# Patient Record
Sex: Female | Born: 1950 | Race: White | Hispanic: No | Smoking: Former smoker
Health system: Southern US, Community
[De-identification: ages and names within clinical notes are randomized; demographics above are authoritative.]

## PROBLEM LIST (undated history)

## (undated) DIAGNOSIS — T4145XA Adverse effect of unspecified anesthetic, initial encounter: Secondary | ICD-10-CM

## (undated) DIAGNOSIS — C50912 Malignant neoplasm of unspecified site of left female breast: Secondary | ICD-10-CM

## (undated) DIAGNOSIS — I10 Essential (primary) hypertension: Secondary | ICD-10-CM

## (undated) DIAGNOSIS — E785 Hyperlipidemia, unspecified: Secondary | ICD-10-CM

## (undated) DIAGNOSIS — C801 Malignant (primary) neoplasm, unspecified: Secondary | ICD-10-CM

## (undated) DIAGNOSIS — F329 Major depressive disorder, single episode, unspecified: Secondary | ICD-10-CM

## (undated) DIAGNOSIS — Z9221 Personal history of antineoplastic chemotherapy: Secondary | ICD-10-CM

## (undated) DIAGNOSIS — J449 Chronic obstructive pulmonary disease, unspecified: Secondary | ICD-10-CM

## (undated) DIAGNOSIS — C50919 Malignant neoplasm of unspecified site of unspecified female breast: Secondary | ICD-10-CM

## (undated) DIAGNOSIS — K219 Gastro-esophageal reflux disease without esophagitis: Secondary | ICD-10-CM

## (undated) DIAGNOSIS — Z923 Personal history of irradiation: Secondary | ICD-10-CM

## (undated) DIAGNOSIS — R110 Nausea: Secondary | ICD-10-CM

## (undated) DIAGNOSIS — N189 Chronic kidney disease, unspecified: Secondary | ICD-10-CM

## (undated) HISTORY — DX: Malignant neoplasm of unspecified site of left female breast: C50.912

## (undated) HISTORY — PX: BACK SURGERY: SHX140

## (undated) HISTORY — PX: OOPHORECTOMY: SHX86

## (undated) HISTORY — DX: Gastro-esophageal reflux disease without esophagitis: K21.9

## (undated) HISTORY — PX: BREAST BIOPSY: SHX20

## (undated) HISTORY — DX: Hyperlipidemia, unspecified: E78.5

## (undated) HISTORY — PX: CHOLECYSTECTOMY: SHX55

## (undated) HISTORY — DX: Essential (primary) hypertension: I10

## (undated) HISTORY — PX: TONSILLECTOMY: SUR1361

## (undated) HISTORY — PX: ABDOMINAL HYSTERECTOMY: SHX81

## (undated) HISTORY — DX: Major depressive disorder, single episode, unspecified: F32.9

## (undated) HISTORY — DX: Malignant (primary) neoplasm, unspecified: C80.1

---

## 1993-03-10 DIAGNOSIS — F32A Depression, unspecified: Secondary | ICD-10-CM

## 1993-03-10 HISTORY — DX: Depression, unspecified: F32.A

## 2001-06-09 ENCOUNTER — Other Ambulatory Visit: Admission: RE | Admit: 2001-06-09 | Discharge: 2001-06-09 | Payer: Self-pay | Admitting: Obstetrics & Gynecology

## 2001-11-19 ENCOUNTER — Other Ambulatory Visit: Admission: RE | Admit: 2001-11-19 | Discharge: 2001-11-19 | Payer: Self-pay | Admitting: Obstetrics & Gynecology

## 2001-12-21 ENCOUNTER — Ambulatory Visit (HOSPITAL_COMMUNITY): Admission: RE | Admit: 2001-12-21 | Discharge: 2001-12-21 | Payer: Self-pay | Admitting: Obstetrics & Gynecology

## 2001-12-27 ENCOUNTER — Encounter: Payer: Self-pay | Admitting: Obstetrics & Gynecology

## 2001-12-27 ENCOUNTER — Ambulatory Visit (HOSPITAL_COMMUNITY): Admission: RE | Admit: 2001-12-27 | Discharge: 2001-12-27 | Payer: Self-pay | Admitting: Obstetrics & Gynecology

## 2002-03-10 DIAGNOSIS — T8859XA Other complications of anesthesia, initial encounter: Secondary | ICD-10-CM

## 2002-03-10 HISTORY — DX: Other complications of anesthesia, initial encounter: T88.59XA

## 2003-08-02 ENCOUNTER — Other Ambulatory Visit: Admission: RE | Admit: 2003-08-02 | Discharge: 2003-08-02 | Payer: Self-pay | Admitting: Obstetrics and Gynecology

## 2004-04-23 ENCOUNTER — Ambulatory Visit (HOSPITAL_COMMUNITY): Admission: RE | Admit: 2004-04-23 | Discharge: 2004-04-23 | Payer: Self-pay | Admitting: Obstetrics & Gynecology

## 2004-12-05 ENCOUNTER — Other Ambulatory Visit: Admission: RE | Admit: 2004-12-05 | Discharge: 2004-12-05 | Payer: Self-pay | Admitting: Unknown Physician Specialty

## 2005-02-01 ENCOUNTER — Emergency Department (HOSPITAL_COMMUNITY): Admission: EM | Admit: 2005-02-01 | Discharge: 2005-02-01 | Payer: Self-pay | Admitting: Emergency Medicine

## 2008-05-17 ENCOUNTER — Ambulatory Visit: Payer: Self-pay

## 2009-05-22 ENCOUNTER — Ambulatory Visit: Payer: Self-pay | Admitting: Internal Medicine

## 2009-05-24 ENCOUNTER — Ambulatory Visit: Payer: Self-pay | Admitting: Family Medicine

## 2009-09-14 ENCOUNTER — Ambulatory Visit: Payer: Self-pay | Admitting: Unknown Physician Specialty

## 2009-09-17 LAB — PATHOLOGY REPORT

## 2009-12-11 ENCOUNTER — Ambulatory Visit: Payer: Self-pay | Admitting: Surgery

## 2010-03-30 ENCOUNTER — Encounter: Payer: Self-pay | Admitting: Obstetrics & Gynecology

## 2010-04-03 ENCOUNTER — Ambulatory Visit: Payer: Self-pay | Admitting: Family Medicine

## 2010-04-14 ENCOUNTER — Ambulatory Visit: Payer: Self-pay | Admitting: Family Medicine

## 2010-06-04 ENCOUNTER — Ambulatory Visit: Payer: Self-pay | Admitting: Surgery

## 2011-03-11 HISTORY — PX: COLONOSCOPY: SHX174

## 2011-06-19 ENCOUNTER — Ambulatory Visit: Payer: Self-pay | Admitting: Family Medicine

## 2012-06-23 ENCOUNTER — Ambulatory Visit: Payer: Self-pay | Admitting: Family Medicine

## 2013-03-10 HISTORY — PX: OTHER SURGICAL HISTORY: SHX169

## 2014-03-10 DIAGNOSIS — Z923 Personal history of irradiation: Secondary | ICD-10-CM

## 2014-03-10 DIAGNOSIS — C50919 Malignant neoplasm of unspecified site of unspecified female breast: Secondary | ICD-10-CM

## 2014-03-10 HISTORY — PX: REDUCTION MAMMAPLASTY: SUR839

## 2014-03-10 HISTORY — DX: Malignant neoplasm of unspecified site of unspecified female breast: C50.919

## 2014-03-10 HISTORY — PX: BREAST LUMPECTOMY: SHX2

## 2014-03-10 HISTORY — DX: Personal history of irradiation: Z92.3

## 2014-06-27 ENCOUNTER — Ambulatory Visit: Admit: 2014-06-27 | Disposition: A | Discharge status: Home / Self Care | Payer: Self-pay | Admitting: Family Medicine

## 2014-06-27 ENCOUNTER — Encounter: Payer: Self-pay | Admitting: Family Medicine

## 2014-07-04 ENCOUNTER — Ambulatory Visit
Admit: 2014-07-04 | Disposition: A | Discharge status: Home / Self Care | Payer: Self-pay | Attending: Family Medicine | Admitting: Family Medicine

## 2014-07-05 ENCOUNTER — Ambulatory Visit
Admit: 2014-07-05 | Disposition: A | Discharge status: Home / Self Care | Payer: Self-pay | Attending: Family Medicine | Admitting: Family Medicine

## 2014-07-05 ENCOUNTER — Other Ambulatory Visit: Payer: Self-pay

## 2014-07-06 LAB — SURGICAL PATHOLOGY

## 2014-07-11 ENCOUNTER — Encounter: Payer: Self-pay | Admitting: General Surgery

## 2014-07-11 ENCOUNTER — Other Ambulatory Visit: Payer: BLUE CROSS/BLUE SHIELD

## 2014-07-11 ENCOUNTER — Ambulatory Visit (INDEPENDENT_AMBULATORY_CARE_PROVIDER_SITE_OTHER): Payer: BLUE CROSS/BLUE SHIELD | Admitting: General Surgery

## 2014-07-11 VITALS — BP 124/80 | HR 78 | Resp 12 | Ht 65.0 in | Wt 218.0 lb

## 2014-07-11 DIAGNOSIS — N6320 Unspecified lump in the left breast, unspecified quadrant: Secondary | ICD-10-CM

## 2014-07-11 DIAGNOSIS — N63 Unspecified lump in breast: Secondary | ICD-10-CM | POA: Diagnosis not present

## 2014-07-11 NOTE — Progress Notes (Signed)
Patient ID: Madison Christensen, female   DOB: February 22, 1951, 64 y.o.   MRN: [id number]  Chief Complaint  Patient presents with  . Other    Breast cancer    HPI Madison Christensen is a 64 y.o. female who presents for a breast evaluation. The most recent mammogram was done on 06/27/14 added views on 07/04/14  and left breast core biopsy done on 07/05/14. Patient does perform regular self breast checks and gets regular mammograms done.  She did not have mammograms in 2013/05/30 as her husband died in April 29, 2022 of that year after a 4/2 year back to was metastatic lung cancer.  The patient is accompanied today by her sister, Madison Christensen and her son, Madison Christensen.    HPI  Past Medical History  Diagnosis Date  . Hypertension   . Hyperlipidemia   . Depression   . GERD (gastroesophageal reflux disease)   . Cancer     skin     Past Surgical History  Procedure Laterality Date  . Back surgery    . Tonsillectomy    . Skin cancer  removal  30-May-2013    nose   . Colonoscopy  05-31-2011    Dr. Vira Agar    Family History  Problem Relation Age of Onset  . Breast cancer Mother 36       . Breast cancer Cousin 68         Social History History  Substance Use Topics  . Smoking status: Former Smoker -- 1.00 packs/day for 2 years    Types: Cigarettes  . Smokeless tobacco: Not on file  . Alcohol Use: No    Allergies  Allergen Reactions  . Sulfa Antibiotics Hives    Current Outpatient Prescriptions  Medication Sig Dispense Refill  . cetirizine (ZYRTEC) 10 MG tablet Take 10 mg by mouth at bedtime.  3  . KLOR-CON M20 20 MEQ tablet Take 20 mEq by mouth daily.  4  . omeprazole (PRILOSEC) 40 MG capsule Take 40 mg by mouth daily.  4  . pravastatin (PRAVACHOL) 40 MG tablet Take 40 mg by mouth at bedtime.  3  . sertraline (ZOLOFT) 100 MG tablet Take 150 mg by mouth daily.  4  . triamterene-hydrochlorothiazide (MAXZIDE) 75-50 MG per tablet Take 1 tablet by mouth daily.  4   No current facility-administered  medications for this visit.    Review of Systems Review of Systems  Constitutional: Negative.   Respiratory: Negative.   Cardiovascular: Negative.     Blood pressure 124/80, pulse 78, resp. rate 12, height 5' 5"  (1.651 m), weight 98.884 kg (218 lb).  Physical Exam Physical Exam  Constitutional: She is oriented to person, place, and time. She appears well-developed and well-nourished.  Eyes: Conjunctivae are normal. No scleral icterus.  Neck: Neck supple.  Cardiovascular: Normal rate, regular rhythm and normal heart sounds.   Pulmonary/Chest: Effort normal and breath sounds normal. Right breast exhibits no inverted nipple, no mass, no nipple discharge, no skin change and no tenderness. Left breast exhibits no inverted nipple, no nipple discharge, no skin change and no tenderness.    Thickening at 11 o'clock left breast   Lymphadenopathy:    She has no cervical adenopathy.    She has no axillary adenopathy.  Neurological: She is alert and oriented to person, place, and time.  Skin: Skin is warm and dry.    Data Reviewed Ultrasound examination of the left breast was done to assess for the ability to locate the biopsy  site for wide excision. At the 11:00 position, 10 cm from the nipple a hypoechoic mass measuring 0.84 x 1.05 x 1.12 with evidence of a marking clip  is identified corresponding to the biopsy site.  Biopsy results showed invasive ductal carcinoma, no special type, grade 3. Lymphovascular involvement was noted. ER negative, PR negative, HER-2/neu overexpressing. ( 3+).  Screening mammograms dated 06/27/2014 showed a new density in the upper inner aspect of the left breast.  Focal spot compression views and ultrasound dated 07/04/2014 showed a persistent density and ultrasound showed a 8 x 7 x 10 mm abnormality for which biopsy was recommended. BI-RADS-5.  Biopsy was completed 07/05/2014.  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     Plan    The patient has tripled D breasts, and started out the conversation with the idea of bilateral mastectomy. It was explained that contralateral mastectomy adds no survival advantage. She appreciated that were surgery to be done on one side some sort of balancing procedure would be needed on the contralateral side.  Breast conservation and mastectomy were presented as equivalent procedures. The role for plastic surgery involvement was discussed. Considering her small tumor size and likelihood that she will benefit from adjuvant chemotherapy and targeted HER-2/neu therapy, I would advise against a combined procedure on both breast, rather focusing on the left breast, likely considering wide excision with immediate breast reduction by the plastic surgery service to minimize delay in initiating adjuvant therapy.  The patient is amenable to meet with plastic surgery service and medical oncology assessment further determination of role of adjuvant therapy based on the ER/PR negative tumor and evidence of lymphovascular invasion.  The patient was offered the opportunity for second surgical opinion locally or at the [place] level. (Her husband was cared [facility] at [facility] with his metastatic lung cancer) .  She'll be contacted regarding evaluation times with the above-mentioned subspecialists services.     PCP:  Cranford Mon, Richard  Ref. [address]  Hervey Ard W 07/12/2014, 6:43 AM

## 2014-07-12 ENCOUNTER — Telehealth: Payer: Self-pay | Admitting: *Deleted

## 2014-07-12 ENCOUNTER — Encounter: Payer: Self-pay | Admitting: General Surgery

## 2014-07-12 DIAGNOSIS — N6320 Unspecified lump in the left breast, unspecified quadrant: Secondary | ICD-10-CM | POA: Insufficient documentation

## 2014-07-12 DIAGNOSIS — C50912 Malignant neoplasm of unspecified site of left female breast: Secondary | ICD-10-CM

## 2014-07-12 NOTE — Telephone Encounter (Signed)
-----   Message from Robert Bellow, MD sent at 07/11/2014  9:32 PM EDT ----- Please arrange evaluation w/ Dr. Tula Nakayama re: breast reduction during wide excision, SLN biopsy. Please arrange for evaluation w medical oncology re: post surgery treatment for her 2 neu positive tumor (stage 1) Thanks.

## 2014-07-12 NOTE — Telephone Encounter (Signed)
Patient called the office back regarding her appointments. She is aware that [place] office will be in touch with her. Patient also requested to see Dr.Choksi, so I gave her the number to call and get that changed.

## 2014-07-12 NOTE — Telephone Encounter (Signed)
Message left on home, work, and cell numbers for patient to call the office.  Records forwarded to Dr. Edwin Dada office and received per Roselyn Reef. Their office will be in contact with patient regarding appointment.   Patient has been scheduled for an appointment with Dr. Ma Hillock at the [facility] for 07-19-14 at 11 am. Their office will be mailing out new patient paperwork for her to complete and bring to appointment.

## 2014-07-19 ENCOUNTER — Encounter (INDEPENDENT_AMBULATORY_CARE_PROVIDER_SITE_OTHER): Payer: Self-pay

## 2014-07-19 ENCOUNTER — Ambulatory Visit: Payer: BLUE CROSS/BLUE SHIELD | Admitting: Internal Medicine

## 2014-07-19 ENCOUNTER — Inpatient Hospital Stay: Payer: BLUE CROSS/BLUE SHIELD | Attending: Oncology | Admitting: Oncology

## 2014-07-19 VITALS — BP 122/80 | HR 73 | Temp 96.4°F | Ht 65.0 in | Wt 220.0 lb

## 2014-07-19 DIAGNOSIS — Z171 Estrogen receptor negative status [ER-]: Secondary | ICD-10-CM | POA: Diagnosis not present

## 2014-07-19 DIAGNOSIS — F419 Anxiety disorder, unspecified: Secondary | ICD-10-CM | POA: Insufficient documentation

## 2014-07-19 DIAGNOSIS — C50912 Malignant neoplasm of unspecified site of left female breast: Secondary | ICD-10-CM | POA: Diagnosis present

## 2014-07-19 DIAGNOSIS — F17210 Nicotine dependence, cigarettes, uncomplicated: Secondary | ICD-10-CM

## 2014-07-19 DIAGNOSIS — K219 Gastro-esophageal reflux disease without esophagitis: Secondary | ICD-10-CM | POA: Insufficient documentation

## 2014-07-19 DIAGNOSIS — E785 Hyperlipidemia, unspecified: Secondary | ICD-10-CM

## 2014-07-19 DIAGNOSIS — I10 Essential (primary) hypertension: Secondary | ICD-10-CM | POA: Insufficient documentation

## 2014-07-19 DIAGNOSIS — Z85828 Personal history of other malignant neoplasm of skin: Secondary | ICD-10-CM | POA: Diagnosis not present

## 2014-07-19 DIAGNOSIS — Z803 Family history of malignant neoplasm of breast: Secondary | ICD-10-CM

## 2014-07-22 DIAGNOSIS — E876 Hypokalemia: Secondary | ICD-10-CM | POA: Insufficient documentation

## 2014-07-22 DIAGNOSIS — F32A Depression, unspecified: Secondary | ICD-10-CM | POA: Insufficient documentation

## 2014-07-22 DIAGNOSIS — F329 Major depressive disorder, single episode, unspecified: Secondary | ICD-10-CM | POA: Insufficient documentation

## 2014-07-27 ENCOUNTER — Telehealth: Payer: Self-pay | Admitting: *Deleted

## 2014-07-27 DIAGNOSIS — C50912 Malignant neoplasm of unspecified site of left female breast: Secondary | ICD-10-CM

## 2014-07-27 NOTE — Telephone Encounter (Signed)
Patient was contacted today. She confirms that she wants Dr. Bary Castilla to complete her breast surgery. Also, confirms that she wants a left breast lumpectomy with SLN biopsy.   This patient's surgery has been scheduled for 08-03-14 at [facility].

## 2014-07-29 ENCOUNTER — Other Ambulatory Visit: Payer: Self-pay | Admitting: General Surgery

## 2014-07-29 DIAGNOSIS — C50912 Malignant neoplasm of unspecified site of left female breast: Secondary | ICD-10-CM

## 2014-07-29 NOTE — H&P (Signed)
Patient ID: Madison Christensen, female DOB: 1950-06-15, 64 y.o. MRN: [id number]  Chief Complaint  Patient presents with  . Other    Breast cancer    HPI ASIANNA Christensen is a 64 y.o. female who presents for a breast evaluation. The most recent mammogram was done on 06/27/14 added views on 07/04/14 and left breast core biopsy done on 07/05/14. Patient does perform regular self breast checks and gets regular mammograms done. She did not have mammograms in 2013/05/06 as her husband died in Apr 05, 2022 of that year after a 4/2 year back to was metastatic lung cancer.  The patient is accompanied today by her sister, Madison Christensen and her son, Madison Christensen.   HPI  Past Medical History  Diagnosis Date  . Hypertension   . Hyperlipidemia   . Depression   . GERD (gastroesophageal reflux disease)   . Cancer     skin     Past Surgical History  Procedure Laterality Date  . Back surgery    . Tonsillectomy    . Skin cancer removal  05/06/13    nose   . Colonoscopy  May 07, 2011    Dr. Vira Agar    Family History  Problem Relation Age of Onset  . Breast cancer Mother 73      . Breast cancer Cousin 47        Social History History  Substance Use Topics  . Smoking status: Former Smoker -- 1.00 packs/day for 2 years    Types: Cigarettes  . Smokeless tobacco: Not on file  . Alcohol Use: No    Allergies  Allergen Reactions  . Sulfa Antibiotics Hives    Current Outpatient Prescriptions  Medication Sig Dispense Refill  . cetirizine (ZYRTEC) 10 MG tablet Take 10 mg by mouth at bedtime.  3  . KLOR-CON M20 20 MEQ tablet Take 20 mEq by mouth daily.  4  . omeprazole (PRILOSEC) 40 MG capsule Take 40 mg by mouth daily.  4  . pravastatin (PRAVACHOL) 40 MG tablet Take 40 mg by mouth at bedtime.  3  . sertraline (ZOLOFT) 100 MG tablet Take 150 mg by mouth daily.  4    . triamterene-hydrochlorothiazide (MAXZIDE) 75-50 MG per tablet Take 1 tablet by mouth daily.  4   No current facility-administered medications for this visit.    Review of Systems Review of Systems  Constitutional: Negative.  Respiratory: Negative.  Cardiovascular: Negative.    Blood pressure 124/80, pulse 78, resp. rate 12, height 5' 5"  (1.651 m), weight 98.884 kg (218 lb).  Physical Exam Physical Exam  Constitutional: She is oriented to person, place, and time. She appears well-developed and well-nourished.  Eyes: Conjunctivae are normal. No scleral icterus.  Neck: Neck supple.  Cardiovascular: Normal rate, regular rhythm and normal heart sounds.  Pulmonary/Chest: Effort normal and breath sounds normal. Right breast exhibits no inverted nipple, no mass, no nipple discharge, no skin change and no tenderness. Left breast exhibits no inverted nipple, no nipple discharge, no skin change and no tenderness.    Thickening at 11 o'clock left breast  Lymphadenopathy:   She has no cervical adenopathy.   She has no axillary adenopathy.  Neurological: She is alert and oriented to person, place, and time.  Skin: Skin is warm and dry.    Data Reviewed Ultrasound examination of the left breast was done to assess for the ability to locate the biopsy site for wide excision. At the 11:00 position, 10 cm from the nipple a hypoechoic mass  measuring 0.84 x 1.05 x 1.12 with evidence of a marking clip is identified corresponding to the biopsy site.  Biopsy results showed invasive ductal carcinoma, no special type, grade 3. Lymphovascular involvement was noted. ER negative, PR negative, HER-2/neu overexpressing. ( 3+).  Screening mammograms dated 06/27/2014 showed a new density in the upper inner aspect of the left breast.  Focal spot compression views and ultrasound dated 07/04/2014 showed a persistent density and ultrasound showed a 8 x 7 x 10 mm abnormality for which biopsy was  recommended. BI-RADS-5.  Biopsy was completed 07/05/2014.  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    Plan    The patient has tripled D breasts, and started out the conversation with the idea of bilateral mastectomy. It was explained that contralateral mastectomy adds no survival advantage. She appreciated that were surgery to be done on one side some sort of balancing procedure would be needed on the contralateral side.  Breast conservation and mastectomy were presented as equivalent procedures. The role for plastic surgery involvement was discussed. Considering her small tumor size and likelihood that she will benefit from adjuvant chemotherapy and targeted HER-2/neu therapy, I would advise against a combined procedure on both breast, rather focusing on the left breast, likely considering wide excision with immediate breast reduction by the plastic surgery service to minimize delay in initiating adjuvant therapy.  The patient is amenable to meet with plastic surgery service and medical oncology assessment further determination of role of adjuvant therapy based on the ER/PR negative tumor and evidence of lymphovascular invasion.  The patient was offered the opportunity for second surgical opinion locally or at the [place] level. (Her husband was cared for at [facility] with his metastatic lung cancer) .  She'll be contacted regarding evaluation times with the above-mentioned subspecialists services.        The patient met with the plastic surgery service on 07/27/2014. Plans for wide excision, sentinel node biopsy for confirmation of negative margins followed by reduction mammoplasty if chemotherapy is not indicated a sparse planned at present. She is a ER/PR negative tumor, HER-2/neu over expressed. In discussion with Blain Pais, M.D. from medical oncology she would be a candidate for adjuvant chemotherapy if the tumor is greater than 1 cm in diameter  based on the HER-2/neu status.

## 2014-07-30 ENCOUNTER — Encounter: Payer: Self-pay | Admitting: Oncology

## 2014-07-30 DIAGNOSIS — C50912 Malignant neoplasm of unspecified site of left female breast: Secondary | ICD-10-CM

## 2014-07-30 HISTORY — DX: Malignant neoplasm of unspecified site of left female breast: C50.912

## 2014-07-30 NOTE — Progress Notes (Signed)
[place] @ [facility] [phone]  Fax:([phone]) [place] OB: [birth year]  MR#: [id number]  ZHG#:[id number]  Patient Care Team: Janine Ores Cranford Mon., MD as PCP - General (Family Medicine) Robert Bellow, MD as Consulting Physician (General Surgery) Nicholaus Bloom, MD as Consulting Physician (Plastic Surgery) Leonie Green, MD as Consulting Physician (Surgery)  CHIEF COMPLAINT:  Chief Complaint  Patient presents with  . New Evaluation    VISIT DIAGNOSIS:     ICD-9-CM ICD-10-CM   1. Carcinoma of left breast 174.9 C50.912      Oncology History   1.  Abnormal mammogram in April of 2016 followed by ultrasound and based on clinical examination 1 cm tumor.  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  (July 05, 2014)     Carcinoma of left breast   07/05/2014 Initial Diagnosis Carcinoma of left breast    No flowsheet data found.  INTERVAL HISTORY:    Madison Christensen is a 64 y.o. female who presents for a breast evaluation. The most recent mammogram was done on 06/27/14 added views on 07/04/14 and left breast core biopsy done on 07/05/14. Patient does perform regular self breast checks and gets regular mammograms done. She did not have mammograms in May 23, 2013 as her husband died in Apr 22, 2022 of that year after a 4/2 year back to was metastatic lung cancer. Patient underwent ultrasound followed by core biopsy and was positive for invasive mammary carcinoma which was estrogen progesterone receptor negative.  And HER-2/neu positive by IHC.  Patient has been evaluated by Dr. Rochel Brome and by Dr. Bary Castilla.  An appointment with Dr. Tula Nakayama  was pending. Patient desires bilateral mastectomy right away with reconstructive surgery.  And extremely anxious. Here for further evaluation. REVIEW OF SYSTEM: Gen. status: Patient is in good performance status.  But  extremely apprehensive and agitated. HEENT: No headache no dizziness.  No difficulty swallowing. Lungs: No cough or shortness of breath Cardiac: No chest pain.  No paroxysmal nocturnal dyspnea GI:  no nausea no vomiting no diarrhea . Musculoskeletal system: No bony pain skin: No rash Neurological system: No headache no dizziness.  No other focal symptoms GU: No dysuria or hematuria All other systems have been reviewed As per HPI. Otherwise, a complete review of systems is negatve.  PAST MEDICAL HISTORY: Past Medical History  Diagnosis Date  . Hypertension   . Hyperlipidemia   . Depression   . GERD (gastroesophageal reflux disease)   . Cancer     skin   . Carcinoma of left breast 07/30/2014    PAST SURGICAL HISTORY: Past Surgical History  Procedure Laterality Date  . Back surgery    . Tonsillectomy    . Skin cancer  removal  05-23-2013    nose   . Colonoscopy  May 24, 2011    Dr. Vira Agar    FAMILY HISTORY Family History  Problem Relation Age of Onset  . Breast cancer Mother 50       . Breast cancer Cousin 62             ADVANCED DIRECTIVES: Patient does not have advance care directive   HEALTH MAINTENANCE: History  Substance Use Topics  . Smoking status: Former Smoker -- 1.00 packs/day for 2 years    Types: Cigarettes  . Smokeless tobacco: Not on file  . Alcohol Use: No    :  Allergies  Allergen Reactions  .  Sulfa Antibiotics Hives and Rash    Current Outpatient Prescriptions  Medication Sig Dispense Refill  . KLOR-CON M20 20 MEQ tablet Take 20 mEq by mouth daily.  4  . omeprazole (PRILOSEC) 40 MG capsule Take 40 mg by mouth daily.  4  . pravastatin (PRAVACHOL) 40 MG tablet Take 40 mg by mouth at bedtime.  3  . sertraline (ZOLOFT) 100 MG tablet Take 150 mg by mouth daily.  4  . triamterene-hydrochlorothiazide (MAXZIDE) 75-50 MG per tablet Take 1 tablet by mouth daily.  4  . cetirizine (ZYRTEC) 10 MG tablet Take 10 mg by mouth at bedtime.  3   No current  facility-administered medications for this visit.    OBJECTIVE: PHYSICAL EXAM: GENERAL:  Well developed, well nourished, sitting comfortably in the exam room in no acute distress. MENTAL STATUS:  Alert and oriented to person, place and time. HEAD:  .  Normocephalic, atraumatic, face symmetric, no Cushingoid features. EYES: .  Pupils equal round and reactive to light and accomodation.  No conjunctivitis or scleral icterus. ENT:  Oropharynx clear without lesion.  Tongue normal. Mucous membranes moist.  RESPIRATORY:  Clear to auscultation without rales, wheezes or rhonchi. CARDIOVASCULAR:  Regular rate and rhythm without murmur, rub or gallop. BREAST:  Right breast without masses, skin changes or nipple discharge.  Left breast without masses,recent  biopsy and wound is healing well. skin changes or nipple discharge. ABDOMEN:  Soft, non-tender, with active bowel sounds, and no hepatosplenomegaly.  No masses. BACK:  No CVA tenderness.  No tenderness on percussion of the back or rib cage. SKIN:  No rashes, ulcers or lesions. EXTREMITIES: No edema, no skin discoloration or tenderness.  No palpable cords. LYMPH NODES: No palpable cervical, supraclavicular, axillary or inguinal adenopathy  NEUROLOGICAL: Unremarkable. PSYCH:  Appropriate.  Filed Vitals:   07/19/14 1522  BP: 122/80  Pulse: 73  Temp: 96.4 F (35.8 C)     Body mass index is 36.61 kg/(m^2).    ECOG FS:0 - Asymptomatic  LAB RESULTS:     STUDIES: [place] Core Biopsy R/s  07/18/2014   ADDENDUM REPORT: 07/10/2014 17:11  ADDENDUM: Final pathology demonstrates INVASIVE DUCTAL CARCINOMA WITH LYMPHOVASCULAR INVASION.  Histology correlates with imaging findings.  These results were relayed to the patient by Sallyanne Havers.  Recommend surgery/oncology consultation which will be arranged by the patient's primary provider.   Electronically Signed   By: Margarette Canada M.D.   On: 07/10/2014 17:11   07/10/2014   CLINICAL DATA:  64 year old female for  biopsy of an indeterminate left breast mass  EXAM: ULTRASOUND GUIDED LEFT BREAST CORE NEEDLE BIOPSY  COMPARISON:  Previous exam(s).  PROCEDURE: I met with the patient and we discussed the procedure of ultrasound-guided biopsy, including benefits and alternatives. We discussed the high likelihood of a successful procedure. We discussed the risks of the procedure including infection, bleeding, tissue injury, clip migration, and inadequate sampling. Informed written consent was given. The usual time-out protocol was performed immediately prior to the procedure.  Using sterile technique and 2% Lidocaine as local anesthetic, under direct ultrasound visualization, a 12 gauge vacuum-assisted device was used to perform biopsy of an indeterminate left breast mass at 11 o'clock, 7 cm from the nippleusing a lateral to medial approach. At the conclusion of the procedure, a coil shaped tissue marker clip was deployed into the biopsy cavity. Follow-up 2-view mammogram was performed and dictated separately.  IMPRESSION: Ultrasound-guided biopsy of an indeterminate left breast mass at 11 o'clock. No apparent complications.  Electronically Signed: By: Pamelia Hoit M.D. On: 07/05/2014 08:56   US Breast Complete Uni Left Inc Axilla  07/12/2014   Ultrasound examination of the left breast was done to assess for the  ability to locate the biopsy site for wide excision. At the 11:00  position, 10 cm from the nipple a hypoechoic mass measuring 0.84 x 1.05 x  1.12 with evidence of a marking clip  is identified corresponding to the  biopsy site.  US Breast Ltd Uni Left Inc Axilla  07/04/2014   CLINICAL DATA:  Screening callback for questioned left breast mass  EXAM: DIGITAL DIAGNOSTIC left MAMMOGRAM WITH 3D TOMOSYNTHESIS WITH CAD  ULTRASOUND left BREAST  COMPARISON:  [facility], most recent previous screening mammogram prior to 06/26/2004 is dated 06/23/2012  ACR Breast Density Category b: There are scattered areas of fibroglandular density.   FINDINGS: Additional views today confirm the presence of a spiculated mass left breast 11 o'clock location corresponding to the screening mammographic finding.  Mammographic images were processed with CAD.  Targeted ultrasound is performed, showing a hypoechoic irregular spiculated mass left breast 1100 location 7 cm from the nipple measuring 10 x 8 x 7 mm. This corresponds to the mammographic finding.  No left axillary lymphadenopathy.  IMPRESSION: Suspicious left breast mass 11 o'clock location. Ultrasound-guided core biopsy will be scheduled at the patient's convenience and the referring physician's office notified.  RECOMMENDATION: Left breast ultrasound-guided core biopsy  I have discussed the findings and recommendations with the patient. Results were also provided in writing at the conclusion of the visit. If applicable, a reminder letter will be sent to the patient regarding the next appointment.  BI-RADS CATEGORY  5: Highly suggestive of malignancy.   Electronically Signed   By: Conchita Paris M.D.   On: 07/04/2014 11:35   Mm Post Image Left ([facility] Hx)  07/05/2014   CLINICAL DATA:  64 year old female status post ultrasound-guided left breast biopsy  EXAM: DIAGNOSTIC LEFT MAMMOGRAM POST ULTRASOUND BIOPSY  COMPARISON:  Previous exam(s).  FINDINGS: Mammographic images were obtained following ultrasound guided biopsy of an indeterminate left breast mass at 11 o'clock. Post biopsy mammogram demonstrates the coil shaped biopsy marker to be in the expected position within the left breast mass at 11 o'clock.  IMPRESSION: Satisfactory marker placement post ultrasound-guided left breast biopsy.  Final Assessment: Post Procedure Mammograms for Marker Placement   Electronically Signed   By: Pamelia Hoit M.D.   On: 07/05/2014 08:56   Mm Dig Added Views W/tomo Left ([facility] Hx)  07/04/2014   CLINICAL DATA:  Screening callback for questioned left breast mass  EXAM: DIGITAL DIAGNOSTIC left MAMMOGRAM WITH 3D TOMOSYNTHESIS  WITH CAD  ULTRASOUND left BREAST  COMPARISON:  [facility], most recent previous screening mammogram prior to 06/26/2004 is dated 06/23/2012  ACR Breast Density Category b: There are scattered areas of fibroglandular density.  FINDINGS: Additional views today confirm the presence of a spiculated mass left breast 11 o'clock location corresponding to the screening mammographic finding.  Mammographic images were processed with CAD.  Targeted ultrasound is performed, showing a hypoechoic irregular spiculated mass left breast 1100 location 7 cm from the nipple measuring 10 x 8 x 7 mm. This corresponds to the mammographic finding.  No left axillary lymphadenopathy.  IMPRESSION: Suspicious left breast mass 11 o'clock location. Ultrasound-guided core biopsy will be scheduled at the patient's convenience and the referring physician's office notified.  RECOMMENDATION: Left breast ultrasound-guided core biopsy  I have discussed the findings and recommendations with the  patient. Results were also provided in writing at the conclusion of the visit. If applicable, a reminder letter will be sent to the patient regarding the next appointment.  BI-RADS CATEGORY  5: Highly suggestive of malignancy.   Electronically Signed   By: Conchita Paris M.D.   On: 07/04/2014 11:35    ASSESSMENT AND PLAN:  1.CARCINOMA OF LEFT BREAST STAGE 1B DISEASE WITH 1 CM OF TUMOR ON  ULTRASOUND AND MAMMOGRAM. CORE BIOPSY ON 07/05/2014 SHOWS INVASIVE MAMMARY CARCINOMA ESTROGEN RECEPTOR NEGATIVE.  pROGESTERONE RECEPTOR NEGATIVE.  Her-2/NEU POSITIVE BY  [facility]   1/I had prolonged discussion with patient and her friend.  Patient has a strong desire a bilateral mastectomy and reconstructive surgery    I explained in detail that patient may need systemic therapy including chemotherapy because patient has number of poor prognostic factor including negative estrogen and progesterone receptor and HER-2/neu positive tumor.  There is lymphovascular invasion. If  bilateral mastectomy and reconstructive surgery is undertaken initially than chemotherapy if needed will be very delayed and patient may lose opportunity for systemic control of the disease.. I advised patient that best approach would be initial lumpectomy and sentinel lymph node evaluation   Or   Mastectomy and sentinel lymph node evaluation   then evaluation for the patient to see if see needs chemotherapy . If She needs chemotherapy that should be done followed by what ever surgery that is needed and desired by the patient and agreed upon by Dr. Nicholaus Bloom and surgeon. Patient to make that decision after appointment with plastic surgeon. Oletta Cohn our nurse navigator would be in constant touch with the patient and keep me informed about patient's progress. After initial surgery patient will be reevaluated to decide on need for chemotherapy. Patient agreed with this approach All lab data, pathology data, and available mammogram and ultrasound have been reviewed  Patient expressed understanding and was in agreement with this plan. She also understands that She can call clinic at any time with any questions, concerns, or complaints.    No matching staging information was found for the patient.  Forest Gleason, MD   07/30/2014 11:58 AM

## 2014-07-31 ENCOUNTER — Inpatient Hospital Stay
Admission: RE | Admit: 2014-07-31 | Discharge: 2014-07-31 | Disposition: A | Discharge status: Home / Self Care | Payer: BLUE CROSS/BLUE SHIELD | Source: Ambulatory Visit

## 2014-07-31 HISTORY — DX: Adverse effect of unspecified anesthetic, initial encounter: T41.45XA

## 2014-07-31 NOTE — Patient Instructions (Signed)
  Your procedure is scheduled on: Aug 03, 2014 Report to the Radiology Desk in the [facility] at 9:30 am   Remember: Instructions that are not followed completely may result in serious medical risk, up to and including death, or upon the discretion of your surgeon and anesthesiologist your surgery may need to be rescheduled.    _x___ 1. Do not eat food or drink liquids after midnight. No gum chewing or hard candies.     _x___ 2. No Alcohol for 24 hours before or after surgery.   ____ 3. Bring all medications with you on the day of surgery if instructed.    __x_ 4. Notify your doctor if there is any change in your medical condition     (cold, fever, infections).     Do not wear jewelry, make-up, hairpins, clips or nail polish.  Do not wear lotions, powders, or perfumes. You may wear deodorant.  Do not shave 48 hours prior to surgery. Men may shave face and neck.  Do not bring valuables to the hospital.    [facility] is not responsible for any belongings or valuables.               Contacts, dentures or bridgework may not be worn into surgery.  Leave your suitcase in the car. After surgery it may be brought to your room.  For patients admitted to the hospital, discharge time is determined by your                treatment team.   Patients discharged the day of surgery will not be allowed to drive home.   Please read over the following fact sheets that you were given:   [place] preparing for surgery   __x__ Take these medicines the morning of surgery with A SIP OF WATER:    1. omeprazole (PRILOSEC)  2. pravastatin (PRAVACHOL)   3. sertraline (ZOLOFT)   ____ Fleet Enema (as directed)   ____ Use CHG Soap as directed  ____ Use inhalers on the day of surgery  ____ Stop metformin 2 days prior to surgery    ____ Take 1/2 of usual insulin dose the night before surgery and none on the morning of surgery.   ____ Stop Coumadin/Plavix/aspirin on Does not apply __x__ Stop  Anti-inflammatories on today, OK to take Tylenol for pain.  ____ Stop supplements until after surgery.    ____ Bring C-Pap to the hospital.

## 2014-07-31 NOTE — Patient Instructions (Addendum)
  Your procedure is scheduled on: Aug 03, 2014 Report to Radiology desk in [place] at 9:30 am on Aug 03, 2014.   Remember: Instructions that are not followed completely may result in serious medical risk, up to and including death, or upon the discretion of your surgeon and anesthesiologist your surgery may need to be rescheduled.    __x__ 1. Do not eat food or drink liquids after midnight. No gum chewing or hard candies.     __x__ 2. No Alcohol for 24 hours before or after surgery.   ____ 3. Bring all medications with you on the day of surgery if instructed.    __x_ 4. Notify your doctor if there is any change in your medical condition     (cold, fever, infections).     Do not wear jewelry, make-up, hairpins, clips or nail polish.  Do not wear lotions, powders, or perfumes. You may wear deodorant.  Do not shave 48 hours prior to surgery. Men may shave face and neck.  Do not bring valuables to the hospital.    [facility] is not responsible for any belongings or valuables.               Contacts, dentures or bridgework may not be worn into surgery.  Leave your suitcase in the car. After surgery it may be brought to your room.  For patients admitted to the hospital, discharge time is determined by your  treatment team.   Patients discharged the day of surgery will not be allowed to drive home.   Please read over the following fact sheets that you were given:      ____ Take these medicines the morning of surgery with A SIP OF WATER:    1. omeprazole (PRILOSEC)  2. sertraline (Zoloft)  3. Pravastatin   ____ Fleet Enema (as directed)   ____ Use CHG Soap as directed  ____ Use inhalers on the day of surgery  ____ Stop metformin 2 days prior to surgery    ____ Take 1/2 of usual insulin dose the night before surgery and none on the morning of surgery.   ____ Stop Coumadin/Plavix/aspirin on Does not apply.  ____ Stop Anti-inflammatories on today. May take Tylenol for  pain.   ____ Stop supplements until after surgery.    ____ Bring C-Pap to the hospital.

## 2014-08-01 ENCOUNTER — Encounter
Admission: RE | Admit: 2014-08-01 | Discharge: 2014-08-01 | Disposition: A | Discharge status: Home / Self Care | Payer: BLUE CROSS/BLUE SHIELD | Source: Ambulatory Visit | Attending: Anesthesiology | Admitting: Anesthesiology

## 2014-08-01 ENCOUNTER — Encounter: Payer: Self-pay | Admitting: General Surgery

## 2014-08-01 DIAGNOSIS — Z87891 Personal history of nicotine dependence: Secondary | ICD-10-CM | POA: Diagnosis not present

## 2014-08-01 DIAGNOSIS — Z171 Estrogen receptor negative status [ER-]: Secondary | ICD-10-CM | POA: Diagnosis not present

## 2014-08-01 DIAGNOSIS — C50212 Malignant neoplasm of upper-inner quadrant of left female breast: Secondary | ICD-10-CM | POA: Diagnosis not present

## 2014-08-01 DIAGNOSIS — F329 Major depressive disorder, single episode, unspecified: Secondary | ICD-10-CM | POA: Diagnosis not present

## 2014-08-01 DIAGNOSIS — I10 Essential (primary) hypertension: Secondary | ICD-10-CM | POA: Diagnosis not present

## 2014-08-01 DIAGNOSIS — C50912 Malignant neoplasm of unspecified site of left female breast: Secondary | ICD-10-CM | POA: Diagnosis present

## 2014-08-01 DIAGNOSIS — K219 Gastro-esophageal reflux disease without esophagitis: Secondary | ICD-10-CM | POA: Diagnosis not present

## 2014-08-01 LAB — SURGICAL PATHOLOGY

## 2014-08-01 LAB — POTASSIUM: Potassium: 3.6 mmol/L (ref 3.5–5.1)

## 2014-08-01 NOTE — OR Nursing (Signed)
Pt came into PAT for EKG and lab draw for potassium.  Pt given surgical scrub and preop instruction and reviewed.

## 2014-08-03 ENCOUNTER — Ambulatory Visit
Admission: RE | Admit: 2014-08-03 | Discharge: 2014-08-03 | Disposition: A | Discharge status: Home / Self Care | Payer: BLUE CROSS/BLUE SHIELD | Source: Ambulatory Visit | Attending: General Surgery | Admitting: General Surgery

## 2014-08-03 ENCOUNTER — Encounter
Admission: RE | Disposition: A | Discharge status: Home / Self Care | Payer: Self-pay | Source: Ambulatory Visit | Attending: General Surgery

## 2014-08-03 ENCOUNTER — Ambulatory Visit: Payer: BLUE CROSS/BLUE SHIELD | Admitting: Anesthesiology

## 2014-08-03 ENCOUNTER — Ambulatory Visit: Payer: BLUE CROSS/BLUE SHIELD

## 2014-08-03 DIAGNOSIS — C50912 Malignant neoplasm of unspecified site of left female breast: Secondary | ICD-10-CM

## 2014-08-03 DIAGNOSIS — R928 Other abnormal and inconclusive findings on diagnostic imaging of breast: Secondary | ICD-10-CM

## 2014-08-03 DIAGNOSIS — I10 Essential (primary) hypertension: Secondary | ICD-10-CM | POA: Insufficient documentation

## 2014-08-03 DIAGNOSIS — Z87891 Personal history of nicotine dependence: Secondary | ICD-10-CM | POA: Insufficient documentation

## 2014-08-03 DIAGNOSIS — C50212 Malignant neoplasm of upper-inner quadrant of left female breast: Secondary | ICD-10-CM | POA: Insufficient documentation

## 2014-08-03 DIAGNOSIS — K219 Gastro-esophageal reflux disease without esophagitis: Secondary | ICD-10-CM | POA: Insufficient documentation

## 2014-08-03 DIAGNOSIS — F329 Major depressive disorder, single episode, unspecified: Secondary | ICD-10-CM | POA: Insufficient documentation

## 2014-08-03 DIAGNOSIS — Z171 Estrogen receptor negative status [ER-]: Secondary | ICD-10-CM | POA: Insufficient documentation

## 2014-08-03 HISTORY — PX: BREAST LUMPECTOMY WITH AXILLARY LYMPH NODE DISSECTION: SHX5756

## 2014-08-03 HISTORY — PX: AXILLARY LYMPH NODE BIOPSY: SHX5737

## 2014-08-03 HISTORY — PX: SENTINEL NODE BIOPSY: SHX6608

## 2014-08-03 HISTORY — PX: BREAST SURGERY: SHX581

## 2014-08-03 SURGERY — BREAST LUMPECTOMY WITH AXILLARY LYMPH NODE DISSECTION
Anesthesia: General | Laterality: Left

## 2014-08-03 MED ORDER — BUPIVACAINE-EPINEPHRINE (PF) 0.5% -1:200000 IJ SOLN
INTRAMUSCULAR | Status: DC | PRN
  Administered 2014-08-03: 30 mL

## 2014-08-03 MED ORDER — FENTANYL CITRATE (PF) 100 MCG/2ML IJ SOLN
INTRAMUSCULAR | Status: AC
  Filled 2014-08-03: qty 2

## 2014-08-03 MED ORDER — ACETAMINOPHEN 10 MG/ML IV SOLN
INTRAVENOUS | Status: AC
  Filled 2014-08-03: qty 100

## 2014-08-03 MED ORDER — CHLORHEXIDINE GLUCONATE 4 % EX LIQD: 1.0000 "application " | Freq: Once | CUTANEOUS | Status: DC

## 2014-08-03 MED ORDER — MIDAZOLAM HCL 2 MG/2ML IJ SOLN
INTRAMUSCULAR | Status: DC | PRN
  Administered 2014-08-03: 2 mg via INTRAVENOUS

## 2014-08-03 MED ORDER — ACETAMINOPHEN 10 MG/ML IV SOLN
INTRAVENOUS | Status: DC | PRN
  Administered 2014-08-03: 1000 mg via INTRAVENOUS

## 2014-08-03 MED ORDER — EPHEDRINE SULFATE 50 MG/ML IJ SOLN
INTRAMUSCULAR | Status: DC | PRN
  Administered 2014-08-03 (×2): 10 mg via INTRAVENOUS

## 2014-08-03 MED ORDER — SODIUM CHLORIDE 0.9 % IJ SOLN
INTRAMUSCULAR | Status: DC | PRN
  Administered 2014-08-03: 12:00:00 via INTRAMUSCULAR

## 2014-08-03 MED ORDER — FENTANYL CITRATE (PF) 100 MCG/2ML IJ SOLN
INTRAMUSCULAR | Status: DC | PRN
  Administered 2014-08-03: 25 ug via INTRAVENOUS
  Administered 2014-08-03 (×2): 50 ug via INTRAVENOUS
  Administered 2014-08-03: 25 ug via INTRAVENOUS

## 2014-08-03 MED ORDER — ONDANSETRON HCL 4 MG/2ML IJ SOLN: 4.0000 mg | Freq: Once | INTRAMUSCULAR | Status: DC | PRN

## 2014-08-03 MED ORDER — KETOROLAC TROMETHAMINE 30 MG/ML IJ SOLN
INTRAMUSCULAR | Status: DC | PRN
  Administered 2014-08-03: 30 mg via INTRAVENOUS

## 2014-08-03 MED ORDER — PROPOFOL 10 MG/ML IV BOLUS
INTRAVENOUS | Status: DC | PRN
  Administered 2014-08-03: 150 mg via INTRAVENOUS

## 2014-08-03 MED ORDER — HYDROCODONE-ACETAMINOPHEN 5-325 MG PO TABS
1.0000 | ORAL_TABLET | ORAL | Status: DC | PRN
  Administered 2014-08-03: 2 via ORAL

## 2014-08-03 MED ORDER — PHENYLEPHRINE HCL 10 MG/ML IJ SOLN
INTRAMUSCULAR | Status: DC | PRN
  Administered 2014-08-03 (×2): 100 ug via INTRAVENOUS

## 2014-08-03 MED ORDER — SODIUM CHLORIDE 0.9 % IJ SOLN
INTRAMUSCULAR | Status: AC
  Filled 2014-08-03: qty 10

## 2014-08-03 MED ORDER — BUPIVACAINE-EPINEPHRINE (PF) 0.5% -1:200000 IJ SOLN
INTRAMUSCULAR | Status: AC
  Filled 2014-08-03: qty 30

## 2014-08-03 MED ORDER — METHYLENE BLUE 1 % INJ SOLN
INTRAMUSCULAR | Status: AC
  Filled 2014-08-03: qty 10

## 2014-08-03 MED ORDER — LIDOCAINE HCL (CARDIAC) 20 MG/ML IV SOLN
INTRAVENOUS | Status: DC | PRN
  Administered 2014-08-03: 30 mg via INTRAVENOUS

## 2014-08-03 MED ORDER — FENTANYL CITRATE (PF) 100 MCG/2ML IJ SOLN
25.0000 ug | INTRAMUSCULAR | Status: AC | PRN
  Administered 2014-08-03 (×6): 25 ug via INTRAVENOUS

## 2014-08-03 MED ORDER — HYDROCODONE-ACETAMINOPHEN 5-325 MG PO TABS
ORAL_TABLET | ORAL | Status: AC
  Filled 2014-08-03: qty 2

## 2014-08-03 MED ORDER — TECHNETIUM TC 99M SULFUR COLLOID
1.0450 | Freq: Once | INTRAVENOUS | Status: AC | PRN
  Administered 2014-08-03: 1.045 via INTRAVENOUS

## 2014-08-03 MED ORDER — HYDROCODONE-ACETAMINOPHEN 5-325 MG PO TABS: 1.0000 | ORAL_TABLET | ORAL | Status: DC | PRN

## 2014-08-03 MED ORDER — LACTATED RINGERS IV SOLN
INTRAVENOUS | Status: DC
  Administered 2014-08-03 (×3): via INTRAVENOUS

## 2014-08-03 SURGICAL SUPPLY — 55 items
APPLIER CLIP 11 MED OPEN (CLIP)
BANDAGE ELASTIC 6 CLIP NS LF (GAUZE/BANDAGES/DRESSINGS) IMPLANT
BLADE SURG 15 STRL SS SAFETY (BLADE) ×2 IMPLANT
BNDG GAUZE 4.5X4.1 6PLY STRL (MISCELLANEOUS) IMPLANT
BULB RESERV EVAC DRAIN JP 100C (MISCELLANEOUS) IMPLANT
CANISTER SUCT 1200ML W/VALVE (MISCELLANEOUS) ×2 IMPLANT
CHLORAPREP W/TINT 26ML (MISCELLANEOUS) ×2 IMPLANT
CLIP APPLIE 11 MED OPEN (CLIP) IMPLANT
CNTNR SPEC 2.5X3XGRAD LEK (MISCELLANEOUS) ×2
CONT SPEC 4OZ STER OR WHT (MISCELLANEOUS) ×2
CONTAINER SPEC 2.5X3XGRAD LEK (MISCELLANEOUS) ×2 IMPLANT
COVER PROBE FLX POLY STRL (MISCELLANEOUS) ×2 IMPLANT
DEVICE DUBIN SPECIMEN MAMMOGRA (MISCELLANEOUS) ×2 IMPLANT
DRAIN CHANNEL JP 15F RND 16 (MISCELLANEOUS) IMPLANT
DRAPE LAPAROTOMY TRNSV 106X77 (MISCELLANEOUS) ×2 IMPLANT
DRESSING TELFA 4X3 1S ST N-ADH (GAUZE/BANDAGES/DRESSINGS) ×2 IMPLANT
DRSG TEGADERM 4X4.75 (GAUZE/BANDAGES/DRESSINGS) ×2 IMPLANT
DRSG TELFA 3X8 NADH (GAUZE/BANDAGES/DRESSINGS) ×2 IMPLANT
ELECT CAUTERY BLADE TIP 2.5 (TIP) ×2
ELECTRODE CAUTERY BLDE TIP 2.5 (TIP) ×1 IMPLANT
GAUZE FLUFF 18X24 1PLY STRL (GAUZE/BANDAGES/DRESSINGS) ×2 IMPLANT
GLOVE BIO SURGEON STRL SZ7.5 (GLOVE) ×2 IMPLANT
GLOVE INDICATOR 8.0 STRL GRN (GLOVE) ×2 IMPLANT
GOWN STRL REUS W/ TWL LRG LVL3 (GOWN DISPOSABLE) ×2 IMPLANT
GOWN STRL REUS W/TWL LRG LVL3 (GOWN DISPOSABLE) ×2
HARMONIC SCALPEL FOCUS (MISCELLANEOUS) IMPLANT
KIT RM TURNOVER STRD PROC AR (KITS) ×2 IMPLANT
LABEL OR SOLS (LABEL) ×2 IMPLANT
MARGIN MAP 10MM (MISCELLANEOUS) ×2 IMPLANT
NDL SAFETY 18GX1.5 (NEEDLE) ×2 IMPLANT
NDL SAFETY 22GX1.5 (NEEDLE) ×2 IMPLANT
NDL SAFETY 25GX1.5 (NEEDLE) ×2 IMPLANT
NS IRRIG 500ML POUR BTL (IV SOLUTION) ×2 IMPLANT
PACK BASIN MINOR ARMC (MISCELLANEOUS) ×2 IMPLANT
PAD GROUND ADULT SPLIT (MISCELLANEOUS) ×2 IMPLANT
PIN SAFETY STRL (MISCELLANEOUS) IMPLANT
SHEARS FOC LG CVD HARMONIC 17C (MISCELLANEOUS) IMPLANT
SLEVE PROBE SENORX GAMMA FIND (MISCELLANEOUS) ×2 IMPLANT
STRIP CLOSURE SKIN 1/2X4 (GAUZE/BANDAGES/DRESSINGS) ×2 IMPLANT
SUT SILK 2 0 (SUTURE) ×1
SUT SILK 2-0 18XBRD TIE 12 (SUTURE) ×1 IMPLANT
SUT SILK 3 0 (SUTURE) ×1
SUT SILK 3-0 18XBRD TIE 12 (SUTURE) ×1 IMPLANT
SUT VIC AB 2-0 CT1 (SUTURE) ×4 IMPLANT
SUT VIC AB 2-0 CT1 27 (SUTURE)
SUT VIC AB 2-0 CT1 TAPERPNT 27 (SUTURE) IMPLANT
SUT VIC AB 3-0 54X BRD REEL (SUTURE) ×1 IMPLANT
SUT VIC AB 3-0 BRD 54 (SUTURE) ×1
SUT VIC AB 4-0 FS2 27 (SUTURE) IMPLANT
SUT VIC AB 4-0 PS2 18 (SUTURE) ×2 IMPLANT
SWABSTK COMLB BENZOIN TINCTURE (MISCELLANEOUS) ×2 IMPLANT
SYR CONTROL 10ML (SYRINGE) ×2 IMPLANT
SYRINGE 10CC LL (SYRINGE) ×2 IMPLANT
TAPE TRANSPORE STRL 2 31045 (GAUZE/BANDAGES/DRESSINGS) ×2 IMPLANT
WATER STERILE IRR 1000ML POUR (IV SOLUTION) ×2 IMPLANT

## 2014-08-03 NOTE — H&P (Signed)
No change in cardiopulmonary status. Plans for wide excision, SLN biopsy reviewed. Patient marked as per Dr. Edwin Dada request to maintain incision below 23 cm mark (from the sternal notch).

## 2014-08-03 NOTE — Progress Notes (Signed)
I was called to pr-op (Same Day Surgery) to discuss Advance Directive with patient. Patient was unavailable when I arrived. Son stated that he along with uncle go over material with mother prior to surgery for completion of AD. Upon returning to complete AD son stated that there was no time in the Dr's schedule to complete AD and it will have to be done at a later date. Patient remained quiet during introduction and vebal discourse. Percival Spanish, Chaplain [phone]

## 2014-08-03 NOTE — Anesthesia Preprocedure Evaluation (Addendum)
Anesthesia Evaluation  Patient identified by MRN, date of birth, ID band Patient awake    Reviewed: Allergy & Precautions, NPO status , Patient's Chart, lab work & pertinent test results  History of Anesthesia Complications (+) history of anesthetic complications  Airway Mallampati: III  TM Distance: >3 FB     Dental  (+) Chipped   Pulmonary former smoker,          Cardiovascular hypertension,     Neuro/Psych Depression    GI/Hepatic   Endo/Other    Renal/GU      Musculoskeletal   Abdominal   Peds  Hematology   Anesthesia Other Findings K is 3.6.obese.overbite.  Reproductive/Obstetrics                            Anesthesia Physical Anesthesia Plan  ASA: III  Anesthesia Plan: General   Post-op Pain Management:    Induction: Intravenous  Airway Management Planned: LMA  Additional Equipment:   Intra-op Plan:   Post-operative Plan:   Informed Consent: I have reviewed the patients History and Physical, chart, labs and discussed the procedure including the risks, benefits and alternatives for the proposed anesthesia with the patient or authorized representative who has indicated his/her understanding and acceptance.     Plan Discussed with: CRNA  Anesthesia Plan Comments:         Anesthesia Quick Evaluation

## 2014-08-03 NOTE — Discharge Instructions (Signed)
Wear bra day and night for the first three days. May remove at that time to shower.  Continue to wear bra day and night after bra removed on Sunday. OK to shower starting on Sunday. Tylenol/ Advil/ Aleve: If needed for soreness. Norco (hydrocodone): If needed for pain. This medication may constipate. Laxative of choice if needed.

## 2014-08-03 NOTE — Anesthesia Procedure Notes (Signed)
Procedure Name: LMA Insertion Performed by: Kaymon Denomme Pre-anesthesia Checklist: Patient identified, Emergency Drugs available, Suction available, Patient being monitored and Timeout performed Patient Re-evaluated:Patient Re-evaluated prior to inductionOxygen Delivery Method: Circle system utilized Preoxygenation: Pre-oxygenation with 100% oxygen Intubation Type: IV induction Ventilation: Mask ventilation without difficulty LMA: LMA inserted LMA Size: 4.0 Grade View: Grade II Number of attempts: 1 Placement Confirmation: ETT inserted through vocal cords under direct vision,  positive ETCO2 and breath sounds checked- equal and bilateral Tube secured with: Tape Dental Injury: Teeth and Oropharynx as per pre-operative assessment

## 2014-08-03 NOTE — Transfer of Care (Signed)
Immediate Anesthesia Transfer of Care Note  Patient: Madison Christensen  Procedure(s) Performed: Procedure(s): BREAST LUMPECTOMY WITH AXILLARY LYMPH NODE DISSECTION (Left) SENTINEL NODE BIOPSY (Left) AXILLARY LYMPH NODE BIOPSY (Left)  Patient Location: PACU  Anesthesia Type:General  Level of Consciousness: awake, alert  and confused  Airway & Oxygen Therapy: Patient Spontanous Breathing and Patient connected to face mask oxygen  Post-op Assessment: Report given to RN and Post -op Vital signs reviewed and stable  Post vital signs: Reviewed and stable  Last Vitals:  Filed Vitals:   08/03/14 1015  BP: 124/61  Pulse: 64  Temp: 36.6 C  Resp: 16    Complications: No apparent anesthesia complications

## 2014-08-03 NOTE — Anesthesia Postprocedure Evaluation (Signed)
  Anesthesia Post-op Note  Patient: Madison Christensen  Procedure(s) Performed: Procedure(s): BREAST LUMPECTOMY WITH AXILLARY LYMPH NODE DISSECTION (Left) SENTINEL NODE BIOPSY (Left) AXILLARY LYMPH NODE BIOPSY (Left)  Anesthesia type:General  Patient location: PACU  Post pain: Pain level controlled  Post assessment: Post-op Vital signs reviewed, Patient's Cardiovascular Status Stable, Respiratory Function Stable, Patent Airway and No signs of Nausea or vomiting  Post vital signs: Reviewed and stable  Last Vitals:  Filed Vitals:   08/03/14 1320  BP: 171/64  Pulse: 123  Temp: 37 C  Resp: 17    Level of consciousness: awake, alert  and patient cooperative  Complications: No apparent anesthesia complications

## 2014-08-03 NOTE — Op Note (Signed)
Preoperative diagnosis: Left breast cancer.  Postoperative diagnosis: Same.  Operative procedure: Wide excision with ultrasound guidance, sentinel node biopsy.  Operating surgeon: Hervey Ard, M.D.  Anesthesia: Gen. by LMA.  Estimated blood loss less than 30 mL.  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 She desired breast conservation. She has been seen preoperatively by the plastic surgery service. Plans are for mastopexy and breast reduction in 2 weeks after negative margins and negative nodal status is confirmed.  Operative note: The patient underwent general anesthesia without difficulty. The breast was prepped with chlor prep and draped. The patient had been injected with technetium sulfur colloid prior to the procedure and high counts were noted in the axilla. 4 mL of normal saline mixed 21 with methylene blue was injected in the subareolar plexus post induction of anesthesia to assist with sentinel node identification.  An axillary incision was made after instillation of 10 mL of 0.5% Marcaine with 1-200,000 epinephrine. The skin was incised sharply and the remaining dissection completed with electrocautery. 3 hot, non-blue nodes were identified and sent for frozen section analysis. These were reported as negative on touch prep by Delorse Lek, M.D. The axillary wound was subsequently closed with layered 20 Vicryls figure-of-eight sutures and a running 40 Vicryls suture for the skin.  Ultrasound was used to identify the previous biopsy site in the 11:00 position approximately 9 cm from the nipple. Prior to coming to the operating room the planned location of the nipple after mastopexy and reduction had been outlined and a curvilinear incision parallel to the areola 6 cm in length was made in the 11:00 position of the breast. The skin was incised sharply  and the remaining dissection completed with electrocautery. A thin mastectomy flap was raised superiorly to encompass the previously marked tumor site. This was approximately 5 mm in thickness. The dissection continued approximately 7 cm to the incision to provide a wide margin. A block of tissue 6 x 6 x 4 cm in diameter was orientated and specimen radiograph showed the previously placed biopsy clip within the center of the specimen. Margin analysis by Dr. Luana Shu showed the superficial margin closest at 5 mm. Good hemostasis was noted. Prior to incision Marcaine had been infiltrated for postoperative analgesia and hemostasis.  The deep adipose layer was approximated with interrupted 2-0 Vicryls figure-of-eight sutures. The skin was closed with a running 40 Vicryls septic suture. Benzoin Steri-Strips were applied to both wounds. Tegaderm dressing applied to the axillary wound. Telfa pad, fluff gauze applied to the wide excision site. The patient's bra was placed on her to provide compression. She was taken recovery room in stable condition.

## 2014-08-04 ENCOUNTER — Encounter: Payer: Self-pay | Admitting: General Surgery

## 2014-08-04 LAB — SURGICAL PATHOLOGY

## 2014-08-04 NOTE — Progress Notes (Signed)
Oncology Nurse Navigator Documentation  Oncology Nurse Navigator Flowsheets 08/04/2014  Navigator Encounter Type Telephone  Patient Visit Type Surgery  Treatment Phase Other  Barriers/Navigation Needs No barriers at this time  Time Spent with Patient 30   Patient reports she is doing well post-op.  Controlling discomfort with Norco, and short sessions of walking inside.  Good family/friend  Support. States she has follow-up with Dr. Bary Castilla Tuesday 08/08/14. Mentions her return to work may not be as soon as she thought, but will discuss with Dr. Bary Castilla on Tuesday.

## 2014-08-08 ENCOUNTER — Encounter: Payer: Self-pay | Admitting: General Surgery

## 2014-08-08 ENCOUNTER — Ambulatory Visit (INDEPENDENT_AMBULATORY_CARE_PROVIDER_SITE_OTHER): Payer: BLUE CROSS/BLUE SHIELD | Admitting: General Surgery

## 2014-08-08 ENCOUNTER — Other Ambulatory Visit: Payer: BLUE CROSS/BLUE SHIELD

## 2014-08-08 VITALS — BP 178/80 | HR 76 | Resp 14 | Ht 65.0 in | Wt 220.0 lb

## 2014-08-08 DIAGNOSIS — C50912 Malignant neoplasm of unspecified site of left female breast: Secondary | ICD-10-CM

## 2014-08-08 NOTE — Progress Notes (Signed)
Patient ID: Madison Christensen, female   DOB: 27-Dec-1950, 64 y.o.   MRN: [id number]  Chief Complaint  Patient presents with  . Routine Post Op    left lumpectomy with SN biopsy    HPI Madison Christensen is a 64 y.o. female.  Here today for postoperative visit, left lumpectomy with SN biopsy on 08-03-14. Patient states she is doing well. She is anxious to return to work in the [employer] at [employer].   HPI  Past Medical History  Diagnosis Date  . Hypertension   . Hyperlipidemia   . GERD (gastroesophageal reflux disease)   . Depression 1995  . Complication of anesthesia 2004    oversensitive to noise, lights  . Cancer     skin   . Carcinoma of left breast 07/30/2014    1.4 cm, T1c, N0, ER negative, PR negative, HER-2 positive    Past Surgical History  Procedure Laterality Date  . Back surgery    . Tonsillectomy    . Skin cancer  removal  2015    nose   . Colonoscopy  2013    Dr. Vira Agar  . Breast lumpectomy with axillary lymph node dissection Left 08/03/2014    Procedure: BREAST LUMPECTOMY WITH AXILLARY LYMPH NODE DISSECTION;  Surgeon: Robert Bellow, MD;  Location: [facility] ORS;  Service: General;  Laterality: Left;  . Sentinel node biopsy Left 08/03/2014    Procedure: SENTINEL NODE BIOPSY;  Surgeon: Robert Bellow, MD;  Location: [facility];  Service: General;  Laterality: Left;  . Axillary lymph node biopsy Left 08/03/2014    Procedure: AXILLARY LYMPH NODE BIOPSY;  Surgeon: Robert Bellow, MD;  Location: [facility];  Service: General;  Laterality: Left;  . Breast surgery Left Aug 03, 2014    Wide excision, sentinel node biopsy.    Family History  Problem Relation Age of Onset  . Breast cancer Mother 27       . Breast cancer Cousin 59         Social History History  Substance Use Topics  . Smoking status: Former Smoker -- 1.00 packs/day for 2 years    Types: Cigarettes    Quit date: 07/30/1976  . Smokeless tobacco: Not on file  . Alcohol Use: 0.0 - 1.2  oz/week    0 Standard drinks or equivalent, 0-1 Glasses of wine, 0-1 Shots of liquor per week    Allergies  Allergen Reactions  . Sulfa Antibiotics Hives and Rash    Current Outpatient Prescriptions  Medication Sig Dispense Refill  . cetirizine (ZYRTEC) 10 MG tablet Take 10 mg by mouth every morning. As needed for seasonal allergies.  3  . HYDROcodone-acetaminophen (NORCO) 5-325 MG per tablet Take 1-2 tablets by mouth every 4 (four) hours as needed for moderate pain or severe pain. 30 tablet 0  . KLOR-CON M20 20 MEQ tablet Take 20 mEq by mouth every morning.   4  . omeprazole (PRILOSEC) 40 MG capsule Take 40 mg by mouth every morning.   4  . pravastatin (PRAVACHOL) 40 MG tablet Take 40 mg by mouth every morning.   3  . sertraline (ZOLOFT) 100 MG tablet Take 100 mg by mouth every morning.   4  . triamterene-hydrochlorothiazide (MAXZIDE) 75-50 MG per tablet Take 1 tablet by mouth every morning.   4   No current facility-administered medications for this visit.    Review of Systems Review of Systems  Constitutional: Negative.   Respiratory: Negative.   Cardiovascular:  Negative.     Blood pressure 178/80, pulse 76, resp. rate 14, height _0  (1.651 m), weight 220 lb (99.791 kg).  Physical Exam Physical Exam  Constitutional: She is oriented to person, place, and time. She appears well-developed and well-nourished.  Pulmonary/Chest:    Neurological: She is alert and oriented to person, place, and time.  Skin: Skin is warm and dry.    Data Reviewed Pathology was reviewed. T1c, N0 ER negative, PR negative HER-2 positive tumor.  Phone conversation with Madison Christensen, M.D. He is recommending chemotherapy prior to breast reconstruction.  Assessment    Doing well status post wide excision.    Plan    Dr. Metro Kung recommendations were forwarded to the patient. She is scheduled to meet with him on June 6 2 discussed this issue and person. And email has been sent to Madison Bloom,  MD and plastic surgery so he can determine if he will postpone reconstruction until after adjuvant chemotherapy is administered.  The patient will tentatively return to work on 08/15/2014.     PCP:  Madison Christensen 08/09/2014, 1:37 PM

## 2014-08-08 NOTE — Patient Instructions (Addendum)
The patient is aware to call back for any questions or concerns. Patient may return to work in three weeks.

## 2014-08-09 ENCOUNTER — Encounter: Payer: Self-pay | Admitting: General Surgery

## 2014-08-09 DIAGNOSIS — C50912 Malignant neoplasm of unspecified site of left female breast: Secondary | ICD-10-CM | POA: Insufficient documentation

## 2014-08-11 ENCOUNTER — Other Ambulatory Visit: Payer: Self-pay | Admitting: *Deleted

## 2014-08-11 DIAGNOSIS — C50912 Malignant neoplasm of unspecified site of left female breast: Secondary | ICD-10-CM

## 2014-08-14 ENCOUNTER — Inpatient Hospital Stay: Payer: BLUE CROSS/BLUE SHIELD

## 2014-08-14 ENCOUNTER — Inpatient Hospital Stay: Payer: BLUE CROSS/BLUE SHIELD | Attending: Oncology | Admitting: Oncology

## 2014-08-14 ENCOUNTER — Encounter: Payer: Self-pay | Admitting: Oncology

## 2014-08-14 VITALS — BP 141/77 | HR 66 | Temp 95.6°F | Wt 221.6 lb

## 2014-08-14 DIAGNOSIS — I10 Essential (primary) hypertension: Secondary | ICD-10-CM | POA: Diagnosis not present

## 2014-08-14 DIAGNOSIS — Z79899 Other long term (current) drug therapy: Secondary | ICD-10-CM | POA: Diagnosis not present

## 2014-08-14 DIAGNOSIS — F329 Major depressive disorder, single episode, unspecified: Secondary | ICD-10-CM | POA: Diagnosis not present

## 2014-08-14 DIAGNOSIS — Z171 Estrogen receptor negative status [ER-]: Secondary | ICD-10-CM | POA: Diagnosis not present

## 2014-08-14 DIAGNOSIS — C50912 Malignant neoplasm of unspecified site of left female breast: Secondary | ICD-10-CM

## 2014-08-14 DIAGNOSIS — Z87891 Personal history of nicotine dependence: Secondary | ICD-10-CM

## 2014-08-14 DIAGNOSIS — Z5111 Encounter for antineoplastic chemotherapy: Secondary | ICD-10-CM | POA: Diagnosis not present

## 2014-08-14 DIAGNOSIS — E785 Hyperlipidemia, unspecified: Secondary | ICD-10-CM | POA: Diagnosis not present

## 2014-08-14 DIAGNOSIS — K219 Gastro-esophageal reflux disease without esophagitis: Secondary | ICD-10-CM

## 2014-08-14 DIAGNOSIS — Z803 Family history of malignant neoplasm of breast: Secondary | ICD-10-CM | POA: Diagnosis not present

## 2014-08-14 DIAGNOSIS — Z85828 Personal history of other malignant neoplasm of skin: Secondary | ICD-10-CM | POA: Diagnosis not present

## 2014-08-14 LAB — CBC WITH DIFFERENTIAL/PLATELET
Basophils Absolute: 0.1 10*3/uL (ref 0–0.1)
Basophils Relative: 1 %
Eosinophils Absolute: 0.1 10*3/uL (ref 0–0.7)
Eosinophils Relative: 1 %
HCT: 44.1 % (ref 35.0–47.0)
Hemoglobin: 14.6 g/dL (ref 12.0–16.0)
Lymphocytes Relative: 34 %
Lymphs Abs: 2.9 10*3/uL (ref 1.0–3.6)
MCH: 28.1 pg (ref 26.0–34.0)
MCHC: 33.1 g/dL (ref 32.0–36.0)
MCV: 84.8 fL (ref 80.0–100.0)
Monocytes Absolute: 0.7 10*3/uL (ref 0.2–0.9)
Monocytes Relative: 9 %
Neutro Abs: 4.6 10*3/uL (ref 1.4–6.5)
Neutrophils Relative %: 55 %
PLATELETS: 205 10*3/uL (ref 150–440)
RBC: 5.2 MIL/uL (ref 3.80–5.20)
RDW: 13.7 % (ref 11.5–14.5)
WBC: 8.5 10*3/uL (ref 3.6–11.0)

## 2014-08-14 LAB — COMPREHENSIVE METABOLIC PANEL
ALT: 19 U/L (ref 14–54)
ANION GAP: 4 — AB (ref 5–15)
AST: 20 U/L (ref 15–41)
Albumin: 4.1 g/dL (ref 3.5–5.0)
Alkaline Phosphatase: 48 U/L (ref 38–126)
BILIRUBIN TOTAL: 1 mg/dL (ref 0.3–1.2)
BUN: 23 mg/dL — ABNORMAL HIGH (ref 6–20)
CHLORIDE: 100 mmol/L — AB (ref 101–111)
CO2: 29 mmol/L (ref 22–32)
Calcium: 9.1 mg/dL (ref 8.9–10.3)
Creatinine, Ser: 1.07 mg/dL — ABNORMAL HIGH (ref 0.44–1.00)
GFR calc Af Amer: >60 mL/min (ref >60)
GFR calc non Af Amer: 54 mL/min — ABNORMAL LOW (ref >60)
Glucose, Bld: 98 mg/dL (ref 65–99)
Potassium: 3.6 mmol/L (ref 3.5–5.1)
SODIUM: 133 mmol/L — AB (ref 135–145)
Total Protein: 7.6 g/dL (ref 6.5–8.1)

## 2014-08-14 NOTE — Progress Notes (Signed)
[place] @ [facility] [phone]  Fax:([phone]) [place] OB: January 12, 1951  MR#: [id number]  XIH#:[id number]  Patient Care Team: Jerrol Banana., MD as PCP - General (Family Medicine) Robert Bellow, MD as Consulting Physician (General Surgery) Nicholaus Bloom, MD as Consulting Physician (Plastic Surgery) Leonie Green, MD as Consulting Physician (Surgery) Tammy Rondell Reams, [place] (Family Medicine)  CHIEF COMPLAINT:  Chief Complaint  Patient presents with  . Follow-up    Oncology History   1.  Abnormal mammogram in April of 2016 followed by ultrasound and based on clinical examination 1 cm tumor.  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  (July 05, 2014) 2.  Status post lumpectomy and sentinel lymph node evaluation tumor size is 1.4 cm sentinel lymph nodes negative.  Stage IC T1 cN0 M0 tumor 3.  Patient is going for breast reduction surgery in June of 2016     Carcinoma of left breast   07/05/2014 Initial Diagnosis Carcinoma of left breast    No flowsheet data found.  INTERVAL HISTORY: 64 year old lady underwent lumpectomy and sentinel lymph node biopsy.  Patient has stage IC disease.  Patient has been on to have reduction breast reduction surgery tomorrow. Patient and family here to discuss the results and the further planning of treatment REVIEW OF SYSTEMS:   GENERAL:  Feels good.  Active.  No fevers, sweats or weight loss. PERFORMANCE STATUS (ECOG):  0 HEENT:  No visual changes, runny nose, sore throat, mouth sores or tenderness. Lungs: No shortness of breath or cough.  No hemoptysis. Cardiac:  No chest pain, palpitations, orthopnea, or PND. GI:  No nausea, vomiting, diarrhea, constipation, melena or hematochezia. GU:  No urgency, frequency, dysuria, or hematuria. Musculoskeletal:  No back pain.  No joint pain.  No muscle  tenderness. Extremities:  No pain or swelling. Skin:  No rashes or skin changes. Neuro:  No headache, numbness or weakness, balance or coordination issues. Endocrine:  No diabetes, thyroid issues, hot flashes or night sweats. Psych:  No mood changes, depression or anxiety. Pain:  No focal pain. Review of systems:  All other systems reviewed and found to be negative. As per HPI. Otherwise, a complete review of systems is negatve.  PAST MEDICAL HISTORY: Past Medical History  Diagnosis Date  . Hypertension   . Hyperlipidemia   . GERD (gastroesophageal reflux disease)   . Depression 1995  . Complication of anesthesia 2004    oversensitive to noise, lights  . Cancer     skin   . Carcinoma of left breast 07/30/2014    64 cm, T1c, N0, ER negative, PR negative, HER-2 positive    PAST SURGICAL HISTORY: Past Surgical History  Procedure Laterality Date  . Back surgery    . Tonsillectomy    . Skin cancer  removal  2015    nose   . Colonoscopy  2013    Dr. Vira Agar  . Breast lumpectomy with axillary lymph node dissection Left 08/03/2014    Procedure: BREAST LUMPECTOMY WITH AXILLARY LYMPH NODE DISSECTION;  Surgeon: Robert Bellow, MD;  Location: [facility] OR[facility];  Service: General;  Laterality: Left;  . Sentinel node biopsy Left 08/03/2014    Procedure: SENTINEL NODE BIOPSY;  Surgeon: Robert Bellow, MD;  Location: [facility];  Service: General;  Laterality: Left;  . Axillary lymph node biopsy Left 08/03/2014    Procedure: AXILLARY LYMPH NODE BIOPSY;  Surgeon: Robert Bellow, MD;  Location: [facility];  Service: General;  Laterality: Left;  . Breast surgery Left Aug 03, 2014    Wide excision, sentinel node biopsy.    FAMILY HISTORY Family History  Problem Relation Age of Onset  . Breast cancer Mother 30       . Breast cancer Cousin 62         ADVANCED DIRECTIVES:  Patient does have advanced health care directive  HEALTH MAINTENANCE: History  Substance Use Topics  . Smoking  status: Former Smoker -- 1.00 packs/day for 2 years    Types: Cigarettes    Quit date: 07/30/1976  . Smokeless tobacco: Not on file  . Alcohol Use: 0.0 - 1.2 oz/week    0 Standard drinks or equivalent, 0-1 Glasses of wine, 0-1 Shots of liquor per week      Allergies  Allergen Reactions  . Sulfa Antibiotics Hives and Rash    Current Outpatient Prescriptions  Medication Sig Dispense Refill  . HYDROcodone-acetaminophen (NORCO) 5-325 MG per tablet Take 1-2 tablets by mouth every 4 (four) hours as needed for moderate pain or severe pain. 30 tablet 0  . KLOR-CON M20 20 MEQ tablet Take 20 mEq by mouth every morning.   4  . omeprazole (PRILOSEC) 40 MG capsule Take 40 mg by mouth every morning.   4  . pravastatin (PRAVACHOL) 40 MG tablet Take 40 mg by mouth every morning.   3  . sertraline (ZOLOFT) 100 MG tablet Take 100 mg by mouth every morning.   4  . triamterene-hydrochlorothiazide (MAXZIDE) 75-50 MG per tablet Take 1 tablet by mouth every morning.   4  . cetirizine (ZYRTEC) 10 MG tablet Take 10 mg by mouth every morning. As needed for seasonal allergies.  3   No current facility-administered medications for this visit.    OBJECTIVE:  Filed Vitals:   08/14/14 1035  BP: 141/77  Pulse: 66  Temp: 95.6 F (35.3 C)     Body mass index is 36.87 kg/(m^2).    ECOG FS:0 - Asymptomatic  PHYSICAL EXAM: General  status: Performance status is good.  Patient has not lost significant weight HEENT: No evidence of stomatitis. Sclera and conjunctivae :: No jaundice.   pale looking. Lungs: Air  entry equal on both sides.  No rhonchi.  No rales.  Cardiac: Heart sounds are normal.  No pericardial rub.  No murmur. Lymphatic system: Cervical, axillary, inguinal, lymph nodes not palpable GI: Abdomen is soft.  No ascites.  Liver spleen not palpable.  No tenderness.  Bowel sounds are within normal limit Lower extremity: No edema Neurological system: Higher functions, cranial nerves intact no evidence of  peripheral neuropathy. Skin: No rash.  No ecchymosis.. Breast was not examined    LAB RESULTS:  Appointment on 08/14/2014  Component Date Value Ref Range Status  . WBC 08/14/2014 8.5  3.6 - 11.0 K/uL Final  . RBC 08/14/2014 5.20  3.80 - 5.20 MIL/uL Final  . Hemoglobin 08/14/2014 14.6  12.0 - 16.0 g/dL Final  . HCT 08/14/2014 44.1  35.0 - 47.0 % Final  . MCV 08/14/2014 84.8  80.0 - 100.0 fL Final  . MCH 08/14/2014 28.1  26.0 - 34.0 pg Final  . MCHC 08/14/2014 33.1  32.0 - 36.0 g/dL Final  . RDW 08/14/2014 13.7  11.5 - 14.5 % Final  . Platelets 08/14/2014 205  150 - 440 K/uL Final  . Neutrophils Relative % 08/14/2014 55   Final  . Neutro  Abs 08/14/2014 4.6  1.4 - 6.5 K/uL Final  . Lymphocytes Relative 08/14/2014 34   Final  . Lymphs Abs 08/14/2014 2.9  1.0 - 3.6 K/uL Final  . Monocytes Relative 08/14/2014 9   Final  . Monocytes Absolute 08/14/2014 0.7  0.2 - 0.9 K/uL Final  . Eosinophils Relative 08/14/2014 1   Final  . Eosinophils Absolute 08/14/2014 0.1  0 - 0.7 K/uL Final  . Basophils Relative 08/14/2014 1   Final  . Basophils Absolute 08/14/2014 0.1  0 - 0.1 K/uL Final  . Sodium 08/14/2014 133* 135 - 145 mmol/L Final  . Potassium 08/14/2014 3.6  3.5 - 5.1 mmol/L Final  . Chloride 08/14/2014 100* 101 - 111 mmol/L Final  . CO2 08/14/2014 29  22 - 32 mmol/L Final  . Glucose, Bld 08/14/2014 98  65 - 99 mg/dL Final  . BUN 08/14/2014 23* 6 - 20 mg/dL Final  . Creatinine, Ser 08/14/2014 1.07* 0.44 - 1.00 mg/dL Final  . Calcium 08/14/2014 9.1  8.9 - 10.3 mg/dL Final  . Total Protein 08/14/2014 7.6  6.5 - 8.1 g/dL Final  . Albumin 08/14/2014 4.1  3.5 - 5.0 g/dL Final  . AST 08/14/2014 20  15 - 41 U/L Final  . ALT 08/14/2014 19  14 - 54 U/L Final  . Alkaline Phosphatase 08/14/2014 48  38 - 126 U/L Final  . Total Bilirubin 08/14/2014 1.0  0.3 - 1.2 mg/dL Final  . GFR calc non Af Amer 08/14/2014 54* >60 mL/min Final  . GFR calc Af Amer 08/14/2014 >60  >60 mL/min Final   Comment:  (NOTE) The eGFR has been calculated using the CKD EPI equation. This calculation has not been validated in all clinical situations. eGFR's persistently <60 mL/min signify possible Chronic Kidney Disease.   . Anion gap 08/14/2014 4* 5 - 15 Final      STUDIES: Nm Sentinel Node Inj-no Rpt (breast)  08/03/2014   CLINICAL DATA: left breast cancer   Sulfur colloid was injected intradermally by the nuclear medicine  technologist for breast cancer sentinel node localization.     ASSESSMENT: Carcinoma of breast status post lumpectomy and sentinel lymph node (left breast) stage IC  MEDICAL DECISION MAKING:  I detailed discussed with patient and family.  Patient is agreeable to initiate adjuvant chemotherapy however were she is also interested immediately in breast reduction surgery.. I had detailed discussion with the breast surgeon and plan is to proceed with breast reduction surgery tomorrow. According to Dr. Tula Nakayama patient will be ready for chemotherapy in coming to one half weeks We will proceed with port placement with Dr. Bary Castilla in 2 weeks And Herceptin every 3 weeks for 1 year and Taxol weekly for 12 times as been planned   Patient expressed understanding and was in agreement with this plan. She also understands that She can call clinic at any time with any questions, concerns, or complaints.    Carcinoma of left breast   Staging form: Breast, AJCC 7th Edition     Clinical: Stage IA (T1c, N0, M0) - Marni Griffon, MD   08/14/2014 9:33 PM

## 2014-08-14 NOTE — Progress Notes (Signed)
Pt former smoker.  Does not have living will. 

## 2014-08-28 ENCOUNTER — Ambulatory Visit (INDEPENDENT_AMBULATORY_CARE_PROVIDER_SITE_OTHER): Payer: BLUE CROSS/BLUE SHIELD | Admitting: General Surgery

## 2014-08-28 ENCOUNTER — Encounter: Payer: Self-pay | Admitting: General Surgery

## 2014-08-28 ENCOUNTER — Other Ambulatory Visit: Payer: BLUE CROSS/BLUE SHIELD

## 2014-08-28 VITALS — BP 120/70 | HR 74 | Resp 12 | Ht 65.0 in | Wt 220.0 lb

## 2014-08-28 DIAGNOSIS — C50912 Malignant neoplasm of unspecified site of left female breast: Secondary | ICD-10-CM

## 2014-08-28 NOTE — Progress Notes (Signed)
Patient ID: Madison Christensen, female   DOB: August 21, 1950, 64 y.o.   MRN: [id number]  Chief Complaint  Patient presents with  . Routine Post Op    Left mastectomy    HPI MARDELLA Christensen is a 64 y.o. female Here today for postoperative visit, left lumpectomy with SN biopsy on 08-03-14. Patient states she is doing well. She underwent reduction mammoplasty and mastopexy on both breasts on June 7 and has done well.  Plans are to initiate chemotherapy the week of June 27. Central venous access has been requested by her medical oncologist. HPI  Past Medical History  Diagnosis Date  . Hypertension   . Hyperlipidemia   . GERD (gastroesophageal reflux disease)   . Depression 1995  . Complication of anesthesia 2004    oversensitive to noise, lights  . Cancer     skin   . Carcinoma of left breast 07/30/2014    1.4 cm, T1c, N0, ER negative, PR negative, HER-2 positive    Past Surgical History  Procedure Laterality Date  . Back surgery    . Tonsillectomy    . Skin cancer  removal  2015    nose   . Colonoscopy  2013    Dr. Vira Agar  . Breast lumpectomy with axillary lymph node dissection Left 08/03/2014    Procedure: BREAST LUMPECTOMY WITH AXILLARY LYMPH NODE DISSECTION;  Surgeon: Robert Bellow, MD;  Location: [facility] ORS;  Service: General;  Laterality: Left;  . Sentinel node biopsy Left 08/03/2014    Procedure: SENTINEL NODE BIOPSY;  Surgeon: Robert Bellow, MD;  Location: [facility] OR[facility];  Service: General;  Laterality: Left;  . Axillary lymph node biopsy Left 08/03/2014    Procedure: AXILLARY LYMPH NODE BIOPSY;  Surgeon: Robert Bellow, MD;  Location: [facility] OR[facility];  Service: General;  Laterality: Left;  . Breast surgery Left Aug 03, 2014    Wide excision, sentinel node biopsy.    Family History  Problem Relation Age of Onset  . Breast cancer Mother 64       . Breast cancer Cousin 27         Social History History  Substance Use Topics  . Smoking status: Former Smoker -- 1.00  packs/day for 2 years    Types: Cigarettes    Quit date: 07/30/1976  . Smokeless tobacco: Not on file  . Alcohol Use: 0.0 - 1.2 oz/week    0 Standard drinks or equivalent, 0-1 Glasses of wine, 0-1 Shots of liquor per week    Allergies  Allergen Reactions  . Sulfa Antibiotics Hives and Rash    Current Outpatient Prescriptions  Medication Sig Dispense Refill  . cetirizine (ZYRTEC) 10 MG tablet Take 10 mg by mouth every morning. As needed for seasonal allergies.  3  . HYDROcodone-acetaminophen (NORCO) 5-325 MG per tablet Take 1-2 tablets by mouth every 4 (four) hours as needed for moderate pain or severe pain. 30 tablet 0  . KLOR-CON M20 20 MEQ tablet Take 20 mEq by mouth every morning.   4  . omeprazole (PRILOSEC) 40 MG capsule Take 40 mg by mouth every morning.   4  . pravastatin (PRAVACHOL) 40 MG tablet Take 40 mg by mouth every morning.   3  . sertraline (ZOLOFT) 100 MG tablet Take 100 mg by mouth every morning.   4  . triamterene-hydrochlorothiazide (MAXZIDE) 75-50 MG per tablet Take 1 tablet by mouth every morning.   4   No current facility-administered medications for this visit.  Review of Systems Review of Systems  Constitutional: Negative.   Respiratory: Negative.   Cardiovascular: Negative.     Blood pressure 120/70, pulse 74, resp. rate 12, height 5' 5"  (1.651 m), weight 220 lb (99.791 kg).  Physical Exam Physical Exam  Constitutional: She is oriented to person, place, and time. She appears well-developed and well-nourished.  Eyes: Conjunctivae are normal. No scleral icterus.  Neck: Neck supple.  Cardiovascular: Normal rate, regular rhythm and normal heart sounds.   Pulmonary/Chest: Effort normal and breath sounds normal.  Left breast wide excision site and subsequent mastopexy wounds healing well. Right breast incisions healing well.  Lymphadenopathy:    She has no cervical adenopathy.  Neurological: She is alert and oriented to person, place, and time.   Skin: Skin is warm and dry.      Assessment    HER-2/neu negative breast cancer, for adjuvant chemotherapy.    Plan    Power port placement is scheduled for 08/31/2014.   The risks associated with central venous access including arterial, pulmonary and venous injury were reviewed. The possible need for additional treatment if pulmonary injury occurs (chest tube placement) was discussed.  [facility]   PCP:  Philemon Kingdom 08/29/2014, 8:57 AM

## 2014-08-28 NOTE — Patient Instructions (Addendum)
Implanted Port Insertion An implanted port is a central line that has a round shape and is placed under the skin. It is used as a long-term IV access for:   Medicines, such as chemotherapy.   Fluids.   Liquid nutrition, such as total parenteral nutrition (TPN).   Blood samples.  LET YOUR HEALTH CARE PROVIDER KNOW ABOUT:  Allergies to food or medicine.   Medicines taken, including vitamins, herbs, eye drops, creams, and over-the-counter medicines.   Any allergies to heparin.  Use of steroids (by mouth or creams).   Previous problems with anesthetics or numbing medicines.   History of bleeding problems or blood clots.   Previous surgery.   Other health problems, including diabetes and kidney problems.   Possibility of pregnancy, if this applies. RISKS AND COMPLICATIONS Generally, this is a safe procedure. However, as with any procedure, problems can occur. Possible problems include:  Damage to the blood vessel, bruising, or bleeding at the puncture site.   Infection.  Blood clot in the vessel that the port is in.  Breakdown of the skin over your port.  Very rarely a person may develop a condition called a pneumothorax, a collection of air in the chest that may cause one of the lungs to collapse. The placement of these catheters with the appropriate imaging guidance significantly decreases the risk of a pneumothorax.  BEFORE THE PROCEDURE   Your health care provider may want you to have blood tests. These tests can help tell how well your kidneys and liver are working. They can also show how well your blood clots.   If you take blood thinners (anticoagulant medicines), ask your health care provider when you should stop taking them.   Make arrangements for someone to drive you home. This is necessary if you have been sedated for your procedure.  PROCEDURE  Port insertion usually takes about 30-45 minutes.   An IV needle will be inserted in your arm.  Medicine for pain and medicine to help relax you (sedative) will flow directly into your body through this needle.   You will lie on an exam table, and you will be connected to monitors to keep track of your heart rate, blood pressure, and breathing throughout the procedure.  An oxygen monitoring device may be attached to your finger. Oxygen will be given.   Everything will be kept as germ free (sterile) as possible during the procedure. The skin near the point of the incision will be cleansed with antiseptic, and the area will be draped with sterile towels. The skin and deeper tissues over the port area will be made numb with a local anesthetic.  Two small cuts (incisions) will be made in the skin to insert the port. One will be made in the neck to obtain access to the vein where the catheter will lie.   Because the port reservoir will be placed under the skin, a small skin incision will be made in the upper chest, and a small pocket for the port will be made under the skin. The catheter that will be connected to the port tunnels to a large central vein in the chest. A small, raised area will remain on your body at the site of the reservoir when the procedure is complete.  The port placement will be done under imaging guidance to ensure the proper placement.  The reservoir has a silicone covering that can be punctured with a special needle.   The port will be flushed with normal   saline, and blood will be drawn to make sure it is working properly.  There will be nothing remaining outside the skin when the procedure is finished.   Incisions will be held together by stitches, surgical glue, or a special tape. AFTER THE PROCEDURE  You will stay in a recovery area until the anesthesia has worn off. Your blood pressure and pulse will be checked.  A final chest X-ray will be taken to check the placement of the port and to ensure that there is no injury to your lung. Document Released:  12/15/2012 Document Revised: 07/11/2013 Document Reviewed: 12/15/2012 [employer] Patient Information 2015 [place], [place]. This information is not intended to replace advice given to you by your health care provider. Make sure you discuss any questions you have with your health care provider.  Patient's surgery has been scheduled for 08-31-14 at [facility].

## 2014-08-29 ENCOUNTER — Inpatient Hospital Stay
Admission: RE | Admit: 2014-08-29 | Discharge: 2014-08-29 | Disposition: A | Discharge status: Home / Self Care | Payer: BLUE CROSS/BLUE SHIELD | Source: Ambulatory Visit

## 2014-08-29 NOTE — Patient Instructions (Signed)
  Your procedure is scheduled on: August 31, 2014. Report to Same Day Surgery. To find out your arrival time please call [phone] between 1PM - 3PM on Wednesday August 30, 2014 .  Remember: Instructions that are not followed completely may result in serious medical risk, up to and including death, or upon the discretion of your surgeon and anesthesiologist your surgery may need to be rescheduled.    __x__ 1. Do not eat food or drink liquids after midnight. No gum chewing or hard candies.     __x__ 2. No Alcohol for 24 hours before or after surgery.   ____ 3. Bring all medications with you on the day of surgery if instructed.    __x__ 4. Notify your doctor if there is any change in your medical condition     (cold, fever, infections).     Do not wear jewelry, make-up, hairpins, clips or nail polish.  Do not wear lotions, powders, or perfumes. You may wear deodorant.  Do not shave 48 hours prior to surgery. Men may shave face and neck.  Do not bring valuables to the hospital.    [facility] is not responsible for any belongings or valuables.               Contacts, dentures or bridgework may not be worn into surgery.  Leave your suitcase in the car. After surgery it may be brought to your room.  For patients admitted to the hospital, discharge time is determined by your treatment team.   Patients discharged the day of surgery will not be allowed to drive home.    Please read over the following fact sheets that you were given:     __x__ Take these medicines the morning of surgery with A SIP OF WATER:    1. omeprazole (PRILOSEC)  2. pravastatin (PRAVACHOL)  3. sertraline (ZOLOFT)    ____ Fleet Enema (as directed)   _x___ Use CHG Soap as directed.  Pt has soap from prior surgery.  ____ Use inhalers on the day of surgery  ____ Stop metformin 2 days prior to surgery    ____ Take 1/2 of usual insulin dose the night before surgery and none on the morning of surgery.   ____  Stop Coumadin/Plavix/aspirin on does not apply.  ____ Stop Anti-inflammatories on Aleve today.   ____ Stop supplements until after surgery.    ____ Bring C-Pap to the hospital.

## 2014-08-29 NOTE — H&P (Addendum)
Patient ID: SANYIA DINI, female DOB: 03/26/1950, 64 y.o. MRN: [id number]  Chief Complaint   Patient presents with   .  Routine Post Op     Left mastectomy    HPI  ADYN HOES is a 64 y.o. female Here today for postoperative visit, left lumpectomy with SN biopsy on 08-03-14. Patient states she is doing well. She underwent reduction mammoplasty and mastopexy on both breasts on June 7 and has done well.  Plans are to initiate chemotherapy the week of June 27. Central venous access has been requested by her medical oncologist.  HPI  Past Medical History   Diagnosis  Date   .  Hypertension    .  Hyperlipidemia    .  GERD (gastroesophageal reflux disease)    .  Depression  1995   .  Complication of anesthesia  2004     oversensitive to noise, lights   .  Cancer      skin   .  Carcinoma of left breast  07/30/2014     1.4 cm, T1c, N0, ER negative, PR negative, HER-2 positive    Past Surgical History   Procedure  Laterality  Date   .  Back surgery     .  Tonsillectomy     .  Skin cancer removal   2015     nose   .  Colonoscopy   2013     Dr. Vira Agar   .  Breast lumpectomy with axillary lymph node dissection  Left  08/03/2014     Procedure: BREAST LUMPECTOMY WITH AXILLARY LYMPH NODE DISSECTION; Surgeon: Robert Bellow, MD; Location: [facility] ORS; Service: General; Laterality: Left;   .  Sentinel node biopsy  Left  08/03/2014     Procedure: SENTINEL NODE BIOPSY; Surgeon: Robert Bellow, MD; Location: [facility] ORS; Service: General; Laterality: Left;   .  Axillary lymph node biopsy  Left  08/03/2014     Procedure: AXILLARY LYMPH NODE BIOPSY; Surgeon: Robert Bellow, MD; Location: [facility] ORS; Service: General; Laterality: Left;   .  Breast surgery  Left  Aug 03, 2014     Wide excision, sentinel node biopsy.    Family History   Problem  Relation  Age of Onset   .  Breast cancer  Mother  49       .  Breast cancer  Cousin  76        Social History  History   Substance Use  Topics   .  Smoking status:  Former Smoker -- 1.00 packs/day for 2 years     Types:  Cigarettes     Quit date:  07/30/1976   .  Smokeless tobacco:  Not on file   .  Alcohol Use:  0.0 - 1.2 oz/week     0 Standard drinks or equivalent, 0-1 Glasses of wine, 0-1 Shots of liquor per week    Allergies   Allergen  Reactions   .  Sulfa Antibiotics  Hives and Rash    Current Outpatient Prescriptions   Medication  Sig  Dispense  Refill   .  cetirizine (ZYRTEC) 10 MG tablet  Take 10 mg by mouth every morning. As needed for seasonal allergies.   3   .  HYDROcodone-acetaminophen (NORCO) 5-325 MG per tablet  Take 1-2 tablets by mouth every 4 (four) hours as needed for moderate pain or severe pain.  30 tablet  0   .  KLOR-CON M20 20 MEQ tablet  Take 20 mEq by mouth every morning.   4   .  omeprazole (PRILOSEC) 40 MG capsule  Take 40 mg by mouth every morning.   4   .  pravastatin (PRAVACHOL) 40 MG tablet  Take 40 mg by mouth every morning.   3   .  sertraline (ZOLOFT) 100 MG tablet  Take 100 mg by mouth every morning.   4   .  triamterene-hydrochlorothiazide (MAXZIDE) 75-50 MG per tablet  Take 1 tablet by mouth every morning.   4    No current facility-administered medications for this visit.    Review of Systems  Review of Systems  Constitutional: Negative.  Respiratory: Negative.  Cardiovascular: Negative.   Blood pressure 120/70, pulse 74, resp. rate 12, height _0  (1.651 m), weight 220 lb (99.791 kg).  Physical Exam  Physical Exam  Constitutional: She is oriented to person, place, and time. She appears well-developed and well-nourished.  Eyes: Conjunctivae are normal. No scleral icterus.  Neck: Neck supple.  Cardiovascular: Normal rate, regular rhythm and normal heart sounds.  Pulmonary/Chest: Effort normal and breath sounds normal.   left breast wide excision and mastopexy site healing well. Right breast incisions healing well.  Lymphadenopathy:  She has no cervical adenopathy.   Neurological: She is alert and oriented to person, place, and time.  Skin: Skin is warm and dry.   Assessment   HER-2/neu negative breast cancer, for adjuvant chemotherapy.   Plan   Power port placement is scheduled for 08/31/2014.  The risks associated with central venous access including arterial, pulmonary and venous injury were reviewed. The possible need for additional treatment if pulmonary injury occurs (chest tube placement) was discussed.  Patient's surgery has been scheduled for 08-31-14 at [facility].  PCP: Philemon Kingdom

## 2014-08-29 NOTE — Addendum Note (Signed)
Addended by: Robert Bellow on: 08/29/2014 09:01 AM   Modules accepted: Level of Service

## 2014-08-30 NOTE — Patient Instructions (Signed)
Trastuzumab injection for infusion What is this medicine? TRASTUZUMAB (tras TOO zoo mab) is a monoclonal antibody. It targets a protein called HER2. This protein is found in some stomach and breast cancers. This medicine can stop cancer cell growth. This medicine may be used with other cancer treatments. This medicine may be used for other purposes; ask your health care provider or pharmacist if you have questions. COMMON BRAND NAME(S): Herceptin What should I tell my health care provider before I take this medicine? They need to know if you have any of these conditions: -heart disease -heart failure -infection (especially a virus infection such as chickenpox, cold sores, or herpes) -lung or breathing disease, like asthma -recent or ongoing radiation therapy -an unusual or allergic reaction to trastuzumab, benzyl alcohol, or other medications, foods, dyes, or preservatives -pregnant or trying to get pregnant -breast-feeding How should I use this medicine? This drug is given as an infusion into a vein. It is administered in a hospital or clinic by a specially trained health care professional. Talk to your pediatrician regarding the use of this medicine in children. This medicine is not approved for use in children. Overdosage: If you think you have taken too much of this medicine contact a poison control center or emergency room at once. NOTE: This medicine is only for you. Do not share this medicine with others. What if I miss a dose? It is important not to miss a dose. Call your doctor or health care professional if you are unable to keep an appointment. What may interact with this medicine? -cyclophosphamide -doxorubicin -warfarin This list may not describe all possible interactions. Give your health care provider a list of all the medicines, herbs, non-prescription drugs, or dietary supplements you use. Also tell them if you smoke, drink alcohol, or use illegal drugs. Some items may  interact with your medicine. What should I watch for while using this medicine? Visit your doctor for checks on your progress. Report any side effects. Continue your course of treatment even though you feel ill unless your doctor tells you to stop. Call your doctor or health care professional for advice if you get a fever, chills or sore throat, or other symptoms of a cold or flu. Do not treat yourself. Try to avoid being around people who are sick. You may experience fever, chills and shaking during your first infusion. These effects are usually mild and can be treated with other medicines. Report any side effects during the infusion to your health care professional. Fever and chills usually do not happen with later infusions. What side effects may I notice from receiving this medicine? Side effects that you should report to your doctor or other health care professional as soon as possible: -breathing difficulties -chest pain or palpitations -cough -dizziness or fainting -fever or chills, sore throat -skin rash, itching or hives -swelling of the legs or ankles -unusually weak or tired Side effects that usually do not require medical attention (report to your doctor or other health care professional if they continue or are bothersome): -loss of appetite -headache -muscle aches -nausea This list may not describe all possible side effects. Call your doctor for medical advice about side effects. You may report side effects to FDA at 1-800-FDA-1088. Where should I keep my medicine? This drug is given in a hospital or clinic and will not be stored at home. NOTE: This sheet is a summary. It may not cover all possible information. If you have questions about this medicine, talk   FDA at 1-800-FDA-1088.  Where should I keep my medicine?  This drug is given in a hospital or clinic and will not be stored at home.  NOTE: This sheet is a summary. It may not cover all possible information. If you have questions about this medicine, talk to your doctor, pharmacist, or health care provider.   2015, Elsevier/Gold Standard. (2008-12-29 13:43:15)  Paclitaxel injection  What is this medicine?  PACLITAXEL (PAK li TAX el) is a chemotherapy drug. It targets fast  dividing cells, like cancer cells, and causes these cells to die. This medicine is used to treat ovarian cancer, breast cancer, and other cancers.  This medicine may be used for other purposes; ask your health care provider or pharmacist if you have questions.  COMMON BRAND NAME(S): Onxol, Taxol  What should I tell my health care provider before I take this medicine?  They need to know if you have any of these conditions:  -blood disorders  -irregular heartbeat  -infection (especially a virus infection such as chickenpox, cold sores, or herpes)  -liver disease  -previous or ongoing radiation therapy  -an unusual or allergic reaction to paclitaxel, alcohol, polyoxyethylated castor oil, other chemotherapy agents, other medicines, foods, dyes, or preservatives  -pregnant or trying to get pregnant  -breast-feeding  How should I use this medicine?  This drug is given as an infusion into a vein. It is administered in a hospital or clinic by a specially trained health care professional.  Talk to your pediatrician regarding the use of this medicine in children. Special care may be needed.  Overdosage: If you think you have taken too much of this medicine contact a poison control center or emergency room at once.  NOTE: This medicine is only for you. Do not share this medicine with others.  What if I miss a dose?  It is important not to miss your dose. Call your doctor or health care professional if you are unable to keep an appointment.  What may interact with this medicine?  Do not take this medicine with any of the following medications:  -disulfiram  -metronidazole  This medicine may also interact with the following medications:  -cyclosporine  -diazepam  -ketoconazole  -medicines to increase blood counts like filgrastim, pegfilgrastim, sargramostim  -other chemotherapy drugs like cisplatin, doxorubicin, epirubicin, etoposide, teniposide, vincristine  -quinidine  -testosterone  -vaccines  -verapamil  Talk to your doctor  or health care professional before taking any of these medicines:  -acetaminophen  -aspirin  -ibuprofen  -ketoprofen  -naproxen  This list may not describe all possible interactions. Give your health care provider a list of all the medicines, herbs, non-prescription drugs, or dietary supplements you use. Also tell them if you smoke, drink alcohol, or use illegal drugs. Some items may interact with your medicine.  What should I watch for while using this medicine?  Your condition will be monitored carefully while you are receiving this medicine. You will need important blood work done while you are taking this medicine.  This drug may make you feel generally unwell. This is not uncommon, as chemotherapy can affect healthy cells as well as cancer cells. Report any side effects. Continue your course of treatment even though you feel ill unless your doctor tells you to stop.  In some cases, you may be given additional medicines to help with side effects. Follow all directions for their use.  Call your doctor or health care professional for advice if you get a fever, chills or   sore throat, or other symptoms of a cold or flu. Do not treat yourself. This drug decreases your body's ability to fight infections. Try to avoid being around people who are sick.  This medicine may increase your risk to bruise or bleed. Call your doctor or health care professional if you notice any unusual bleeding.  Be careful brushing and flossing your teeth or using a toothpick because you may get an infection or bleed more easily. If you have any dental work done, tell your dentist you are receiving this medicine.  Avoid taking products that contain aspirin, acetaminophen, ibuprofen, naproxen, or ketoprofen unless instructed by your doctor. These medicines may hide a fever.  Do not become pregnant while taking this medicine. Women should inform their doctor if they wish to become pregnant or think they might be pregnant. There is a potential  for serious side effects to an unborn child. Talk to your health care professional or pharmacist for more information. Do not breast-feed an infant while taking this medicine.  Men are advised not to father a child while receiving this medicine.  What side effects may I notice from receiving this medicine?  Side effects that you should report to your doctor or health care professional as soon as possible:  -allergic reactions like skin rash, itching or hives, swelling of the face, lips, or tongue  -low blood counts - This drug may decrease the number of white blood cells, red blood cells and platelets. You may be at increased risk for infections and bleeding.  -signs of infection - fever or chills, cough, sore throat, pain or difficulty passing urine  -signs of decreased platelets or bleeding - bruising, pinpoint red spots on the skin, black, tarry stools, nosebleeds  -signs of decreased red blood cells - unusually weak or tired, fainting spells, lightheadedness  -breathing problems  -chest pain  -high or low blood pressure  -mouth sores  -nausea and vomiting  -pain, swelling, redness or irritation at the injection site  -pain, tingling, numbness in the hands or feet  -slow or irregular heartbeat  -swelling of the ankle, feet, hands  Side effects that usually do not require medical attention (report to your doctor or health care professional if they continue or are bothersome):  -bone pain  -complete hair loss including hair on your head, underarms, pubic hair, eyebrows, and eyelashes  -changes in the color of fingernails  -diarrhea  -loosening of the fingernails  -loss of appetite  -muscle or joint pain  -red flush to skin  -sweating  This list may not describe all possible side effects. Call your doctor for medical advice about side effects. You may report side effects to FDA at 1-800-FDA-1088.  Where should I keep my medicine?  This drug is given in a hospital or clinic and will not be stored at home.  NOTE: This  sheet is a summary. It may not cover all possible information. If you have questions about this medicine, talk to your doctor, pharmacist, or health care provider.   2015, Elsevier/Gold Standard. (2012-04-19 16:41:21)

## 2014-08-31 ENCOUNTER — Ambulatory Visit: Payer: BLUE CROSS/BLUE SHIELD

## 2014-08-31 ENCOUNTER — Ambulatory Visit: Payer: BLUE CROSS/BLUE SHIELD | Admitting: Anesthesiology

## 2014-08-31 ENCOUNTER — Ambulatory Visit
Admission: RE | Admit: 2014-08-31 | Discharge: 2014-08-31 | Disposition: A | Discharge status: Home / Self Care | Payer: BLUE CROSS/BLUE SHIELD | Source: Ambulatory Visit | Attending: General Surgery | Admitting: General Surgery

## 2014-08-31 ENCOUNTER — Encounter
Admission: RE | Disposition: A | Discharge status: Home / Self Care | Payer: Self-pay | Source: Ambulatory Visit | Attending: General Surgery

## 2014-08-31 ENCOUNTER — Encounter: Payer: Self-pay | Admitting: *Deleted

## 2014-08-31 DIAGNOSIS — K219 Gastro-esophageal reflux disease without esophagitis: Secondary | ICD-10-CM | POA: Insufficient documentation

## 2014-08-31 DIAGNOSIS — I10 Essential (primary) hypertension: Secondary | ICD-10-CM | POA: Insufficient documentation

## 2014-08-31 DIAGNOSIS — Z452 Encounter for adjustment and management of vascular access device: Secondary | ICD-10-CM | POA: Diagnosis present

## 2014-08-31 DIAGNOSIS — Z79899 Other long term (current) drug therapy: Secondary | ICD-10-CM | POA: Diagnosis not present

## 2014-08-31 DIAGNOSIS — F329 Major depressive disorder, single episode, unspecified: Secondary | ICD-10-CM | POA: Insufficient documentation

## 2014-08-31 DIAGNOSIS — Z87891 Personal history of nicotine dependence: Secondary | ICD-10-CM | POA: Insufficient documentation

## 2014-08-31 DIAGNOSIS — C50912 Malignant neoplasm of unspecified site of left female breast: Secondary | ICD-10-CM | POA: Diagnosis not present

## 2014-08-31 DIAGNOSIS — Z95828 Presence of other vascular implants and grafts: Secondary | ICD-10-CM

## 2014-08-31 HISTORY — PX: PORTACATH PLACEMENT: SHX2246

## 2014-08-31 SURGERY — INSERTION, TUNNELED CENTRAL VENOUS DEVICE, WITH PORT
Anesthesia: Monitor Anesthesia Care | Laterality: Right | Wound class: Clean

## 2014-08-31 MED ORDER — PROPOFOL 10 MG/ML IV BOLUS
INTRAVENOUS | Status: DC | PRN
  Administered 2014-08-31: 10 mg via INTRAVENOUS
  Administered 2014-08-31: 50 mg via INTRAVENOUS

## 2014-08-31 MED ORDER — FENTANYL CITRATE (PF) 100 MCG/2ML IJ SOLN: 25.0000 ug | INTRAMUSCULAR | Status: DC | PRN

## 2014-08-31 MED ORDER — LIDOCAINE HCL (PF) 1 % IJ SOLN
INTRAMUSCULAR | Status: DC | PRN
  Administered 2014-08-31: 26 mL via INTRADERMAL

## 2014-08-31 MED ORDER — CEFAZOLIN SODIUM-DEXTROSE 2-3 GM-% IV SOLR
INTRAVENOUS | Status: AC
  Filled 2014-08-31: qty 50

## 2014-08-31 MED ORDER — SODIUM CHLORIDE 0.9 % IJ SOLN
INTRAMUSCULAR | Status: AC
  Filled 2014-08-31: qty 50

## 2014-08-31 MED ORDER — LACTATED RINGERS IV SOLN
INTRAVENOUS | Status: DC
  Administered 2014-08-31: 12:00:00 via INTRAVENOUS

## 2014-08-31 MED ORDER — KETAMINE HCL 50 MG/ML IJ SOLN
INTRAMUSCULAR | Status: DC | PRN
  Administered 2014-08-31 (×2): 25 mg via INTRAMUSCULAR

## 2014-08-31 MED ORDER — PROPOFOL INFUSION 10 MG/ML OPTIME
INTRAVENOUS | Status: DC | PRN
  Administered 2014-08-31: 120 ug/kg/min via INTRAVENOUS

## 2014-08-31 MED ORDER — CEFAZOLIN SODIUM-DEXTROSE 2-3 GM-% IV SOLR
2.0000 g | INTRAVENOUS | Status: AC
  Administered 2014-08-31: 2 g via INTRAVENOUS

## 2014-08-31 MED ORDER — MIDAZOLAM HCL 5 MG/5ML IJ SOLN
INTRAMUSCULAR | Status: DC | PRN
  Administered 2014-08-31: 1.5 mg via INTRAVENOUS

## 2014-08-31 MED ORDER — LIDOCAINE HCL (CARDIAC) 20 MG/ML IV SOLN
INTRAVENOUS | Status: DC | PRN
  Administered 2014-08-31: 100 mg via INTRAVENOUS

## 2014-08-31 MED ORDER — LIDOCAINE HCL (PF) 1 % IJ SOLN
INTRAMUSCULAR | Status: AC
  Filled 2014-08-31: qty 30

## 2014-08-31 MED ORDER — ONDANSETRON HCL 4 MG/2ML IJ SOLN: 4.0000 mg | Freq: Once | INTRAMUSCULAR | Status: DC | PRN

## 2014-08-31 MED ORDER — SODIUM CHLORIDE 0.9 % IJ SOLN
INTRAMUSCULAR | Status: DC | PRN
  Administered 2014-08-31: 20 mL via INTRAVENOUS

## 2014-08-31 SURGICAL SUPPLY — 26 items
BLADE SURG 15 STRL SS SAFETY (BLADE) ×2 IMPLANT
CHLORAPREP W/TINT 26ML (MISCELLANEOUS) ×2 IMPLANT
COVER LIGHT HANDLE STERIS (MISCELLANEOUS) ×4 IMPLANT
DECANTER SPIKE VIAL GLASS SM (MISCELLANEOUS) ×4 IMPLANT
DRAPE C-ARM XRAY 36X54 (DRAPES) ×2 IMPLANT
DRAPE LAPAROTOMY TRNSV 106X77 (MISCELLANEOUS) ×2 IMPLANT
DRESSING TELFA 4X3 1S ST N-ADH (GAUZE/BANDAGES/DRESSINGS) ×2 IMPLANT
DRSG TEGADERM 2-3/8X2-3/4 SM (GAUZE/BANDAGES/DRESSINGS) ×2 IMPLANT
DRSG TEGADERM 4X4.75 (GAUZE/BANDAGES/DRESSINGS) ×2 IMPLANT
GLOVE BIO SURGEON STRL SZ7.5 (GLOVE) ×4 IMPLANT
GLOVE INDICATOR 8.0 STRL GRN (GLOVE) ×2 IMPLANT
GOWN STRL REUS W/ TWL LRG LVL3 (GOWN DISPOSABLE) ×2 IMPLANT
GOWN STRL REUS W/TWL LRG LVL3 (GOWN DISPOSABLE) ×2
KIT RM TURNOVER STRD PROC AR (KITS) ×2 IMPLANT
LABEL OR SOLS (LABEL) ×2 IMPLANT
NS IRRIG 500ML POUR BTL (IV SOLUTION) ×2 IMPLANT
PACK PORT-A-CATH (MISCELLANEOUS) ×2 IMPLANT
PAD GROUND ADULT SPLIT (MISCELLANEOUS) ×2 IMPLANT
PORTACATH POWER 8F (Port) ×2 IMPLANT
STRIP CLOSURE SKIN 1/2X4 (GAUZE/BANDAGES/DRESSINGS) ×2 IMPLANT
SUT PROLENE 3 0 SH DA (SUTURE) ×2 IMPLANT
SUT VIC AB 3-0 SH 27 (SUTURE) ×1
SUT VIC AB 3-0 SH 27X BRD (SUTURE) ×1 IMPLANT
SUT VIC AB 4-0 FS2 27 (SUTURE) ×2 IMPLANT
SWABSTK COMLB BENZOIN TINCTURE (MISCELLANEOUS) ×2 IMPLANT
SYR CONTROL 10ML (SYRINGE) ×2 IMPLANT

## 2014-08-31 NOTE — Op Note (Addendum)
Preoperative diagnosis: Left breast cancer, need for central venous access.  Postoperative diagnosis: Same.  Operative procedure: Right subclavian PowerPort placement with ultrasound and fluoroscopic guidance.  Operating surgeon: Hervey Ard, M.D.  Anesthesia: Monitored anesthesia care, 1% Xylocaine plain, 25 mL.  Estimated blood loss: 5 mL.  Clinical note: This 64 year old woman is a candidate for adjuvant chemotherapy and Central venous access was requested by her medical oncologist.  Operative note: With the patient supine on the operating table the right neck and chest was prepped with chlor prep and drape. She received Kefzol for IV prophylaxis. Ultrasound was used to confirm patency of the right subclavian vein. This was cannulated under ultrasound guidance. A guidewire was passed followed by a dilator. Fluoroscopy was used to confirm location in the SVC. The catheter was positioned at the junction of the right atrium and SVC. It was told to a pocket on the right anterior chest. The port was anchored to the deep tissue with interrupted 3-0 Vicryl sutures. The wound was closed with a running 3-0 Vicryls sutured to the adipose tissue and a running 4-0 Vicryls sutured to the skin.  Benzoin and Steri-Strips followed by Telfa and Tegaderm dressing was applied.  The patient tolerated the procedure well and was taken to recovery in stable condition.

## 2014-08-31 NOTE — Anesthesia Preprocedure Evaluation (Addendum)
Anesthesia Evaluation  Patient identified by MRN, date of birth, ID band Patient awake    Reviewed: Allergy & Precautions, NPO status , Patient's Chart, lab work & pertinent test results  Airway Mallampati: II       Dental   Pulmonary neg pulmonary ROS, former smoker,    Pulmonary exam normal       Cardiovascular hypertension, Pt. on medications Normal cardiovascular exam    Neuro/Psych Depression negative neurological ROS     GI/Hepatic Neg liver ROS, GERD-  ,  Endo/Other  Morbid obesity  Renal/GU negative Renal ROS  negative genitourinary   Musculoskeletal negative musculoskeletal ROS (+)   Abdominal Normal abdominal exam  (+)   Peds negative pediatric ROS (+)  Hematology   Anesthesia Other Findings   Reproductive/Obstetrics negative OB ROS                            Anesthesia Physical Anesthesia Plan  ASA: III  Anesthesia Plan: MAC   Post-op Pain Management:    Induction: Intravenous  Airway Management Planned: Nasal Cannula  Additional Equipment:   Intra-op Plan:   Post-operative Plan:   Informed Consent: I have reviewed the patients History and Physical, chart, labs and discussed the procedure including the risks, benefits and alternatives for the proposed anesthesia with the patient or authorized representative who has indicated his/her understanding and acceptance.     Plan Discussed with: CRNA  Anesthesia Plan Comments:         Anesthesia Quick Evaluation

## 2014-08-31 NOTE — Anesthesia Postprocedure Evaluation (Signed)
  Anesthesia Post-op Note  Patient: Madison Christensen  Procedure(s) Performed: Procedure(s): INSERTION PORT-A-CATH (Right)  Anesthesia type:MAC  Patient location: PACU  Post pain: Pain level controlled  Post assessment: Post-op Vital signs reviewed, Patient's Cardiovascular Status Stable, Respiratory Function Stable, Patent Airway and No signs of Nausea or vomiting  Post vital signs: Reviewed and stable  Last Vitals:  Filed Vitals:   08/31/14 1425  BP: 118/61  Pulse: 92  Temp: 37.1 C  Resp: 10    Level of consciousness: awake, alert  and patient cooperative  Complications: No apparent anesthesia complications

## 2014-08-31 NOTE — Discharge Instructions (Addendum)
Apply ice to area intermittently for comfort.  May shower in any time.  Outer plastic dressings can be removed in 3 days if desired.  No driving until pain free.  Avoid repetitive activities with your right for the next few days to minimize soreness.  Resume regular medications.  Tylenol if needed for soreness.

## 2014-08-31 NOTE — H&P (Signed)
64 y/o for power port placement. No change in cardiopulmonary exam.

## 2014-08-31 NOTE — Transfer of Care (Signed)
Immediate Anesthesia Transfer of Care Note  Patient: [name]  Procedure(s) Performed: Procedure(s): INSERTION PORT-A-CATH (Right)  Patient Location: PACU  Anesthesia Type:General  Level of Consciousness: awake, alert , oriented and patient cooperative  Airway & Oxygen Therapy: Patient Spontanous Breathing and Patient connected to face mask oxygen  Post-op Assessment: Report given to RN and Post -op Vital signs reviewed and stable  Post vital signs: Reviewed and stable  Last Vitals:  Filed Vitals:   08/31/14 1425  BP: 118/61  Pulse: 92  Temp: 37.1 C  Resp: 10    Complications: No apparent anesthesia complications

## 2014-08-31 NOTE — Progress Notes (Signed)
Dr Bary Castilla in to see pt 430 pm - advises ok to d/c to home

## 2014-09-01 ENCOUNTER — Encounter: Payer: Self-pay | Admitting: General Surgery

## 2014-09-01 ENCOUNTER — Other Ambulatory Visit: Payer: Self-pay | Admitting: *Deleted

## 2014-09-01 DIAGNOSIS — C50912 Malignant neoplasm of unspecified site of left female breast: Secondary | ICD-10-CM

## 2014-09-01 MED ORDER — LIDOCAINE-PRILOCAINE 2.5-2.5 % EX CREA: 1.0000 "application " | TOPICAL_CREAM | CUTANEOUS | Status: DC | PRN

## 2014-09-01 MED ORDER — ONDANSETRON HCL 8 MG PO TABS: 8.0000 mg | ORAL_TABLET | Freq: Two times a day (BID) | ORAL | Status: DC

## 2014-09-04 ENCOUNTER — Inpatient Hospital Stay (HOSPITAL_BASED_OUTPATIENT_CLINIC_OR_DEPARTMENT_OTHER): Payer: BLUE CROSS/BLUE SHIELD | Admitting: Oncology

## 2014-09-04 ENCOUNTER — Inpatient Hospital Stay: Payer: BLUE CROSS/BLUE SHIELD

## 2014-09-04 VITALS — BP 150/80 | HR 98 | Temp 97.0°F | Wt 217.8 lb

## 2014-09-04 VITALS — BP 113/74 | HR 98 | Resp 18

## 2014-09-04 DIAGNOSIS — C50912 Malignant neoplasm of unspecified site of left female breast: Secondary | ICD-10-CM

## 2014-09-04 DIAGNOSIS — Z79899 Other long term (current) drug therapy: Secondary | ICD-10-CM

## 2014-09-04 DIAGNOSIS — Z171 Estrogen receptor negative status [ER-]: Secondary | ICD-10-CM

## 2014-09-04 DIAGNOSIS — I10 Essential (primary) hypertension: Secondary | ICD-10-CM | POA: Diagnosis not present

## 2014-09-04 DIAGNOSIS — Z85828 Personal history of other malignant neoplasm of skin: Secondary | ICD-10-CM

## 2014-09-04 DIAGNOSIS — Z803 Family history of malignant neoplasm of breast: Secondary | ICD-10-CM

## 2014-09-04 DIAGNOSIS — E785 Hyperlipidemia, unspecified: Secondary | ICD-10-CM

## 2014-09-04 DIAGNOSIS — K219 Gastro-esophageal reflux disease without esophagitis: Secondary | ICD-10-CM

## 2014-09-04 DIAGNOSIS — F329 Major depressive disorder, single episode, unspecified: Secondary | ICD-10-CM

## 2014-09-04 DIAGNOSIS — Z87891 Personal history of nicotine dependence: Secondary | ICD-10-CM

## 2014-09-04 LAB — CBC WITH DIFFERENTIAL/PLATELET
Basophils Absolute: 0 10*3/uL (ref 0–0.1)
Basophils Relative: 0 %
EOS PCT: 2 %
Eosinophils Absolute: 0.2 10*3/uL (ref 0–0.7)
HEMATOCRIT: 42.9 % (ref 35.0–47.0)
Hemoglobin: 14.3 g/dL (ref 12.0–16.0)
LYMPHS PCT: 34 %
Lymphs Abs: 3 10*3/uL (ref 1.0–3.6)
MCH: 27.8 pg (ref 26.0–34.0)
MCHC: 33.4 g/dL (ref 32.0–36.0)
MCV: 83.2 fL (ref 80.0–100.0)
MONOS PCT: 8 %
Monocytes Absolute: 0.7 10*3/uL (ref 0.2–0.9)
Neutro Abs: 4.9 10*3/uL (ref 1.4–6.5)
Neutrophils Relative %: 56 %
Platelets: 221 10*3/uL (ref 150–440)
RBC: 5.15 MIL/uL (ref 3.80–5.20)
RDW: 13.9 % (ref 11.5–14.5)
WBC: 8.8 10*3/uL (ref 3.6–11.0)

## 2014-09-04 LAB — COMPREHENSIVE METABOLIC PANEL
ALT: 16 U/L (ref 14–54)
AST: 24 U/L (ref 15–41)
Albumin: 4.2 g/dL (ref 3.5–5.0)
Alkaline Phosphatase: 56 U/L (ref 38–126)
Anion gap: 8 (ref 5–15)
BUN: 21 mg/dL — ABNORMAL HIGH (ref 6–20)
CO2: 27 mmol/L (ref 22–32)
CREATININE: 1 mg/dL (ref 0.44–1.00)
Calcium: 9.2 mg/dL (ref 8.9–10.3)
Chloride: 102 mmol/L (ref 101–111)
GFR, EST NON AFRICAN AMERICAN: 59 mL/min — AB (ref >60)
GLUCOSE: 120 mg/dL — AB (ref 65–99)
Potassium: 3.1 mmol/L — ABNORMAL LOW (ref 3.5–5.1)
SODIUM: 137 mmol/L (ref 135–145)
Total Bilirubin: 1.1 mg/dL (ref 0.3–1.2)
Total Protein: 8 g/dL (ref 6.5–8.1)

## 2014-09-04 MED ORDER — FAMOTIDINE IN NACL 20-0.9 MG/50ML-% IV SOLN
20.0000 mg | Freq: Once | INTRAVENOUS | Status: AC
  Administered 2014-09-04: 20 mg via INTRAVENOUS
  Filled 2014-09-04: qty 50

## 2014-09-04 MED ORDER — SODIUM CHLORIDE 0.9 % IJ SOLN
10.0000 mL | INTRAMUSCULAR | Status: DC | PRN
  Filled 2014-09-04: qty 10

## 2014-09-04 MED ORDER — SODIUM CHLORIDE 0.9 % IJ SOLN
10.0000 mL | Freq: Once | INTRAMUSCULAR | Status: AC
  Administered 2014-09-04: 10 mL via INTRAVENOUS
  Filled 2014-09-04: qty 10

## 2014-09-04 MED ORDER — DIPHENHYDRAMINE HCL 50 MG/ML IJ SOLN
25.0000 mg | Freq: Once | INTRAMUSCULAR | Status: AC | PRN
  Administered 2014-09-04: 25 mg via INTRAVENOUS
  Filled 2014-09-04: qty 1

## 2014-09-04 MED ORDER — DIPHENHYDRAMINE HCL 25 MG PO CAPS: 50.0000 mg | ORAL_CAPSULE | Freq: Once | ORAL | Status: DC

## 2014-09-04 MED ORDER — TRASTUZUMAB CHEMO INJECTION 440 MG
8.0000 mg/kg | Freq: Once | INTRAVENOUS | Status: AC
  Administered 2014-09-04: 798 mg via INTRAVENOUS
  Filled 2014-09-04: qty 38

## 2014-09-04 MED ORDER — ACETAMINOPHEN 325 MG PO TABS
650.0000 mg | ORAL_TABLET | Freq: Once | ORAL | Status: AC
  Administered 2014-09-04: 650 mg via ORAL
  Filled 2014-09-04: qty 2

## 2014-09-04 MED ORDER — HEPARIN SOD (PORK) LOCK FLUSH 100 UNIT/ML IV SOLN
500.0000 [IU] | Freq: Once | INTRAVENOUS | Status: AC | PRN
  Administered 2014-09-04: 500 [IU]
  Filled 2014-09-04: qty 5

## 2014-09-04 MED ORDER — SODIUM CHLORIDE 0.9 % IV SOLN
Freq: Once | INTRAVENOUS | Status: AC
  Administered 2014-09-04: 11:00:00 via INTRAVENOUS
  Filled 2014-09-04: qty 1000

## 2014-09-04 MED ORDER — DIPHENHYDRAMINE HCL 50 MG/ML IJ SOLN
50.0000 mg | Freq: Once | INTRAMUSCULAR | Status: AC
  Administered 2014-09-04: 50 mg via INTRAVENOUS
  Filled 2014-09-04: qty 1

## 2014-09-04 MED ORDER — SODIUM CHLORIDE 0.9 % IV SOLN
Freq: Once | INTRAVENOUS | Status: AC
  Administered 2014-09-04: 11:00:00 via INTRAVENOUS
  Filled 2014-09-04: qty 4

## 2014-09-04 MED ORDER — AZITHROMYCIN 500 MG PO TABS: 500.0000 mg | ORAL_TABLET | Freq: Every day | ORAL | Status: DC

## 2014-09-04 MED ORDER — DEXTROSE 5 % IV SOLN
80.0000 mg/m2 | Freq: Once | INTRAVENOUS | Status: AC
  Administered 2014-09-04: 174 mg via INTRAVENOUS
  Filled 2014-09-04: qty 29

## 2014-09-04 MED ORDER — HYDROCOD POLST-CPM POLST ER 10-8 MG/5ML PO SUER: 5.0000 mL | Freq: Two times a day (BID) | ORAL | Status: DC | PRN

## 2014-09-04 MED ORDER — SODIUM CHLORIDE 0.9 % IV SOLN
8.0000 mg | Freq: Once | INTRAVENOUS | Status: AC
  Administered 2014-09-04: 8 mg via INTRAVENOUS
  Filled 2014-09-04: qty 0.8

## 2014-09-04 NOTE — Progress Notes (Signed)
1357- Patient states, "I feel flushed, hot, and nauseas." Patient's face was visibly red/flushed. Taxol stopped. Facial flushing/redness resolved within one minute. MD, Dr. Oliva Bustard, notified via telephone. Orders received. See MAR. Patient with no further complaints at this time and states, "I feel better and no longer flushed or hot."  Per MD, Dr. Oliva Bustard, order: restart Taxol after Decadron and Benadryl have been given. Taxol restarted at 1438. No further signs/symptoms or complaints from the patient. Taxol infusion completed at 1558.

## 2014-09-04 NOTE — Progress Notes (Signed)
Patient does not have living will.  Former smoker.  Patient is having chest congestion/cough that interferes with her sleep.

## 2014-09-05 ENCOUNTER — Encounter: Payer: Self-pay | Admitting: Oncology

## 2014-09-05 NOTE — Progress Notes (Signed)
[place] @ [facility] Telephone:(336) [phone]  Fax:(336) [place] OB: 19-Oct-1950  MR#: [id number]  KPT#:[id number]  Patient Care Team: Jerrol Banana., MD as PCP - General (Family Medicine) Robert Bellow, MD as Consulting Physician (General Surgery) Nicholaus Bloom, MD as Consulting Physician (Plastic Surgery) Tammy Rondell Reams, [place] (Family Medicine)  CHIEF COMPLAINT:  Chief Complaint  Patient presents with  . Follow-up    Oncology History   1.  Abnormal mammogram in April of 2016 followed by ultrasound and based on clinical examination 1 cm tumor.  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  (July 05, 2014) 2.  Status post lumpectomy and sentinel lymph node evaluation tumor size is 1.4 cm sentinel lymph nodes negative.  Stage IC T1 cN0 M0 tumor 3.  Patient is going for breast reduction surgery in June of 2016 4.  Adjuvant chemotherapy started with Taxol and Herceptin on September 04, 2014     Carcinoma of left breast   07/05/2014 Initial Diagnosis Carcinoma of left breast    Oncology Flowsheet 09/04/2014 09/04/2014  Day, Cycle Day 1, 1    dexamethasone (DECADRON) IV [ 10 mg ] 8 mg  ondansetron (ZOFRAN) IV [ 8 mg ]    PACLitaxel (TAXOL) IV 80 mg/m2    trastuzumab (HERCEPTIN) IV 8 mg/kg      INTERVAL HISTORY: 64 year old lady underwent lumpectomy and sentinel lymph node biopsy.  Patient has stage IC disease.  Patient has been on to have reduction breast reduction surgery tomorrow. Patient and family here to discuss the results and the further planning of treatment She  underwent mammoplasty.  Wound is healing well.  Patient is here for ongoing evaluation and consideration of adjuvant treatment.  Has difficulty in sleeping somewhat depressed at number of questions.  REVIEW OF SYSTEMS:   GENERAL:  Feels good.  Active.  No fevers, sweats or weight loss. Not able  to sleep for last few days because of dry hacking cough PERFORMANCE STATUS (ECOG):  0 HEENT:  No visual changes, runny nose, sore throat, mouth sores or tenderness. Lungs: Dry hacking cough with expectoration Cardiac:  No chest pain, palpitations, orthopnea, or PND. GI:  No nausea, vomiting, diarrhea, constipation, melena or hematochezia. GU:  No urgency, frequency, dysuria, or hematuria. Musculoskeletal:  No back pain.  No joint pain.  No muscle tenderness. Extremities:  No pain or swelling. Skin:  No rashes or skin changes. Neuro:  No headache, numbness or weakness, balance or coordination issues. Endocrine:  No diabetes, thyroid issues, hot flashes or night sweats. Psych:  No mood changes, depression or anxiety. Pain:  No focal pain. Review of systems:  All other systems reviewed and found to be negative. As per HPI. Otherwise, a complete review of systems is negatve.  PAST MEDICAL HISTORY: Past Medical History  Diagnosis Date  . Hypertension   . Hyperlipidemia   . GERD (gastroesophageal reflux disease)   . Depression 1995  . Cancer     skin   . 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 Complication of anesthesia 2004    oversensitive to noise, lights    PAST SURGICAL HISTORY: Past Surgical History  Procedure Laterality Date  . Back surgery    . Tonsillectomy    . Skin cancer  removal  2015    nose   .  Colonoscopy  2013    Dr. Vira Agar  . Breast lumpectomy with axillary lymph node dissection Left 08/03/2014    Procedure: BREAST LUMPECTOMY WITH AXILLARY LYMPH NODE DISSECTION;  Surgeon: Robert Bellow, MD;  Location: [facility] OR[facility];  Service: General;  Laterality: Left;  . Sentinel node biopsy Left 08/03/2014    Procedure: SENTINEL NODE BIOPSY;  Surgeon: Robert Bellow, MD;  Location: [facility] OR[facility];  Service: General;  Laterality: Left;  . Axillary lymph node biopsy Left 08/03/2014    Procedure: AXILLARY LYMPH NODE BIOPSY;   Surgeon: Robert Bellow, MD;  Location: [facility];  Service: General;  Laterality: Left;  . Breast surgery Left Aug 03, 2014    Wide excision, sentinel node biopsy.  . Portacath placement Right 08/31/2014    Procedure: INSERTION PORT-A-CATH;  Surgeon: Robert Bellow, MD;  Location: [facility] OR[facility];  Service: General;  Laterality: Right;    FAMILY HISTORY Family History  Problem Relation Age of Onset  . Breast cancer Mother 44       . Breast cancer Cousin 62         ADVANCED DIRECTIVES:  Patient does have advanced health care directive  HEALTH MAINTENANCE: History  Substance Use Topics  . Smoking status: Former Smoker -- 1.00 packs/day for 2 years    Types: Cigarettes    Quit date: 07/30/1976  . Smokeless tobacco: Not on file  . Alcohol Use: 0.0 - 1.2 oz/week    0-1 Glasses of wine, 0-1 Shots of liquor, 0 Standard drinks or equivalent per week      Allergies  Allergen Reactions  . Sulfa Antibiotics Hives and Rash    Current Outpatient Prescriptions  Medication Sig Dispense Refill  . cephALEXin (KEFLEX) 500 MG capsule Take 500 mg by mouth 2 (two) times daily.    Marland Kitchen docusate sodium (COLACE) 100 MG capsule Take 100 mg by mouth daily as needed for mild constipation.    Marland Kitchen HYDROcodone-acetaminophen (NORCO) 5-325 MG per tablet Take 1-2 tablets by mouth every 4 (four) hours as needed for moderate pain or severe pain. 30 tablet 0  . KLOR-CON M20 20 MEQ tablet Take 20 mEq by mouth every morning.   4  . lidocaine-prilocaine (EMLA) cream Apply 1 application topically as needed. 30 g 3  . omeprazole (PRILOSEC) 40 MG capsule Take 40 mg by mouth every morning.   4  . ondansetron (ZOFRAN) 8 MG tablet Take 1 tablet (8 mg total) by mouth 2 (two) times daily. Start the day after chemo for 2 days. Then take as needed for nausea or vomiting. 30 tablet 1  . pravastatin (PRAVACHOL) 40 MG tablet Take 40 mg by mouth every morning.   3  . sertraline (ZOLOFT) 100 MG tablet Take 100 mg by mouth every  morning.   4  . triamterene-hydrochlorothiazide (MAXZIDE) 75-50 MG per tablet Take 1 tablet by mouth every morning.   4  . azithromycin (ZITHROMAX) 500 MG tablet Take 1 tablet (500 mg total) by mouth daily. 3 tablet 0  . cetirizine (ZYRTEC) 10 MG tablet Take 10 mg by mouth every morning. As needed for seasonal allergies.  3  . chlorpheniramine-HYDROcodone (TUSSIONEX) 10-8 MG/5ML SUER Take 5 mLs by mouth every 12 (twelve) hours as needed for cough. 140 mL 0   No current facility-administered medications for this visit.    OBJECTIVE:  Filed Vitals:   09/04/14 0929  BP: 150/80  Pulse: 98  Temp: 97 F (36.1 C)     Body mass index  is 36.25 kg/(m^2).    ECOG FS:0 - Asymptomatic  PHYSICAL EXAM: General  status: Performance status is good.  Patient has not lost significant weight HEENT: No evidence of stomatitis. Sclera and conjunctivae :: No jaundice.   pale looking. Lungs: Air  entry equal on both sides.  No rhonchi.  No rales.  Cardiac: Heart sounds are normal.  No pericardial rub.  No murmur. Lymphatic system: Cervical, axillary, inguinal, lymph nodes not palpable GI: Abdomen is soft.  No ascites.  Liver spleen not palpable.  No tenderness.  Bowel sounds are within normal limit Lower extremity: No edema Neurological system: Higher functions, cranial nerves intact no evidence of peripheral neuropathy. Skin: No rash.  No ecchymosis.. Bilateral breast mammoplasty wound is healing well. Port site within normal limit healing well.    LAB RESULTS:  Infusion on 09/04/2014  Component Date Value Ref Range Status  . WBC 09/04/2014 8.8  3.6 - 11.0 K/uL Final   A-LINE DRAW  . RBC 09/04/2014 5.15  3.80 - 5.20 MIL/uL Final  . Hemoglobin 09/04/2014 14.3  12.0 - 16.0 g/dL Final  . HCT 09/04/2014 42.9  35.0 - 47.0 % Final  . MCV 09/04/2014 83.2  80.0 - 100.0 fL Final  . MCH 09/04/2014 27.8  26.0 - 34.0 pg Final  . MCHC 09/04/2014 33.4  32.0 - 36.0 g/dL Final  . RDW 09/04/2014 13.9  11.5 -  14.5 % Final  . Platelets 09/04/2014 221  150 - 440 K/uL Final  . Neutrophils Relative % 09/04/2014 56   Final  . Neutro Abs 09/04/2014 4.9  1.4 - 6.5 K/uL Final  . Lymphocytes Relative 09/04/2014 34   Final  . Lymphs Abs 09/04/2014 3.0  1.0 - 3.6 K/uL Final  . Monocytes Relative 09/04/2014 8   Final  . Monocytes Absolute 09/04/2014 0.7  0.2 - 0.9 K/uL Final  . Eosinophils Relative 09/04/2014 2   Final  . Eosinophils Absolute 09/04/2014 0.2  0 - 0.7 K/uL Final  . Basophils Relative 09/04/2014 0   Final  . Basophils Absolute 09/04/2014 0.0  0 - 0.1 K/uL Final  . Sodium 09/04/2014 137  135 - 145 mmol/L Final  . Potassium 09/04/2014 3.1* 3.5 - 5.1 mmol/L Final  . Chloride 09/04/2014 102  101 - 111 mmol/L Final  . CO2 09/04/2014 27  22 - 32 mmol/L Final  . Glucose, Bld 09/04/2014 120* 65 - 99 mg/dL Final  . BUN 09/04/2014 21* 6 - 20 mg/dL Final  . Creatinine, Ser 09/04/2014 1.00  0.44 - 1.00 mg/dL Final  . Calcium 09/04/2014 9.2  8.9 - 10.3 mg/dL Final  . Total Protein 09/04/2014 8.0  6.5 - 8.1 g/dL Final  . Albumin 09/04/2014 4.2  3.5 - 5.0 g/dL Final  . AST 09/04/2014 24  15 - 41 U/L Final  . ALT 09/04/2014 16  14 - 54 U/L Final  . Alkaline Phosphatase 09/04/2014 56  38 - 126 U/L Final  . Total Bilirubin 09/04/2014 1.1  0.3 - 1.2 mg/dL Final  . GFR calc non Af Amer 09/04/2014 59* >60 mL/min Final  . GFR calc Af Amer 09/04/2014 >60  >60 mL/min Final   Comment: (NOTE) The eGFR has been calculated using the CKD EPI equation. This calculation has not been validated in all clinical situations. eGFR's persistently <60 mL/min signify possible Chronic Kidney Disease.   . Anion gap 09/04/2014 8  5 - 15 Final      STUDIES: Dg Chest Port 1 View  08/31/2014  CLINICAL DATA:  Port-A-Cath placement.  EXAM: PORTABLE CHEST - 1 VIEW  COMPARISON:  None.  FINDINGS: Right subclavian Port-A-Cath is in place. Tubing is intact. Tip of the catheter projects just above the superior cavoatrial junction.  The lungs are clear. Heart size is normal. No pneumothorax or pleural effusion.  IMPRESSION: Tip of new Port-A-Cath projects just above the superior cavoatrial junction. Negative for pneumothorax.   Electronically Signed   By: Inge Rise M.D.   On: 08/31/2014 15:20   Dg C-arm 1-60 Min-no Report  08/31/2014   CLINICAL DATA: port a cath insertiion   C-ARM 1-60 MINUTES  Fluoroscopy was utilized by the requesting physician.  No radiographic  interpretation.     ASSESSMENT: Carcinoma of breast status post lumpectomy and sentinel lymph node (left breast) stage IC Estrogen and progesterone receptor negative HER-2/neu positive tumor Status post mammoplasty on both breast  MEDICAL DECISION MAKING:  All lab data has been reviewed Proceed with chemotherapy Taxol weekly 12 and Herceptin Radiation therapy after Taxol treatment is finished Continue Herceptin for 1 year Total duration of visit was 45 minutes.  50% or more time was spent in counseling patient and family regarding prognosis and options of treatment and available resources   Patient expressed understanding and was in agreement with this plan. She also understands that She can call clinic at any time with any questions, concerns, or complaints.    Carcinoma of left breast   Staging form: Breast, AJCC 7th Edition     Clinical: Stage IA (T1c, N0, M0) - Marni Griffon, MD   09/05/2014 8:05 AM

## 2014-09-12 ENCOUNTER — Inpatient Hospital Stay: Payer: BLUE CROSS/BLUE SHIELD

## 2014-09-12 ENCOUNTER — Inpatient Hospital Stay: Payer: BLUE CROSS/BLUE SHIELD | Attending: Oncology

## 2014-09-12 VITALS — BP 105/65 | HR 70 | Temp 98.0°F | Resp 18

## 2014-09-12 DIAGNOSIS — F329 Major depressive disorder, single episode, unspecified: Secondary | ICD-10-CM | POA: Diagnosis not present

## 2014-09-12 DIAGNOSIS — I10 Essential (primary) hypertension: Secondary | ICD-10-CM | POA: Diagnosis not present

## 2014-09-12 DIAGNOSIS — E785 Hyperlipidemia, unspecified: Secondary | ICD-10-CM | POA: Insufficient documentation

## 2014-09-12 DIAGNOSIS — R197 Diarrhea, unspecified: Secondary | ICD-10-CM | POA: Diagnosis not present

## 2014-09-12 DIAGNOSIS — C50912 Malignant neoplasm of unspecified site of left female breast: Secondary | ICD-10-CM | POA: Insufficient documentation

## 2014-09-12 DIAGNOSIS — Z5111 Encounter for antineoplastic chemotherapy: Secondary | ICD-10-CM | POA: Diagnosis not present

## 2014-09-12 DIAGNOSIS — Z87891 Personal history of nicotine dependence: Secondary | ICD-10-CM | POA: Diagnosis not present

## 2014-09-12 DIAGNOSIS — Z171 Estrogen receptor negative status [ER-]: Secondary | ICD-10-CM | POA: Insufficient documentation

## 2014-09-12 DIAGNOSIS — K219 Gastro-esophageal reflux disease without esophagitis: Secondary | ICD-10-CM | POA: Diagnosis not present

## 2014-09-12 DIAGNOSIS — Z85828 Personal history of other malignant neoplasm of skin: Secondary | ICD-10-CM | POA: Insufficient documentation

## 2014-09-12 DIAGNOSIS — Z79899 Other long term (current) drug therapy: Secondary | ICD-10-CM | POA: Insufficient documentation

## 2014-09-12 DIAGNOSIS — M791 Myalgia: Secondary | ICD-10-CM | POA: Insufficient documentation

## 2014-09-12 DIAGNOSIS — G4700 Insomnia, unspecified: Secondary | ICD-10-CM | POA: Diagnosis not present

## 2014-09-12 LAB — COMPREHENSIVE METABOLIC PANEL
ALK PHOS: 50 U/L (ref 38–126)
ALT: 27 U/L (ref 14–54)
AST: 26 U/L (ref 15–41)
Albumin: 4 g/dL (ref 3.5–5.0)
Anion gap: 6 (ref 5–15)
BUN: 18 mg/dL (ref 6–20)
CO2: 28 mmol/L (ref 22–32)
Calcium: 8.5 mg/dL — ABNORMAL LOW (ref 8.9–10.3)
Chloride: 102 mmol/L (ref 101–111)
Creatinine, Ser: 1.2 mg/dL — ABNORMAL HIGH (ref 0.44–1.00)
GFR calc non Af Amer: 47 mL/min — ABNORMAL LOW (ref >60)
GFR, EST AFRICAN AMERICAN: 55 mL/min — AB (ref >60)
GLUCOSE: 87 mg/dL (ref 65–99)
Potassium: 3.6 mmol/L (ref 3.5–5.1)
SODIUM: 136 mmol/L (ref 135–145)
Total Bilirubin: 0.9 mg/dL (ref 0.3–1.2)
Total Protein: 7.2 g/dL (ref 6.5–8.1)

## 2014-09-12 LAB — CBC WITH DIFFERENTIAL/PLATELET
BASOS ABS: 0.1 10*3/uL (ref 0–0.1)
BASOS PCT: 1 %
Eosinophils Absolute: 0.2 10*3/uL (ref 0–0.7)
Eosinophils Relative: 2 %
HCT: 40.3 % (ref 35.0–47.0)
Hemoglobin: 13.3 g/dL (ref 12.0–16.0)
LYMPHS PCT: 36 %
Lymphs Abs: 2.7 10*3/uL (ref 1.0–3.6)
MCH: 27.8 pg (ref 26.0–34.0)
MCHC: 33 g/dL (ref 32.0–36.0)
MCV: 84.1 fL (ref 80.0–100.0)
MONOS PCT: 5 %
Monocytes Absolute: 0.4 10*3/uL (ref 0.2–0.9)
NEUTROS ABS: 4.1 10*3/uL (ref 1.4–6.5)
Neutrophils Relative %: 56 %
Platelets: 210 10*3/uL (ref 150–440)
RBC: 4.79 MIL/uL (ref 3.80–5.20)
RDW: 13.9 % (ref 11.5–14.5)
WBC: 7.4 10*3/uL (ref 3.6–11.0)

## 2014-09-12 MED ORDER — HEPARIN SOD (PORK) LOCK FLUSH 100 UNIT/ML IV SOLN
500.0000 [IU] | Freq: Once | INTRAVENOUS | Status: AC | PRN
  Administered 2014-09-12: 500 [IU]
  Filled 2014-09-12: qty 5

## 2014-09-12 MED ORDER — FAMOTIDINE IN NACL 20-0.9 MG/50ML-% IV SOLN
20.0000 mg | Freq: Once | INTRAVENOUS | Status: AC
  Administered 2014-09-12: 20 mg via INTRAVENOUS
  Filled 2014-09-12: qty 50

## 2014-09-12 MED ORDER — PACLITAXEL CHEMO INJECTION 300 MG/50ML
80.0000 mg/m2 | Freq: Once | INTRAVENOUS | Status: AC
  Administered 2014-09-12: 174 mg via INTRAVENOUS
  Filled 2014-09-12: qty 29

## 2014-09-12 MED ORDER — SODIUM CHLORIDE 0.9 % IV SOLN
Freq: Once | INTRAVENOUS | Status: AC
  Administered 2014-09-12: 14:00:00 via INTRAVENOUS
  Filled 2014-09-12: qty 1000

## 2014-09-12 MED ORDER — DIPHENHYDRAMINE HCL 50 MG/ML IJ SOLN
50.0000 mg | Freq: Once | INTRAMUSCULAR | Status: AC
  Administered 2014-09-12: 50 mg via INTRAVENOUS
  Filled 2014-09-12: qty 1

## 2014-09-12 MED ORDER — SODIUM CHLORIDE 0.9 % IV SOLN
Freq: Once | INTRAVENOUS | Status: AC
  Administered 2014-09-12: 14:00:00 via INTRAVENOUS
  Filled 2014-09-12: qty 4

## 2014-09-12 MED ORDER — SODIUM CHLORIDE 0.9 % IJ SOLN
10.0000 mL | INTRAMUSCULAR | Status: DC | PRN
  Filled 2014-09-12: qty 10

## 2014-09-15 ENCOUNTER — Other Ambulatory Visit: Payer: Self-pay | Admitting: *Deleted

## 2014-09-15 DIAGNOSIS — C50912 Malignant neoplasm of unspecified site of left female breast: Secondary | ICD-10-CM

## 2014-09-18 ENCOUNTER — Inpatient Hospital Stay: Payer: BLUE CROSS/BLUE SHIELD

## 2014-09-18 ENCOUNTER — Encounter: Payer: Self-pay | Admitting: Oncology

## 2014-09-18 ENCOUNTER — Inpatient Hospital Stay (HOSPITAL_BASED_OUTPATIENT_CLINIC_OR_DEPARTMENT_OTHER): Payer: BLUE CROSS/BLUE SHIELD | Admitting: Oncology

## 2014-09-18 VITALS — BP 135/83 | HR 103 | Temp 97.6°F | Wt 220.7 lb

## 2014-09-18 DIAGNOSIS — Z171 Estrogen receptor negative status [ER-]: Secondary | ICD-10-CM

## 2014-09-18 DIAGNOSIS — I10 Essential (primary) hypertension: Secondary | ICD-10-CM

## 2014-09-18 DIAGNOSIS — R197 Diarrhea, unspecified: Secondary | ICD-10-CM | POA: Diagnosis not present

## 2014-09-18 DIAGNOSIS — C50912 Malignant neoplasm of unspecified site of left female breast: Secondary | ICD-10-CM | POA: Diagnosis not present

## 2014-09-18 DIAGNOSIS — Z79899 Other long term (current) drug therapy: Secondary | ICD-10-CM

## 2014-09-18 DIAGNOSIS — F329 Major depressive disorder, single episode, unspecified: Secondary | ICD-10-CM

## 2014-09-18 DIAGNOSIS — E785 Hyperlipidemia, unspecified: Secondary | ICD-10-CM

## 2014-09-18 DIAGNOSIS — K219 Gastro-esophageal reflux disease without esophagitis: Secondary | ICD-10-CM

## 2014-09-18 DIAGNOSIS — Z85828 Personal history of other malignant neoplasm of skin: Secondary | ICD-10-CM

## 2014-09-18 DIAGNOSIS — Z87891 Personal history of nicotine dependence: Secondary | ICD-10-CM

## 2014-09-18 LAB — CBC WITH DIFFERENTIAL/PLATELET
BASOS PCT: 3 %
Basophils Absolute: 0.2 10*3/uL — ABNORMAL HIGH (ref 0–0.1)
EOS ABS: 0.1 10*3/uL (ref 0–0.7)
Eosinophils Relative: 1 %
HEMATOCRIT: 40.1 % (ref 35.0–47.0)
HEMOGLOBIN: 13.1 g/dL (ref 12.0–16.0)
LYMPHS ABS: 2.6 10*3/uL (ref 1.0–3.6)
Lymphocytes Relative: 47 %
MCH: 27.7 pg (ref 26.0–34.0)
MCHC: 32.6 g/dL (ref 32.0–36.0)
MCV: 85.2 fL (ref 80.0–100.0)
Monocytes Absolute: 0.3 10*3/uL (ref 0.2–0.9)
Monocytes Relative: 5 %
Neutro Abs: 2.4 10*3/uL (ref 1.4–6.5)
Neutrophils Relative %: 44 %
Platelets: 223 10*3/uL (ref 150–440)
RBC: 4.71 MIL/uL (ref 3.80–5.20)
RDW: 13.9 % (ref 11.5–14.5)
WBC: 5.5 10*3/uL (ref 3.6–11.0)

## 2014-09-18 MED ORDER — PACLITAXEL CHEMO INJECTION 300 MG/50ML
80.0000 mg/m2 | Freq: Once | INTRAVENOUS | Status: AC
  Administered 2014-09-18: 174 mg via INTRAVENOUS
  Filled 2014-09-18: qty 29

## 2014-09-18 MED ORDER — FAMOTIDINE IN NACL 20-0.9 MG/50ML-% IV SOLN
20.0000 mg | Freq: Once | INTRAVENOUS | Status: AC
Start: 2014-09-18 — End: 2014-09-18
  Administered 2014-09-18: 20 mg via INTRAVENOUS
  Filled 2014-09-18: qty 50

## 2014-09-18 MED ORDER — SODIUM CHLORIDE 0.9 % IV SOLN
Freq: Once | INTRAVENOUS | Status: AC
  Administered 2014-09-18: 14:00:00 via INTRAVENOUS
  Filled 2014-09-18: qty 1000

## 2014-09-18 MED ORDER — HEPARIN SOD (PORK) LOCK FLUSH 100 UNIT/ML IV SOLN
500.0000 [IU] | Freq: Once | INTRAVENOUS | Status: AC | PRN
  Administered 2014-09-18: 500 [IU]
  Filled 2014-09-18: qty 5

## 2014-09-18 MED ORDER — SODIUM CHLORIDE 0.9 % IJ SOLN
10.0000 mL | INTRAMUSCULAR | Status: DC | PRN
  Administered 2014-09-18: 10 mL
  Filled 2014-09-18: qty 10

## 2014-09-18 MED ORDER — SODIUM CHLORIDE 0.9 % IV SOLN
Freq: Once | INTRAVENOUS | Status: AC
  Administered 2014-09-18: 15:00:00 via INTRAVENOUS
  Filled 2014-09-18: qty 4

## 2014-09-18 MED ORDER — DIPHENHYDRAMINE HCL 50 MG/ML IJ SOLN
50.0000 mg | Freq: Once | INTRAMUSCULAR | Status: AC
  Administered 2014-09-18: 50 mg via INTRAVENOUS
  Filled 2014-09-18: qty 1

## 2014-09-18 NOTE — Progress Notes (Signed)
[place] @ [facility] Telephone:(336) [phone]  Fax:(336) [place] OB: 27-Jan-1951  MR#: [id number]  LPF#:[id number]  Patient Care Team: Jerrol Banana., MD as PCP - General (Family Medicine) Robert Bellow, MD as Consulting Physician (General Surgery) Nicholaus Bloom, MD as Consulting Physician (Plastic Surgery) Tammy Rondell Reams, [place] (Family Medicine)  CHIEF COMPLAINT:  Chief Complaint  Patient presents with  . Follow-up    Oncology History   1.  Abnormal mammogram in April of 2016 followed by ultrasound and based on clinical examination 1 cm tumor.  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  (July 05, 2014) 2.  Status post lumpectomy and sentinel lymph node evaluation tumor size is 1.4 cm sentinel lymph nodes negative.  Stage IC T1 cN0 M0 tumor 3.  Patient is going for breast reduction surgery in June of 2016 4.  Adjuvant chemotherapy started with Taxol and Herceptin on September 04, 2014     Carcinoma of left breast   07/05/2014 Initial Diagnosis Carcinoma of left breast    Oncology Flowsheet 09/04/2014 09/04/2014 09/12/2014 09/18/2014  Day, Cycle Day 1, 1   Day 8, 1 Day 15, 1  dexamethasone (DECADRON) IV [ 10 mg ] 8 mg [ 10 mg ] [ 10 mg ]  ondansetron (ZOFRAN) IV [ 8 mg ]   [ 8 mg ] [ 8 mg ]  PACLitaxel (TAXOL) IV 80 mg/m2   80 mg/m2 80 mg/m2  trastuzumab (HERCEPTIN) IV 8 mg/kg   - -    INTERVAL HISTORY: 64 year old lady underwent lumpectomy and sentinel lymph node biopsy.  Patient has stage IC disease.  Patient has been on to have reduction breast reduction surgery tomorrow. Patient and family here to discuss the results and the further planning of treatment She  underwent mammoplasty.  Wound is healing well.  Patient is here for ongoing evaluation and consideration of adjuvant treatment.  Has difficulty in sleeping somewhat depressed at number of  questions. September 18, 2014 Patient complains of diarrhea.  Lasting for a few days.  Did not take any Imodium.  Patient continues to be very depressed Chills.  No fever.  Her nausea.  Here for continuation of second cycle of chemotherapy  REVIEW OF SYSTEMS:   GENERAL:  Feels good.  Active.  No fevers, sweats or weight loss.  PERFORMANCE STATUS (ECOG):  0 HEENT:  No visual changes, runny nose, sore throat, mouth sores or tenderness. Lungs: Dry hacking cough with expectoration Cardiac:  No chest pain, palpitations, orthopnea, or PND. GI:  No nausea, vomiting, complains of diarrhea this started 2 days after chemotherapy lasting for 2 days.  Still has 1 or 2 loose stool.  Did not take any Imodium.  No abdominal pain. GU:  No urgency, frequency, dysuria, or hematuria. Musculoskeletal:  No back pain.  No joint pain.  No muscle tenderness. Extremities:  No pain or swelling. Skin:  No rashes or skin changes. Neuro:  No headache, numbness or weakness, balance or coordination issues. Endocrine:  No diabetes, thyroid issues, hot flashes or night sweats. Psych:  No mood changes, depression or anxiety. Pain:  No focal pain. Review of systems:  All other systems reviewed and found to be negative. As per HPI. Otherwise, a complete review of systems is negatve.  PAST MEDICAL HISTORY: Past Medical History  Diagnosis Date  . Hypertension   . Hyperlipidemia   . GERD (gastroesophageal reflux  disease)   . Depression 1995  . Cancer     skin   . 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 Complication of anesthesia 2004    oversensitive to noise, lights    PAST SURGICAL HISTORY: Past Surgical History  Procedure Laterality Date  . Back surgery    . Tonsillectomy    . Skin cancer  removal  2015    nose   . Colonoscopy  2013    Dr. Vira Agar  . Breast lumpectomy with axillary lymph node dissection Left 08/03/2014    Procedure: BREAST LUMPECTOMY  WITH AXILLARY LYMPH NODE DISSECTION;  Surgeon: Robert Bellow, MD;  Location: [facility] OR[facility];  Service: General;  Laterality: Left;  . Sentinel node biopsy Left 08/03/2014    Procedure: SENTINEL NODE BIOPSY;  Surgeon: Robert Bellow, MD;  Location: [facility] OR[facility];  Service: General;  Laterality: Left;  . Axillary lymph node biopsy Left 08/03/2014    Procedure: AXILLARY LYMPH NODE BIOPSY;  Surgeon: Robert Bellow, MD;  Location: [facility];  Service: General;  Laterality: Left;  . Breast surgery Left Aug 03, 2014    Wide excision, sentinel node biopsy.  . Portacath placement Right 08/31/2014    Procedure: INSERTION PORT-A-CATH;  Surgeon: Robert Bellow, MD;  Location: [facility] OR[facility];  Service: General;  Laterality: Right;    FAMILY HISTORY Family History  Problem Relation Age of Onset  . Breast cancer Mother 14       . Breast cancer Cousin 62         ADVANCED DIRECTIVES:  Patient does have advanced health care directive  HEALTH MAINTENANCE: History  Substance Use Topics  . Smoking status: Former Smoker -- 1.00 packs/day for 2 years    Types: Cigarettes    Quit date: 07/30/1976  . Smokeless tobacco: Not on file  . Alcohol Use: 0.0 - 1.2 oz/week    0-1 Glasses of wine, 0-1 Shots of liquor, 0 Standard drinks or equivalent per week      Allergies  Allergen Reactions  . Sulfa Antibiotics Hives and Rash    Current Outpatient Prescriptions  Medication Sig Dispense Refill  . cephALEXin (KEFLEX) 500 MG capsule Take 500 mg by mouth 2 (two) times daily.    . cetirizine (ZYRTEC) 10 MG tablet Take 10 mg by mouth every morning. As needed for seasonal allergies.  3  . chlorpheniramine-HYDROcodone (TUSSIONEX) 10-8 MG/5ML SUER Take 5 mLs by mouth every 12 (twelve) hours as needed for cough. 140 mL 0  . docusate sodium (COLACE) 100 MG capsule Take 100 mg by mouth daily as needed for mild constipation.    Marland Kitchen HYDROcodone-acetaminophen (NORCO) 5-325 MG per tablet Take 1-2 tablets by mouth every 4 (four)  hours as needed for moderate pain or severe pain. 30 tablet 0  . KLOR-CON M20 20 MEQ tablet Take 20 mEq by mouth every morning.   4  . lidocaine-prilocaine (EMLA) cream Apply 1 application topically as needed. 30 g 3  . omeprazole (PRILOSEC) 40 MG capsule Take 40 mg by mouth every morning.   4  . ondansetron (ZOFRAN) 8 MG tablet Take 1 tablet (8 mg total) by mouth 2 (two) times daily. Start the day after chemo for 2 days. Then take as needed for nausea or vomiting. 30 tablet 1  . pravastatin (PRAVACHOL) 40 MG tablet Take 40 mg by mouth every morning.   3  . sertraline (ZOLOFT) 100 MG  tablet Take 100 mg by mouth every morning.   4  . triamterene-hydrochlorothiazide (MAXZIDE) 75-50 MG per tablet Take 1 tablet by mouth every morning.   4  . azithromycin (ZITHROMAX) 500 MG tablet Take 1 tablet (500 mg total) by mouth daily. (Patient not taking: Reported on 09/18/2014) 3 tablet 0   No current facility-administered medications for this visit.    OBJECTIVE:  Filed Vitals:   09/18/14 1350  BP: 135/83  Pulse: 103  Temp: 97.6 F (36.4 C)     Body mass index is 36.72 kg/(m^2).    ECOG FS:0 - Asymptomatic  PHYSICAL EXAM: General  status: Performance status is good.  Patient has not lost significant weight HEENT: No evidence of stomatitis. Sclera and conjunctivae :: No jaundice.   pale looking. Lungs: Air  entry equal on both sides.  No rhonchi.  No rales.  Cardiac: Heart sounds are normal.  No pericardial rub.  No murmur. Lymphatic system: Cervical, axillary, inguinal, lymph nodes not palpable GI: Abdomen is soft.  No ascites.  Liver spleen not palpable.  No tenderness.  Bowel sounds are within normal limit Lower extremity: No edema Neurological system: Higher functions, cranial nerves intact no evidence of peripheral neuropathy. Skin: No rash.  No ecchymosis.. Bilateral breast mammoplasty wound is healing well. Port site within normal limit healing well.    LAB RESULTS:  Infusion on  09/18/2014  Component Date Value Ref Range Status  . WBC 09/18/2014 5.5  3.6 - 11.0 K/uL Final   A-LINE DRAW  . RBC 09/18/2014 4.71  3.80 - 5.20 MIL/uL Final  . Hemoglobin 09/18/2014 13.1  12.0 - 16.0 g/dL Final  . HCT 09/18/2014 40.1  35.0 - 47.0 % Final  . MCV 09/18/2014 85.2  80.0 - 100.0 fL Final  . MCH 09/18/2014 27.7  26.0 - 34.0 pg Final  . MCHC 09/18/2014 32.6  32.0 - 36.0 g/dL Final  . RDW 09/18/2014 13.9  11.5 - 14.5 % Final  . Platelets 09/18/2014 223  150 - 440 K/uL Final  . Neutrophils Relative % 09/18/2014 44   Final  . Neutro Abs 09/18/2014 2.4  1.4 - 6.5 K/uL Final  . Lymphocytes Relative 09/18/2014 47   Final  . Lymphs Abs 09/18/2014 2.6  1.0 - 3.6 K/uL Final  . Monocytes Relative 09/18/2014 5   Final  . Monocytes Absolute 09/18/2014 0.3  0.2 - 0.9 K/uL Final  . Eosinophils Relative 09/18/2014 1   Final  . Eosinophils Absolute 09/18/2014 0.1  0 - 0.7 K/uL Final  . Basophils Relative 09/18/2014 3   Final  . Basophils Absolute 09/18/2014 0.2* 0 - 0.1 K/uL Final      STUDIES: Dg Chest Port 1 View  08/31/2014   CLINICAL DATA:  Port-A-Cath placement.  EXAM: PORTABLE CHEST - 1 VIEW  COMPARISON:  None.  FINDINGS: Right subclavian Port-A-Cath is in place. Tubing is intact. Tip of the catheter projects just above the superior cavoatrial junction. The lungs are clear. Heart size is normal. No pneumothorax or pleural effusion.  IMPRESSION: Tip of new Port-A-Cath projects just above the superior cavoatrial junction. Negative for pneumothorax.   Electronically Signed   By: Inge Rise M.D.   On: 08/31/2014 15:20   Dg C-arm 1-60 Min-no Report  08/31/2014   CLINICAL DATA: port a cath insertiion   C-ARM 1-60 MINUTES  Fluoroscopy was utilized by the requesting physician.  No radiographic  interpretation.     ASSESSMENT: Carcinoma of breast status post lumpectomy and sentinel  lymph node (left breast) stage IC Estrogen and progesterone receptor negative HER-2/neu positive  tumor Status post mammoplasty on both breast  MEDICAL DECISION MAKING:  she has diarrhea.  Was taking Flomax.  If diarrhea continues to be checked for C. difficile.  Will continue chemotherapy. All lab data has been reviewed   Patient expressed understanding and was in agreement with this plan. She also understands that She can call clinic at any time with any questions, concerns, or complaints.    Carcinoma of left breast   Staging form: Breast, AJCC 7th Edition     Clinical: Stage IA (T1c, N0, M0) - Marni Griffon, MD   09/18/2014 8:36 PM

## 2014-09-18 NOTE — Progress Notes (Signed)
Patient does not have living will. In process of getting one done.  Having diarrhea the day after chemo.  C/o shakiness.

## 2014-09-25 ENCOUNTER — Inpatient Hospital Stay: Payer: BLUE CROSS/BLUE SHIELD

## 2014-09-25 ENCOUNTER — Inpatient Hospital Stay (HOSPITAL_BASED_OUTPATIENT_CLINIC_OR_DEPARTMENT_OTHER): Payer: BLUE CROSS/BLUE SHIELD | Admitting: Oncology

## 2014-09-25 ENCOUNTER — Encounter: Payer: Self-pay | Admitting: Oncology

## 2014-09-25 VITALS — BP 135/82 | HR 76 | Temp 96.6°F | Resp 20 | Wt 222.7 lb

## 2014-09-25 VITALS — BP 133/77 | HR 86 | Resp 20

## 2014-09-25 DIAGNOSIS — K219 Gastro-esophageal reflux disease without esophagitis: Secondary | ICD-10-CM

## 2014-09-25 DIAGNOSIS — Z79899 Other long term (current) drug therapy: Secondary | ICD-10-CM | POA: Diagnosis not present

## 2014-09-25 DIAGNOSIS — M791 Myalgia: Secondary | ICD-10-CM | POA: Diagnosis not present

## 2014-09-25 DIAGNOSIS — I10 Essential (primary) hypertension: Secondary | ICD-10-CM

## 2014-09-25 DIAGNOSIS — Z171 Estrogen receptor negative status [ER-]: Secondary | ICD-10-CM | POA: Diagnosis not present

## 2014-09-25 DIAGNOSIS — E785 Hyperlipidemia, unspecified: Secondary | ICD-10-CM

## 2014-09-25 DIAGNOSIS — C50912 Malignant neoplasm of unspecified site of left female breast: Secondary | ICD-10-CM | POA: Diagnosis not present

## 2014-09-25 DIAGNOSIS — R197 Diarrhea, unspecified: Secondary | ICD-10-CM

## 2014-09-25 DIAGNOSIS — F329 Major depressive disorder, single episode, unspecified: Secondary | ICD-10-CM

## 2014-09-25 DIAGNOSIS — Z87891 Personal history of nicotine dependence: Secondary | ICD-10-CM

## 2014-09-25 DIAGNOSIS — G4700 Insomnia, unspecified: Secondary | ICD-10-CM

## 2014-09-25 DIAGNOSIS — Z85828 Personal history of other malignant neoplasm of skin: Secondary | ICD-10-CM

## 2014-09-25 LAB — COMPREHENSIVE METABOLIC PANEL
ALBUMIN: 4.2 g/dL (ref 3.5–5.0)
ALK PHOS: 48 U/L (ref 38–126)
ALT: 23 U/L (ref 14–54)
AST: 28 U/L (ref 15–41)
Anion gap: 7 (ref 5–15)
BUN: 18 mg/dL (ref 6–20)
CO2: 28 mmol/L (ref 22–32)
Calcium: 9.1 mg/dL (ref 8.9–10.3)
Chloride: 103 mmol/L (ref 101–111)
Creatinine, Ser: 1.27 mg/dL — ABNORMAL HIGH (ref 0.44–1.00)
GFR calc Af Amer: 51 mL/min — ABNORMAL LOW (ref >60)
GFR calc non Af Amer: 44 mL/min — ABNORMAL LOW (ref >60)
GLUCOSE: 105 mg/dL — AB (ref 65–99)
Potassium: 3.4 mmol/L — ABNORMAL LOW (ref 3.5–5.1)
Sodium: 138 mmol/L (ref 135–145)
Total Bilirubin: 0.8 mg/dL (ref 0.3–1.2)
Total Protein: 7.4 g/dL (ref 6.5–8.1)

## 2014-09-25 LAB — CBC WITH DIFFERENTIAL/PLATELET
BASOS PCT: 1 %
Basophils Absolute: 0.1 10*3/uL (ref 0–0.1)
EOS PCT: 1 %
Eosinophils Absolute: 0.1 10*3/uL (ref 0–0.7)
HEMATOCRIT: 40.6 % (ref 35.0–47.0)
HEMOGLOBIN: 13.3 g/dL (ref 12.0–16.0)
Lymphocytes Relative: 43 %
Lymphs Abs: 3 10*3/uL (ref 1.0–3.6)
MCH: 28.1 pg (ref 26.0–34.0)
MCHC: 32.9 g/dL (ref 32.0–36.0)
MCV: 85.4 fL (ref 80.0–100.0)
MONO ABS: 0.3 10*3/uL (ref 0.2–0.9)
Monocytes Relative: 4 %
NEUTROS PCT: 51 %
Neutro Abs: 3.5 10*3/uL (ref 1.4–6.5)
PLATELETS: 284 10*3/uL (ref 150–440)
RBC: 4.75 MIL/uL (ref 3.80–5.20)
RDW: 14.6 % — AB (ref 11.5–14.5)
WBC: 6.9 10*3/uL (ref 3.6–11.0)

## 2014-09-25 MED ORDER — PACLITAXEL CHEMO INJECTION 300 MG/50ML
80.0000 mg/m2 | Freq: Once | INTRAVENOUS | Status: AC
  Administered 2014-09-25: 174 mg via INTRAVENOUS
  Filled 2014-09-25: qty 29

## 2014-09-25 MED ORDER — FAMOTIDINE IN NACL 20-0.9 MG/50ML-% IV SOLN
20.0000 mg | Freq: Once | INTRAVENOUS | Status: AC
Start: 2014-09-25 — End: 2014-09-25
  Administered 2014-09-25: 20 mg via INTRAVENOUS
  Filled 2014-09-25: qty 50

## 2014-09-25 MED ORDER — SODIUM CHLORIDE 0.9 % IV SOLN
Freq: Once | INTRAVENOUS | Status: AC
  Administered 2014-09-25: 16:00:00 via INTRAVENOUS
  Filled 2014-09-25: qty 1000

## 2014-09-25 MED ORDER — HEPARIN SOD (PORK) LOCK FLUSH 100 UNIT/ML IV SOLN
500.0000 [IU] | Freq: Once | INTRAVENOUS | Status: AC | PRN
  Administered 2014-09-25: 500 [IU]

## 2014-09-25 MED ORDER — SODIUM CHLORIDE 0.9 % IV SOLN
Freq: Once | INTRAVENOUS | Status: AC
  Administered 2014-09-25: 16:00:00 via INTRAVENOUS
  Filled 2014-09-25: qty 4

## 2014-09-25 MED ORDER — SODIUM CHLORIDE 0.9 % IJ SOLN
10.0000 mL | INTRAMUSCULAR | Status: AC | PRN
  Administered 2014-09-25: 10 mL via INTRAVENOUS
  Filled 2014-09-25: qty 10

## 2014-09-25 MED ORDER — SODIUM CHLORIDE 0.9 % IJ SOLN
10.0000 mL | INTRAMUSCULAR | Status: DC | PRN
  Filled 2014-09-25: qty 10

## 2014-09-25 MED ORDER — HEPARIN SOD (PORK) LOCK FLUSH 100 UNIT/ML IV SOLN
500.0000 [IU] | Freq: Once | INTRAVENOUS | Status: AC
  Administered 2014-09-25: 500 [IU] via INTRAVENOUS
  Filled 2014-09-25: qty 5

## 2014-09-25 MED ORDER — DIPHENHYDRAMINE HCL 25 MG PO CAPS: 50.0000 mg | ORAL_CAPSULE | Freq: Once | ORAL | Status: DC

## 2014-09-25 MED ORDER — DIPHENHYDRAMINE HCL 50 MG/ML IJ SOLN
12.5000 mg | Freq: Once | INTRAMUSCULAR | Status: AC
  Administered 2014-09-25: 12.5 mg via INTRAVENOUS
  Filled 2014-09-25: qty 1

## 2014-09-25 MED ORDER — TRASTUZUMAB CHEMO INJECTION 440 MG: 6.0000 mg/kg | Freq: Once | INTRAVENOUS | Status: DC

## 2014-09-25 MED ORDER — ACETAMINOPHEN 325 MG PO TABS
650.0000 mg | ORAL_TABLET | Freq: Once | ORAL | Status: DC
  Filled 2014-09-25: qty 2

## 2014-09-25 NOTE — Progress Notes (Signed)
Does not have living will on file. Patient was a former smoker.  Patient states she has been experiencing abdominal pain, discomfort and diarrhea. States she has diarrhea 3-4 times a day.

## 2014-09-29 ENCOUNTER — Encounter
Admission: RE | Admit: 2014-09-29 | Discharge: 2014-09-29 | Disposition: A | Discharge status: Home / Self Care | Payer: BLUE CROSS/BLUE SHIELD | Source: Ambulatory Visit | Attending: Oncology | Admitting: Oncology

## 2014-09-29 DIAGNOSIS — C50912 Malignant neoplasm of unspecified site of left female breast: Secondary | ICD-10-CM | POA: Diagnosis present

## 2014-09-29 MED ORDER — TECHNETIUM TC 99M-LABELED RED BLOOD CELLS IV KIT
22.5630 | PACK | Freq: Once | INTRAVENOUS | Status: AC | PRN
  Administered 2014-09-29: 22.563 via INTRAVENOUS

## 2014-10-01 ENCOUNTER — Encounter: Payer: Self-pay | Admitting: Oncology

## 2014-10-01 NOTE — Progress Notes (Signed)
[place] @ [facility] Telephone:(336) [phone]  Fax:(336) [place] OB: 1951-03-04  MR#: [id number]  UXN#:[id number]  Patient Care Team: Jerrol Banana., MD as PCP - General (Family Medicine) Robert Bellow, MD as Consulting Physician (General Surgery) Nicholaus Bloom, MD as Consulting Physician (Plastic Surgery) Tammy Rondell Reams, [place] (Family Medicine)  CHIEF COMPLAINT:  Chief Complaint  Patient presents with  . Follow-up    Breast Cancer  . Chemotherapy    Oncology History   1.  Abnormal mammogram in April of 2016 followed by ultrasound and based on clinical examination 1 cm tumor.  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  (July 05, 2014) 2.  Status post lumpectomy and sentinel lymph node evaluation tumor size is 1.4 cm sentinel lymph nodes negative.  Stage IC T1 cN0 M0 tumor 3.  Patient is going for breast reduction surgery in June of 2016 4.  Adjuvant chemotherapy started with Taxol and Herceptin on September 04, 2014     Carcinoma of left breast   07/05/2014 Initial Diagnosis Carcinoma of left breast    Oncology Flowsheet 09/04/2014 09/04/2014 09/12/2014 09/18/2014 09/25/2014  Day, Cycle Day 1, 1   Day 8, 1 Day 15, 1 Day 1, Cycle 2  dexamethasone (DECADRON) IV [ 10 mg ] 8 mg [ 10 mg ] [ 10 mg ] [ 10 mg ]  ondansetron (ZOFRAN) IV [ 8 mg ]   [ 8 mg ] [ 8 mg ] [ 8 mg ]  PACLitaxel (TAXOL) IV 80 mg/m2   80 mg/m2 80 mg/m2 80 mg/m2  trastuzumab (HERCEPTIN) IV 8 mg/kg   - - -    INTERVAL HISTORY: 64 year old lady underwent lumpectomy and sentinel lymph node biopsy.  Patient has stage IC disease.  Patient has been on to have reduction breast reduction surgery tomorrow. Patient and family here to discuss the results and the further planning of treatment She  underwent mammoplasty.  Wound is healing well.  Patient is here for ongoing evaluation and consideration of  adjuvant treatment.  Has difficulty in sleeping somewhat depressed at number of questions. 64 11, 2016 Patient complains of diarrhea.  Lasting for a 64 days.  Did not take any Imodium.  Patient continues to be very depressed Chills.  No fever.  Her nausea.  Here for continuation of second cycle of chemotherapy September 25, 2014 Patient is here for continuation of chemotherapy complaints of muscle aches.  Somewhat depressed.  Not able to sleep in the night.  REVIEW OF SYSTEMS:   GENERAL:  Feels good.  Active.  No fevers, sweats or weight loss.  PERFORMANCE STATUS (ECOG):  0 HEENT:  No visual changes, runny nose, sore throat, mouth sores or tenderness. Lungs: Dry hacking cough with expectoration Cardiac:  No chest pain, palpitations, orthopnea, or PND. GI:  No nausea, vomiting, complains of diarrhea this started 2 days after chemotherapy lasting for 2 days.  Still has 1 or 2 loose stool.  Did not take any Imodium.  No abdominal pain. GU:  No urgency, frequency, dysuria, or hematuria. Musculoskeletal: Muscle aches and joint pains Extremities:  No pain or swelling. Skin:  No rashes or skin changes. Neuro:  No headache, numbness or weakness, balance or coordination issues. Endocrine:  No diabetes, thyroid issues, hot flashes or night sweats. Psych:  No mood changes, depression or anxiety. Pain:  No focal pain. Review of systems:  All  other systems reviewed and found to be negative. As per HPI. Otherwise, a complete review of systems is negatve.  PAST MEDICAL HISTORY: Past Medical History  Diagnosis Date  . Hypertension   . Hyperlipidemia   . GERD (gastroesophageal reflux disease)   . Depression 1995  . Cancer     skin   . 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 Complication of anesthesia 2004    oversensitive to noise, lights    PAST SURGICAL HISTORY: Past Surgical History  Procedure Laterality Date  . Back surgery    .  Tonsillectomy    . Skin cancer  removal  2015    nose   . Colonoscopy  2013    Dr. Vira Agar  . Breast lumpectomy with axillary lymph node dissection Left 08/03/2014    Procedure: BREAST LUMPECTOMY WITH AXILLARY LYMPH NODE DISSECTION;  Surgeon: Robert Bellow, MD;  Location: [facility] ORS;  Service: General;  Laterality: Left;  . Sentinel node biopsy Left 08/03/2014    Procedure: SENTINEL NODE BIOPSY;  Surgeon: Robert Bellow, MD;  Location: [facility] ORS;  Service: General;  Laterality: Left;  . Axillary lymph node biopsy Left 08/03/2014    Procedure: AXILLARY LYMPH NODE BIOPSY;  Surgeon: Robert Bellow, MD;  Location: [facility] ORS;  Service: General;  Laterality: Left;  . Breast surgery Left Aug 03, 2014    Wide excision, sentinel node biopsy.  . Portacath placement Right 08/31/2014    Procedure: INSERTION PORT-A-CATH;  Surgeon: Robert Bellow, MD;  Location: [facility] ORS;  Service: General;  Laterality: Right;    FAMILY HISTORY Family History  Problem Relation Age of Onset  . Breast cancer Mother 42       . Breast cancer Cousin 62         ADVANCED DIRECTIVES:  Patient does not have any living will or healthcare power of attorney.  Information was given .  Available resources had been discussed.  We will follow-up on subsequent appointments regarding this issue  HEALTH MAINTENANCE: History  Substance Use Topics  . Smoking status: Former Smoker -- 1.00 packs/day for 2 years    Types: Cigarettes    Quit date: 07/30/1976  . Smokeless tobacco: Not on file  . Alcohol Use: 0.0 - 1.2 oz/week    0-1 Glasses of wine, 0-1 Shots of liquor, 0 Standard drinks or equivalent per week      Allergies  Allergen Reactions  . Sulfa Antibiotics Hives and Rash    Current Outpatient Prescriptions  Medication Sig Dispense Refill  . chlorpheniramine-HYDROcodone (TUSSIONEX) 10-8 MG/5ML SUER Take 5 mLs by mouth every 12 (twelve) hours as needed for cough. 140 mL 0  . docusate sodium (COLACE) 100 MG  capsule Take 100 mg by mouth daily as needed for mild constipation.    Marland Kitchen HYDROcodone-acetaminophen (NORCO) 5-325 MG per tablet Take 1-2 tablets by mouth every 4 (four) hours as needed for moderate pain or severe pain. 30 tablet 0  . KLOR-CON M20 20 MEQ tablet Take 20 mEq by mouth every morning.   4  . lidocaine-prilocaine (EMLA) cream Apply 1 application topically as needed. 30 g 3  . omeprazole (PRILOSEC) 40 MG capsule Take 40 mg by mouth every morning.   4  . ondansetron (ZOFRAN) 8 MG tablet Take 1 tablet (8 mg total) by mouth 2 (two) times daily. Start the day after chemo for 2 days. Then take as needed for  nausea or vomiting. 30 tablet 1  . pravastatin (PRAVACHOL) 40 MG tablet Take 40 mg by mouth every morning.   3  . sertraline (ZOLOFT) 100 MG tablet Take 100 mg by mouth every morning.   4  . triamterene-hydrochlorothiazide (MAXZIDE) 75-50 MG per tablet Take 1 tablet by mouth every morning.   4  . azithromycin (ZITHROMAX) 500 MG tablet Take 1 tablet (500 mg total) by mouth daily. (Patient not taking: Reported on 09/18/2014) 3 tablet 0  . cephALEXin (KEFLEX) 500 MG capsule Take 500 mg by mouth 2 (two) times daily.    . cetirizine (ZYRTEC) 10 MG tablet Take 10 mg by mouth every morning. As needed for seasonal allergies.  3   No current facility-administered medications for this visit.   Facility-Administered Medications Ordered in Other Visits  Medication Dose Route Frequency Provider Last Rate Last Dose  . acetaminophen (TYLENOL) tablet 650 mg  650 mg Oral Once Forest Gleason, MD      . diphenhydrAMINE (BENADRYL) capsule 50 mg  50 mg Oral Once Forest Gleason, MD      . sodium chloride 0.9 % injection 10 mL  10 mL Intravenous PRN Forest Gleason, MD   10 mL at 09/25/14 1418  . sodium chloride 0.9 % injection 10 mL  10 mL Intracatheter PRN Forest Gleason, MD        OBJECTIVE:  Filed Vitals:   09/25/14 1423  BP: 135/82  Pulse: 76  Temp: 96.6 F (35.9 C)  Resp: 20     Body mass index is 37.05  kg/(m^2).    ECOG FS:0 - Asymptomatic  PHYSICAL EXAM: General  status: Performance status is good.  Patient has not lost significant weight HEENT: No evidence of stomatitis. Sclera and conjunctivae :: No jaundice.   pale looking. Lungs: Air  entry equal on both sides.  No rhonchi.  No rales.  Cardiac: Heart sounds are normal.  No pericardial rub.  No murmur. Lymphatic system: Cervical, axillary, inguinal, lymph nodes not palpable GI: Abdomen is soft.  No ascites.  Liver spleen not palpable.  No tenderness.  Bowel sounds are within normal limit Lower extremity: No edema Neurological system: Higher functions, cranial nerves intact no evidence of peripheral neuropathy. Skin: No rash.  No ecchymosis.. Bilateral breast mammoplasty wound is healing well. Port site within normal limit healing well.    LAB RESULTS:  Infusion on 09/25/2014  Component Date Value Ref Range Status  . WBC 09/25/2014 6.9  3.6 - 11.0 K/uL Final  . RBC 09/25/2014 4.75  3.80 - 5.20 MIL/uL Final  . Hemoglobin 09/25/2014 13.3  12.0 - 16.0 g/dL Final  . HCT 09/25/2014 40.6  35.0 - 47.0 % Final  . MCV 09/25/2014 85.4  80.0 - 100.0 fL Final  . MCH 09/25/2014 28.1  26.0 - 34.0 pg Final  . MCHC 09/25/2014 32.9  32.0 - 36.0 g/dL Final  . RDW 09/25/2014 14.6* 11.5 - 14.5 % Final  . Platelets 09/25/2014 284  150 - 440 K/uL Final  . Neutrophils Relative % 09/25/2014 51   Final  . Neutro Abs 09/25/2014 3.5  1.4 - 6.5 K/uL Final  . Lymphocytes Relative 09/25/2014 43   Final  . Lymphs Abs 09/25/2014 3.0  1.0 - 3.6 K/uL Final  . Monocytes Relative 09/25/2014 4   Final  . Monocytes Absolute 09/25/2014 0.3  0.2 - 0.9 K/uL Final  . Eosinophils Relative 09/25/2014 1   Final  . Eosinophils Absolute 09/25/2014 0.1  0 - 0.7 K/uL  Final  . Basophils Relative 09/25/2014 1   Final  . Basophils Absolute 09/25/2014 0.1  0 - 0.1 K/uL Final  . Sodium 09/25/2014 138  135 - 145 mmol/L Final  . Potassium 09/25/2014 3.4* 3.5 - 5.1 mmol/L Final   . Chloride 09/25/2014 103  101 - 111 mmol/L Final  . CO2 09/25/2014 28  22 - 32 mmol/L Final  . Glucose, Bld 09/25/2014 105* 65 - 99 mg/dL Final  . BUN 09/25/2014 18  6 - 20 mg/dL Final  . Creatinine, Ser 09/25/2014 1.27* 0.44 - 1.00 mg/dL Final  . Calcium 09/25/2014 9.1  8.9 - 10.3 mg/dL Final  . Total Protein 09/25/2014 7.4  6.5 - 8.1 g/dL Final  . Albumin 09/25/2014 4.2  3.5 - 5.0 g/dL Final  . AST 09/25/2014 28  15 - 41 U/L Final  . ALT 09/25/2014 23  14 - 54 U/L Final  . Alkaline Phosphatase 09/25/2014 48  38 - 126 U/L Final  . Total Bilirubin 09/25/2014 0.8  0.3 - 1.2 mg/dL Final  . GFR calc non Af Amer 09/25/2014 44* >60 mL/min Final  . GFR calc Af Amer 09/25/2014 51* >60 mL/min Final   Comment: (NOTE) The eGFR has been calculated using the CKD EPI equation. This calculation has not been validated in all clinical situations. eGFR's persistently <60 mL/min signify possible Chronic Kidney Disease.   . Anion gap 09/25/2014 7  5 - 15 Final      STUDIES: Nm Cardiac Muga Rest  09/29/2014   CLINICAL DATA:  Breast cancer.  High risk chemotherapy.  EXAM: NUCLEAR MEDICINE CARDIAC BLOOD POOL IMAGING (MUGA)  TECHNIQUE: Cardiac multi-gated acquisition was performed at rest following intravenous injection of Tc-47mlabeled red blood cells.  RADIOPHARMACEUTICALS:  22.56 mCi Tc-973mDP in-vitro labeled red blood cells IV  COMPARISON:  None.  FINDINGS: The left ventricular ejection fraction equals 62%. Normal left ventricular wall motion.  IMPRESSION: 1. Left ventricular ejection fraction equals 62%   Electronically Signed   By: TaKerby Moors.D.   On: 09/29/2014 09:46    ASSESSMENT: Carcinoma of breast status post lumpectomy and sentinel lymph node (left breast) stage IC Estrogen and progesterone receptor negative HER-2/neu positive tumor Status post mammoplasty on both breast Lab data has been reviewed. Therapy with Taxol and Herceptin will be continued  MEDICAL DECISION MAKING:    All lab data has been reviewed Diarrhea has resolved but patient has no muscle aches   Patient expressed understanding and was in agreement with this plan. She also understands that She can call clinic at any time with any questions, concerns, or complaints.    Carcinoma of left breast   Staging form: Breast, AJCC 7th Edition     Clinical: Stage IA (T1c, N0, M0) - UnMarni GriffonMD   10/01/2014 6:55 AM

## 2014-10-02 ENCOUNTER — Other Ambulatory Visit: Payer: BLUE CROSS/BLUE SHIELD

## 2014-10-02 ENCOUNTER — Ambulatory Visit: Payer: BLUE CROSS/BLUE SHIELD

## 2014-10-02 ENCOUNTER — Inpatient Hospital Stay: Payer: BLUE CROSS/BLUE SHIELD

## 2014-10-02 VITALS — BP 121/71 | HR 80 | Resp 18

## 2014-10-02 DIAGNOSIS — N6320 Unspecified lump in the left breast, unspecified quadrant: Secondary | ICD-10-CM

## 2014-10-02 DIAGNOSIS — C50912 Malignant neoplasm of unspecified site of left female breast: Secondary | ICD-10-CM

## 2014-10-02 DIAGNOSIS — F32A Depression, unspecified: Secondary | ICD-10-CM

## 2014-10-02 DIAGNOSIS — C801 Malignant (primary) neoplasm, unspecified: Secondary | ICD-10-CM

## 2014-10-02 DIAGNOSIS — E876 Hypokalemia: Secondary | ICD-10-CM

## 2014-10-02 DIAGNOSIS — F329 Major depressive disorder, single episode, unspecified: Secondary | ICD-10-CM

## 2014-10-02 LAB — CBC WITH DIFFERENTIAL/PLATELET
BASOS PCT: 1 %
Basophils Absolute: 0.1 10*3/uL (ref 0–0.1)
Eosinophils Absolute: 0 10*3/uL (ref 0–0.7)
Eosinophils Relative: 1 %
HCT: 37.9 % (ref 35.0–47.0)
Hemoglobin: 12.6 g/dL (ref 12.0–16.0)
LYMPHS ABS: 2.2 10*3/uL (ref 1.0–3.6)
LYMPHS PCT: 44 %
MCH: 28.3 pg (ref 26.0–34.0)
MCHC: 33.2 g/dL (ref 32.0–36.0)
MCV: 85.2 fL (ref 80.0–100.0)
MONOS PCT: 4 %
Monocytes Absolute: 0.2 10*3/uL (ref 0.2–0.9)
NEUTROS PCT: 50 %
Neutro Abs: 2.4 10*3/uL (ref 1.4–6.5)
Platelets: 212 10*3/uL (ref 150–440)
RBC: 4.45 MIL/uL (ref 3.80–5.20)
RDW: 15.1 % — ABNORMAL HIGH (ref 11.5–14.5)
WBC: 4.9 10*3/uL (ref 3.6–11.0)

## 2014-10-02 MED ORDER — HEPARIN SOD (PORK) LOCK FLUSH 100 UNIT/ML IV SOLN
500.0000 [IU] | Freq: Once | INTRAVENOUS | Status: AC | PRN
  Administered 2014-10-02: 500 [IU]
  Filled 2014-10-02: qty 5

## 2014-10-02 MED ORDER — DIPHENHYDRAMINE HCL 25 MG PO CAPS: 50.0000 mg | ORAL_CAPSULE | Freq: Once | ORAL | Status: DC

## 2014-10-02 MED ORDER — SODIUM CHLORIDE 0.9 % IV SOLN
6.0000 mg/kg | Freq: Once | INTRAVENOUS | Status: AC
  Administered 2014-10-02: 588 mg via INTRAVENOUS
  Filled 2014-10-02: qty 28

## 2014-10-02 MED ORDER — DEXAMETHASONE SODIUM PHOSPHATE 100 MG/10ML IJ SOLN
Freq: Once | INTRAMUSCULAR | Status: AC
  Administered 2014-10-02: 11:00:00 via INTRAVENOUS
  Filled 2014-10-02: qty 4

## 2014-10-02 MED ORDER — SODIUM CHLORIDE 0.9 % IV SOLN
Freq: Once | INTRAVENOUS | Status: AC
  Administered 2014-10-02: 10:00:00 via INTRAVENOUS
  Filled 2014-10-02: qty 1000

## 2014-10-02 MED ORDER — FAMOTIDINE IN NACL 20-0.9 MG/50ML-% IV SOLN
20.0000 mg | Freq: Once | INTRAVENOUS | Status: AC
  Administered 2014-10-02: 20 mg via INTRAVENOUS
  Filled 2014-10-02: qty 50

## 2014-10-02 MED ORDER — PACLITAXEL CHEMO INJECTION 300 MG/50ML
80.0000 mg/m2 | Freq: Once | INTRAVENOUS | Status: AC
  Administered 2014-10-02: 174 mg via INTRAVENOUS
  Filled 2014-10-02: qty 29

## 2014-10-02 MED ORDER — ACETAMINOPHEN 325 MG PO TABS
650.0000 mg | ORAL_TABLET | Freq: Once | ORAL | Status: AC
  Administered 2014-10-02: 650 mg via ORAL
  Filled 2014-10-02: qty 2

## 2014-10-02 MED ORDER — SODIUM CHLORIDE 0.9 % IJ SOLN
10.0000 mL | INTRAMUSCULAR | Status: DC | PRN
  Administered 2014-10-02: 10 mL
  Filled 2014-10-02: qty 10

## 2014-10-02 MED ORDER — DIPHENHYDRAMINE HCL 50 MG/ML IJ SOLN
12.5000 mg | Freq: Once | INTRAMUSCULAR | Status: AC
  Administered 2014-10-02: 12.5 mg via INTRAVENOUS
  Filled 2014-10-02: qty 1

## 2014-10-09 ENCOUNTER — Ambulatory Visit: Payer: BLUE CROSS/BLUE SHIELD | Admitting: Oncology

## 2014-10-09 ENCOUNTER — Encounter: Payer: Self-pay | Admitting: Oncology

## 2014-10-09 ENCOUNTER — Inpatient Hospital Stay (HOSPITAL_BASED_OUTPATIENT_CLINIC_OR_DEPARTMENT_OTHER): Payer: BLUE CROSS/BLUE SHIELD | Admitting: Oncology

## 2014-10-09 ENCOUNTER — Inpatient Hospital Stay: Payer: BLUE CROSS/BLUE SHIELD | Attending: Oncology

## 2014-10-09 ENCOUNTER — Ambulatory Visit: Payer: BLUE CROSS/BLUE SHIELD

## 2014-10-09 ENCOUNTER — Inpatient Hospital Stay: Payer: BLUE CROSS/BLUE SHIELD

## 2014-10-09 ENCOUNTER — Other Ambulatory Visit: Payer: BLUE CROSS/BLUE SHIELD

## 2014-10-09 VITALS — BP 145/82 | HR 99 | Temp 96.2°F | Resp 18

## 2014-10-09 VITALS — BP 147/79 | HR 101 | Temp 96.7°F | Wt 223.5 lb

## 2014-10-09 DIAGNOSIS — C50912 Malignant neoplasm of unspecified site of left female breast: Secondary | ICD-10-CM | POA: Insufficient documentation

## 2014-10-09 DIAGNOSIS — G4700 Insomnia, unspecified: Secondary | ICD-10-CM | POA: Insufficient documentation

## 2014-10-09 DIAGNOSIS — Z5111 Encounter for antineoplastic chemotherapy: Secondary | ICD-10-CM | POA: Insufficient documentation

## 2014-10-09 DIAGNOSIS — Z171 Estrogen receptor negative status [ER-]: Secondary | ICD-10-CM | POA: Insufficient documentation

## 2014-10-09 DIAGNOSIS — E785 Hyperlipidemia, unspecified: Secondary | ICD-10-CM | POA: Insufficient documentation

## 2014-10-09 DIAGNOSIS — F329 Major depressive disorder, single episode, unspecified: Secondary | ICD-10-CM

## 2014-10-09 DIAGNOSIS — Z87891 Personal history of nicotine dependence: Secondary | ICD-10-CM | POA: Insufficient documentation

## 2014-10-09 DIAGNOSIS — C801 Malignant (primary) neoplasm, unspecified: Secondary | ICD-10-CM

## 2014-10-09 DIAGNOSIS — I10 Essential (primary) hypertension: Secondary | ICD-10-CM | POA: Insufficient documentation

## 2014-10-09 DIAGNOSIS — Z85820 Personal history of malignant melanoma of skin: Secondary | ICD-10-CM | POA: Insufficient documentation

## 2014-10-09 DIAGNOSIS — K219 Gastro-esophageal reflux disease without esophagitis: Secondary | ICD-10-CM

## 2014-10-09 DIAGNOSIS — Z79899 Other long term (current) drug therapy: Secondary | ICD-10-CM

## 2014-10-09 DIAGNOSIS — Z803 Family history of malignant neoplasm of breast: Secondary | ICD-10-CM

## 2014-10-09 LAB — CBC WITH DIFFERENTIAL/PLATELET
Basophils Absolute: 0.1 10*3/uL (ref 0–0.1)
Basophils Relative: 1 %
EOS ABS: 0.1 10*3/uL (ref 0–0.7)
EOS PCT: 1 %
HCT: 39.2 % (ref 35.0–47.0)
HEMOGLOBIN: 13 g/dL (ref 12.0–16.0)
LYMPHS ABS: 2.7 10*3/uL (ref 1.0–3.6)
Lymphocytes Relative: 44 %
MCH: 28.3 pg (ref 26.0–34.0)
MCHC: 33.1 g/dL (ref 32.0–36.0)
MCV: 85.5 fL (ref 80.0–100.0)
Monocytes Absolute: 0.3 10*3/uL (ref 0.2–0.9)
Monocytes Relative: 5 %
Neutro Abs: 3 10*3/uL (ref 1.4–6.5)
Neutrophils Relative %: 49 %
Platelets: 247 10*3/uL (ref 150–440)
RBC: 4.58 MIL/uL (ref 3.80–5.20)
RDW: 15.9 % — ABNORMAL HIGH (ref 11.5–14.5)
WBC: 6.1 10*3/uL (ref 3.6–11.0)

## 2014-10-09 LAB — COMPREHENSIVE METABOLIC PANEL
ALBUMIN: 4.1 g/dL (ref 3.5–5.0)
ALT: 28 U/L (ref 14–54)
ANION GAP: 9 (ref 5–15)
AST: 34 U/L (ref 15–41)
Alkaline Phosphatase: 46 U/L (ref 38–126)
BUN: 21 mg/dL — ABNORMAL HIGH (ref 6–20)
CHLORIDE: 101 mmol/L (ref 101–111)
CO2: 26 mmol/L (ref 22–32)
Calcium: 8.6 mg/dL — ABNORMAL LOW (ref 8.9–10.3)
Creatinine, Ser: 1.14 mg/dL — ABNORMAL HIGH (ref 0.44–1.00)
GFR calc Af Amer: 58 mL/min — ABNORMAL LOW (ref >60)
GFR calc non Af Amer: 50 mL/min — ABNORMAL LOW (ref >60)
Glucose, Bld: 155 mg/dL — ABNORMAL HIGH (ref 65–99)
Potassium: 3 mmol/L — ABNORMAL LOW (ref 3.5–5.1)
SODIUM: 136 mmol/L (ref 135–145)
Total Bilirubin: 1 mg/dL (ref 0.3–1.2)
Total Protein: 7.3 g/dL (ref 6.5–8.1)

## 2014-10-09 MED ORDER — DIPHENHYDRAMINE HCL 50 MG/ML IJ SOLN
12.5000 mg | Freq: Once | INTRAMUSCULAR | Status: AC
  Administered 2014-10-09: 12.5 mg via INTRAVENOUS
  Filled 2014-10-09: qty 1

## 2014-10-09 MED ORDER — SODIUM CHLORIDE 0.9 % IV SOLN
Freq: Once | INTRAVENOUS | Status: AC
  Administered 2014-10-09: 11:00:00 via INTRAVENOUS
  Filled 2014-10-09: qty 4

## 2014-10-09 MED ORDER — HEPARIN SOD (PORK) LOCK FLUSH 100 UNIT/ML IV SOLN
500.0000 [IU] | Freq: Once | INTRAVENOUS | Status: AC | PRN
  Administered 2014-10-09: 500 [IU]
  Filled 2014-10-09 (×2): qty 5

## 2014-10-09 MED ORDER — FAMOTIDINE IN NACL 20-0.9 MG/50ML-% IV SOLN
20.0000 mg | Freq: Once | INTRAVENOUS | Status: AC
  Administered 2014-10-09: 20 mg via INTRAVENOUS
  Filled 2014-10-09: qty 50

## 2014-10-09 MED ORDER — PACLITAXEL CHEMO INJECTION 300 MG/50ML
80.0000 mg/m2 | Freq: Once | INTRAVENOUS | Status: AC
  Administered 2014-10-09: 174 mg via INTRAVENOUS
  Filled 2014-10-09: qty 29

## 2014-10-09 MED ORDER — SODIUM CHLORIDE 0.9 % IV SOLN
Freq: Once | INTRAVENOUS | Status: AC
  Administered 2014-10-09: 11:00:00 via INTRAVENOUS
  Filled 2014-10-09: qty 1000

## 2014-10-09 NOTE — Progress Notes (Signed)
[place] @ [facility] Telephone:(336) [phone]  Fax:(336) [place] OB: 31-May-1950  MR#: [id number]  GQQ#:[id number]  Patient Care Team: Jerrol Banana., MD as PCP - General (Family Medicine) Robert Bellow, MD as Consulting Physician (General Surgery) Nicholaus Bloom, MD as Consulting Physician (Plastic Surgery) Tammy Rondell Reams, [place] (Family Medicine)  CHIEF COMPLAINT:  Chief Complaint  Patient presents with  . Follow-up    Oncology History   1.  Abnormal mammogram in April of 2016 followed by ultrasound and based on clinical examination 1 cm tumor.  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  (July 05, 2014) 2.  Status post lumpectomy and sentinel lymph node evaluation tumor size is 1.4 cm sentinel lymph nodes negative.  Stage IC T1 cN0 M0 tumor 3.  Patient is going for breast reduction surgery in June of 2016 4.  Adjuvant chemotherapy started with Taxol and Herceptin on September 04, 2014     Carcinoma of left breast   07/05/2014 Initial Diagnosis Carcinoma of left breast    Oncology Flowsheet 09/04/2014 09/04/2014 09/12/2014 09/18/2014 09/25/2014 10/02/2014  Day, Cycle Day 1, 1   Day 8, 1 Day 15, 1 Day 1, Cycle 2 Day 8, Cycle 2  dexamethasone (DECADRON) IV [ 10 mg ] 8 mg [ 10 mg ] [ 10 mg ] [ 10 mg ] [ 10 mg ]  ondansetron (ZOFRAN) IV [ 8 mg ]   [ 8 mg ] [ 8 mg ] [ 8 mg ] [ 8 mg ]  PACLitaxel (TAXOL) IV 80 mg/m2   80 mg/m2 80 mg/m2 80 mg/m2 80 mg/m2  trastuzumab (HERCEPTIN) IV 8 mg/kg   - - - 6 mg/kg    INTERVAL HISTORY: 64 year old lady here for continuation of chemotherapy with Taxol and Herceptin 1.4 cm tumor which is HER-2 positive.  Estrogen and progesterone receptor negative.  REVIEW OF SYSTEMS:   GENERAL:  Feels good.  Active.  No fevers, sweats or weight loss.  PERFORMANCE STATUS (ECOG):  0 HEENT:  No visual changes, runny nose, sore throat, mouth sores  or tenderness. Lungs: Dry hacking cough with expectoration Cardiac:  No chest pain, palpitations, orthopnea, or PND. GI:  No nausea, vomiting, complains of diarrhea this started 2 days after chemotherapy lasting for 2 days.  Still has 1 or 2 loose stool.  Did not take any Imodium.  Lower abdominal discomfort GU:  No urgency, frequency, dysuria, or hematuria. Musculoskeletal: Muscle aches and joint pains Extremities:  No pain or swelling. Skin:  Patient complains of rash that appears after chemotherapy and gradually disappears. Neuro:  No headache, numbness or weakness, balance or coordination issues. Endocrine:  No diabetes, thyroid issues, hot flashes or night sweats. Psych:  No mood changes, depression or anxiety. Pain:  No focal pain. Review of systems:  All other systems reviewed and found to be negative. As per HPI. Otherwise, a complete review of systems is negatve.  PAST MEDICAL HISTORY: Past Medical History  Diagnosis Date  . Hypertension   . Hyperlipidemia   . GERD (gastroesophageal reflux disease)   . Depression 1995  . Cancer     skin   . 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 Complication of anesthesia 2004    oversensitive to noise, lights    PAST SURGICAL HISTORY: Past Surgical History  Procedure  Laterality Date  . Back surgery    . Tonsillectomy    . Skin cancer  removal  2015    nose   . Colonoscopy  2013    Dr. Vira Agar  . Breast lumpectomy with axillary lymph node dissection Left 08/03/2014    Procedure: BREAST LUMPECTOMY WITH AXILLARY LYMPH NODE DISSECTION;  Surgeon: Robert Bellow, MD;  Location: [facility] ORS;  Service: General;  Laterality: Left;  . Sentinel node biopsy Left 08/03/2014    Procedure: SENTINEL NODE BIOPSY;  Surgeon: Robert Bellow, MD;  Location: [facility] ORS;  Service: General;  Laterality: Left;  . Axillary lymph node biopsy Left 08/03/2014    Procedure: AXILLARY LYMPH NODE BIOPSY;   Surgeon: Robert Bellow, MD;  Location: [facility] ORS;  Service: General;  Laterality: Left;  . Breast surgery Left Aug 03, 2014    Wide excision, sentinel node biopsy.  . Portacath placement Right 08/31/2014    Procedure: INSERTION PORT-A-CATH;  Surgeon: Robert Bellow, MD;  Location: [facility] ORS;  Service: General;  Laterality: Right;    FAMILY HISTORY Family History  Problem Relation Age of Onset  . Breast cancer Mother 28       . Breast cancer Cousin 62         ADVANCED DIRECTIVES:  Patient does not have any living will or healthcare power of attorney.  Information was given .  Available resources had been discussed.  We will follow-up on subsequent appointments regarding this issue  HEALTH MAINTENANCE: History  Substance Use Topics  . Smoking status: Former Smoker -- 1.00 packs/day for 2 years    Types: Cigarettes    Quit date: 07/30/1976  . Smokeless tobacco: Not on file  . Alcohol Use: 0.0 - 1.2 oz/week    0-1 Glasses of wine, 0-1 Shots of liquor, 0 Standard drinks or equivalent per week      Allergies  Allergen Reactions  . Sulfa Antibiotics Hives and Rash    Current Outpatient Prescriptions  Medication Sig Dispense Refill  . cetirizine (ZYRTEC) 10 MG tablet Take 10 mg by mouth every morning. As needed for seasonal allergies.  3  . chlorpheniramine-HYDROcodone (TUSSIONEX) 10-8 MG/5ML SUER Take 5 mLs by mouth every 12 (twelve) hours as needed for cough. 140 mL 0  . docusate sodium (COLACE) 100 MG capsule Take 100 mg by mouth daily as needed for mild constipation.    Marland Kitchen HYDROcodone-acetaminophen (NORCO) 5-325 MG per tablet Take 1-2 tablets by mouth every 4 (four) hours as needed for moderate pain or severe pain. 30 tablet 0  . KLOR-CON M20 20 MEQ tablet Take 20 mEq by mouth every morning.   4  . lidocaine-prilocaine (EMLA) cream Apply 1 application topically as needed. 30 g 3  . omeprazole (PRILOSEC) 40 MG capsule Take 40 mg by mouth every morning.   4  . ondansetron  (ZOFRAN) 8 MG tablet Take 1 tablet (8 mg total) by mouth 2 (two) times daily. Start the day after chemo for 2 days. Then take as needed for nausea or vomiting. 30 tablet 1  . pravastatin (PRAVACHOL) 40 MG tablet Take 40 mg by mouth every morning.   3  . sertraline (ZOLOFT) 100 MG tablet Take 100 mg by mouth every morning.   4  . triamterene-hydrochlorothiazide (MAXZIDE) 75-50 MG per tablet Take 1 tablet by mouth every morning.   4  . azithromycin (ZITHROMAX) 500 MG tablet Take 1 tablet (500 mg total) by mouth daily. (Patient not taking: Reported on 10/09/2014)  3 tablet 0  . cephALEXin (KEFLEX) 500 MG capsule Take 500 mg by mouth 2 (two) times daily.     No current facility-administered medications for this visit.   Facility-Administered Medications Ordered in Other Visits  Medication Dose Route Frequency Provider Last Rate Last Dose  . acetaminophen (TYLENOL) tablet 650 mg  650 mg Oral Once Forest Gleason, MD      . diphenhydrAMINE (BENADRYL) capsule 50 mg  50 mg Oral Once Forest Gleason, MD      . sodium chloride 0.9 % injection 10 mL  10 mL Intravenous PRN Forest Gleason, MD   10 mL at 09/25/14 1418  . sodium chloride 0.9 % injection 10 mL  10 mL Intracatheter PRN Forest Gleason, MD      . sodium chloride 0.9 % injection 10 mL  10 mL Intracatheter PRN Forest Gleason, MD   10 mL at 10/02/14 0900    OBJECTIVE:  Filed Vitals:   10/09/14 0953  BP: 147/79  Pulse: 101  Temp: 96.7 F (35.9 C)     Body mass index is 37.2 kg/(m^2).    ECOG FS:0 - Asymptomatic  PHYSICAL EXAM: General  status: Performance status is good.  Patient has not lost significant weight HEENT: No evidence of stomatitis. Sclera and conjunctivae :: No jaundice.   pale looking. Lungs: Air  entry equal on both sides.  No rhonchi.  No rales.  Cardiac: Heart sounds are normal.  No pericardial rub.  No murmur. Lymphatic system: Cervical, axillary, inguinal, lymph nodes not palpable GI: Abdomen is soft.  No ascites.  Liver spleen not  palpable.  No tenderness.  Bowel sounds are within normal limit Lower extremity: No edema Neurological system: Higher functions, cranial nerves intact no evidence of peripheral neuropathy. Skin: No rash.  No ecchymosis.. Bilateral breast mammoplasty  Port site within normal limit healing well. Alopecia. No stomatitis   LAB RESULTS:  Infusion on 10/09/2014  Component Date Value Ref Range Status  . WBC 10/09/2014 6.1  3.6 - 11.0 K/uL Final   A-LINE DRAW  . RBC 10/09/2014 4.58  3.80 - 5.20 MIL/uL Final  . Hemoglobin 10/09/2014 13.0  12.0 - 16.0 g/dL Final  . HCT 10/09/2014 39.2  35.0 - 47.0 % Final  . MCV 10/09/2014 85.5  80.0 - 100.0 fL Final  . MCH 10/09/2014 28.3  26.0 - 34.0 pg Final  . MCHC 10/09/2014 33.1  32.0 - 36.0 g/dL Final  . RDW 10/09/2014 15.9* 11.5 - 14.5 % Final  . Platelets 10/09/2014 247  150 - 440 K/uL Final  . Neutrophils Relative % 10/09/2014 49   Final  . Neutro Abs 10/09/2014 3.0  1.4 - 6.5 K/uL Final  . Lymphocytes Relative 10/09/2014 44   Final  . Lymphs Abs 10/09/2014 2.7  1.0 - 3.6 K/uL Final  . Monocytes Relative 10/09/2014 5   Final  . Monocytes Absolute 10/09/2014 0.3  0.2 - 0.9 K/uL Final  . Eosinophils Relative 10/09/2014 1   Final  . Eosinophils Absolute 10/09/2014 0.1  0 - 0.7 K/uL Final  . Basophils Relative 10/09/2014 1   Final  . Basophils Absolute 10/09/2014 0.1  0 - 0.1 K/uL Final  . Sodium 10/09/2014 136  135 - 145 mmol/L Final  . Potassium 10/09/2014 3.0* 3.5 - 5.1 mmol/L Final  . Chloride 10/09/2014 101  101 - 111 mmol/L Final  . CO2 10/09/2014 26  22 - 32 mmol/L Final  . Glucose, Bld 10/09/2014 155* 65 - 99 mg/dL Final  .  BUN 10/09/2014 21* 6 - 20 mg/dL Final  . Creatinine, Ser 10/09/2014 1.14* 0.44 - 1.00 mg/dL Final  . Calcium 10/09/2014 8.6* 8.9 - 10.3 mg/dL Final  . Total Protein 10/09/2014 7.3  6.5 - 8.1 g/dL Final  . Albumin 10/09/2014 4.1  3.5 - 5.0 g/dL Final  . AST 10/09/2014 34  15 - 41 U/L Final  . ALT 10/09/2014 28  14 -  54 U/L Final  . Alkaline Phosphatase 10/09/2014 46  38 - 126 U/L Final  . Total Bilirubin 10/09/2014 1.0  0.3 - 1.2 mg/dL Final  . GFR calc non Af Amer 10/09/2014 50* >60 mL/min Final  . GFR calc Af Amer 10/09/2014 58* >60 mL/min Final   Comment: (NOTE) The eGFR has been calculated using the CKD EPI equation. This calculation has not been validated in all clinical situations. eGFR's persistently <60 mL/min signify possible Chronic Kidney Disease.   . Anion gap 10/09/2014 9  5 - 15 Final      STUDIES: Nm Cardiac Muga Rest  09/29/2014   CLINICAL DATA:  Breast cancer.  High risk chemotherapy.  EXAM: NUCLEAR MEDICINE CARDIAC BLOOD POOL IMAGING (MUGA)  TECHNIQUE: Cardiac multi-gated acquisition was performed at rest following intravenous injection of Tc-37mlabeled red blood cells.  RADIOPHARMACEUTICALS:  22.56 mCi Tc-954mDP in-vitro labeled red blood cells IV  COMPARISON:  None.  FINDINGS: The left ventricular ejection fraction equals 62%. Normal left ventricular wall motion.  IMPRESSION: 1. Left ventricular ejection fraction equals 62%   Electronically Signed   By: TaKerby Moors.D.   On: 09/29/2014 09:46    ASSESSMENT: Carcinoma of breast status post lumpectomy and sentinel lymph node (left breast) stage IC Estrogen and progesterone receptor negative HER-2/neu positive tumor Status post mammoplasty on both breast Lab data has been reviewed. Therapy with Taxol and Herceptin will be continued  MEDICAL DECISION MAKING:   All lab data has been reviewed Diarrhea has resolved but patient has no muscle aches   Patient expressed understanding and was in agreement with this plan. She also understands that She can call clinic at any time with any questions, concerns, or complaints.    Carcinoma of left breast   Staging form: Breast, AJCC 7th Edition     Clinical: Stage IA (T1c, N0, M0) - UnMarni GriffonMD   10/09/2014 10:34 AM

## 2014-10-09 NOTE — Progress Notes (Signed)
Patient does not have living will.  Information given previously.  Former smoker. 

## 2014-10-16 ENCOUNTER — Inpatient Hospital Stay: Payer: BLUE CROSS/BLUE SHIELD

## 2014-10-16 ENCOUNTER — Telehealth: Payer: Self-pay | Admitting: *Deleted

## 2014-10-16 VITALS — BP 133/75 | HR 96 | Temp 96.5°F

## 2014-10-16 DIAGNOSIS — C50912 Malignant neoplasm of unspecified site of left female breast: Secondary | ICD-10-CM

## 2014-10-16 DIAGNOSIS — C801 Malignant (primary) neoplasm, unspecified: Secondary | ICD-10-CM

## 2014-10-16 DIAGNOSIS — E876 Hypokalemia: Secondary | ICD-10-CM

## 2014-10-16 LAB — CBC WITH DIFFERENTIAL/PLATELET
Basophils Absolute: 0.1 10*3/uL (ref 0–0.1)
Basophils Relative: 2 %
EOS ABS: 0 10*3/uL (ref 0–0.7)
Eosinophils Relative: 1 %
HEMATOCRIT: 38.7 % (ref 35.0–47.0)
Hemoglobin: 13.3 g/dL (ref 12.0–16.0)
Lymphocytes Relative: 45 %
Lymphs Abs: 2.8 10*3/uL (ref 1.0–3.6)
MCH: 29.2 pg (ref 26.0–34.0)
MCHC: 34.3 g/dL (ref 32.0–36.0)
MCV: 85.1 fL (ref 80.0–100.0)
MONO ABS: 0.4 10*3/uL (ref 0.2–0.9)
MONOS PCT: 7 %
Neutro Abs: 2.8 10*3/uL (ref 1.4–6.5)
Neutrophils Relative %: 45 %
PLATELETS: 223 10*3/uL (ref 150–440)
RBC: 4.54 MIL/uL (ref 3.80–5.20)
RDW: 16.8 % — AB (ref 11.5–14.5)
WBC: 6.1 10*3/uL (ref 3.6–11.0)

## 2014-10-16 LAB — COMPREHENSIVE METABOLIC PANEL
ALK PHOS: 46 U/L (ref 38–126)
ALT: 30 U/L (ref 14–54)
ANION GAP: 8 (ref 5–15)
AST: 38 U/L (ref 15–41)
Albumin: 4.2 g/dL (ref 3.5–5.0)
BUN: 22 mg/dL — ABNORMAL HIGH (ref 6–20)
CHLORIDE: 100 mmol/L — AB (ref 101–111)
CO2: 27 mmol/L (ref 22–32)
Calcium: 8.9 mg/dL (ref 8.9–10.3)
Creatinine, Ser: 1.2 mg/dL — ABNORMAL HIGH (ref 0.44–1.00)
GFR calc non Af Amer: 47 mL/min — ABNORMAL LOW (ref >60)
GFR, EST AFRICAN AMERICAN: 55 mL/min — AB (ref >60)
Glucose, Bld: 113 mg/dL — ABNORMAL HIGH (ref 65–99)
Potassium: 2.9 mmol/L — CL (ref 3.5–5.1)
SODIUM: 135 mmol/L (ref 135–145)
Total Bilirubin: 1.1 mg/dL (ref 0.3–1.2)
Total Protein: 7.3 g/dL (ref 6.5–8.1)

## 2014-10-16 MED ORDER — DIPHENHYDRAMINE HCL 50 MG/ML IJ SOLN
12.5000 mg | Freq: Once | INTRAMUSCULAR | Status: AC
  Administered 2014-10-16: 12.5 mg via INTRAVENOUS
  Filled 2014-10-16: qty 1

## 2014-10-16 MED ORDER — SODIUM CHLORIDE 0.9 % IJ SOLN
10.0000 mL | INTRAMUSCULAR | Status: DC | PRN
Start: 2014-10-16 — End: 2014-10-16
  Filled 2014-10-16: qty 10

## 2014-10-16 MED ORDER — SODIUM CHLORIDE 0.9 % IV SOLN
Freq: Once | INTRAVENOUS | Status: AC
  Administered 2014-10-16: 11:00:00 via INTRAVENOUS
  Filled 2014-10-16: qty 4

## 2014-10-16 MED ORDER — SODIUM CHLORIDE 0.9 % IJ SOLN
10.0000 mL | INTRAMUSCULAR | Status: DC | PRN
  Administered 2014-10-16: 10 mL
  Filled 2014-10-16: qty 10

## 2014-10-16 MED ORDER — POTASSIUM CHLORIDE 20 MEQ/100ML IV SOLN
20.0000 meq | Freq: Once | INTRAVENOUS | Status: AC
  Administered 2014-10-16: 20 meq via INTRAVENOUS
  Filled 2014-10-16: qty 100

## 2014-10-16 MED ORDER — FAMOTIDINE IN NACL 20-0.9 MG/50ML-% IV SOLN
20.0000 mg | Freq: Once | INTRAVENOUS | Status: AC
  Administered 2014-10-16: 20 mg via INTRAVENOUS
  Filled 2014-10-16: qty 50

## 2014-10-16 MED ORDER — PACLITAXEL CHEMO INJECTION 300 MG/50ML
80.0000 mg/m2 | Freq: Once | INTRAVENOUS | Status: AC
  Administered 2014-10-16: 174 mg via INTRAVENOUS
  Filled 2014-10-16: qty 29

## 2014-10-16 MED ORDER — SODIUM CHLORIDE 0.9 % IV SOLN
Freq: Once | INTRAVENOUS | Status: AC
  Administered 2014-10-16: 10:00:00 via INTRAVENOUS
  Filled 2014-10-16: qty 1000

## 2014-10-16 MED ORDER — HEPARIN SOD (PORK) LOCK FLUSH 100 UNIT/ML IV SOLN
500.0000 [IU] | Freq: Once | INTRAVENOUS | Status: AC | PRN
  Administered 2014-10-16: 500 [IU]
  Filled 2014-10-16: qty 5

## 2014-10-16 NOTE — Telephone Encounter (Signed)
Critical K+ 2.9  MD notified. 

## 2014-10-16 NOTE — Progress Notes (Signed)
   10/16/14 1040  Clinical Encounter Type  Visited With Patient;Other (Comment) (Friend)  Visit Type Follow-up  Spiritual Encounters  Spiritual Needs Emotional  Visited with patient and her friend in the cancer center infusion suite.  Attempted to complete Advance Directive but no notary available.  Will follow up next week when patient returns.  [place] [phone]

## 2014-10-23 ENCOUNTER — Inpatient Hospital Stay: Payer: BLUE CROSS/BLUE SHIELD

## 2014-10-23 ENCOUNTER — Encounter: Payer: Self-pay | Admitting: Oncology

## 2014-10-23 ENCOUNTER — Inpatient Hospital Stay (HOSPITAL_BASED_OUTPATIENT_CLINIC_OR_DEPARTMENT_OTHER): Payer: BLUE CROSS/BLUE SHIELD | Admitting: Oncology

## 2014-10-23 VITALS — BP 145/77 | HR 90 | Temp 98.0°F | Wt 221.2 lb

## 2014-10-23 VITALS — BP 114/73 | HR 99 | Temp 97.7°F | Resp 18

## 2014-10-23 DIAGNOSIS — Z87891 Personal history of nicotine dependence: Secondary | ICD-10-CM

## 2014-10-23 DIAGNOSIS — F329 Major depressive disorder, single episode, unspecified: Secondary | ICD-10-CM

## 2014-10-23 DIAGNOSIS — C50912 Malignant neoplasm of unspecified site of left female breast: Secondary | ICD-10-CM

## 2014-10-23 DIAGNOSIS — Z85820 Personal history of malignant melanoma of skin: Secondary | ICD-10-CM

## 2014-10-23 DIAGNOSIS — G4700 Insomnia, unspecified: Secondary | ICD-10-CM | POA: Diagnosis not present

## 2014-10-23 DIAGNOSIS — I10 Essential (primary) hypertension: Secondary | ICD-10-CM

## 2014-10-23 DIAGNOSIS — E785 Hyperlipidemia, unspecified: Secondary | ICD-10-CM

## 2014-10-23 DIAGNOSIS — Z803 Family history of malignant neoplasm of breast: Secondary | ICD-10-CM

## 2014-10-23 DIAGNOSIS — Z171 Estrogen receptor negative status [ER-]: Secondary | ICD-10-CM

## 2014-10-23 DIAGNOSIS — Z79899 Other long term (current) drug therapy: Secondary | ICD-10-CM

## 2014-10-23 DIAGNOSIS — C801 Malignant (primary) neoplasm, unspecified: Secondary | ICD-10-CM

## 2014-10-23 DIAGNOSIS — K219 Gastro-esophageal reflux disease without esophagitis: Secondary | ICD-10-CM

## 2014-10-23 LAB — CBC WITH DIFFERENTIAL/PLATELET
Basophils Absolute: 0.1 10*3/uL (ref 0–0.1)
Basophils Relative: 1 %
EOS ABS: 0.1 10*3/uL (ref 0–0.7)
Eosinophils Relative: 1 %
HCT: 36.9 % (ref 35.0–47.0)
HEMOGLOBIN: 12.5 g/dL (ref 12.0–16.0)
LYMPHS ABS: 2.2 10*3/uL (ref 1.0–3.6)
Lymphocytes Relative: 39 %
MCH: 29.2 pg (ref 26.0–34.0)
MCHC: 33.9 g/dL (ref 32.0–36.0)
MCV: 86.2 fL (ref 80.0–100.0)
Monocytes Absolute: 0.3 10*3/uL (ref 0.2–0.9)
Monocytes Relative: 6 %
NEUTROS PCT: 53 %
Neutro Abs: 2.9 10*3/uL (ref 1.4–6.5)
Platelets: 209 10*3/uL (ref 150–440)
RBC: 4.29 MIL/uL (ref 3.80–5.20)
RDW: 17.6 % — ABNORMAL HIGH (ref 11.5–14.5)
WBC: 5.5 10*3/uL (ref 3.6–11.0)

## 2014-10-23 LAB — MAGNESIUM: MAGNESIUM: 1.7 mg/dL (ref 1.7–2.4)

## 2014-10-23 MED ORDER — HEPARIN SOD (PORK) LOCK FLUSH 100 UNIT/ML IV SOLN
500.0000 [IU] | Freq: Once | INTRAVENOUS | Status: AC
  Administered 2014-10-23: 500 [IU] via INTRAVENOUS

## 2014-10-23 MED ORDER — DEXTROSE 5 % IV SOLN
80.0000 mg/m2 | Freq: Once | INTRAVENOUS | Status: AC
  Administered 2014-10-23: 174 mg via INTRAVENOUS
  Filled 2014-10-23: qty 29

## 2014-10-23 MED ORDER — ACETAMINOPHEN 325 MG PO TABS
650.0000 mg | ORAL_TABLET | Freq: Once | ORAL | Status: AC
  Administered 2014-10-23: 650 mg via ORAL
  Filled 2014-10-23: qty 2

## 2014-10-23 MED ORDER — TRASTUZUMAB CHEMO INJECTION 440 MG
6.0000 mg/kg | Freq: Once | INTRAVENOUS | Status: AC
  Administered 2014-10-23: 588 mg via INTRAVENOUS
  Filled 2014-10-23: qty 28

## 2014-10-23 MED ORDER — SODIUM CHLORIDE 0.9 % IV SOLN
Freq: Once | INTRAVENOUS | Status: AC
  Administered 2014-10-23: 11:00:00 via INTRAVENOUS
  Filled 2014-10-23: qty 4

## 2014-10-23 MED ORDER — DIPHENHYDRAMINE HCL 50 MG/ML IJ SOLN
12.5000 mg | Freq: Once | INTRAMUSCULAR | Status: AC
  Administered 2014-10-23: 12.5 mg via INTRAVENOUS
  Filled 2014-10-23: qty 1

## 2014-10-23 MED ORDER — SODIUM CHLORIDE 0.9 % IJ SOLN
10.0000 mL | INTRAMUSCULAR | Status: DC | PRN
  Administered 2014-10-23: 10 mL via INTRAVENOUS
  Filled 2014-10-23: qty 10

## 2014-10-23 MED ORDER — SODIUM CHLORIDE 0.9 % IV SOLN
Freq: Once | INTRAVENOUS | Status: AC
  Administered 2014-10-23: 11:00:00 via INTRAVENOUS
  Filled 2014-10-23: qty 1000

## 2014-10-23 MED ORDER — FAMOTIDINE IN NACL 20-0.9 MG/50ML-% IV SOLN
20.0000 mg | Freq: Once | INTRAVENOUS | Status: AC
  Administered 2014-10-23: 20 mg via INTRAVENOUS
  Filled 2014-10-23: qty 50

## 2014-10-23 NOTE — Progress Notes (Signed)
Patient does have living will.  Former smoker.  Patient complains of stomach cramping. Foods go straight through her.  Feels fatigued a lot of days.

## 2014-10-23 NOTE — Progress Notes (Signed)
   10/23/14 0900  Clinical Encounter Type  Visited With Patient  Visit Type Follow-up  Spiritual Encounters  Spiritual Needs Literature  Advance Directives (For Healthcare)  Does patient have an advance directive? Yes  Copy of advanced directive(s) in chart? Yes  Assisted patient and her friend with a Advance Directive before her appointment in the cancer center.  [place] [phone]

## 2014-10-23 NOTE — Progress Notes (Signed)
[place] @ [facility] [phone]  Fax:([phone]) [place]: Oct 30, 1950  MR#: [id number]  HBZ#:[id number]  Patient Care Team: Jerrol Banana., MD as PCP - General (Family Medicine) Robert Bellow, MD as Consulting Physician (General Surgery) Nicholaus Bloom, MD as Consulting Physician (Plastic Surgery) Tammy Rondell Reams, [place] (Family Medicine)  CHIEF COMPLAINT:  Chief Complaint  Patient presents with  . Follow-up    Oncology History   1.  Abnormal mammogram in April of 2016 followed by ultrasound and based on clinical examination 1 cm tumor.  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  (July 05, 2014) 2.  Status post lumpectomy and sentinel lymph node evaluation tumor size is 1.4 cm sentinel lymph nodes negative.  Stage IC T1 cN0 M0 tumor 3.  Patient is going for breast reduction surgery in June of 2016 4.  Adjuvant chemotherapy started with Taxol and Herceptin on September 04, 2014     Carcinoma of left breast   07/05/2014 Initial Diagnosis Carcinoma of left breast    Oncology Flowsheet 09/04/2014 09/12/2014 09/18/2014 09/25/2014 10/02/2014 10/09/2014 10/16/2014  Day, Cycle   Day 8, 1 Day 15, 1 Day 1, Cycle 2 Day 8, Cycle 2 Day 15, Cycle 2 Day 1, Cycle 3  dexamethasone (DECADRON) IV 8 mg [ 10 mg ] [ 10 mg ] [ 10 mg ] [ 10 mg ] [ 10 mg ] [ 10 mg ]  ondansetron (ZOFRAN) IV   [ 8 mg ] [ 8 mg ] [ 8 mg ] [ 8 mg ] [ 8 mg ] [ 8 mg ]  PACLitaxel (TAXOL) IV   80 mg/m2 80 mg/m2 80 mg/m2 80 mg/m2 80 mg/m2 80 mg/m2  trastuzumab (HERCEPTIN) IV   - - - 6 mg/kg - -    INTERVAL HISTORY: 64 year old lady here for continuation of chemotherapy with Taxol and Herceptin 1.4 cm tumor which is HER-2 positive.  Estrogen and progesterone receptor negative. October 23, 2014 Patient is here for continuation of chemotherapy with Taxol and Herceptin. No chills fever.  Patient continues to  problem not able to sleep.  No bony pain.  No nausea or vomiting.  Intermittent tingling numbness.   REVIEW OF SYSTEMS:   GENERAL:  Feels good.  Active.  No fevers, sweats or weight loss.  PERFORMANCE STATUS (ECOG):  0 HEENT:  No visual changes, runny nose, sore throat, mouth sores or tenderness. Lungs: Dry hacking cough with expectoration Cardiac:  No chest pain, palpitations, orthopnea, or PND. GI:  No nausea, vomiting, complains of diarrhea this started 2 days after chemotherapy lasting for 2 days.  Still has 1 or 2 loose stool.  Did not take any Imodium.  Lower abdominal discomfort GU:  No urgency, frequency, dysuria, or hematuria. Musculoskeletal: Muscle aches and joint pains Extremities:  No pain or swelling. Skin:  Patient complains of rash that appears after chemotherapy and gradually disappears. Neuro:  No headache, numbness or weakness, balance or coordination issues. Endocrine:  No diabetes, thyroid issues, hot flashes or night sweats. Psych:  No mood changes, depression or anxiety. Pain:  No focal pain. Review of systems:  All other systems reviewed and found to be negative. As per HPI. Otherwise, a complete review of systems is negatve.  PAST MEDICAL HISTORY: Past Medical History  Diagnosis Date  . Hypertension   . Hyperlipidemia   . GERD (gastroesophageal reflux disease)   .  Depression 1995  . Cancer     skin   . 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 Complication of anesthesia 2004    oversensitive to noise, lights    PAST SURGICAL HISTORY: Past Surgical History  Procedure Laterality Date  . Back surgery    . Tonsillectomy    . Skin cancer  removal  2015    nose   . Colonoscopy  2013    Dr. Vira Agar  . Breast lumpectomy with axillary lymph node dissection Left 08/03/2014    Procedure: BREAST LUMPECTOMY WITH AXILLARY LYMPH NODE DISSECTION;  Surgeon: Robert Bellow, MD;  Location: [facility] ORS;   Service: General;  Laterality: Left;  . Sentinel node biopsy Left 08/03/2014    Procedure: SENTINEL NODE BIOPSY;  Surgeon: Robert Bellow, MD;  Location: [facility] ORS;  Service: General;  Laterality: Left;  . Axillary lymph node biopsy Left 08/03/2014    Procedure: AXILLARY LYMPH NODE BIOPSY;  Surgeon: Robert Bellow, MD;  Location: [facility] ORS;  Service: General;  Laterality: Left;  . Breast surgery Left Aug 03, 2014    Wide excision, sentinel node biopsy.  . Portacath placement Right 08/31/2014    Procedure: INSERTION PORT-A-CATH;  Surgeon: Robert Bellow, MD;  Location: [facility] ORS;  Service: General;  Laterality: Right;    FAMILY HISTORY Family History  Problem Relation Age of Onset  . Breast cancer Mother 57       . Breast cancer Cousin 62         ADVANCED DIRECTIVES:  Patient does not have any living will or healthcare power of attorney.  Information was given .  Available resources had been discussed.  We will follow-up on subsequent appointments regarding this issue  HEALTH MAINTENANCE: Social History  Substance Use Topics  . Smoking status: Former Smoker -- 1.00 packs/day for 2 years    Types: Cigarettes    Quit date: 07/30/1976  . Smokeless tobacco: None  . Alcohol Use: 0.0 - 1.2 oz/week    0-1 Glasses of wine, 0-1 Shots of liquor, 0 Standard drinks or equivalent per week      Allergies  Allergen Reactions  . Sulfa Antibiotics Hives and Rash    Current Outpatient Prescriptions  Medication Sig Dispense Refill  . azithromycin (ZITHROMAX) 500 MG tablet Take 1 tablet (500 mg total) by mouth daily. 3 tablet 0  . cephALEXin (KEFLEX) 500 MG capsule Take 500 mg by mouth 2 (two) times daily.    . cetirizine (ZYRTEC) 10 MG tablet Take 10 mg by mouth every morning. As needed for seasonal allergies.  3  . chlorpheniramine-HYDROcodone (TUSSIONEX) 10-8 MG/5ML SUER Take 5 mLs by mouth every 12 (twelve) hours as needed for cough. 140 mL 0  . docusate sodium (COLACE) 100 MG capsule  Take 100 mg by mouth daily as needed for mild constipation.    Marland Kitchen HYDROcodone-acetaminophen (NORCO) 5-325 MG per tablet Take 1-2 tablets by mouth every 4 (four) hours as needed for moderate pain or severe pain. 30 tablet 0  . KLOR-CON M20 20 MEQ tablet Take 20 mEq by mouth every morning.   4  . lidocaine-prilocaine (EMLA) cream Apply 1 application topically as needed. 30 g 3  . omeprazole (PRILOSEC) 40 MG capsule Take 40 mg by mouth every morning.   4  . ondansetron (ZOFRAN) 8 MG tablet Take 1 tablet (8 mg total) by mouth 2 (two) times daily. Start the  day after chemo for 2 days. Then take as needed for nausea or vomiting. 30 tablet 1  . pravastatin (PRAVACHOL) 40 MG tablet Take 40 mg by mouth every morning.   3  . sertraline (ZOLOFT) 100 MG tablet Take 100 mg by mouth every morning.   4  . triamterene-hydrochlorothiazide (MAXZIDE) 75-50 MG per tablet Take 1 tablet by mouth every morning.   4   No current facility-administered medications for this visit.   Facility-Administered Medications Ordered in Other Visits  Medication Dose Route Frequency Provider Last Rate Last Dose  . acetaminophen (TYLENOL) tablet 650 mg  650 mg Oral Once Forest Gleason, MD      . diphenhydrAMINE (BENADRYL) capsule 50 mg  50 mg Oral Once Forest Gleason, MD      . sodium chloride 0.9 % injection 10 mL  10 mL Intravenous PRN Forest Gleason, MD   10 mL at 09/25/14 1418  . sodium chloride 0.9 % injection 10 mL  10 mL Intracatheter PRN Forest Gleason, MD      . sodium chloride 0.9 % injection 10 mL  10 mL Intracatheter PRN Forest Gleason, MD   10 mL at 10/02/14 0900    OBJECTIVE:  Filed Vitals:   10/23/14 0958  BP: 145/77  Pulse: 90  Temp: 98 F (36.7 C)     Body mass index is 36.82 kg/(m^2).    ECOG FS:0 - Asymptomatic  PHYSICAL EXAM: General  status: Performance status is good.  Patient has not lost significant weight HEENT: No evidence of stomatitis. Sclera and conjunctivae :: No jaundice.   pale looking. Lungs: Air   entry equal on both sides.  No rhonchi.  No rales.  Cardiac: Heart sounds are normal.  No pericardial rub.  No murmur. Lymphatic system: Cervical, axillary, inguinal, lymph nodes not palpable GI: Abdomen is soft.  No ascites.  Liver spleen not palpable.  No tenderness.  Bowel sounds are within normal limit Lower extremity: No edema Neurological system: Higher functions, cranial nerves intact no evidence of peripheral neuropathy. Skin: No rash.  No ecchymosis.. Bilateral breast mammoplasty  Port site within normal limit healing well. Alopecia. No stomatitis   LAB RESULTS:  Clinical Support on 10/23/2014  Component Date Value Ref Range Status  . WBC 10/23/2014 5.5  3.6 - 11.0 K/uL Final  . RBC 10/23/2014 4.29  3.80 - 5.20 MIL/uL Final  . Hemoglobin 10/23/2014 12.5  12.0 - 16.0 g/dL Final  . HCT 10/23/2014 36.9  35.0 - 47.0 % Final  . MCV 10/23/2014 86.2  80.0 - 100.0 fL Final  . MCH 10/23/2014 29.2  26.0 - 34.0 pg Final  . MCHC 10/23/2014 33.9  32.0 - 36.0 g/dL Final  . RDW 10/23/2014 17.6* 11.5 - 14.5 % Final  . Platelets 10/23/2014 209  150 - 440 K/uL Final  . Neutrophils Relative % 10/23/2014 53   Final  . Neutro Abs 10/23/2014 2.9  1.4 - 6.5 K/uL Final  . Lymphocytes Relative 10/23/2014 39   Final  . Lymphs Abs 10/23/2014 2.2  1.0 - 3.6 K/uL Final  . Monocytes Relative 10/23/2014 6   Final  . Monocytes Absolute 10/23/2014 0.3  0.2 - 0.9 K/uL Final  . Eosinophils Relative 10/23/2014 1   Final  . Eosinophils Absolute 10/23/2014 0.1  0 - 0.7 K/uL Final  . Basophils Relative 10/23/2014 1   Final  . Basophils Absolute 10/23/2014 0.1  0 - 0.1 K/uL Final  . Magnesium 10/23/2014 1.7  1.7 - 2.4 mg/dL  Final   A-LINE DRAW      STUDIES: Nm Cardiac Muga Rest  09/29/2014   CLINICAL DATA:  Breast cancer.  High risk chemotherapy.  EXAM: NUCLEAR MEDICINE CARDIAC BLOOD POOL IMAGING (MUGA)  TECHNIQUE: Cardiac multi-gated acquisition was performed at rest following intravenous injection of  Tc-11mlabeled red blood cells.  RADIOPHARMACEUTICALS:  22.56 mCi Tc-963mDP in-vitro labeled red blood cells IV  COMPARISON:  None.  FINDINGS: The left ventricular ejection fraction equals 62%. Normal left ventricular wall motion.  IMPRESSION: 1. Left ventricular ejection fraction equals 62%   Electronically Signed   By: TaKerby Moors.D.   On: 09/29/2014 09:46    ASSESSMENT: Carcinoma of breast status post lumpectomy and sentinel lymph node (left breast) stage IC Estrogen and progesterone receptor negative HER-2/neu positive tumor Status post mammoplasty on both breast Lab data has been reviewed. Therapy with Taxol and Herceptin will be continued  MEDICAL DECISION MAKING:   Lab data has been reviewed.  No significant side effect except for sleep disturbance some of the problem may be due to depression and some of the problem may be the steroid to continue to monitor   Patient expressed understanding and was in agreement with this plan. She also understands that She can call clinic at any time with any questions, concerns, or complaints.    Carcinoma of left breast   Staging form: Breast, AJCC 7th Edition     Clinical: Stage IA (T1c, N0, M0) - UnMarni GriffonMD   10/23/2014 10:31 AM  [facility]) [phone]:(336) [phone]   GeSERENAH MILLB: 10August 25, 1952MR#: [id number]#:[id number] Care Team: RiJerrol Banana MD as PCP - General (Family Medicine) JeRobert BellowMD as Consulting Physician (General Surgery) BrNicholaus BloomMD as Consulting Physician (Plastic Surgery) Tammy HaRondell ReamsFNLos [facility]queFamily Medicine)  CHIEF COMPLAINT:  Chief Complaint  Patient presents with  . Follow-up    Oncology History   1.  Abnormal mammogram in April of 2016 followed by ultrasound and based on clinical examination 1 cm tumor.  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  Estrogen and progesterone receptor negative  and HER-2/neu positive tumor.  (July 05, 2014) 2.  Status post lumpectomy and sentinel lymph node evaluation tumor size is 1.4 cm sentinel lymph nodes negative.  Stage IC T1 cN0 M0 tumor 3.  Patient is going for breast reduction surgery in June of 2016 4.  Adjuvant chemotherapy started with Taxol and Herceptin on September 04, 2014     Carcinoma of left breast   07/05/2014 Initial Diagnosis Carcinoma of left breast    Oncology Flowsheet 09/12/2014 09/18/2014 09/25/2014 10/02/2014 10/09/2014 10/16/2014 10/23/2014  Day, Cycle Day 8, 1 Day 15, 1 Day 1, Cycle 2 Day 8, Cycle 2 Day 15, Cycle 2 Day 1, Cycle 3 Day 8, Cycle 3  dexamethasone (DECADRON) IV [ 10 mg ] [ 10 mg ] [ 10 mg ] [ 10 mg ] [ 10 mg ] [ 10 mg ] [ 10 mg ]  ondansetron (ZOFRAN) IV [ 8 mg ] [ 8 mg ] [ 8 mg ] [ 8 mg ] [ 8 mg ] [ 8 mg ] [ 8 mg ]  PACLitaxel (TAXOL) IV 80 mg/m2 80 mg/m2 80 mg/m2 80 mg/m2 80 mg/m2 80 mg/m2 80 mg/m2  trastuzumab (HERCEPTIN) IV - - - 6 mg/kg - - 6 mg/kg    INTERVAL  HISTORY: 64 year old lady here for continuation of chemotherapy with Taxol and Herceptin 1.4 cm tumor which is HER-2 positive.  Estrogen and progesterone receptor negative.  REVIEW OF SYSTEMS:   GENERAL:  Feels good.  Active.  No fevers, sweats or weight loss.  PERFORMANCE STATUS (ECOG):  0 HEENT:  No visual changes, runny nose, sore throat, mouth sores or tenderness. Lungs: Dry hacking cough with expectoration Cardiac:  No chest pain, palpitations, orthopnea, or PND. GI:  No nausea, vomiting, complains of diarrhea this started 2 days after chemotherapy lasting for 2 days.  Still has 1 or 2 loose stool.  Did not take any Imodium.  Lower abdominal discomfort GU:  No urgency, frequency, dysuria, or hematuria. Musculoskeletal: Muscle aches and joint pains Extremities:  No pain or swelling. Skin:  Patient complains of rash that appears after chemotherapy and gradually disappears. Neuro:  No headache, numbness or weakness, balance or coordination  issues. Endocrine:  No diabetes, thyroid issues, hot flashes or night sweats. Psych:  No mood changes, depression or anxiety. Pain:  No focal pain. Review of systems:  All other systems reviewed and found to be negative. As per HPI. Otherwise, a complete review of systems is negatve.  PAST MEDICAL HISTORY: Past Medical History  Diagnosis Date  . Hypertension   . Hyperlipidemia   . GERD (gastroesophageal reflux disease)   . Depression 1995  . Cancer     skin   . 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 Complication of anesthesia 2004    oversensitive to noise, lights    PAST SURGICAL HISTORY: Past Surgical History  Procedure Laterality Date  . Back surgery    . Tonsillectomy    . Skin cancer  removal  2015    nose   . Colonoscopy  2013    Dr. Vira Agar  . Breast lumpectomy with axillary lymph node dissection Left 08/03/2014    Procedure: BREAST LUMPECTOMY WITH AXILLARY LYMPH NODE DISSECTION;  Surgeon: Robert Bellow, MD;  Location: [facility] OR[facility];  Service: General;  Laterality: Left;  . Sentinel node biopsy Left 08/03/2014    Procedure: SENTINEL NODE BIOPSY;  Surgeon: Robert Bellow, MD;  Location: [facility];  Service: General;  Laterality: Left;  . Axillary lymph node biopsy Left 08/03/2014    Procedure: AXILLARY LYMPH NODE BIOPSY;  Surgeon: Robert Bellow, MD;  Location: [facility];  Service: General;  Laterality: Left;  . Breast surgery Left Aug 03, 2014    Wide excision, sentinel node biopsy.  . Portacath placement Right 08/31/2014    Procedure: INSERTION PORT-A-CATH;  Surgeon: Robert Bellow, MD;  Location: [facility];  Service: General;  Laterality: Right;    FAMILY HISTORY Family History  Problem Relation Age of Onset  . Breast cancer Mother 71       . Breast cancer Cousin 62         ADVANCED DIRECTIVES:  Patient does not have any living will or healthcare power of attorney.  Information was given .   Available resources had been discussed.  We will follow-up on subsequent appointments regarding this issue  HEALTH MAINTENANCE: Social History  Substance Use Topics  . Smoking status: Former Smoker -- 1.00 packs/day for 2 years    Types: Cigarettes    Quit date: 07/30/1976  . Smokeless tobacco: None  . Alcohol Use: 0.0 - 1.2 oz/week    0-1 Glasses of wine, 0-1 Shots of liquor,  0 Standard drinks or equivalent per week      Allergies  Allergen Reactions  . Sulfa Antibiotics Hives and Rash    Current Outpatient Prescriptions  Medication Sig Dispense Refill  . azithromycin (ZITHROMAX) 500 MG tablet Take 1 tablet (500 mg total) by mouth daily. 3 tablet 0  . cephALEXin (KEFLEX) 500 MG capsule Take 500 mg by mouth 2 (two) times daily.    . cetirizine (ZYRTEC) 10 MG tablet Take 10 mg by mouth every morning. As needed for seasonal allergies.  3  . chlorpheniramine-HYDROcodone (TUSSIONEX) 10-8 MG/5ML SUER Take 5 mLs by mouth every 12 (twelve) hours as needed for cough. 140 mL 0  . docusate sodium (COLACE) 100 MG capsule Take 100 mg by mouth daily as needed for mild constipation.    Marland Kitchen HYDROcodone-acetaminophen (NORCO) 5-325 MG per tablet Take 1-2 tablets by mouth every 4 (four) hours as needed for moderate pain or severe pain. 30 tablet 0  . KLOR-CON M20 20 MEQ tablet Take 20 mEq by mouth every morning.   4  . lidocaine-prilocaine (EMLA) cream Apply 1 application topically as needed. 30 g 3  . omeprazole (PRILOSEC) 40 MG capsule Take 40 mg by mouth every morning.   4  . ondansetron (ZOFRAN) 8 MG tablet Take 1 tablet (8 mg total) by mouth 2 (two) times daily. Start the day after chemo for 2 days. Then take as needed for nausea or vomiting. 30 tablet 1  . pravastatin (PRAVACHOL) 40 MG tablet Take 40 mg by mouth every morning.   3  . sertraline (ZOLOFT) 100 MG tablet Take 100 mg by mouth every morning.   4  . triamterene-hydrochlorothiazide (MAXZIDE) 75-50 MG per tablet Take 1 tablet by mouth  every morning.   4   No current facility-administered medications for this visit.   Facility-Administered Medications Ordered in Other Visits  Medication Dose Route Frequency Provider Last Rate Last Dose  . acetaminophen (TYLENOL) tablet 650 mg  650 mg Oral Once Forest Gleason, MD      . diphenhydrAMINE (BENADRYL) capsule 50 mg  50 mg Oral Once Forest Gleason, MD      . sodium chloride 0.9 % injection 10 mL  10 mL Intravenous PRN Forest Gleason, MD   10 mL at 09/25/14 1418  . sodium chloride 0.9 % injection 10 mL  10 mL Intracatheter PRN Forest Gleason, MD      . sodium chloride 0.9 % injection 10 mL  10 mL Intracatheter PRN Forest Gleason, MD   10 mL at 10/02/14 0900    OBJECTIVE:  Filed Vitals:   10/23/14 0958  BP: 145/77  Pulse: 90  Temp: 98 F (36.7 C)     Body mass index is 36.82 kg/(m^2).    ECOG FS:0 - Asymptomatic  PHYSICAL EXAM: General  status: Performance status is good.  Patient has not lost significant weight HEENT: No evidence of stomatitis. Sclera and conjunctivae :: No jaundice.   pale looking. Lungs: Air  entry equal on both sides.  No rhonchi.  No rales.  Cardiac: Heart sounds are normal.  No pericardial rub.  No murmur. Lymphatic system: Cervical, axillary, inguinal, lymph nodes not palpable GI: Abdomen is soft.  No ascites.  Liver spleen not palpable.  No tenderness.  Bowel sounds are within normal limit Lower extremity: No edema Neurological system: Higher functions, cranial nerves intact no evidence of peripheral neuropathy. Skin: No rash.  No ecchymosis.. Bilateral breast mammoplasty  Port site within normal limit healing well.  Alopecia. No stomatitis   LAB RESULTS:  Clinical Support on 10/23/2014  Component Date Value Ref Range Status  . WBC 10/23/2014 5.5  3.6 - 11.0 K/uL Final  . RBC 10/23/2014 4.29  3.80 - 5.20 MIL/uL Final  . Hemoglobin 10/23/2014 12.5  12.0 - 16.0 g/dL Final  . HCT 10/23/2014 36.9  35.0 - 47.0 % Final  . MCV 10/23/2014 86.2  80.0 -  100.0 fL Final  . MCH 10/23/2014 29.2  26.0 - 34.0 pg Final  . MCHC 10/23/2014 33.9  32.0 - 36.0 g/dL Final  . RDW 10/23/2014 17.6* 11.5 - 14.5 % Final  . Platelets 10/23/2014 209  150 - 440 K/uL Final  . Neutrophils Relative % 10/23/2014 53   Final  . Neutro Abs 10/23/2014 2.9  1.4 - 6.5 K/uL Final  . Lymphocytes Relative 10/23/2014 39   Final  . Lymphs Abs 10/23/2014 2.2  1.0 - 3.6 K/uL Final  . Monocytes Relative 10/23/2014 6   Final  . Monocytes Absolute 10/23/2014 0.3  0.2 - 0.9 K/uL Final  . Eosinophils Relative 10/23/2014 1   Final  . Eosinophils Absolute 10/23/2014 0.1  0 - 0.7 K/uL Final  . Basophils Relative 10/23/2014 1   Final  . Basophils Absolute 10/23/2014 0.1  0 - 0.1 K/uL Final  . Magnesium 10/23/2014 1.7  1.7 - 2.4 mg/dL Final   A-LINE DRAW      STUDIES: Nm Cardiac Muga Rest  09/29/2014   CLINICAL DATA:  Breast cancer.  High risk chemotherapy.  EXAM: NUCLEAR MEDICINE CARDIAC BLOOD POOL IMAGING (MUGA)  TECHNIQUE: Cardiac multi-gated acquisition was performed at rest following intravenous injection of Tc-23mlabeled red blood cells.  RADIOPHARMACEUTICALS:  22.56 mCi Tc-926mDP in-vitro labeled red blood cells IV  COMPARISON:  None.  FINDINGS: The left ventricular ejection fraction equals 62%. Normal left ventricular wall motion.  IMPRESSION: 1. Left ventricular ejection fraction equals 62%   Electronically Signed   By: TaKerby Moors.D.   On: 09/29/2014 09:46    ASSESSMENT: Carcinoma of breast status post lumpectomy and sentinel lymph node (left breast) stage IC Estrogen and progesterone receptor negative HER-2/neu positive tumor Status post mammoplasty on both breast Lab data has been reviewed. Therapy with Taxol and Herceptin will be continued  MEDICAL DECISION MAKING:   All lab data has been reviewed Diarrhea has resolved but patient has no muscle aches   Patient expressed understanding and was in agreement with this plan. She also understands that She can  call clinic at any time with any questions, concerns, or complaints.    Carcinoma of left breast   Staging form: Breast, AJCC 7th Edition     Clinical: Stage IA (T1c, N0, M0) - UnMarni GriffonMD   10/23/2014 8:30 PM

## 2014-10-30 ENCOUNTER — Inpatient Hospital Stay: Payer: BLUE CROSS/BLUE SHIELD

## 2014-10-30 ENCOUNTER — Other Ambulatory Visit: Payer: Self-pay | Admitting: Family Medicine

## 2014-10-30 ENCOUNTER — Encounter: Payer: Self-pay | Admitting: Oncology

## 2014-10-30 VITALS — BP 118/73 | HR 80 | Resp 20

## 2014-10-30 DIAGNOSIS — C50912 Malignant neoplasm of unspecified site of left female breast: Secondary | ICD-10-CM

## 2014-10-30 DIAGNOSIS — C801 Malignant (primary) neoplasm, unspecified: Secondary | ICD-10-CM

## 2014-10-30 LAB — COMPREHENSIVE METABOLIC PANEL
ALBUMIN: 4.1 g/dL (ref 3.5–5.0)
ALT: 37 U/L (ref 14–54)
AST: 39 U/L (ref 15–41)
Alkaline Phosphatase: 44 U/L (ref 38–126)
Anion gap: 8 (ref 5–15)
BILIRUBIN TOTAL: 1.1 mg/dL (ref 0.3–1.2)
BUN: 16 mg/dL (ref 6–20)
CHLORIDE: 101 mmol/L (ref 101–111)
CO2: 28 mmol/L (ref 22–32)
CREATININE: 1.03 mg/dL — AB (ref 0.44–1.00)
Calcium: 9.1 mg/dL (ref 8.9–10.3)
GFR calc Af Amer: >60 mL/min (ref >60)
GFR, EST NON AFRICAN AMERICAN: 57 mL/min — AB (ref >60)
GLUCOSE: 121 mg/dL — AB (ref 65–99)
Potassium: 3 mmol/L — ABNORMAL LOW (ref 3.5–5.1)
Sodium: 137 mmol/L (ref 135–145)
Total Protein: 7.3 g/dL (ref 6.5–8.1)

## 2014-10-30 LAB — CBC WITH DIFFERENTIAL/PLATELET
BASOS ABS: 0.1 10*3/uL (ref 0–0.1)
Basophils Relative: 1 %
Eosinophils Absolute: 0.1 10*3/uL (ref 0–0.7)
Eosinophils Relative: 1 %
HEMATOCRIT: 38.7 % (ref 35.0–47.0)
Hemoglobin: 13.3 g/dL (ref 12.0–16.0)
LYMPHS PCT: 42 %
Lymphs Abs: 2.6 10*3/uL (ref 1.0–3.6)
MCH: 29.4 pg (ref 26.0–34.0)
MCHC: 34.5 g/dL (ref 32.0–36.0)
MCV: 85.3 fL (ref 80.0–100.0)
Monocytes Absolute: 0.4 10*3/uL (ref 0.2–0.9)
Monocytes Relative: 7 %
NEUTROS ABS: 2.9 10*3/uL (ref 1.4–6.5)
Neutrophils Relative %: 49 %
PLATELETS: 217 10*3/uL (ref 150–440)
RBC: 4.54 MIL/uL (ref 3.80–5.20)
RDW: 18.3 % — ABNORMAL HIGH (ref 11.5–14.5)
WBC: 6.1 10*3/uL (ref 3.6–11.0)

## 2014-10-30 LAB — MAGNESIUM: MAGNESIUM: 1.7 mg/dL (ref 1.7–2.4)

## 2014-10-30 MED ORDER — SODIUM CHLORIDE 0.9 % IV SOLN
Freq: Once | INTRAVENOUS | Status: AC
  Administered 2014-10-30: 11:00:00 via INTRAVENOUS
  Filled 2014-10-30: qty 4

## 2014-10-30 MED ORDER — HEPARIN SOD (PORK) LOCK FLUSH 100 UNIT/ML IV SOLN
500.0000 [IU] | Freq: Once | INTRAVENOUS | Status: AC | PRN
  Administered 2014-10-30: 500 [IU]
  Filled 2014-10-30: qty 5

## 2014-10-30 MED ORDER — SODIUM CHLORIDE 0.9 % IV SOLN
Freq: Once | INTRAVENOUS | Status: AC
  Administered 2014-10-30: 10:00:00 via INTRAVENOUS
  Filled 2014-10-30: qty 1000

## 2014-10-30 MED ORDER — FAMOTIDINE IN NACL 20-0.9 MG/50ML-% IV SOLN
20.0000 mg | Freq: Once | INTRAVENOUS | Status: AC
Start: 2014-10-30 — End: 2014-10-30
  Administered 2014-10-30: 20 mg via INTRAVENOUS
  Filled 2014-10-30: qty 50

## 2014-10-30 MED ORDER — PACLITAXEL CHEMO INJECTION 300 MG/50ML
72.0000 mg/m2 | Freq: Once | INTRAVENOUS | Status: AC
  Administered 2014-10-30: 156 mg via INTRAVENOUS
  Filled 2014-10-30: qty 26

## 2014-10-30 MED ORDER — DIPHENHYDRAMINE HCL 50 MG/ML IJ SOLN
12.5000 mg | Freq: Once | INTRAMUSCULAR | Status: AC
  Administered 2014-10-30: 12.5 mg via INTRAVENOUS
  Filled 2014-10-30: qty 1

## 2014-11-06 ENCOUNTER — Encounter: Payer: Self-pay | Admitting: Oncology

## 2014-11-06 ENCOUNTER — Inpatient Hospital Stay (HOSPITAL_BASED_OUTPATIENT_CLINIC_OR_DEPARTMENT_OTHER): Payer: BLUE CROSS/BLUE SHIELD | Admitting: Oncology

## 2014-11-06 ENCOUNTER — Inpatient Hospital Stay: Payer: BLUE CROSS/BLUE SHIELD

## 2014-11-06 VITALS — BP 144/82 | HR 108 | Temp 96.4°F | Wt 221.4 lb

## 2014-11-06 DIAGNOSIS — Z171 Estrogen receptor negative status [ER-]: Secondary | ICD-10-CM | POA: Diagnosis not present

## 2014-11-06 DIAGNOSIS — E785 Hyperlipidemia, unspecified: Secondary | ICD-10-CM

## 2014-11-06 DIAGNOSIS — I10 Essential (primary) hypertension: Secondary | ICD-10-CM

## 2014-11-06 DIAGNOSIS — C50912 Malignant neoplasm of unspecified site of left female breast: Secondary | ICD-10-CM

## 2014-11-06 DIAGNOSIS — Z803 Family history of malignant neoplasm of breast: Secondary | ICD-10-CM

## 2014-11-06 DIAGNOSIS — G4700 Insomnia, unspecified: Secondary | ICD-10-CM

## 2014-11-06 DIAGNOSIS — K219 Gastro-esophageal reflux disease without esophagitis: Secondary | ICD-10-CM

## 2014-11-06 DIAGNOSIS — F329 Major depressive disorder, single episode, unspecified: Secondary | ICD-10-CM

## 2014-11-06 DIAGNOSIS — Z85820 Personal history of malignant melanoma of skin: Secondary | ICD-10-CM

## 2014-11-06 DIAGNOSIS — C801 Malignant (primary) neoplasm, unspecified: Secondary | ICD-10-CM

## 2014-11-06 DIAGNOSIS — Z87891 Personal history of nicotine dependence: Secondary | ICD-10-CM

## 2014-11-06 DIAGNOSIS — Z79899 Other long term (current) drug therapy: Secondary | ICD-10-CM

## 2014-11-06 LAB — COMPREHENSIVE METABOLIC PANEL
ALK PHOS: 45 U/L (ref 38–126)
ALT: 35 U/L (ref 14–54)
AST: 36 U/L (ref 15–41)
Albumin: 4.2 g/dL (ref 3.5–5.0)
Anion gap: 10 (ref 5–15)
BILIRUBIN TOTAL: 1.2 mg/dL (ref 0.3–1.2)
BUN: 20 mg/dL (ref 6–20)
CALCIUM: 8.9 mg/dL (ref 8.9–10.3)
CHLORIDE: 102 mmol/L (ref 101–111)
CO2: 26 mmol/L (ref 22–32)
CREATININE: 1.19 mg/dL — AB (ref 0.44–1.00)
GFR, EST AFRICAN AMERICAN: 55 mL/min — AB (ref >60)
GFR, EST NON AFRICAN AMERICAN: 48 mL/min — AB (ref >60)
Glucose, Bld: 123 mg/dL — ABNORMAL HIGH (ref 65–99)
Potassium: 2.9 mmol/L — CL (ref 3.5–5.1)
Sodium: 138 mmol/L (ref 135–145)
TOTAL PROTEIN: 7.4 g/dL (ref 6.5–8.1)

## 2014-11-06 LAB — CBC WITH DIFFERENTIAL/PLATELET
BASOS ABS: 0.1 10*3/uL (ref 0–0.1)
Basophils Relative: 1 %
EOS PCT: 1 %
Eosinophils Absolute: 0.1 10*3/uL (ref 0–0.7)
HEMATOCRIT: 37.9 % (ref 35.0–47.0)
HEMOGLOBIN: 12.9 g/dL (ref 12.0–16.0)
LYMPHS ABS: 2.7 10*3/uL (ref 1.0–3.6)
LYMPHS PCT: 43 %
MCH: 29.6 pg (ref 26.0–34.0)
MCHC: 34 g/dL (ref 32.0–36.0)
MCV: 87.1 fL (ref 80.0–100.0)
Monocytes Absolute: 0.5 10*3/uL (ref 0.2–0.9)
Monocytes Relative: 8 %
NEUTROS ABS: 2.9 10*3/uL (ref 1.4–6.5)
Neutrophils Relative %: 47 %
PLATELETS: 217 10*3/uL (ref 150–440)
RBC: 4.35 MIL/uL (ref 3.80–5.20)
RDW: 19.4 % — ABNORMAL HIGH (ref 11.5–14.5)
WBC: 6.3 10*3/uL (ref 3.6–11.0)

## 2014-11-06 LAB — MAGNESIUM: MAGNESIUM: 1.7 mg/dL (ref 1.7–2.4)

## 2014-11-06 MED ORDER — HEPARIN SOD (PORK) LOCK FLUSH 100 UNIT/ML IV SOLN
500.0000 [IU] | Freq: Once | INTRAVENOUS | Status: AC
  Administered 2014-11-06: 500 [IU] via INTRAVENOUS

## 2014-11-06 MED ORDER — DIPHENHYDRAMINE HCL 50 MG/ML IJ SOLN
12.5000 mg | Freq: Once | INTRAMUSCULAR | Status: AC
  Administered 2014-11-06: 12.5 mg via INTRAVENOUS
  Filled 2014-11-06: qty 1

## 2014-11-06 MED ORDER — SODIUM CHLORIDE 0.9 % IV SOLN
10.0000 meq | Freq: Once | INTRAVENOUS | Status: AC
  Administered 2014-11-06: 10 meq via INTRAVENOUS
  Filled 2014-11-06: qty 5

## 2014-11-06 MED ORDER — HEPARIN SOD (PORK) LOCK FLUSH 100 UNIT/ML IV SOLN
500.0000 [IU] | Freq: Once | INTRAVENOUS | Status: AC | PRN
  Filled 2014-11-06: qty 5

## 2014-11-06 MED ORDER — PACLITAXEL CHEMO INJECTION 300 MG/50ML
60.0000 mg/m2 | Freq: Once | INTRAVENOUS | Status: AC
  Administered 2014-11-06: 132 mg via INTRAVENOUS
  Filled 2014-11-06: qty 22

## 2014-11-06 MED ORDER — SODIUM CHLORIDE 0.9 % IV SOLN
Freq: Once | INTRAVENOUS | Status: AC
  Administered 2014-11-06: 11:00:00 via INTRAVENOUS
  Filled 2014-11-06: qty 1000

## 2014-11-06 MED ORDER — FAMOTIDINE IN NACL 20-0.9 MG/50ML-% IV SOLN
20.0000 mg | Freq: Once | INTRAVENOUS | Status: AC
  Administered 2014-11-06: 20 mg via INTRAVENOUS
  Filled 2014-11-06: qty 50

## 2014-11-06 MED ORDER — PACLITAXEL CHEMO INJECTION 300 MG/50ML
60.0000 mg/m2 | Freq: Once | INTRAVENOUS | Status: DC
  Filled 2014-11-06: qty 22

## 2014-11-06 MED ORDER — SODIUM CHLORIDE 0.9 % IV SOLN: Freq: Once | INTRAVENOUS | Status: DC

## 2014-11-06 MED ORDER — SODIUM CHLORIDE 0.9 % IJ SOLN
10.0000 mL | INTRAMUSCULAR | Status: DC | PRN
  Administered 2014-11-06: 10 mL via INTRAVENOUS
  Filled 2014-11-06: qty 10

## 2014-11-06 MED ORDER — SODIUM CHLORIDE 0.9 % IV SOLN
Freq: Once | INTRAVENOUS | Status: AC
  Administered 2014-11-06: 11:00:00 via INTRAVENOUS
  Filled 2014-11-06: qty 4

## 2014-11-06 NOTE — Progress Notes (Signed)
MD aware of potassium 2.9, 23mEq IV ordered for 8/29.

## 2014-11-06 NOTE — Progress Notes (Signed)
   11/06/14 0935  Clinical Encounter Type  Visited With Patient  Visit Type Follow-up  Provided pastoral support and presence to patient and her friend in the cancer center.  [place] [phone]

## 2014-11-06 NOTE — Progress Notes (Signed)
Patient does have living will.  Former smoker.  Patient c/o neuropathy in hands and feet.  Bottoms of feet and toes are completely numb. Cannot turn her electric toothbrush on. Has problems with zippers.

## 2014-11-09 ENCOUNTER — Telehealth: Payer: Self-pay | Admitting: *Deleted

## 2014-11-09 ENCOUNTER — Telehealth: Payer: Self-pay

## 2014-11-09 DIAGNOSIS — C50912 Malignant neoplasm of unspecified site of left female breast: Secondary | ICD-10-CM

## 2014-11-09 NOTE — Telephone Encounter (Signed)
Patient is weak and jittery.  Has had 10-11 BM's since yesterday.  Does she need to be seen? Please advise.

## 2014-11-09 NOTE — Telephone Encounter (Signed)
Patient called.  States had Taxol on Monday.  Feels extremely weak, and jittery.  Has had 10-12 bowel movements in past two days. Not watery stools, but occur right after eating anything.  Information given to [facility] RN.  She will report to Dr. Oliva Bustard, and will contact patient with recommendation.  Oncology Nurse Navigator Documentation    Navigator Encounter Type: Telephone;Treatment (11/09/14 1200) Patient Visit Type: Medonc (11/09/14 1200) Treatment Phase: Other (3 days post 10th Taxol treatment) (11/09/14 1200)     Interventions: Coordination of Care (11/09/14 1200)            Time Spent with Patient: 15 (11/09/14 1200)

## 2014-11-09 NOTE — Telephone Encounter (Signed)
Per Dr. Oliva Bustard, pt to see Dr. Mike Gip tomorrow with labs and for poss IVF.

## 2014-11-10 ENCOUNTER — Telehealth: Payer: Self-pay | Admitting: *Deleted

## 2014-11-10 ENCOUNTER — Inpatient Hospital Stay (HOSPITAL_BASED_OUTPATIENT_CLINIC_OR_DEPARTMENT_OTHER): Payer: BLUE CROSS/BLUE SHIELD | Admitting: Hematology and Oncology

## 2014-11-10 ENCOUNTER — Telehealth: Payer: Self-pay

## 2014-11-10 ENCOUNTER — Inpatient Hospital Stay: Payer: BLUE CROSS/BLUE SHIELD | Attending: Oncology

## 2014-11-10 ENCOUNTER — Encounter: Payer: Self-pay | Admitting: Oncology

## 2014-11-10 ENCOUNTER — Inpatient Hospital Stay: Payer: BLUE CROSS/BLUE SHIELD

## 2014-11-10 VITALS — BP 135/82 | HR 103 | Temp 96.8°F | Wt 222.2 lb

## 2014-11-10 DIAGNOSIS — G629 Polyneuropathy, unspecified: Secondary | ICD-10-CM | POA: Insufficient documentation

## 2014-11-10 DIAGNOSIS — Z85828 Personal history of other malignant neoplasm of skin: Secondary | ICD-10-CM | POA: Diagnosis not present

## 2014-11-10 DIAGNOSIS — I10 Essential (primary) hypertension: Secondary | ICD-10-CM

## 2014-11-10 DIAGNOSIS — F329 Major depressive disorder, single episode, unspecified: Secondary | ICD-10-CM

## 2014-11-10 DIAGNOSIS — R630 Anorexia: Secondary | ICD-10-CM | POA: Insufficient documentation

## 2014-11-10 DIAGNOSIS — Z171 Estrogen receptor negative status [ER-]: Secondary | ICD-10-CM | POA: Insufficient documentation

## 2014-11-10 DIAGNOSIS — R112 Nausea with vomiting, unspecified: Secondary | ICD-10-CM | POA: Diagnosis not present

## 2014-11-10 DIAGNOSIS — R5383 Other fatigue: Secondary | ICD-10-CM | POA: Diagnosis not present

## 2014-11-10 DIAGNOSIS — C50912 Malignant neoplasm of unspecified site of left female breast: Secondary | ICD-10-CM | POA: Insufficient documentation

## 2014-11-10 DIAGNOSIS — Z79899 Other long term (current) drug therapy: Secondary | ICD-10-CM | POA: Diagnosis not present

## 2014-11-10 DIAGNOSIS — R197 Diarrhea, unspecified: Secondary | ICD-10-CM | POA: Insufficient documentation

## 2014-11-10 DIAGNOSIS — K219 Gastro-esophageal reflux disease without esophagitis: Secondary | ICD-10-CM | POA: Insufficient documentation

## 2014-11-10 DIAGNOSIS — Z87891 Personal history of nicotine dependence: Secondary | ICD-10-CM | POA: Insufficient documentation

## 2014-11-10 DIAGNOSIS — E876 Hypokalemia: Secondary | ICD-10-CM

## 2014-11-10 DIAGNOSIS — E785 Hyperlipidemia, unspecified: Secondary | ICD-10-CM

## 2014-11-10 DIAGNOSIS — Z5111 Encounter for antineoplastic chemotherapy: Secondary | ICD-10-CM | POA: Insufficient documentation

## 2014-11-10 DIAGNOSIS — E786 Lipoprotein deficiency: Secondary | ICD-10-CM | POA: Diagnosis not present

## 2014-11-10 LAB — CBC WITH DIFFERENTIAL/PLATELET
BASOS ABS: 0 10*3/uL (ref 0–0.1)
BASOS PCT: 1 %
Eosinophils Absolute: 0 10*3/uL (ref 0–0.7)
Eosinophils Relative: 1 %
HEMATOCRIT: 37.5 % (ref 35.0–47.0)
HEMOGLOBIN: 12.8 g/dL (ref 12.0–16.0)
Lymphocytes Relative: 37 %
Lymphs Abs: 1.8 10*3/uL (ref 1.0–3.6)
MCH: 29.7 pg (ref 26.0–34.0)
MCHC: 34 g/dL (ref 32.0–36.0)
MCV: 87.3 fL (ref 80.0–100.0)
Monocytes Absolute: 0.2 10*3/uL (ref 0.2–0.9)
Monocytes Relative: 4 %
NEUTROS ABS: 2.8 10*3/uL (ref 1.4–6.5)
NEUTROS PCT: 57 %
Platelets: 208 10*3/uL (ref 150–440)
RBC: 4.29 MIL/uL (ref 3.80–5.20)
RDW: 19.6 % — ABNORMAL HIGH (ref 11.5–14.5)
WBC: 4.9 10*3/uL (ref 3.6–11.0)

## 2014-11-10 LAB — COMPREHENSIVE METABOLIC PANEL
ALBUMIN: 4.1 g/dL (ref 3.5–5.0)
ALK PHOS: 43 U/L (ref 38–126)
ALT: 32 U/L (ref 14–54)
AST: 37 U/L (ref 15–41)
Anion gap: 7 (ref 5–15)
BILIRUBIN TOTAL: 2.1 mg/dL — AB (ref 0.3–1.2)
BUN: 15 mg/dL (ref 6–20)
CALCIUM: 8.4 mg/dL — AB (ref 8.9–10.3)
CO2: 26 mmol/L (ref 22–32)
Chloride: 98 mmol/L — ABNORMAL LOW (ref 101–111)
Creatinine, Ser: 1.1 mg/dL — ABNORMAL HIGH (ref 0.44–1.00)
GFR calc Af Amer: >60 mL/min (ref >60)
GFR calc non Af Amer: 52 mL/min — ABNORMAL LOW (ref >60)
GLUCOSE: 138 mg/dL — AB (ref 65–99)
Potassium: 2.7 mmol/L — CL (ref 3.5–5.1)
SODIUM: 131 mmol/L — AB (ref 135–145)
TOTAL PROTEIN: 7.2 g/dL (ref 6.5–8.1)

## 2014-11-10 LAB — MAGNESIUM: Magnesium: 1.7 mg/dL (ref 1.7–2.4)

## 2014-11-10 MED ORDER — HEPARIN SOD (PORK) LOCK FLUSH 100 UNIT/ML IV SOLN
INTRAVENOUS | Status: AC
  Filled 2014-11-10: qty 5

## 2014-11-10 MED ORDER — SODIUM CHLORIDE 0.9 % IJ SOLN
10.0000 mL | INTRAMUSCULAR | Status: DC | PRN
  Administered 2014-11-10: 10 mL via INTRAVENOUS
  Filled 2014-11-10: qty 10

## 2014-11-10 MED ORDER — HEPARIN SOD (PORK) LOCK FLUSH 100 UNIT/ML IV SOLN
500.0000 [IU] | Freq: Once | INTRAVENOUS | Status: AC
  Administered 2014-11-10: 500 [IU] via INTRAVENOUS

## 2014-11-10 MED ORDER — POTASSIUM CHLORIDE 10 MEQ/100ML IV SOLN: 10.0000 meq | INTRAVENOUS | Status: DC

## 2014-11-10 MED ORDER — KLOR-CON M20 20 MEQ PO TBCR: 20.0000 meq | EXTENDED_RELEASE_TABLET | ORAL | Status: DC

## 2014-11-10 MED ORDER — SODIUM CHLORIDE 0.9 % IV SOLN
INTRAVENOUS | Status: DC
  Administered 2014-11-10: 14:00:00 via INTRAVENOUS
  Filled 2014-11-10: qty 500

## 2014-11-10 NOTE — Telephone Encounter (Signed)
Phoned patient to see if her symptoms have subsided, and if she is improving.  No answer.  Left message.  She is scheduled to see Dr, Alvia Grove at 11:00 today if not feeling better.

## 2014-11-10 NOTE — Progress Notes (Signed)
[place] @ [facility] Telephone:(336) [phone]  Fax:(336) [place] OB: 1950-06-13  MR#: [id number]  TLX#:[id number]  Patient Care Team: Jerrol Banana., MD as PCP - General (Family Medicine) Robert Bellow, MD as Consulting Physician (General Surgery) Nicholaus Bloom, MD as Consulting Physician (Plastic Surgery) Tammy Rondell Reams, [place] (Family Medicine)  CHIEF COMPLAINT:  Chief Complaint  Patient presents with  . Follow-up    Oncology History   1.  Abnormal mammogram in April of 2016 followed by ultrasound and based on clinical examination 1 cm tumor.  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  (July 05, 2014) 2.  Status post lumpectomy and sentinel lymph node evaluation tumor size is 1.4 cm sentinel lymph nodes negative.  Stage IC T1 cN0 M0 tumor 3.  Patient is going for breast reduction surgery in June of 2016 4.  Adjuvant chemotherapy started with Taxol and Herceptin on September 04, 2014     Carcinoma of left breast   07/05/2014 Initial Diagnosis Carcinoma of left breast    Oncology Flowsheet 09/25/2014 10/02/2014 10/09/2014 10/16/2014 10/23/2014 10/30/2014 11/06/2014  Day, Cycle Day 1, Cycle 2 Day 8, Cycle 2 Day 15, Cycle 2 Day 1, Cycle 3 Day 8, Cycle 3 Day 15, Cycle 3 Day 1, Cycle 4  dexamethasone (DECADRON) IV [ 10 mg ] [ 10 mg ] [ 10 mg ] [ 10 mg ] [ 10 mg ] [ 10 mg ] [ 10 mg ]  ondansetron (ZOFRAN) IV [ 8 mg ] [ 8 mg ] [ 8 mg ] [ 8 mg ] [ 8 mg ] [ 8 mg ] [ 8 mg ]  PACLitaxel (TAXOL) IV 80 mg/m2 80 mg/m2 80 mg/m2 80 mg/m2 80 mg/m2 72 mg/m2 60 mg/m2  trastuzumab (HERCEPTIN) IV - 6 mg/kg - - 6 mg/kg - -    INTERVAL HISTORY: 64 year old lady here for continuation of chemotherapy with Taxol and Herceptin 1.4 cm tumor which is HER-2 positive.  Estrogen and progesterone receptor negative. October 23, 2014 Patient is here for continuation of chemotherapy with Taxol and  Herceptin. No chills fever.  Patient continues to problem not able to sleep.  No bony pain.  No nausea or vomiting.  Intermittent tingling numbness. August, 2016 patient is here for next few cycles of chemotherapy.  Continues to have difficulty sleeping.  Patient is drinking a lot of energy drink and become somewhat jittery.  No nausea.  No vomiting.  No diarrhea.    REVIEW OF SYSTEMS:   GENERAL:  Feels good.  Active.  No fevers, sweats or weight loss.  PERFORMANCE STATUS (ECOG):  0 HEENT:  No visual changes, runny nose, sore throat, mouth sores or tenderness. Lungs: Dry hacking cough with expectoration Cardiac:  No chest pain, palpitations, orthopnea, or PND. GI:  No nausea, vomiting, complains of diarrhea this started 2 days after chemotherapy lasting for 2 days.  Still has 1 or 2 loose stool.  Did not take any Imodium.  Lower abdominal discomfort GU:  No urgency, frequency, dysuria, or hematuria. Musculoskeletal: Muscle aches and joint pains Extremities:  No pain or swelling. Skin:  Patient complains of rash that appears after chemotherapy and gradually disappears. Neuro:  No headache, numbness or weakness, balance or coordination issues. Endocrine:  No diabetes, thyroid issues, hot flashes or night sweats. Psych:  No mood changes, depression or anxiety. Pain:  No focal pain. Review of  systems:  All other systems reviewed and found to be negative. As per HPI. Otherwise, a complete review of systems is negatve.  PAST MEDICAL HISTORY: Past Medical History  Diagnosis Date  . Hypertension   . Hyperlipidemia   . GERD (gastroesophageal reflux disease)   . Depression 1995  . Cancer     skin   . 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 Complication of anesthesia 2004    oversensitive to noise, lights    PAST SURGICAL HISTORY: Past Surgical History  Procedure Laterality Date  . Back surgery    . Tonsillectomy    . Skin  cancer  removal  2015    nose   . Colonoscopy  2013    Dr. Vira Agar  . Breast lumpectomy with axillary lymph node dissection Left 08/03/2014    Procedure: BREAST LUMPECTOMY WITH AXILLARY LYMPH NODE DISSECTION;  Surgeon: Robert Bellow, MD;  Location: [facility] ORS;  Service: General;  Laterality: Left;  . Sentinel node biopsy Left 08/03/2014    Procedure: SENTINEL NODE BIOPSY;  Surgeon: Robert Bellow, MD;  Location: [facility];  Service: General;  Laterality: Left;  . Axillary lymph node biopsy Left 08/03/2014    Procedure: AXILLARY LYMPH NODE BIOPSY;  Surgeon: Robert Bellow, MD;  Location: [facility];  Service: General;  Laterality: Left;  . Breast surgery Left Aug 03, 2014    Wide excision, sentinel node biopsy.  . Portacath placement Right 08/31/2014    Procedure: INSERTION PORT-A-CATH;  Surgeon: Robert Bellow, MD;  Location: [facility];  Service: General;  Laterality: Right;    FAMILY HISTORY Family History  Problem Relation Age of Onset  . Breast cancer Mother 77       . Breast cancer Cousin 62         ADVANCED DIRECTIVES:  Patient does not have any living will or healthcare power of attorney.  Information was given .  Available resources had been discussed.  We will follow-up on subsequent appointments regarding this issue  HEALTH MAINTENANCE: Social History  Substance Use Topics  . Smoking status: Former Smoker -- 1.00 packs/day for 2 years    Types: Cigarettes    Quit date: 07/30/1976  . Smokeless tobacco: None  . Alcohol Use: 0.0 - 1.2 oz/week    0-1 Glasses of wine, 0-1 Shots of liquor, 0 Standard drinks or equivalent per week      Allergies  Allergen Reactions  . Sulfa Antibiotics Hives and Rash    Current Outpatient Prescriptions  Medication Sig Dispense Refill  . cetirizine (ZYRTEC) 10 MG tablet Take 10 mg by mouth every morning. As needed for seasonal allergies.  3  . chlorpheniramine-HYDROcodone (TUSSIONEX) 10-8 MG/5ML SUER Take 5 mLs by mouth every 12  (twelve) hours as needed for cough. 140 mL 0  . docusate sodium (COLACE) 100 MG capsule Take 100 mg by mouth daily as needed for mild constipation.    Marland Kitchen HYDROcodone-acetaminophen (NORCO) 5-325 MG per tablet Take 1-2 tablets by mouth every 4 (four) hours as needed for moderate pain or severe pain. 30 tablet 0  . KLOR-CON M20 20 MEQ tablet Take 20 mEq by mouth every morning.   4  . lidocaine-prilocaine (EMLA) cream Apply 1 application topically as needed. 30 g 3  . omeprazole (PRILOSEC) 40 MG capsule Take 40 mg by mouth every morning.   4  . ondansetron (ZOFRAN) 8 MG tablet Take 1  tablet (8 mg total) by mouth 2 (two) times daily. Start the day after chemo for 2 days. Then take as needed for nausea or vomiting. 30 tablet 1  . pravastatin (PRAVACHOL) 40 MG tablet Take 40 mg by mouth every morning.   3  . sertraline (ZOLOFT) 100 MG tablet Take 100 mg by mouth every morning.   4  . triamterene-hydrochlorothiazide (MAXZIDE) 75-50 MG per tablet Take 1 tablet by mouth every morning.   4   No current facility-administered medications for this visit.   Facility-Administered Medications Ordered in Other Visits  Medication Dose Route Frequency Provider Last Rate Last Dose  . acetaminophen (TYLENOL) tablet 650 mg  650 mg Oral Once Forest Gleason, MD      . diphenhydrAMINE (BENADRYL) capsule 50 mg  50 mg Oral Once Forest Gleason, MD      . heparin lock flush 100 unit/mL  500 Units Intravenous Once Forest Gleason, MD      . sodium chloride 0.9 % injection 10 mL  10 mL Intravenous PRN Forest Gleason, MD   10 mL at 09/25/14 1418  . sodium chloride 0.9 % injection 10 mL  10 mL Intracatheter PRN Forest Gleason, MD      . sodium chloride 0.9 % injection 10 mL  10 mL Intracatheter PRN Forest Gleason, MD   10 mL at 10/02/14 0900  . sodium chloride 0.9 % injection 10 mL  10 mL Intravenous PRN Forest Gleason, MD        OBJECTIVE:  Filed Vitals:   11/06/14 1002  BP: 144/82  Pulse: 108  Temp: 96.4 F (35.8 C)     Body mass  index is 36.84 kg/(m^2).    ECOG FS:0 - Asymptomatic  PHYSICAL EXAM: General  status: Performance status is good.  Patient has not lost significant weight HEENT: No evidence of stomatitis. Sclera and conjunctivae :: No jaundice.   pale looking. Lungs: Air  entry equal on both sides.  No rhonchi.  No rales.  Cardiac: Heart sounds are normal.  No pericardial rub.  No murmur. Lymphatic system: Cervical, axillary, inguinal, lymph nodes not palpable GI: Abdomen is soft.  No ascites.  Liver spleen not palpable.  No tenderness.  Bowel sounds are within normal limit Lower extremity: No edema Neurological system: Higher functions, cranial nerves intact no evidence of peripheral neuropathy. Skin: No rash.  No ecchymosis.. Bilateral breast mammoplasty  Port site within normal limit healing well. Alopecia. No stomatitis   LAB RESULTS:  Infusion on 11/06/2014  Component Date Value Ref Range Status  . WBC 11/06/2014 6.3  3.6 - 11.0 K/uL Final   A-LINE DRAW  . RBC 11/06/2014 4.35  3.80 - 5.20 MIL/uL Final  . Hemoglobin 11/06/2014 12.9  12.0 - 16.0 g/dL Final  . HCT 11/06/2014 37.9  35.0 - 47.0 % Final  . MCV 11/06/2014 87.1  80.0 - 100.0 fL Final  . MCH 11/06/2014 29.6  26.0 - 34.0 pg Final  . MCHC 11/06/2014 34.0  32.0 - 36.0 g/dL Final  . RDW 11/06/2014 19.4* 11.5 - 14.5 % Final  . Platelets 11/06/2014 217  150 - 440 K/uL Final  . Neutrophils Relative % 11/06/2014 47   Final  . Neutro Abs 11/06/2014 2.9  1.4 - 6.5 K/uL Final  . Lymphocytes Relative 11/06/2014 43   Final  . Lymphs Abs 11/06/2014 2.7  1.0 - 3.6 K/uL Final  . Monocytes Relative 11/06/2014 8   Final  . Monocytes Absolute 11/06/2014 0.5  0.2 -  0.9 K/uL Final  . Eosinophils Relative 11/06/2014 1   Final  . Eosinophils Absolute 11/06/2014 0.1  0 - 0.7 K/uL Final  . Basophils Relative 11/06/2014 1   Final  . Basophils Absolute 11/06/2014 0.1  0 - 0.1 K/uL Final  . Sodium 11/06/2014 138  135 - 145 mmol/L Final  . Potassium  11/06/2014 2.9* 3.5 - 5.1 mmol/L Final   Comment: RESULT REPEATED AND VERIFIED CRITICAL RESULT CALLED TO, READ BACK BY AND VERIFIED WITH: VICKI DAVIS AT 1040 11/06/2014 BY KMR   . Chloride 11/06/2014 102  101 - 111 mmol/L Final  . CO2 11/06/2014 26  22 - 32 mmol/L Final  . Glucose, Bld 11/06/2014 123* 65 - 99 mg/dL Final  . BUN 11/06/2014 20  6 - 20 mg/dL Final  . Creatinine, Ser 11/06/2014 1.19* 0.44 - 1.00 mg/dL Final  . Calcium 11/06/2014 8.9  8.9 - 10.3 mg/dL Final  . Total Protein 11/06/2014 7.4  6.5 - 8.1 g/dL Final  . Albumin 11/06/2014 4.2  3.5 - 5.0 g/dL Final  . AST 11/06/2014 36  15 - 41 U/L Final  . ALT 11/06/2014 35  14 - 54 U/L Final  . Alkaline Phosphatase 11/06/2014 45  38 - 126 U/L Final  . Total Bilirubin 11/06/2014 1.2  0.3 - 1.2 mg/dL Final  . GFR calc non Af Amer 11/06/2014 48* >60 mL/min Final  . GFR calc Af Amer 11/06/2014 55* >60 mL/min Final   Comment: (NOTE) The eGFR has been calculated using the CKD EPI equation. This calculation has not been validated in all clinical situations. eGFR's persistently <60 mL/min signify possible Chronic Kidney Disease.   . Anion gap 11/06/2014 10  5 - 15 Final  . Magnesium 11/06/2014 1.7  1.7 - 2.4 mg/dL Final      STUDIES: No results found.  ASSESSMENT: Carcinoma of breast status post lumpectomy and sentinel lymph node (left breast) stage IC Estrogen and progesterone receptor negative HER-2/neu positive tumor Status post mammoplasty on both breast Lab data has been reviewed. Therapy with Taxol and Herceptin will be continued  MEDICAL DECISION MAKING:   Will continue chemotherapy.    Hypokalemia  Increase by mouth potassium.  We will give an additional 20 mEq of potassium  r   Patient expressed understanding and was in agreement with this plan. She also understands that She can call clinic at any time with any questions, concerns, or complaints.    Carcinoma of left breast   Staging form: Breast, AJCC 7th  Edition     Clinical: Stage IA (T1c, N0, M0) - Marni Griffon, MD   11/10/2014 11:09 AM  [facility] [place] [phone]  Fax:(336) [phone]     ARR[place] MCNAUGHT OB: 1950-11-02  MR#: [id number]  VZD#:[id number]  Patient Care Team: Jerrol Banana., MD as PCP - General (Family Medicine) Robert Bellow, MD as Consulting Physician (General Surgery) Nicholaus Bloom, MD as Consulting Physician (Plastic Surgery) Tammy Rondell Reams, [place] (Family Medicine)  CHIEF COMPLAINT:  Chief Complaint  Patient presents with  . Follow-up    Oncology History   1.  Abnormal mammogram in April of 2016 followed by ultrasound and based on clinical examination 1 cm tumor.  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  (July 05, 2014) 2.  Status post lumpectomy and sentinel lymph node evaluation tumor size is 1.4 cm sentinel lymph nodes negative.  Stage IC T1 cN0 M0 tumor 3.  Patient is going for breast reduction surgery in June of 2016 4.  Adjuvant chemotherapy started with Taxol and Herceptin on September 04, 2014     Carcinoma of left breast   07/05/2014 Initial Diagnosis Carcinoma of left breast    Oncology Flowsheet 09/25/2014 10/02/2014 10/09/2014 10/16/2014 10/23/2014 10/30/2014 11/06/2014  Day, Cycle Day 1, Cycle 2 Day 8, Cycle 2 Day 15, Cycle 2 Day 1, Cycle 3 Day 8, Cycle 3 Day 15, Cycle 3 Day 1, Cycle 4  dexamethasone (DECADRON) IV [ 10 mg ] [ 10 mg ] [ 10 mg ] [ 10 mg ] [ 10 mg ] [ 10 mg ] [ 10 mg ]  ondansetron (ZOFRAN) IV [ 8 mg ] [ 8 mg ] [ 8 mg ] [ 8 mg ] [ 8 mg ] [ 8 mg ] [ 8 mg ]  PACLitaxel (TAXOL) IV 80 mg/m2 80 mg/m2 80 mg/m2 80 mg/m2 80 mg/m2 72 mg/m2 60 mg/m2  trastuzumab (HERCEPTIN) IV - 6 mg/kg - - 6 mg/kg - -    INTERVAL HISTORY: 64 year old lady here for continuation of chemotherapy with Taxol and Herceptin 1.4 cm tumor which is HER-2 positive.  Estrogen and progesterone  receptor negative.  REVIEW OF SYSTEMS:   GENERAL:  Feels good.  Active.  No fevers, sweats or weight loss.  PERFORMANCE STATUS (ECOG):  0 HEENT:  No visual changes, runny nose, sore throat, mouth sores or tenderness. Lungs: Dry hacking cough with expectoration Cardiac:  No chest pain, palpitations, orthopnea, or PND. GI:  No nausea, vomiting, complains of diarrhea this started 2 days after chemotherapy lasting for 2 days.  Still has 1 or 2 loose stool.  Did not take any Imodium.  Lower abdominal discomfort GU:  No urgency, frequency, dysuria, or hematuria. Musculoskeletal: Muscle aches and joint pains Extremities:  No pain or swelling. Skin:  Patient complains of rash that appears after chemotherapy and gradually disappears. Neuro:  No headache, numbness or weakness, balance or coordination issues. Endocrine:  No diabetes, thyroid issues, hot flashes or night sweats. Psych:  No mood changes, depression or anxiety. Pain:  No focal pain. Review of systems:  All other systems reviewed and found to be negative. As per HPI. Otherwise, a complete review of systems is negatve.  PAST MEDICAL HISTORY: Past Medical History  Diagnosis Date  . Hypertension   . Hyperlipidemia   . GERD (gastroesophageal reflux disease)   . Depression 1995  . Cancer     skin   . 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 Complication of anesthesia 2004    oversensitive to noise, lights    PAST SURGICAL HISTORY: Past Surgical History  Procedure Laterality Date  . Back surgery    . Tonsillectomy    . Skin cancer  removal  2015    nose   . Colonoscopy  2013    Dr. Vira Agar  . Breast lumpectomy with axillary lymph node dissection Left 08/03/2014    Procedure: BREAST LUMPECTOMY WITH AXILLARY LYMPH NODE DISSECTION;  Surgeon: Robert Bellow, MD;  Location: [facility];  Service: General;  Laterality: Left;  . Sentinel node biopsy Left 08/03/2014     Procedure: SENTINEL NODE BIOPSY;  Surgeon: Robert Bellow, MD;  Location: [facility];  Service: General;  Laterality: Left;  . Axillary lymph node biopsy Left 08/03/2014    Procedure: AXILLARY LYMPH NODE  BIOPSY;  Surgeon: Robert Bellow, MD;  Location: [facility];  Service: General;  Laterality: Left;  . Breast surgery Left Aug 03, 2014    Wide excision, sentinel node biopsy.  . Portacath placement Right 08/31/2014    Procedure: INSERTION PORT-A-CATH;  Surgeon: Robert Bellow, MD;  Location: [facility];  Service: General;  Laterality: Right;    FAMILY HISTORY Family History  Problem Relation Age of Onset  . Breast cancer Mother 44       . Breast cancer Cousin 62         ADVANCED DIRECTIVES:  Patient does not have any living will or healthcare power of attorney.  Information was given .  Available resources had been discussed.  We will follow-up on subsequent appointments regarding this issue  HEALTH MAINTENANCE: Social History  Substance Use Topics  . Smoking status: Former Smoker -- 1.00 packs/day for 2 years    Types: Cigarettes    Quit date: 07/30/1976  . Smokeless tobacco: None  . Alcohol Use: 0.0 - 1.2 oz/week    0-1 Glasses of wine, 0-1 Shots of liquor, 0 Standard drinks or equivalent per week      Allergies  Allergen Reactions  . Sulfa Antibiotics Hives and Rash    Current Outpatient Prescriptions  Medication Sig Dispense Refill  . cetirizine (ZYRTEC) 10 MG tablet Take 10 mg by mouth every morning. As needed for seasonal allergies.  3  . chlorpheniramine-HYDROcodone (TUSSIONEX) 10-8 MG/5ML SUER Take 5 mLs by mouth every 12 (twelve) hours as needed for cough. 140 mL 0  . docusate sodium (COLACE) 100 MG capsule Take 100 mg by mouth daily as needed for mild constipation.    Marland Kitchen HYDROcodone-acetaminophen (NORCO) 5-325 MG per tablet Take 1-2 tablets by mouth every 4 (four) hours as needed for moderate pain or severe pain. 30 tablet 0  . KLOR-CON M20 20 MEQ tablet Take 20  mEq by mouth every morning.   4  . lidocaine-prilocaine (EMLA) cream Apply 1 application topically as needed. 30 g 3  . omeprazole (PRILOSEC) 40 MG capsule Take 40 mg by mouth every morning.   4  . ondansetron (ZOFRAN) 8 MG tablet Take 1 tablet (8 mg total) by mouth 2 (two) times daily. Start the day after chemo for 2 days. Then take as needed for nausea or vomiting. 30 tablet 1  . pravastatin (PRAVACHOL) 40 MG tablet Take 40 mg by mouth every morning.   3  . sertraline (ZOLOFT) 100 MG tablet Take 100 mg by mouth every morning.   4  . triamterene-hydrochlorothiazide (MAXZIDE) 75-50 MG per tablet Take 1 tablet by mouth every morning.   4   No current facility-administered medications for this visit.   Facility-Administered Medications Ordered in Other Visits  Medication Dose Route Frequency Provider Last Rate Last Dose  . acetaminophen (TYLENOL) tablet 650 mg  650 mg Oral Once Forest Gleason, MD      . diphenhydrAMINE (BENADRYL) capsule 50 mg  50 mg Oral Once Forest Gleason, MD      . heparin lock flush 100 unit/mL  500 Units Intravenous Once Forest Gleason, MD      . sodium chloride 0.9 % injection 10 mL  10 mL Intravenous PRN Forest Gleason, MD   10 mL at 09/25/14 1418  . sodium chloride 0.9 % injection 10 mL  10 mL Intracatheter PRN Forest Gleason, MD      . sodium chloride 0.9 % injection 10 mL  10 mL Intracatheter PRN  Forest Gleason, MD   10 mL at 10/02/14 0900  . sodium chloride 0.9 % injection 10 mL  10 mL Intravenous PRN Forest Gleason, MD        OBJECTIVE:  Filed Vitals:   11/06/14 1002  BP: 144/82  Pulse: 108  Temp: 96.4 F (35.8 C)     Body mass index is 36.84 kg/(m^2).    ECOG FS:0 - Asymptomatic  PHYSICAL EXAM: General  status: Performance status is good.  Patient has not lost significant weight HEENT: No evidence of stomatitis. Sclera and conjunctivae :: No jaundice.   pale looking. Lungs: Air  entry equal on both sides.  No rhonchi.  No rales.  Cardiac: Heart sounds are normal.  No  pericardial rub.  No murmur. Lymphatic system: Cervical, axillary, inguinal, lymph nodes not palpable GI: Abdomen is soft.  No ascites.  Liver spleen not palpable.  No tenderness.  Bowel sounds are within normal limit Lower extremity: No edema Neurological system: Higher functions, cranial nerves intact no evidence of peripheral neuropathy. Skin: No rash.  No ecchymosis.. Bilateral breast mammoplasty  Port site within normal limit healing well. Alopecia. No stomatitis   LAB RESULTS:  Infusion on 11/06/2014  Component Date Value Ref Range Status  . WBC 11/06/2014 6.3  3.6 - 11.0 K/uL Final   A-LINE DRAW  . RBC 11/06/2014 4.35  3.80 - 5.20 MIL/uL Final  . Hemoglobin 11/06/2014 12.9  12.0 - 16.0 g/dL Final  . HCT 11/06/2014 37.9  35.0 - 47.0 % Final  . MCV 11/06/2014 87.1  80.0 - 100.0 fL Final  . MCH 11/06/2014 29.6  26.0 - 34.0 pg Final  . MCHC 11/06/2014 34.0  32.0 - 36.0 g/dL Final  . RDW 11/06/2014 19.4* 11.5 - 14.5 % Final  . Platelets 11/06/2014 217  150 - 440 K/uL Final  . Neutrophils Relative % 11/06/2014 47   Final  . Neutro Abs 11/06/2014 2.9  1.4 - 6.5 K/uL Final  . Lymphocytes Relative 11/06/2014 43   Final  . Lymphs Abs 11/06/2014 2.7  1.0 - 3.6 K/uL Final  . Monocytes Relative 11/06/2014 8   Final  . Monocytes Absolute 11/06/2014 0.5  0.2 - 0.9 K/uL Final  . Eosinophils Relative 11/06/2014 1   Final  . Eosinophils Absolute 11/06/2014 0.1  0 - 0.7 K/uL Final  . Basophils Relative 11/06/2014 1   Final  . Basophils Absolute 11/06/2014 0.1  0 - 0.1 K/uL Final  . Sodium 11/06/2014 138  135 - 145 mmol/L Final  . Potassium 11/06/2014 2.9* 3.5 - 5.1 mmol/L Final   Comment: RESULT REPEATED AND VERIFIED CRITICAL RESULT CALLED TO, READ BACK BY AND VERIFIED WITH: VICKI DAVIS AT 0737 11/06/2014 BY KMR   . Chloride 11/06/2014 102  101 - 111 mmol/L Final  . CO2 11/06/2014 26  22 - 32 mmol/L Final  . Glucose, Bld 11/06/2014 123* 65 - 99 mg/dL Final  . BUN 11/06/2014 20  6 - 20  mg/dL Final  . Creatinine, Ser 11/06/2014 1.19* 0.44 - 1.00 mg/dL Final  . Calcium 11/06/2014 8.9  8.9 - 10.3 mg/dL Final  . Total Protein 11/06/2014 7.4  6.5 - 8.1 g/dL Final  . Albumin 11/06/2014 4.2  3.5 - 5.0 g/dL Final  . AST 11/06/2014 36  15 - 41 U/L Final  . ALT 11/06/2014 35  14 - 54 U/L Final  . Alkaline Phosphatase 11/06/2014 45  38 - 126 U/L Final  . Total Bilirubin 11/06/2014 1.2  0.3 -  1.2 mg/dL Final  . GFR calc non Af Amer 11/06/2014 48* >60 mL/min Final  . GFR calc Af Amer 11/06/2014 55* >60 mL/min Final   Comment: (NOTE) The eGFR has been calculated using the CKD EPI equation. This calculation has not been validated in all clinical situations. eGFR's persistently <60 mL/min signify possible Chronic Kidney Disease.   . Anion gap 11/06/2014 10  5 - 15 Final  . Magnesium 11/06/2014 1.7  1.7 - 2.4 mg/dL Final      STUDIES: No results found.  ASSESSMENT: Carcinoma of breast status post lumpectomy and sentinel lymph node (left breast) stage IC Estrogen and progesterone receptor negative HER-2/neu positive tumor Status post mammoplasty on both breast Lab data has been reviewed. Therapy with Taxol and Herceptin will be continued  MEDICAL DECISION MAKING:   All lab data has been reviewed Diarrhea has resolved but patient has no muscle aches   Patient expressed understanding and was in agreement with this plan. She also understands that She can call clinic at any time with any questions, concerns, or complaints.    Carcinoma of left breast   Staging form: Breast, AJCC 7th Edition     Clinical: Stage IA (T1c, N0, M0) - Marni Griffon, MD   11/10/2014 11:09 AM

## 2014-11-10 NOTE — Progress Notes (Signed)
Patient acute add on for severe diarrhea since Wednesday.  States she has had one BM this morning but still diarrhea.  States she has to go to bathroom as soon as she eats.  Also c/o neuropathy in her feet.

## 2014-11-10 NOTE — Telephone Encounter (Signed)
Critical K+ - 2.7  MD notified. 

## 2014-11-10 NOTE — Progress Notes (Signed)
  Oncology Nurse Navigator Documentation    Navigator Encounter Type: Treatment (11/10/14 1400) Patient Visit Type: Medonc (11/10/14 1400) Treatment Phase: Other (Hypokalemia ) (11/10/14 1400) Barriers/Navigation Needs: Education (11/10/14 1400)   Oncology Nurse Navigator Documentation    Navigator Encounter Type: Treatment (11/10/14 1400) Patient Visit Type: Medonc (11/10/14 1400) Treatment Phase: Other (Hypokalemia; radiation) (11/10/14 1400) Barriers/Navigation Needs: Education (11/10/14 1400) Education: Understanding Cancer/ Treatment Options;Pain/ Symptom Management (11/10/14 1400) Interventions: Coordination of Care;Education Method (11/10/14 1400)   Coordination of Care: MD Appointments (11/10/14 1400) Education Method: Teach-back;Verbal (11/10/14 1400)      Time Spent with Patient: 60 (11/10/14 1400)   Interventions: Coordination of Care;Education Method (11/10/14 1400)   Coordination of Care: MD Appointments (11/10/14 1400) Education Method: Teach-back;Verbal (11/10/14 1400)    Patient states she has received 10 of her planned Taxol treatments, and may not receive final two.  Requests if not, could her consult with Radiation Oncology be scheduled at same time as her next appointment with Dr. Oliva Bustard.  In Basket message sent to [facility] RN for this request.      Time Spent with Patient: 60 (11/10/14 1400)

## 2014-11-13 ENCOUNTER — Encounter: Payer: Self-pay | Admitting: Hematology and Oncology

## 2014-11-13 NOTE — Progress Notes (Signed)
[facility] day:  11/10/2014  Chief Complaint: Madison Christensen is a 64 y.o. female with stage IC Her2/neu + breast cancer who is seen for sick call visit secondary to diarrhea.  HPI: The patient was last seen in the medical oncology clinic on 11/06/2014 By Dr Oliva Bustard.  At that time, she received week #10 Taxol.  She states that the diarrhea started that evening with increasing diarrhea the next day. Symptoms are associated with some abdominal cramping. She states that typically Saturday or Sunday after chemotherapy she feels better.  She has had diarrhea associated with chemotherapy since the beginning of treatment. She has "dealt with it". She takes Imodium and Metamucil. Eating makes her diarrhea worse.  Yesterday she had 9 episodes of diarrhea.  She is maintaining her hydration.  She has been on potassium supplementation. On Monday 11/06/2014, she was taking one potassium pill a day. Since that appointment, she was told to increase her potassium to 2 pills a day.  She is running out of her prescription.  She states that her appetite has decreased. Food does not taste good. She notes mouth sensitivity.  She notes that there is no further plan for additional Taxol. Her fingers and toes are numb. Her neuropathy affects her ability to his zip zippers, use an electro toothbrush, and open bottles.  Past Medical History  Diagnosis Date  . Hypertension   . Hyperlipidemia   . GERD (gastroesophageal reflux disease)   . Depression 1995  . Cancer     skin   . 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 Complication of anesthesia 2004    oversensitive to noise, lights    Past Surgical History  Procedure Laterality Date  . Back surgery    . Tonsillectomy    . Skin cancer  removal  2015    nose   . Colonoscopy  2013    Dr. Vira Agar  . Breast lumpectomy with axillary lymph node dissection Left  08/03/2014    Procedure: BREAST LUMPECTOMY WITH AXILLARY LYMPH NODE DISSECTION;  Surgeon: Robert Bellow, MD;  Location: [facility] ORS;  Service: General;  Laterality: Left;  . Sentinel node biopsy Left 08/03/2014    Procedure: SENTINEL NODE BIOPSY;  Surgeon: Robert Bellow, MD;  Location: [facility] ORS;  Service: General;  Laterality: Left;  . Axillary lymph node biopsy Left 08/03/2014    Procedure: AXILLARY LYMPH NODE BIOPSY;  Surgeon: Robert Bellow, MD;  Location: [facility] ORS;  Service: General;  Laterality: Left;  . Breast surgery Left Aug 03, 2014    Wide excision, sentinel node biopsy.  . Portacath placement Right 08/31/2014    Procedure: INSERTION PORT-A-CATH;  Surgeon: Robert Bellow, MD;  Location: [facility] ORS;  Service: General;  Laterality: Right;    Family History  Problem Relation Age of Onset  . Breast cancer Mother 49       . Breast cancer Cousin 62         Social History:  reports that she quit smoking about 38 years ago. Her smoking use included Cigarettes. She has a 2 pack-year smoking history. She does not have any smokeless tobacco history on file. She reports that she drinks alcohol. She reports that she does not use illicit drugs.  The patient is accompanied by her sister, Madison Christensen, today.  Allergies:  Allergies  Allergen Reactions  . Sulfa Antibiotics Hives and Rash  Current Medications: Current Outpatient Prescriptions  Medication Sig Dispense Refill  . cetirizine (ZYRTEC) 10 MG tablet Take 10 mg by mouth every morning. As needed for seasonal allergies.  3  . chlorpheniramine-HYDROcodone (TUSSIONEX) 10-8 MG/5ML SUER Take 5 mLs by mouth every 12 (twelve) hours as needed for cough. 140 mL 0  . docusate sodium (COLACE) 100 MG capsule Take 100 mg by mouth daily as needed for mild constipation.    Marland Kitchen HYDROcodone-acetaminophen (NORCO) 5-325 MG per tablet Take 1-2 tablets by mouth every 4 (four) hours as needed for moderate pain or severe pain. 30 tablet 0  . KLOR-CON M20  20 MEQ tablet Take 1 tablet (20 mEq total) by mouth every morning. 30 tablet 0  . lidocaine-prilocaine (EMLA) cream Apply 1 application topically as needed. 30 g 3  . omeprazole (PRILOSEC) 40 MG capsule Take 40 mg by mouth every morning.   4  . ondansetron (ZOFRAN) 8 MG tablet Take 1 tablet (8 mg total) by mouth 2 (two) times daily. Start the day after chemo for 2 days. Then take as needed for nausea or vomiting. 30 tablet 1  . pravastatin (PRAVACHOL) 40 MG tablet Take 40 mg by mouth every morning.   3  . sertraline (ZOLOFT) 100 MG tablet Take 100 mg by mouth every morning.   4  . triamterene-hydrochlorothiazide (MAXZIDE) 75-50 MG per tablet Take 1 tablet by mouth every morning.   4   Current Facility-Administered Medications  Medication Dose Route Frequency Provider Last Rate Last Dose  . sodium chloride 0.9 % 500 mL with potassium chloride 20 mEq infusion   Intravenous Continuous Madison Asal, MD   Stopped at 11/10/14 1453   Facility-Administered Medications Ordered in Other Visits  Medication Dose Route Frequency Provider Last Rate Last Dose  . acetaminophen (TYLENOL) tablet 650 mg  650 mg Oral Once Forest Gleason, MD      . diphenhydrAMINE (BENADRYL) capsule 50 mg  50 mg Oral Once Forest Gleason, MD      . sodium chloride 0.9 % injection 10 mL  10 mL Intravenous PRN Forest Gleason, MD   10 mL at 09/25/14 1418  . sodium chloride 0.9 % injection 10 mL  10 mL Intracatheter PRN Forest Gleason, MD      . sodium chloride 0.9 % injection 10 mL  10 mL Intracatheter PRN Forest Gleason, MD   10 mL at 10/02/14 0900    Review of Systems:  GENERAL:  Feels fatigued. No fevers or sweats.  Weight stable. PERFORMANCE STATUS (ECOG): 1 HEENT:  No visual changes, runny nose, sore throat, mouth sores or tenderness. Lungs: No shortness of breath or cough.  No hemoptysis. Cardiac:  No chest pain, palpitations, orthopnea, or PND. GI:  Poor appetite.  Diarrhea.  No nausea, vomiting, constipation, melena or  hematochezia. GU:  No urgency, frequency, dysuria, or hematuria. Musculoskeletal:  No back pain.  No joint pain.  No muscle tenderness. Extremities:  No pain or swelling. Skin:  No rashes or skin changes. Neuro:  Neuropathy in fingers and toes.  No headache, weakness, balance or coordination issues. Endocrine:  No diabetes, thyroid issues, hot flashes or night sweats. Psych:  No mood changes, depression or anxiety. Pain:  No focal pain. Review of systems:  All other systems reviewed and found to be negative.  Physical Exam: Blood pressure 135/82, pulse 103, temperature 96.8 F (36 C), temperature source Tympanic, weight 222 lb 3.6 oz (100.8 kg). GENERAL:  Well developed, well nourished, sitting comfortably in  the exam room in no acute distress. MENTAL STATUS:  Alert and oriented to person, place and time. HEAD:  Wearing a scarf.  Silver hair.  Normocephalic, atraumatic, face symmetric, no Cushingoid features. EYES:  Glasses.  Blue eyes.  Pupils equal round and reactive to light and accomodation.  No conjunctivitis or scleral icterus. ENT:  Oropharynx clear without lesion.  Tongue normal. Mucous membranes moist.  RESPIRATORY:  Clear to auscultation without rales, wheezes or rhonchi. CARDIOVASCULAR:  Regular rate and rhythm without murmur, rub or gallop. ABDOMEN:  Soft, non-tender, with active bowel sounds, and no hepatosplenomegaly.  No masses. SKIN:  No rashes, ulcers or lesions. EXTREMITIES: No edema, no skin discoloration or tenderness.  No palpable cords. LYMPH NODES: No palpable cervical, supraclavicular, axillary or inguinal adenopathy  PSYCH:  Appropriate.  Infusion on 11/10/2014  Component Date Value Ref Range Status  . WBC 11/10/2014 4.9  3.6 - 11.0 K/uL Final   A-LINE DRAW  . RBC 11/10/2014 4.29  3.80 - 5.20 MIL/uL Final  . Hemoglobin 11/10/2014 12.8  12.0 - 16.0 g/dL Final  . HCT 11/10/2014 37.5  35.0 - 47.0 % Final  . MCV 11/10/2014 87.3  80.0 - 100.0 fL Final  . MCH  11/10/2014 29.7  26.0 - 34.0 pg Final  . MCHC 11/10/2014 34.0  32.0 - 36.0 g/dL Final  . RDW 11/10/2014 19.6* 11.5 - 14.5 % Final  . Platelets 11/10/2014 208  150 - 440 K/uL Final  . Neutrophils Relative % 11/10/2014 57   Final  . Neutro Abs 11/10/2014 2.8  1.4 - 6.5 K/uL Final  . Lymphocytes Relative 11/10/2014 37   Final  . Lymphs Abs 11/10/2014 1.8  1.0 - 3.6 K/uL Final  . Monocytes Relative 11/10/2014 4   Final  . Monocytes Absolute 11/10/2014 0.2  0.2 - 0.9 K/uL Final  . Eosinophils Relative 11/10/2014 1   Final  . Eosinophils Absolute 11/10/2014 0.0  0 - 0.7 K/uL Final  . Basophils Relative 11/10/2014 1   Final  . Basophils Absolute 11/10/2014 0.0  0 - 0.1 K/uL Final  . Sodium 11/10/2014 131* 135 - 145 mmol/L Final  . Potassium 11/10/2014 2.7* 3.5 - 5.1 mmol/L Final   Comment: RESULTS VERIFIED BY REPEAT TESTING CRITICAL RESULT CALLED TO, READ BACK BY AND VERIFIED WITH nyo to anita black 1239 11/10/14   . Chloride 11/10/2014 98* 101 - 111 mmol/L Final  . CO2 11/10/2014 26  22 - 32 mmol/L Final  . Glucose, Bld 11/10/2014 138* 65 - 99 mg/dL Final  . BUN 11/10/2014 15  6 - 20 mg/dL Final  . Creatinine, Ser 11/10/2014 1.10* 0.44 - 1.00 mg/dL Final  . Calcium 11/10/2014 8.4* 8.9 - 10.3 mg/dL Final  . Total Protein 11/10/2014 7.2  6.5 - 8.1 g/dL Final  . Albumin 11/10/2014 4.1  3.5 - 5.0 g/dL Final  . AST 11/10/2014 37  15 - 41 U/L Final  . ALT 11/10/2014 32  14 - 54 U/L Final  . Alkaline Phosphatase 11/10/2014 43  38 - 126 U/L Final  . Total Bilirubin 11/10/2014 2.1* 0.3 - 1.2 mg/dL Final  . GFR calc non Af Amer 11/10/2014 52* >60 mL/min Final  . GFR calc Af Amer 11/10/2014 >60  >60 mL/min Final   Comment: (NOTE) The eGFR has been calculated using the CKD EPI equation. This calculation has not been validated in all clinical situations. eGFR's persistently <60 mL/min signify possible Chronic Kidney Disease.   . Anion gap 11/10/2014 7  5 -  15 Final  . Magnesium 11/10/2014 1.7   1.7 - 2.4 mg/dL Final    Assessment:  KAYELYN LEMON is a 64 y.o. female with stage IC Her2/neu + breast cancer status post 10 weeks of Taxol (last 11/06/2014).  She receive Herceptin every 3 weeks (last  10/23/2014).  Chemotherapy has been complicated by diarrhea.  She is well hydrated.  She has hypokalemia on potassium supplimentation.  Symptomatically, she is fatigued.  Exam is unremarkable.  Plan: 1. Labs today:  CBC with diff, CMP. 2. NS 500 cc with 20 meq KCL. 3. Refill potassium 20 meq.  Continue 1 tablet BID x 3 days then decrease to 1 pill a day if diarrhea resolved. 4. Stool studies for culture, O&P, C difficile toxin. 5. RTC as previously scheduled.   Madison Asal, MD  11/10/2014

## 2014-11-14 ENCOUNTER — Telehealth: Payer: Self-pay | Admitting: *Deleted

## 2014-11-14 DIAGNOSIS — E876 Hypokalemia: Secondary | ICD-10-CM

## 2014-11-14 DIAGNOSIS — Z5189 Encounter for other specified aftercare: Secondary | ICD-10-CM

## 2014-11-14 NOTE — Telephone Encounter (Signed)
Patient instructed that md wants to see her and to be her 8 - 815. She is in agreement with this

## 2014-11-14 NOTE — Telephone Encounter (Signed)
Patient reports that she continues to have diarrhea , but it has slightly improved. Having numbness in fingers. Her next lab/ inf is 9/7 and her next fu appt 9/26. Dr Mike Gip wanted Dr Metro Kung input on how long to have her take potassium twice a day

## 2014-11-14 NOTE — Addendum Note (Signed)
Addended by: Betti Cruz on: 11/14/2014 12:06 PM   Modules accepted: Orders

## 2014-11-14 NOTE — Telephone Encounter (Signed)
Called patient per V/O of Dr. Mike Gip.  Patient was seen by Dr. Mike Gip on Friday for severe diarrhea and neuropathy. Patient states she is much better.  She still is having some diarrhea but nothing like it was.  The neuropathy is no better.  She does states she has some swelling in her legs.  Patient has appointment with Dr. Oliva Bustard for tomorrow.  Dr. Mike Gip informed of this conversation with patient.

## 2014-11-15 ENCOUNTER — Encounter: Payer: Self-pay | Admitting: Oncology

## 2014-11-15 ENCOUNTER — Inpatient Hospital Stay (HOSPITAL_BASED_OUTPATIENT_CLINIC_OR_DEPARTMENT_OTHER): Payer: BLUE CROSS/BLUE SHIELD | Admitting: Oncology

## 2014-11-15 ENCOUNTER — Inpatient Hospital Stay: Payer: BLUE CROSS/BLUE SHIELD

## 2014-11-15 VITALS — BP 136/84 | HR 106 | Temp 97.3°F | Wt 221.2 lb

## 2014-11-15 DIAGNOSIS — Z79899 Other long term (current) drug therapy: Secondary | ICD-10-CM

## 2014-11-15 DIAGNOSIS — E876 Hypokalemia: Secondary | ICD-10-CM

## 2014-11-15 DIAGNOSIS — R5383 Other fatigue: Secondary | ICD-10-CM

## 2014-11-15 DIAGNOSIS — R197 Diarrhea, unspecified: Secondary | ICD-10-CM | POA: Diagnosis not present

## 2014-11-15 DIAGNOSIS — Z5189 Encounter for other specified aftercare: Secondary | ICD-10-CM

## 2014-11-15 DIAGNOSIS — Z171 Estrogen receptor negative status [ER-]: Secondary | ICD-10-CM

## 2014-11-15 DIAGNOSIS — C50912 Malignant neoplasm of unspecified site of left female breast: Secondary | ICD-10-CM

## 2014-11-15 DIAGNOSIS — Z87891 Personal history of nicotine dependence: Secondary | ICD-10-CM

## 2014-11-15 DIAGNOSIS — R630 Anorexia: Secondary | ICD-10-CM

## 2014-11-15 DIAGNOSIS — F329 Major depressive disorder, single episode, unspecified: Secondary | ICD-10-CM

## 2014-11-15 DIAGNOSIS — G629 Polyneuropathy, unspecified: Secondary | ICD-10-CM

## 2014-11-15 DIAGNOSIS — I10 Essential (primary) hypertension: Secondary | ICD-10-CM

## 2014-11-15 DIAGNOSIS — K219 Gastro-esophageal reflux disease without esophagitis: Secondary | ICD-10-CM

## 2014-11-15 DIAGNOSIS — E785 Hyperlipidemia, unspecified: Secondary | ICD-10-CM

## 2014-11-15 DIAGNOSIS — Z85828 Personal history of other malignant neoplasm of skin: Secondary | ICD-10-CM

## 2014-11-15 LAB — CBC WITH DIFFERENTIAL/PLATELET
BASOS ABS: 0.1 10*3/uL (ref 0–0.1)
Basophils Relative: 1 %
EOS PCT: 1 %
Eosinophils Absolute: 0.1 10*3/uL (ref 0–0.7)
HEMATOCRIT: 37.8 % (ref 35.0–47.0)
Hemoglobin: 12.6 g/dL (ref 12.0–16.0)
LYMPHS ABS: 2.4 10*3/uL (ref 1.0–3.6)
LYMPHS PCT: 40 %
MCH: 29.7 pg (ref 26.0–34.0)
MCHC: 33.4 g/dL (ref 32.0–36.0)
MCV: 88.8 fL (ref 80.0–100.0)
MONO ABS: 0.6 10*3/uL (ref 0.2–0.9)
Monocytes Relative: 10 %
NEUTROS ABS: 3 10*3/uL (ref 1.4–6.5)
Neutrophils Relative %: 48 %
PLATELETS: 218 10*3/uL (ref 150–440)
RBC: 4.25 MIL/uL (ref 3.80–5.20)
RDW: 20.3 % — AB (ref 11.5–14.5)
WBC: 6.2 10*3/uL (ref 3.6–11.0)

## 2014-11-15 LAB — COMPREHENSIVE METABOLIC PANEL
ALBUMIN: 4.1 g/dL (ref 3.5–5.0)
ALT: 27 U/L (ref 14–54)
ANION GAP: 11 (ref 5–15)
AST: 29 U/L (ref 15–41)
Alkaline Phosphatase: 42 U/L (ref 38–126)
BUN: 20 mg/dL (ref 6–20)
CHLORIDE: 103 mmol/L (ref 101–111)
CO2: 25 mmol/L (ref 22–32)
Calcium: 9 mg/dL (ref 8.9–10.3)
Creatinine, Ser: 1.09 mg/dL — ABNORMAL HIGH (ref 0.44–1.00)
GFR calc Af Amer: >60 mL/min (ref >60)
GFR calc non Af Amer: 53 mL/min — ABNORMAL LOW (ref >60)
GLUCOSE: 120 mg/dL — AB (ref 65–99)
POTASSIUM: 3.1 mmol/L — AB (ref 3.5–5.1)
SODIUM: 139 mmol/L (ref 135–145)
TOTAL PROTEIN: 7 g/dL (ref 6.5–8.1)
Total Bilirubin: 0.9 mg/dL (ref 0.3–1.2)

## 2014-11-15 LAB — MAGNESIUM: Magnesium: 1.8 mg/dL (ref 1.7–2.4)

## 2014-11-15 MED ORDER — SODIUM CHLORIDE 0.9 % IJ SOLN
10.0000 mL | INTRAMUSCULAR | Status: DC | PRN
  Administered 2014-11-15: 10 mL
  Filled 2014-11-15: qty 10

## 2014-11-15 MED ORDER — SODIUM CHLORIDE 0.9 % IV SOLN
INTRAVENOUS | Status: DC
  Administered 2014-11-15: 10:00:00 via INTRAVENOUS
  Filled 2014-11-15: qty 250

## 2014-11-15 MED ORDER — DIPHENHYDRAMINE HCL 25 MG PO CAPS
50.0000 mg | ORAL_CAPSULE | Freq: Once | ORAL | Status: AC
  Administered 2014-11-15: 50 mg via ORAL

## 2014-11-15 MED ORDER — HEPARIN SOD (PORK) LOCK FLUSH 100 UNIT/ML IV SOLN
500.0000 [IU] | Freq: Once | INTRAVENOUS | Status: AC | PRN
  Administered 2014-11-15: 500 [IU]
  Filled 2014-11-15: qty 5

## 2014-11-15 MED ORDER — ACETAMINOPHEN 325 MG PO TABS
650.0000 mg | ORAL_TABLET | Freq: Once | ORAL | Status: AC
  Administered 2014-11-15: 650 mg via ORAL
  Filled 2014-11-15: qty 2

## 2014-11-15 MED ORDER — SODIUM CHLORIDE 0.9 % IV SOLN
Freq: Once | INTRAVENOUS | Status: AC
  Administered 2014-11-15: 10:00:00 via INTRAVENOUS
  Filled 2014-11-15: qty 1000

## 2014-11-15 MED ORDER — TRASTUZUMAB CHEMO INJECTION 440 MG
6.0000 mg/kg | Freq: Once | INTRAVENOUS | Status: AC
  Administered 2014-11-15: 588 mg via INTRAVENOUS
  Filled 2014-11-15: qty 28

## 2014-11-15 NOTE — Progress Notes (Signed)
[place] @ [facility] Telephone:(336) [phone]  Fax:([phone]) [place] [facility]: May 14, 1950  MR#: [id number]  ITG#:[id number]  Patient Care Team: Jerrol Banana., MD as PCP - General (Family Medicine) Robert Bellow, MD as Consulting Physician (General Surgery) Nicholaus Bloom, MD as Consulting Physician (Plastic Surgery) Tammy Rondell Reams, [place] (Family Medicine)  CHIEF COMPLAINT:  Chief Complaint  Patient presents with  . Follow-up   Oncology History   1.  Abnormal mammogram in April of 2016 followed by ultrasound and based on clinical examination 1 cm tumor.  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  (July 05, 2014) 2.  Status post lumpectomy and sentinel lymph node evaluation tumor size is 1.4 cm sentinel lymph nodes negative.  Stage IC T1 cN0 M0 tumor 3.  Patient is going for breast reduction surgery in June of 2016 4.  Adjuvant chemotherapy started with Taxol and Herceptin on September 04, 2014   5.  Patient received total 10 treatment of Taxol and has been discontinued course of progressing neuropathy Patient was referred to radiation therapy (November 15, 2014)   Oncology Flowsheet 09/25/2014 10/02/2014 10/09/2014 10/16/2014 10/23/2014 10/30/2014 11/06/2014  Day, Cycle Day 1, Cycle 2 Day 8, Cycle 2 Day 15, Cycle 2 Day 1, Cycle 3 Day 8, Cycle 3 Day 15, Cycle 3 Day 1, Cycle 4  dexamethasone (DECADRON) IV [ 10 mg ] [ 10 mg ] [ 10 mg ] [ 10 mg ] [ 10 mg ] [ 10 mg ] [ 10 mg ]  ondansetron (ZOFRAN) IV [ 8 mg ] [ 8 mg ] [ 8 mg ] [ 8 mg ] [ 8 mg ] [ 8 mg ] [ 8 mg ]  PACLitaxel (TAXOL) IV 80 mg/m2 80 mg/m2 80 mg/m2 80 mg/m2 80 mg/m2 72 mg/m2 60 mg/m2  trastuzumab (HERCEPTIN) IV - 6 mg/kg - - 6 mg/kg - -    INTERVAL HISTORY: 64 year old lady here for continuation of chemotherapy with Taxol and Herceptin 1.4 cm tumor which is HER-2 positive.  Estrogen and progesterone receptor  negative. October 23, 2014 Patient is here for continuation of chemotherapy with Taxol and Herceptin. No chills fever.  Patient continues to problem not able to sleep.  No bony pain.  No nausea or vomiting.  Intermittent tingling numbness. August, 2016 patient is here for next few cycles of chemotherapy.  Continues to have difficulty sleeping.  Patient is drinking a lot of energy drink and become somewhat jittery.  No nausea.  No vomiting.  No diarrhea.. November 15, 2014 Patient is here for ongoing evaluation and treatment consideration.  Increasing numbness in upper and lower extremity. No chills fever appetite has been better.  Patient is here for continuation of Herceptin therapy.  Has received oral 10 weekly cycle of Taxol and has been discontinued because of progressing neuropathy.    REVIEW OF SYSTEMS:   GENERAL:  Feels good.  Active.  No fevers, sweats or weight loss.  PERFORMANCE STATUS (ECOG):  0 HEENT:  No visual changes, runny nose, sore throat, mouth sores or tenderness. Lungs: Dry hacking cough with expectoration Cardiac:  No chest pain, palpitations, orthopnea, or PND. GI:  No nausea, vomiting, complains of diarrhea this started 2 days after chemotherapy lasting for 2 days.  Still has 1 or 2 loose stool.  Did not take any Imodium.  Lower abdominal discomfort GU:  No urgency, frequency, dysuria, or hematuria.  Musculoskeletal: Muscle aches and joint pains Extremities:  No pain or swelling. Skin:  Patient complains of rash that appears after chemotherapy and gradually disappears. Neuro:  No headache, numbness or weakness, balance or coordination issues. Endocrine:  No diabetes, thyroid issues, hot flashes or night sweats. Psych:  No mood changes, depression or anxiety. Pain:  No focal pain. Review of systems:  All other systems reviewed and found to be negative. As per HPI. Otherwise, a complete review of systems is negatve.  PAST MEDICAL HISTORY: Past Medical History   Diagnosis Date  . Hypertension   . Hyperlipidemia   . GERD (gastroesophageal reflux disease)   . Depression 1995  . Cancer     skin   . 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 Complication of anesthesia 2004    oversensitive to noise, lights    PAST SURGICAL HISTORY: Past Surgical History  Procedure Laterality Date  . Back surgery    . Tonsillectomy    . Skin cancer  removal  2015    nose   . Colonoscopy  2013    Dr. Vira Agar  . Breast lumpectomy with axillary lymph node dissection Left 08/03/2014    Procedure: BREAST LUMPECTOMY WITH AXILLARY LYMPH NODE DISSECTION;  Surgeon: Robert Bellow, MD;  Location: [facility] ORS;  Service: General;  Laterality: Left;  . Sentinel node biopsy Left 08/03/2014    Procedure: SENTINEL NODE BIOPSY;  Surgeon: Robert Bellow, MD;  Location: [facility] ORS;  Service: General;  Laterality: Left;  . Axillary lymph node biopsy Left 08/03/2014    Procedure: AXILLARY LYMPH NODE BIOPSY;  Surgeon: Robert Bellow, MD;  Location: [facility] ORS;  Service: General;  Laterality: Left;  . Breast surgery Left Aug 03, 2014    Wide excision, sentinel node biopsy.  . Portacath placement Right 08/31/2014    Procedure: INSERTION PORT-A-CATH;  Surgeon: Robert Bellow, MD;  Location: [facility] ORS;  Service: General;  Laterality: Right;    FAMILY HISTORY Family History  Problem Relation Age of Onset  . Breast cancer Mother 42       . Breast cancer Cousin 62         ADVANCED DIRECTIVES:  Patient does not have any living will or healthcare power of attorney.  Information was given .  Available resources had been discussed.  We will follow-up on subsequent appointments regarding this issue  HEALTH MAINTENANCE: Social History  Substance Use Topics  . Smoking status: Former Smoker -- 1.00 packs/day for 2 years    Types: Cigarettes    Quit date: 07/30/1976  . Smokeless tobacco: None  . Alcohol Use: 0.0 - 1.2  oz/week    0-1 Glasses of wine, 0-1 Shots of liquor, 0 Standard drinks or equivalent per week      Allergies  Allergen Reactions  . Sulfa Antibiotics Hives and Rash    Current Outpatient Prescriptions  Medication Sig Dispense Refill  . cetirizine (ZYRTEC) 10 MG tablet Take 10 mg by mouth every morning. As needed for seasonal allergies.  3  . chlorpheniramine-HYDROcodone (TUSSIONEX) 10-8 MG/5ML SUER Take 5 mLs by mouth every 12 (twelve) hours as needed for cough. 140 mL 0  . docusate sodium (COLACE) 100 MG capsule Take 100 mg by mouth daily as needed for mild constipation.    Marland Kitchen HYDROcodone-acetaminophen (NORCO) 5-325 MG per tablet Take 1-2 tablets by mouth every 4 (four) hours as needed for moderate pain or  severe pain. 30 tablet 0  . KLOR-CON M20 20 MEQ tablet Take 1 tablet (20 mEq total) by mouth every morning. 30 tablet 0  . lidocaine-prilocaine (EMLA) cream Apply 1 application topically as needed. 30 g 3  . omeprazole (PRILOSEC) 40 MG capsule Take 40 mg by mouth every morning.   4  . ondansetron (ZOFRAN) 8 MG tablet Take 1 tablet (8 mg total) by mouth 2 (two) times daily. Start the day after chemo for 2 days. Then take as needed for nausea or vomiting. 30 tablet 1  . pravastatin (PRAVACHOL) 40 MG tablet Take 40 mg by mouth every morning.   3  . sertraline (ZOLOFT) 100 MG tablet Take 100 mg by mouth every morning.   4  . triamterene-hydrochlorothiazide (MAXZIDE) 75-50 MG per tablet Take 1 tablet by mouth every morning.   4   No current facility-administered medications for this visit.   Facility-Administered Medications Ordered in Other Visits  Medication Dose Route Frequency Provider Last Rate Last Dose  . acetaminophen (TYLENOL) tablet 650 mg  650 mg Oral Once Forest Gleason, MD      . diphenhydrAMINE (BENADRYL) capsule 50 mg  50 mg Oral Once Forest Gleason, MD      . sodium chloride 0.9 % injection 10 mL  10 mL Intravenous PRN Forest Gleason, MD   10 mL at 09/25/14 1418  . sodium  chloride 0.9 % injection 10 mL  10 mL Intracatheter PRN Forest Gleason, MD      . sodium chloride 0.9 % injection 10 mL  10 mL Intracatheter PRN Forest Gleason, MD   10 mL at 10/02/14 0900    OBJECTIVE:  Filed Vitals:   11/15/14 0841  BP: 136/84  Pulse: 106  Temp: 97.3 F (36.3 C)     Body mass index is 36.82 kg/(m^2).    ECOG FS:0 - Asymptomatic  PHYSICAL EXAM: General  status: Performance status is good.  Patient has not lost significant weight HEENT: No evidence of stomatitis. Sclera and conjunctivae :: No jaundice.   pale looking. Lungs: Air  entry equal on both sides.  No rhonchi.  No rales.  Cardiac: Heart sounds are normal.  No pericardial rub.  No murmur. Lymphatic system: Cervical, axillary, inguinal, lymph nodes not palpable GI: Abdomen is soft.  No ascites.  Liver spleen not palpable.  No tenderness.  Bowel sounds are within normal limit Lower extremity: No edema Neurological system: Higher functions, cranial nerves intact . 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  No ecchymosis.. Bilateral breast mammoplasty  Port site within normal limit healing well. Alopecia. No stomatitis   LAB RESULTS:  Infusion on 11/15/2014  Component Date Value Ref Range Status  . WBC 11/15/2014 6.2  3.6 - 11.0 K/uL Final  . RBC 11/15/2014 4.25  3.80 - 5.20 MIL/uL Final  . Hemoglobin 11/15/2014 12.6  12.0 - 16.0 g/dL Final  . HCT 11/15/2014 37.8  35.0 - 47.0 % Final  . MCV 11/15/2014 88.8  80.0 - 100.0 fL Final  . MCH 11/15/2014 29.7  26.0 - 34.0 pg Final  . MCHC 11/15/2014 33.4  32.0 - 36.0 g/dL Final  . RDW 11/15/2014 20.3* 11.5 - 14.5 % Final  . Platelets 11/15/2014 218  150 - 440 K/uL Final  . Neutrophils Relative % 11/15/2014 48   Final  . Neutro Abs 11/15/2014 3.0  1.4 - 6.5 K/uL Final  . Lymphocytes Relative 11/15/2014 40   Final  . Lymphs Abs  11/15/2014 2.4  1.0 - 3.6 K/uL Final  . Monocytes Relative 11/15/2014 10   Final  . Monocytes  Absolute 11/15/2014 0.6  0.2 - 0.9 K/uL Final  . Eosinophils Relative 11/15/2014 1   Final  . Eosinophils Absolute 11/15/2014 0.1  0 - 0.7 K/uL Final  . Basophils Relative 11/15/2014 1   Final  . Basophils Absolute 11/15/2014 0.1  0 - 0.1 K/uL Final      STUDIES: No results found.  ASSESSMENT: Carcinoma of breast status post lumpectomy and sentinel lymph node (left breast) stage IC Estrogen and progesterone receptor negative HER-2/neu positive tumor Status post mammoplasty on both breast All lab data has been reviewed.  MEDICAL DECISION MAKING:   We will discontinue Taxol at present time.  Continue Herceptin every 3 weeks Patient will be evaluated for radiation therapy Total duration of visit was 4mnutes.  50% or more time was spent in counseling patient and family regarding prognosis and options of treatment and available resources    Patient expressed understanding and was in agreement with this plan. She also understands that She can call clinic at any time with any questions, concerns, or complaints.    Carcinoma of left breast   Staging form: Breast, AJCC 7th Edition     Clinical: Stage IA (T1c, N0, M0) - UMarni Griffon MD   11/15/2014 8:57 AM

## 2014-11-15 NOTE — Progress Notes (Signed)
Patient does have living will.  Former smoker.  Patient continues to have diarrhea.  Also c/o swelling in her right foot and ankle.

## 2014-11-19 ENCOUNTER — Encounter: Payer: Self-pay | Admitting: Oncology

## 2014-11-21 ENCOUNTER — Encounter: Payer: Self-pay | Admitting: *Deleted

## 2014-11-21 ENCOUNTER — Other Ambulatory Visit: Payer: Self-pay | Admitting: *Deleted

## 2014-11-21 DIAGNOSIS — C50912 Malignant neoplasm of unspecified site of left female breast: Secondary | ICD-10-CM

## 2014-11-21 NOTE — Progress Notes (Signed)
Patient called with complaints of possible "UTI".  States she is having frequency with little output, denies pain or burning.  States her abdomen is "swollen and tight, and cramping".  Discussed with Hayley, Dr. Metro Kung nurse.  He would like the patient to come in tomorrow for blood work, urine specimen and to see him.  Hayley put in order to schedulers to schedule patient.  Patient notified of the plan.

## 2014-11-22 ENCOUNTER — Encounter: Payer: Self-pay | Admitting: Oncology

## 2014-11-22 ENCOUNTER — Inpatient Hospital Stay: Payer: BLUE CROSS/BLUE SHIELD

## 2014-11-22 ENCOUNTER — Inpatient Hospital Stay (HOSPITAL_BASED_OUTPATIENT_CLINIC_OR_DEPARTMENT_OTHER): Payer: BLUE CROSS/BLUE SHIELD | Admitting: Oncology

## 2014-11-22 VITALS — BP 122/68 | HR 83 | Temp 98.2°F | Wt 221.8 lb

## 2014-11-22 DIAGNOSIS — C50912 Malignant neoplasm of unspecified site of left female breast: Secondary | ICD-10-CM

## 2014-11-22 DIAGNOSIS — Z171 Estrogen receptor negative status [ER-]: Secondary | ICD-10-CM

## 2014-11-22 DIAGNOSIS — R112 Nausea with vomiting, unspecified: Secondary | ICD-10-CM | POA: Diagnosis not present

## 2014-11-22 DIAGNOSIS — Z87891 Personal history of nicotine dependence: Secondary | ICD-10-CM

## 2014-11-22 DIAGNOSIS — Z79899 Other long term (current) drug therapy: Secondary | ICD-10-CM

## 2014-11-22 DIAGNOSIS — R197 Diarrhea, unspecified: Secondary | ICD-10-CM

## 2014-11-22 DIAGNOSIS — K219 Gastro-esophageal reflux disease without esophagitis: Secondary | ICD-10-CM

## 2014-11-22 DIAGNOSIS — R5383 Other fatigue: Secondary | ICD-10-CM

## 2014-11-22 DIAGNOSIS — Z85828 Personal history of other malignant neoplasm of skin: Secondary | ICD-10-CM

## 2014-11-22 DIAGNOSIS — E876 Hypokalemia: Secondary | ICD-10-CM

## 2014-11-22 DIAGNOSIS — G629 Polyneuropathy, unspecified: Secondary | ICD-10-CM

## 2014-11-22 DIAGNOSIS — F329 Major depressive disorder, single episode, unspecified: Secondary | ICD-10-CM

## 2014-11-22 DIAGNOSIS — R630 Anorexia: Secondary | ICD-10-CM

## 2014-11-22 DIAGNOSIS — I10 Essential (primary) hypertension: Secondary | ICD-10-CM

## 2014-11-22 DIAGNOSIS — E785 Hyperlipidemia, unspecified: Secondary | ICD-10-CM

## 2014-11-22 LAB — COMPREHENSIVE METABOLIC PANEL
ALBUMIN: 4.1 g/dL (ref 3.5–5.0)
ALK PHOS: 50 U/L (ref 38–126)
ALT: 29 U/L (ref 14–54)
ANION GAP: 10 (ref 5–15)
AST: 35 U/L (ref 15–41)
BILIRUBIN TOTAL: 1.2 mg/dL (ref 0.3–1.2)
BUN: 17 mg/dL (ref 6–20)
CALCIUM: 9.3 mg/dL (ref 8.9–10.3)
CO2: 26 mmol/L (ref 22–32)
Chloride: 105 mmol/L (ref 101–111)
Creatinine, Ser: 1.28 mg/dL — ABNORMAL HIGH (ref 0.44–1.00)
GFR calc Af Amer: 50 mL/min — ABNORMAL LOW (ref >60)
GFR, EST NON AFRICAN AMERICAN: 44 mL/min — AB (ref >60)
GLUCOSE: 108 mg/dL — AB (ref 65–99)
POTASSIUM: 3.3 mmol/L — AB (ref 3.5–5.1)
Sodium: 141 mmol/L (ref 135–145)
TOTAL PROTEIN: 7.2 g/dL (ref 6.5–8.1)

## 2014-11-22 LAB — URINALYSIS COMPLETE WITH MICROSCOPIC (ARMC ONLY)
BACTERIA UA: NONE SEEN
BILIRUBIN URINE: NEGATIVE
GLUCOSE, UA: NEGATIVE mg/dL
HGB URINE DIPSTICK: NEGATIVE
Ketones, ur: NEGATIVE mg/dL
Leukocytes, UA: NEGATIVE
NITRITE: NEGATIVE
Protein, ur: NEGATIVE mg/dL
Specific Gravity, Urine: 1.01 (ref 1.005–1.030)
pH: 5 (ref 5.0–8.0)

## 2014-11-22 LAB — CBC WITH DIFFERENTIAL/PLATELET
Basophils Absolute: 0.1 10*3/uL (ref 0–0.1)
Basophils Relative: 1 %
Eosinophils Absolute: 0.1 10*3/uL (ref 0–0.7)
Eosinophils Relative: 1 %
HEMATOCRIT: 39 % (ref 35.0–47.0)
HEMOGLOBIN: 13.2 g/dL (ref 12.0–16.0)
LYMPHS ABS: 3.4 10*3/uL (ref 1.0–3.6)
LYMPHS PCT: 40 %
MCH: 30.1 pg (ref 26.0–34.0)
MCHC: 33.7 g/dL (ref 32.0–36.0)
MCV: 89.2 fL (ref 80.0–100.0)
MONO ABS: 1 10*3/uL — AB (ref 0.2–0.9)
MONOS PCT: 12 %
NEUTROS ABS: 3.9 10*3/uL (ref 1.4–6.5)
NEUTROS PCT: 46 %
Platelets: 180 10*3/uL (ref 150–440)
RBC: 4.37 MIL/uL (ref 3.80–5.20)
RDW: 19.3 % — AB (ref 11.5–14.5)
WBC: 8.5 10*3/uL (ref 3.6–11.0)

## 2014-11-22 LAB — MAGNESIUM: Magnesium: 1.9 mg/dL (ref 1.7–2.4)

## 2014-11-22 MED ORDER — CIPROFLOXACIN HCL 500 MG PO TABS
500.0000 mg | ORAL_TABLET | Freq: Two times a day (BID) | ORAL | Status: DC
Start: 2014-11-22 — End: 2014-11-30

## 2014-11-23 LAB — URINE CULTURE: CULTURE: NO GROWTH

## 2014-11-24 ENCOUNTER — Institutional Professional Consult (permissible substitution): Payer: BLUE CROSS/BLUE SHIELD | Admitting: Radiation Oncology

## 2014-11-24 ENCOUNTER — Ambulatory Visit: Payer: BLUE CROSS/BLUE SHIELD | Admitting: Radiation Oncology

## 2014-11-26 ENCOUNTER — Encounter: Payer: Self-pay | Admitting: Oncology

## 2014-11-26 NOTE — Progress Notes (Signed)
[place] @ [facility] [phone]  Fax:([phone]) [place] OB: 02-12-1951  MR#: [id number]  TSV#:[id number]  Patient Care Team: Jerrol Banana., MD as PCP - General (Family Medicine) Robert Bellow, MD as Consulting Physician (General Surgery) Nicholaus Bloom, MD as Consulting Physician (Plastic Surgery) [place], [place] (Family Medicine)  CHIEF COMPLAINT:  No chief complaint on file.  Oncology History   1.  Abnormal mammogram in April of 2016 followed by ultrasound and based on clinical examination 1 cm tumor.  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  (July 05, 2014) 2.  Status post lumpectomy and sentinel lymph node evaluation tumor size is 1.4 cm sentinel lymph nodes negative.  Stage IC T1 cN0 M0 tumor 3.  Patient is going for breast reduction surgery in June of 2016 4.  Adjuvant chemotherapy started with Taxol and Herceptin on September 04, 2014   5.  Patient received total 10 treatment of Taxol and has been discontinued course of progressing neuropathy Patient was referred to radiation therapy (November 15, 2014)   Oncology Flowsheet 10/02/2014 10/09/2014 10/16/2014 10/23/2014 10/30/2014 11/06/2014 11/15/2014  Day, Cycle Day 8, Cycle 2 Day 15, Cycle 2 Day 1, Cycle 3 Day 8, Cycle 3 Day 15, Cycle 3 Day 1, Cycle 4 -  dexamethasone (DECADRON) IV [ 10 mg ] [ 10 mg ] [ 10 mg ] [ 10 mg ] [ 10 mg ] [ 10 mg ] -  diphenhydrAMINE (BENADRYL) PO - - - - - - 50 mg  ondansetron (ZOFRAN) IV [ 8 mg ] [ 8 mg ] [ 8 mg ] [ 8 mg ] [ 8 mg ] [ 8 mg ] -  PACLitaxel (TAXOL) IV 80 mg/m2 80 mg/m2 80 mg/m2 80 mg/m2 72 mg/m2 60 mg/m2 -  trastuzumab (HERCEPTIN) IV 6 mg/kg - - 6 mg/kg - - 6 mg/kg    INTERVAL HISTORY: 64 year old lady here for continuation of chemotherapy with Taxol and Herceptin 1.4 cm tumor which is HER-2 positive.  Estrogen and progesterone receptor negative. October 23, 2014 Patient is here for continuation of chemotherapy with Taxol and Herceptin. No chills fever.  Patient continues to problem not able to sleep.  No bony pain.  No nausea or vomiting.  Intermittent tingling numbness. August, 2016 patient is here for next few cycles of chemotherapy.  Continues to have difficulty sleeping.  Patient is drinking a lot of energy drink and become somewhat jittery.  No nausea.  No vomiting.  No diarrhea.. November 15, 2014 Patient is here for ongoing evaluation and treatment consideration.  Increasing numbness in upper and lower extremity. No chills fever appetite has been better.  Patient is here for continuation of Herceptin therapy.  Has received oral 10 weekly cycle of Taxol and has been discontinued because of progressing neuropathy.  November 22, 2014 The patient only received Herceptin therapy last week.  Patient came as an add-on complaining of diarrhea which is now resolved lower abdominal discomfort persistent nausea which is gradually getting better feeling weak tired.  REVIEW OF SYSTEMS:    general status: Patient is feeling weak and tired.  No change in a performance status.  No chills.  No fever. HEENT:   No evidence of stomatitis Lungs: No cough or shortness of breath Cardiac: No chest pain or paroxysmal nocturnal dyspnea GI:as per interval history.  New-onset of nausea vomiting diarrhea Skin: No rash  Lower extremity no swelling Neurological system: No tingling.  No numbness.  No other focal signs Musculoskeletal system no bony pains . As per HPI. Otherwise, a complete review of systems is negatve.  PAST MEDICAL HISTORY: Past Medical History  Diagnosis Date  . Hypertension   . Hyperlipidemia   . GERD (gastroesophageal reflux disease)   . Depression 1995  . Cancer     skin   . 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 Complication of anesthesia 2004    oversensitive to noise,  lights    PAST SURGICAL HISTORY: Past Surgical History  Procedure Laterality Date  . Back surgery    . Tonsillectomy    . Skin cancer  removal  2015    nose   . Colonoscopy  2013    Dr. Vira Agar  . Breast lumpectomy with axillary lymph node dissection Left 08/03/2014    Procedure: BREAST LUMPECTOMY WITH AXILLARY LYMPH NODE DISSECTION;  Surgeon: Robert Bellow, MD;  Location: [facility] ORS;  Service: General;  Laterality: Left;  . Sentinel node biopsy Left 08/03/2014    Procedure: SENTINEL NODE BIOPSY;  Surgeon: Robert Bellow, MD;  Location: [facility] ORS;  Service: General;  Laterality: Left;  . Axillary lymph node biopsy Left 08/03/2014    Procedure: AXILLARY LYMPH NODE BIOPSY;  Surgeon: Robert Bellow, MD;  Location: [facility] ORS;  Service: General;  Laterality: Left;  . Breast surgery Left Aug 03, 2014    Wide excision, sentinel node biopsy.  . Portacath placement Right 08/31/2014    Procedure: INSERTION PORT-A-CATH;  Surgeon: Robert Bellow, MD;  Location: [facility] ORS;  Service: General;  Laterality: Right;    FAMILY HISTORY Family History  Problem Relation Age of Onset  . Breast cancer Mother 69       . Breast cancer Cousin 62         ADVANCED DIRECTIVES:  Patient does not have any living will or healthcare power of attorney.  Information was given .  Available resources had been discussed.  We will follow-up on subsequent appointments regarding this issue  HEALTH MAINTENANCE: Social History  Substance Use Topics  . Smoking status: Former Smoker -- 1.00 packs/day for 2 years    Types: Cigarettes    Quit date: 07/30/1976  . Smokeless tobacco: None  . Alcohol Use: 0.0 - 1.2 oz/week    0-1 Glasses of wine, 0-1 Shots of liquor, 0 Standard drinks or equivalent per week      Allergies  Allergen Reactions  . Sulfa Antibiotics Hives and Rash    Current Outpatient Prescriptions  Medication Sig Dispense Refill  . chlorpheniramine-HYDROcodone (TUSSIONEX) 10-8 MG/5ML SUER Take  5 mLs by mouth every 12 (twelve) hours as needed for cough. 140 mL 0  . HYDROcodone-acetaminophen (NORCO) 5-325 MG per tablet Take 1-2 tablets by mouth every 4 (four) hours as needed for moderate pain or severe pain. 30 tablet 0  . KLOR-CON M20 20 MEQ tablet Take 1 tablet (20 mEq total) by mouth every morning. 30 tablet 0  . lidocaine-prilocaine (EMLA) cream Apply 1 application topically as needed. 30 g 3  . omeprazole (PRILOSEC) 40 MG capsule Take 40 mg by mouth every morning.   4  . ondansetron (ZOFRAN) 8 MG tablet Take 1 tablet (8 mg total) by mouth 2 (two) times daily. Start the day after chemo for 2 days. Then take as needed for nausea or vomiting. 30 tablet  1  . pravastatin (PRAVACHOL) 40 MG tablet Take 40 mg by mouth every morning.   3  . sertraline (ZOLOFT) 100 MG tablet Take 100 mg by mouth every morning.   4  . triamterene-hydrochlorothiazide (MAXZIDE) 75-50 MG per tablet Take 1 tablet by mouth every morning.   4  . ciprofloxacin (CIPRO) 500 MG tablet Take 1 tablet (500 mg total) by mouth 2 (two) times daily. 14 tablet 0   No current facility-administered medications for this visit.   Facility-Administered Medications Ordered in Other Visits  Medication Dose Route Frequency Provider Last Rate Last Dose  . acetaminophen (TYLENOL) tablet 650 mg  650 mg Oral Once Forest Gleason, MD      . diphenhydrAMINE (BENADRYL) capsule 50 mg  50 mg Oral Once Forest Gleason, MD      . sodium chloride 0.9 % injection 10 mL  10 mL Intravenous PRN Forest Gleason, MD   10 mL at 09/25/14 1418    OBJECTIVE:  Filed Vitals:   11/22/14 1516  BP: 122/68  Pulse: 83  Temp: 98.2 F (36.8 C)     Body mass index is 36.91 kg/(m^2).    ECOG FS:0 - Asymptomatic  PHYSICAL EXAM: General  status: Performance status is good.  Patient has not lost significant weight HEENT: No evidence of stomatitis. Sclera and conjunctivae :: No jaundice.   pale looking. Lungs: Air  entry equal on both sides.  No rhonchi.  No rales.   Cardiac: Heart sounds are normal.  No pericardial rub.  No murmur. Lymphatic system: Cervical, axillary, inguinal, lymph nodes not palpable GI: Abdomen is soft.  No ascites.  Liver spleen not palpable.  No tenderness.  Bowel sounds are within normal limit Lower extremity: No edema Neurological system: Higher functions, cranial nerves intact . 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  No ecchymosis.. Bilateral breast mammoplasty  Port site within normal limit healing well. Alopecia. No stomatitis   LAB RESULTS:  Appointment on 11/22/2014  Component Date Value Ref Range Status  . WBC 11/22/2014 8.5  3.6 - 11.0 K/uL Final  . RBC 11/22/2014 4.37  3.80 - 5.20 MIL/uL Final  . Hemoglobin 11/22/2014 13.2  12.0 - 16.0 g/dL Final  . HCT 11/22/2014 39.0  35.0 - 47.0 % Final  . MCV 11/22/2014 89.2  80.0 - 100.0 fL Final  . MCH 11/22/2014 30.1  26.0 - 34.0 pg Final  . MCHC 11/22/2014 33.7  32.0 - 36.0 g/dL Final  . RDW 11/22/2014 19.3* 11.5 - 14.5 % Final  . Platelets 11/22/2014 180  150 - 440 K/uL Final  . Neutrophils Relative % 11/22/2014 46   Final  . Neutro Abs 11/22/2014 3.9  1.4 - 6.5 K/uL Final  . Lymphocytes Relative 11/22/2014 40   Final  . Lymphs Abs 11/22/2014 3.4  1.0 - 3.6 K/uL Final  . Monocytes Relative 11/22/2014 12   Final  . Monocytes Absolute 11/22/2014 1.0* 0.2 - 0.9 K/uL Final  . Eosinophils Relative 11/22/2014 1   Final  . Eosinophils Absolute 11/22/2014 0.1  0 - 0.7 K/uL Final  . Basophils Relative 11/22/2014 1   Final  . Basophils Absolute 11/22/2014 0.1  0 - 0.1 K/uL Final  . Sodium 11/22/2014 141  135 - 145 mmol/L Final  . Potassium 11/22/2014 3.3* 3.5 - 5.1 mmol/L Final  . Chloride 11/22/2014 105  101 - 111 mmol/L Final  . CO2 11/22/2014 26  22 - 32 mmol/L Final  . Glucose,  Bld 11/22/2014 108* 65 - 99 mg/dL Final  . BUN 11/22/2014 17  6 - 20 mg/dL Final  . Creatinine, Ser 11/22/2014 1.28* 0.44 - 1.00 mg/dL Final  . Calcium  11/22/2014 9.3  8.9 - 10.3 mg/dL Final  . Total Protein 11/22/2014 7.2  6.5 - 8.1 g/dL Final  . Albumin 11/22/2014 4.1  3.5 - 5.0 g/dL Final  . AST 11/22/2014 35  15 - 41 U/L Final  . ALT 11/22/2014 29  14 - 54 U/L Final  . Alkaline Phosphatase 11/22/2014 50  38 - 126 U/L Final  . Total Bilirubin 11/22/2014 1.2  0.3 - 1.2 mg/dL Final  . GFR calc non Af Amer 11/22/2014 44* >60 mL/min Final  . GFR calc Af Amer 11/22/2014 50* >60 mL/min Final   Comment: (NOTE) The eGFR has been calculated using the CKD EPI equation. This calculation has not been validated in all clinical situations. eGFR's persistently <60 mL/min signify possible Chronic Kidney Disease.   . Anion gap 11/22/2014 10  5 - 15 Final  . Magnesium 11/22/2014 1.9  1.7 - 2.4 mg/dL Final  . Specimen Description 11/22/2014 URINE, CLEAN CATCH   Final  . Special Requests 11/22/2014 URINE   Final  . Culture 11/22/2014 NO GROWTH   Final  . Report Status 11/22/2014 11/23/2014 FINAL   Final  . Color, Urine 11/22/2014 YELLOW* YELLOW Final  . APPearance 11/22/2014 CLOUDY* CLEAR Final  . Glucose, UA 11/22/2014 NEGATIVE  NEGATIVE mg/dL Final  . Bilirubin Urine 11/22/2014 NEGATIVE  NEGATIVE Final  . Ketones, ur 11/22/2014 NEGATIVE  NEGATIVE mg/dL Final  . Specific Gravity, Urine 11/22/2014 1.010  1.005 - 1.030 Final  . Hgb urine dipstick 11/22/2014 NEGATIVE  NEGATIVE Final  . pH 11/22/2014 5.0  5.0 - 8.0 Final  . Protein, ur 11/22/2014 NEGATIVE  NEGATIVE mg/dL Final  . Nitrite 11/22/2014 NEGATIVE  NEGATIVE Final  . Leukocytes, UA 11/22/2014 NEGATIVE  NEGATIVE Final  . RBC / HPF 11/22/2014 0-5  0 - 5 RBC/hpf Final  . WBC, UA 11/22/2014 0-5  0 - 5 WBC/hpf Final  . Bacteria, UA 11/22/2014 NONE SEEN  NONE SEEN Final  . Squamous Epithelial / LPF 11/22/2014 0-5* NONE SEEN Final      STUDIES: No results found.  ASSESSMENT: Carcinoma of breast status post lumpectomy and sentinel lymph node (left breast) stage IC Estrogen and progesterone  receptor negative HER-2/neu positive tumor Status post mammoplasty on both breast All lab data has been reviewed. November 21, 2013 Patient is acute onset of new symptoms of diarrhea and nausea and vomiting most likely unrelated to chemotherapy Possibility of either viral onset or incompatible food cannot be ruled out Patient was advised to symptomatic therapy and call me if problems continues He has hypokalemia which will be corrected by oral supplement MEDICAL DECISION MAKING:   We will discontinue Taxol at present time.  Continue Herceptin every 3 weeks Patient will be evaluated for radiation therapy Total duration of visit was 58mnutes.  50% or more time was spent in counseling patient and family regarding prognosis and options of treatment and available resources    Patient expressed understanding and was in agreement with this plan. She also understands that She can call clinic at any time with any questions, concerns, or complaints.    Carcinoma of left breast   Staging form: Breast, AJCC 7th Edition     Clinical: Stage IA (T1c, N0, M0) - UMarni Griffon MD   11/26/2014 12:43 PM

## 2014-11-30 ENCOUNTER — Encounter: Payer: Self-pay | Admitting: Radiation Oncology

## 2014-11-30 ENCOUNTER — Encounter (INDEPENDENT_AMBULATORY_CARE_PROVIDER_SITE_OTHER): Payer: Self-pay

## 2014-11-30 ENCOUNTER — Ambulatory Visit
Admission: RE | Admit: 2014-11-30 | Discharge: 2014-11-30 | Disposition: A | Discharge status: Home / Self Care | Payer: BLUE CROSS/BLUE SHIELD | Source: Ambulatory Visit | Attending: Radiation Oncology | Admitting: Radiation Oncology

## 2014-11-30 VITALS — BP 132/73 | HR 77 | Temp 98.0°F | Resp 18 | Wt 223.4 lb

## 2014-11-30 DIAGNOSIS — Z171 Estrogen receptor negative status [ER-]: Secondary | ICD-10-CM | POA: Insufficient documentation

## 2014-11-30 DIAGNOSIS — Z87891 Personal history of nicotine dependence: Secondary | ICD-10-CM | POA: Insufficient documentation

## 2014-11-30 DIAGNOSIS — C50912 Malignant neoplasm of unspecified site of left female breast: Secondary | ICD-10-CM

## 2014-11-30 DIAGNOSIS — Z510 Encounter for antineoplastic radiation therapy: Secondary | ICD-10-CM | POA: Insufficient documentation

## 2014-11-30 NOTE — Consult Note (Signed)
Except an outstanding is perfect of Radiation Oncology NEW PATIENT EVALUATION  Name: Madison Christensen  MRN: [id number]  Date:   11/30/2014     DOB: [birth year]   This 64 y.o. female patient presents to the clinic for initial evaluation of breast cancer stage IC (T1 CN 0 M0) ER/PR negative HER-2/neu overexpressed status post wide local excision bilateral breast reconstruction and adjuvant chemotherapy with Herceptin.  REFERRING PHYSICIAN: Jerrol Banana.,*  CHIEF COMPLAINT:  Chief Complaint  Patient presents with  . Breast Cancer    Pt is here for initial consultation for breast cancer.      DIAGNOSIS: The encounter diagnosis was Malignant neoplasm of female breast, left.   PREVIOUS INVESTIGATIONS:  Mammograms and ultrasound reviewed Clinical notes reviewed Surgical pathology reports reviewed  HPI: Patient is a 64 year old female who presented with an abnormal mammogram of the left breast showing a hypoechoic irregular spiculated mass 11:00 position 7 cm from the nipple measuring 1 x 0.8 cm. This was confirmed on ultrasound. She underwent a biopsy which was positive for invasive ductal carcinoma ER/PR negative HER-2/neu overexpressed in April 2016. She went on to have a wide local excision for a 1.4 cm with margins clear. Sentinel lymph node was negative. She went on to have breast reduction surgery of June 2016 followed by Taxol Herceptin. Taxol was discontinued after 10 cycles secondary to peripheral neuropathy. She continues on Herceptin without significant side effect. She is seen today for radiation oncology opinion. She specifically denies breast tenderness cough or bone pain.  PLANNED TREATMENT REGIMEN: Whole breast radiation  PAST MEDICAL HISTORY:  has a past medical history of Hypertension; Hyperlipidemia; GERD (gastroesophageal reflux disease); Depression (1995); Cancer; Carcinoma of left breast (7/82/9562); and Complication of anesthesia (2004).    PAST SURGICAL  HISTORY:  Past Surgical History  Procedure Laterality Date  . Back surgery    . Tonsillectomy    . Skin cancer  removal  2015    nose   . Colonoscopy  2013    Dr. Vira Agar  . Breast lumpectomy with axillary lymph node dissection Left 08/03/2014    Procedure: BREAST LUMPECTOMY WITH AXILLARY LYMPH NODE DISSECTION;  Surgeon: Robert Bellow, MD;  Location: [facility] OR[facility];  Service: General;  Laterality: Left;  . Sentinel node biopsy Left 08/03/2014    Procedure: SENTINEL NODE BIOPSY;  Surgeon: Robert Bellow, MD;  Location: [facility];  Service: General;  Laterality: Left;  . Axillary lymph node biopsy Left 08/03/2014    Procedure: AXILLARY LYMPH NODE BIOPSY;  Surgeon: Robert Bellow, MD;  Location: [facility];  Service: General;  Laterality: Left;  . Breast surgery Left Aug 03, 2014    Wide excision, sentinel node biopsy.  . Portacath placement Right 08/31/2014    Procedure: INSERTION PORT-A-CATH;  Surgeon: Robert Bellow, MD;  Location: [facility] OR[facility];  Service: General;  Laterality: Right;    FAMILY HISTORY: family history includes Breast cancer (age of onset: 11) in her cousin; Breast cancer (age of onset: 11) in her mother.  SOCIAL HISTORY:  reports that she quit smoking about 38 years ago. Her smoking use included Cigarettes. She has a 2 pack-year smoking history. She does not have any smokeless tobacco history on file. She reports that she drinks alcohol. She reports that she does not use illicit drugs.  ALLERGIES: Sulfa antibiotics  MEDICATIONS:  Current Outpatient Prescriptions  Medication Sig Dispense Refill  . chlorpheniramine-HYDROcodone (TUSSIONEX) 10-8 MG/5ML SUER Take 5 mLs by mouth every 12 (twelve)  hours as needed for cough. 140 mL 0  . HYDROcodone-acetaminophen (NORCO) 5-325 MG per tablet Take 1-2 tablets by mouth every 4 (four) hours as needed for moderate pain or severe pain. 30 tablet 0  . KLOR-CON M20 20 MEQ tablet Take 1 tablet (20 mEq total) by mouth every morning. 30  tablet 0  . lidocaine-prilocaine (EMLA) cream Apply 1 application topically as needed. 30 g 3  . omeprazole (PRILOSEC) 40 MG capsule Take 40 mg by mouth every morning.   4  . ondansetron (ZOFRAN) 8 MG tablet Take 1 tablet (8 mg total) by mouth 2 (two) times daily. Start the day after chemo for 2 days. Then take as needed for nausea or vomiting. 30 tablet 1  . pravastatin (PRAVACHOL) 40 MG tablet Take 40 mg by mouth every morning.   3  . sertraline (ZOLOFT) 100 MG tablet Take 100 mg by mouth every morning.   4  . triamterene-hydrochlorothiazide (MAXZIDE) 75-50 MG per tablet Take 1 tablet by mouth every morning.   4   No current facility-administered medications for this encounter.   Facility-Administered Medications Ordered in Other Encounters  Medication Dose Route Frequency Allea Kassner Last Rate Last Dose  . acetaminophen (TYLENOL) tablet 650 mg  650 mg Oral Once Forest Gleason, MD      . diphenhydrAMINE (BENADRYL) capsule 50 mg  50 mg Oral Once Forest Gleason, MD      . sodium chloride 0.9 % injection 10 mL  10 mL Intravenous PRN Forest Gleason, MD   10 mL at 09/25/14 1418    ECOG PERFORMANCE STATUS:  0 - Asymptomatic  REVIEW OF SYSTEMS:  Patient denies any weight loss, fatigue, weakness, fever, chills or night sweats. Patient denies any loss of vision, blurred vision. Patient denies any ringing  of the ears or hearing loss. No irregular heartbeat. Patient denies heart murmur or history of fainting. Patient denies any chest pain or pain radiating to her upper extremities. Patient denies any shortness of breath, difficulty breathing at night, cough or hemoptysis. Patient denies any swelling in the lower legs. Patient denies any nausea vomiting, vomiting of blood, or coffee ground material in the vomitus. Patient denies any stomach pain. Patient states has had normal bowel movements no significant constipation or diarrhea. Patient denies any dysuria, hematuria or significant nocturia. Patient denies any  problems walking, swelling in the joints or loss of balance. Patient denies any skin changes, loss of hair or loss of weight. Patient denies any excessive worrying or anxiety or significant depression. Patient denies any problems with insomnia. Patient denies excessive thirst, polyuria, polydipsia. Patient denies any swollen glands, patient denies easy bruising or easy bleeding. Patient denies any recent infections, allergies or URI. Patient "s visual fields have not changed significantly in recent time.    PHYSICAL EXAM: BP 132/73 mmHg  Pulse 77  Temp(Src) 98 F (36.7 C)  Resp 18  Wt 223 lb 7 oz (101.35 kg) Patient is mildly obese. She is status post bilateral breast reductions with good cosmetic result. She does not have a left wide local excision scar that has been removed. She does have a firm lumpectomy cavity around the 12:00 position of the left breast. No other dominant mass or nodularity is noted in 2 positions examined. No axillary or supraclavicular adenopathy is appreciated. Well-developed well-nourished patient in NAD. HEENT reveals PERLA, EOMI, discs not visualized.  Oral cavity is clear. No oral mucosal lesions are identified. Neck is clear without evidence of cervical or supraclavicular adenopathy.  Lungs are clear to A&P. Cardiac examination is essentially unremarkable with regular rate and rhythm without murmur rub or thrill. Abdomen is benign with no organomegaly or masses noted. Motor sensory and DTR levels are equal and symmetric in the upper and lower extremities. Cranial nerves II through XII are grossly intact. Proprioception is intact. No peripheral adenopathy or edema is identified. No motor or sensory levels are noted. Crude visual fields are within normal range.   LABORATORY DATA: Pathology reports reviewed    RADIOLOGY RESULTS: Mammograms and ultrasound reviewed   IMPRESSION: Stage I invasive mammary carcinoma the left breast status post wide local excision breast  reduction surgery and adjuvant chemotherapy including continuation on Herceptin for T1c lesion.  PLAN: At this time I to go ahead with whole breast radiation to 5000 cGy. She has a rather large breast making hypofractionated course of treatment difficult. Also would boost her scar another 1400 cGy using electron beam and target the lumpectomy site which we can palpate. Risks and benefits of treatment including skin reaction, fatigue, alteration of blood counts, inclusion of superficial lung all were explained in detail to the patient. She seems to comprehend my treatment plan well. I have set up and ordered CT simulation early next week. I discussed the case personally with medical oncology.  I would like to take this opportunity for allowing me to participate in the care of your patient.Armstead Peaks., MD

## 2014-12-04 ENCOUNTER — Inpatient Hospital Stay: Payer: BLUE CROSS/BLUE SHIELD

## 2014-12-04 ENCOUNTER — Other Ambulatory Visit: Payer: Self-pay | Admitting: *Deleted

## 2014-12-04 ENCOUNTER — Inpatient Hospital Stay (HOSPITAL_BASED_OUTPATIENT_CLINIC_OR_DEPARTMENT_OTHER): Payer: BLUE CROSS/BLUE SHIELD | Admitting: Oncology

## 2014-12-04 ENCOUNTER — Encounter: Payer: Self-pay | Admitting: Oncology

## 2014-12-04 VITALS — BP 132/84 | HR 97 | Temp 97.0°F | Wt 222.0 lb

## 2014-12-04 DIAGNOSIS — F329 Major depressive disorder, single episode, unspecified: Secondary | ICD-10-CM

## 2014-12-04 DIAGNOSIS — E785 Hyperlipidemia, unspecified: Secondary | ICD-10-CM

## 2014-12-04 DIAGNOSIS — I10 Essential (primary) hypertension: Secondary | ICD-10-CM

## 2014-12-04 DIAGNOSIS — R5383 Other fatigue: Secondary | ICD-10-CM

## 2014-12-04 DIAGNOSIS — R112 Nausea with vomiting, unspecified: Secondary | ICD-10-CM | POA: Diagnosis not present

## 2014-12-04 DIAGNOSIS — Z171 Estrogen receptor negative status [ER-]: Secondary | ICD-10-CM

## 2014-12-04 DIAGNOSIS — C50912 Malignant neoplasm of unspecified site of left female breast: Secondary | ICD-10-CM

## 2014-12-04 DIAGNOSIS — R197 Diarrhea, unspecified: Secondary | ICD-10-CM

## 2014-12-04 DIAGNOSIS — Z87891 Personal history of nicotine dependence: Secondary | ICD-10-CM

## 2014-12-04 DIAGNOSIS — Z79899 Other long term (current) drug therapy: Secondary | ICD-10-CM

## 2014-12-04 DIAGNOSIS — Z85828 Personal history of other malignant neoplasm of skin: Secondary | ICD-10-CM

## 2014-12-04 DIAGNOSIS — K219 Gastro-esophageal reflux disease without esophagitis: Secondary | ICD-10-CM

## 2014-12-04 DIAGNOSIS — G629 Polyneuropathy, unspecified: Secondary | ICD-10-CM

## 2014-12-04 DIAGNOSIS — R630 Anorexia: Secondary | ICD-10-CM

## 2014-12-04 DIAGNOSIS — E876 Hypokalemia: Secondary | ICD-10-CM

## 2014-12-04 LAB — CBC WITH DIFFERENTIAL/PLATELET
Basophils Absolute: 0 10*3/uL (ref 0–0.1)
Basophils Relative: 0 %
EOS PCT: 2 %
Eosinophils Absolute: 0.1 10*3/uL (ref 0–0.7)
HCT: 40.1 % (ref 35.0–47.0)
Hemoglobin: 13.5 g/dL (ref 12.0–16.0)
LYMPHS ABS: 3.1 10*3/uL (ref 1.0–3.6)
LYMPHS PCT: 36 %
MCH: 29.8 pg (ref 26.0–34.0)
MCHC: 33.7 g/dL (ref 32.0–36.0)
MCV: 88.4 fL (ref 80.0–100.0)
Monocytes Absolute: 0.8 10*3/uL (ref 0.2–0.9)
Monocytes Relative: 9 %
Neutro Abs: 4.5 10*3/uL (ref 1.4–6.5)
Neutrophils Relative %: 53 %
PLATELETS: 158 10*3/uL (ref 150–440)
RBC: 4.53 MIL/uL (ref 3.80–5.20)
RDW: 17.4 % — AB (ref 11.5–14.5)
WBC: 8.6 10*3/uL (ref 3.6–11.0)

## 2014-12-04 LAB — COMPREHENSIVE METABOLIC PANEL
ALT: 26 U/L (ref 14–54)
AST: 33 U/L (ref 15–41)
Albumin: 4 g/dL (ref 3.5–5.0)
Alkaline Phosphatase: 48 U/L (ref 38–126)
Anion gap: 7 (ref 5–15)
BUN: 16 mg/dL (ref 6–20)
CHLORIDE: 104 mmol/L (ref 101–111)
CO2: 26 mmol/L (ref 22–32)
CREATININE: 1.18 mg/dL — AB (ref 0.44–1.00)
Calcium: 8.7 mg/dL — ABNORMAL LOW (ref 8.9–10.3)
GFR calc Af Amer: 56 mL/min — ABNORMAL LOW (ref >60)
GFR, EST NON AFRICAN AMERICAN: 48 mL/min — AB (ref >60)
Glucose, Bld: 126 mg/dL — ABNORMAL HIGH (ref 65–99)
Potassium: 3 mmol/L — ABNORMAL LOW (ref 3.5–5.1)
SODIUM: 137 mmol/L (ref 135–145)
Total Bilirubin: 1.2 mg/dL (ref 0.3–1.2)
Total Protein: 7.4 g/dL (ref 6.5–8.1)

## 2014-12-04 LAB — MAGNESIUM: Magnesium: 1.8 mg/dL (ref 1.7–2.4)

## 2014-12-04 MED ORDER — HEPARIN SOD (PORK) LOCK FLUSH 100 UNIT/ML IV SOLN
500.0000 [IU] | Freq: Once | INTRAVENOUS | Status: AC
  Administered 2014-12-04: 500 [IU] via INTRAVENOUS
  Filled 2014-12-04: qty 5

## 2014-12-04 MED ORDER — SODIUM CHLORIDE 0.9 % IV SOLN
Freq: Once | INTRAVENOUS | Status: AC
  Administered 2014-12-04: 15:00:00 via INTRAVENOUS
  Filled 2014-12-04: qty 1000

## 2014-12-04 MED ORDER — DIPHENHYDRAMINE HCL 25 MG PO CAPS
50.0000 mg | ORAL_CAPSULE | Freq: Once | ORAL | Status: AC
  Administered 2014-12-04: 25 mg via ORAL
  Filled 2014-12-04: qty 2

## 2014-12-04 MED ORDER — KLOR-CON M20 20 MEQ PO TBCR: 20.0000 meq | EXTENDED_RELEASE_TABLET | Freq: Three times a day (TID) | ORAL | Status: DC

## 2014-12-04 MED ORDER — ACETAMINOPHEN 325 MG PO TABS
650.0000 mg | ORAL_TABLET | Freq: Once | ORAL | Status: AC
Start: 2014-12-04 — End: 2014-12-04
  Administered 2014-12-04: 650 mg via ORAL
  Filled 2014-12-04: qty 2

## 2014-12-04 MED ORDER — SODIUM CHLORIDE 0.9 % IJ SOLN
10.0000 mL | Freq: Once | INTRAMUSCULAR | Status: AC
  Administered 2014-12-04: 10 mL via INTRAVENOUS
  Filled 2014-12-04: qty 10

## 2014-12-04 MED ORDER — TRASTUZUMAB CHEMO INJECTION 440 MG
6.0000 mg/kg | Freq: Once | INTRAVENOUS | Status: AC
  Administered 2014-12-04: 588 mg via INTRAVENOUS
  Filled 2014-12-04: qty 28

## 2014-12-04 MED ORDER — KLOR-CON M20 20 MEQ PO TBCR: 20.0000 meq | EXTENDED_RELEASE_TABLET | Freq: Two times a day (BID) | ORAL | Status: DC

## 2014-12-04 NOTE — Progress Notes (Signed)
[place] @ [facility] Telephone:(336) [phone]  Fax:([phone]) [place] OB: 12/26/1950  MR#: [id number]  LGX#:[id number]  Patient Care Team: Jerrol Banana., MD as PCP - General (Family Medicine) Robert Bellow, MD as Consulting Physician (General Surgery) Nicholaus Bloom, MD as Consulting Physician (Plastic Surgery) Tammy Rondell Reams, [place] (Family Medicine)  CHIEF COMPLAINT:  Chief Complaint  Patient presents with  . OTHER   Oncology History   1.  Abnormal mammogram in April of 2016 followed by ultrasound and based on clinical examination 1 cm tumor.  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  (July 05, 2014) 2.  Status post lumpectomy and sentinel lymph node evaluation tumor size is 1.4 cm sentinel lymph nodes negative.  Stage IC T1 cN0 M0 tumor 3.  Patient is going for breast reduction surgery in June of 2016 4.  Adjuvant chemotherapy started with Taxol and Herceptin on September 04, 2014   5.  Patient received total 10 treatment of Taxol and has been discontinued course of progressing neuropathy Patient was referred to radiation therapy (November 15, 2014)   Oncology Flowsheet 10/02/2014 10/09/2014 10/16/2014 10/23/2014 10/30/2014 11/06/2014 11/15/2014  Day, Cycle Day 8, Cycle 2 Day 15, Cycle 2 Day 1, Cycle 3 Day 8, Cycle 3 Day 15, Cycle 3 Day 1, Cycle 4 -  dexamethasone (DECADRON) IV [ 10 mg ] [ 10 mg ] [ 10 mg ] [ 10 mg ] [ 10 mg ] [ 10 mg ] -  diphenhydrAMINE (BENADRYL) PO - - - - - - 50 mg  ondansetron (ZOFRAN) IV [ 8 mg ] [ 8 mg ] [ 8 mg ] [ 8 mg ] [ 8 mg ] [ 8 mg ] -  PACLitaxel (TAXOL) IV 80 mg/m2 80 mg/m2 80 mg/m2 80 mg/m2 72 mg/m2 60 mg/m2 -  trastuzumab (HERCEPTIN) IV 6 mg/kg - - 6 mg/kg - - 6 mg/kg    INTERVAL HISTORY: 64 year old lady here for continuation of chemotherapy with Taxol and Herceptin 1.4 cm tumor which is HER-2 positive.  Estrogen and progesterone  receptor negative. December 04, 2014 Patient is here to initiate next cycle of Herceptin therapy Weakness and tiredness is improved tingling numbness is improved patient has been taken off Taxol therapy after 10 cycles of Taxol chemotherapy.  Significant improvement after Taxol was discontinued.  No shortness of breath or chest pain.   REVIEW OF SYSTEMS:    general status: Patient is feeling weak and tired.  No change in a performance status.  No chills.  No fever. HEENT:   No evidence of stomatitis Lungs: No cough or shortness of breath Cardiac: No chest pain or paroxysmal nocturnal dyspnea GI:as per interval history.  New-onset of nausea vomiting diarrhea Skin: No rash Lower extremity no swelling Neurological system: No tingling.  No numbness.  No other focal signs Musculoskeletal system no bony pains . As per HPI. Otherwise, a complete review of systems is negatve.  PAST MEDICAL HISTORY: Past Medical History  Diagnosis Date  . Hypertension   . Hyperlipidemia   . GERD (gastroesophageal reflux disease)   . Depression 1995  . Cancer     skin   . 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 Complication of anesthesia 2004    oversensitive to noise, lights    PAST SURGICAL HISTORY: Past Surgical History  Procedure Laterality Date  . Back surgery    . Tonsillectomy    . Skin cancer  removal  2015    nose   . Colonoscopy  2013    Dr. Vira Agar  . Breast lumpectomy with axillary lymph node dissection Left 08/03/2014    Procedure: BREAST LUMPECTOMY WITH AXILLARY LYMPH NODE DISSECTION;  Surgeon: Robert Bellow, MD;  Location: [facility] ORS;  Service: General;  Laterality: Left;  . Sentinel node biopsy Left 08/03/2014    Procedure: SENTINEL NODE BIOPSY;  Surgeon: Robert Bellow, MD;  Location: [facility] ORS;  Service: General;  Laterality: Left;  . Axillary lymph node biopsy Left 08/03/2014    Procedure: AXILLARY LYMPH NODE BIOPSY;   Surgeon: Robert Bellow, MD;  Location: [facility] ORS;  Service: General;  Laterality: Left;  . Breast surgery Left Aug 03, 2014    Wide excision, sentinel node biopsy.  . Portacath placement Right 08/31/2014    Procedure: INSERTION PORT-A-CATH;  Surgeon: Robert Bellow, MD;  Location: [facility] ORS;  Service: General;  Laterality: Right;    FAMILY HISTORY Family History  Problem Relation Age of Onset  . Breast cancer Mother 50       . Breast cancer Cousin 62         ADVANCED DIRECTIVES:  Patient does not have any living will or healthcare power of attorney.  Information was given .  Available resources had been discussed.  We will follow-up on subsequent appointments regarding this issue  HEALTH MAINTENANCE: Social History  Substance Use Topics  . Smoking status: Former Smoker -- 1.00 packs/day for 2 years    Types: Cigarettes    Quit date: 07/30/1976  . Smokeless tobacco: None  . Alcohol Use: 0.0 - 1.2 oz/week    0-1 Glasses of wine, 0-1 Shots of liquor, 0 Standard drinks or equivalent per week      Allergies  Allergen Reactions  . Sulfa Antibiotics Hives and Rash    Current Outpatient Prescriptions  Medication Sig Dispense Refill  . chlorpheniramine-HYDROcodone (TUSSIONEX) 10-8 MG/5ML SUER Take 5 mLs by mouth every 12 (twelve) hours as needed for cough. 140 mL 0  . HYDROcodone-acetaminophen (NORCO) 5-325 MG per tablet Take 1-2 tablets by mouth every 4 (four) hours as needed for moderate pain or severe pain. 30 tablet 0  . KLOR-CON M20 20 MEQ tablet Take 1 tablet (20 mEq total) by mouth every morning. 30 tablet 0  . lidocaine-prilocaine (EMLA) cream Apply 1 application topically as needed. 30 g 3  . omeprazole (PRILOSEC) 40 MG capsule Take 40 mg by mouth every morning.   4  . ondansetron (ZOFRAN) 8 MG tablet Take 1 tablet (8 mg total) by mouth 2 (two) times daily. Start the day after chemo for 2 days. Then take as needed for nausea or vomiting. 30 tablet 1  . pravastatin  (PRAVACHOL) 40 MG tablet Take 40 mg by mouth every morning.   3  . sertraline (ZOLOFT) 100 MG tablet Take 100 mg by mouth every morning.   4  . triamterene-hydrochlorothiazide (MAXZIDE) 75-50 MG per tablet Take 1 tablet by mouth every morning.   4   No current facility-administered medications for this visit.   Facility-Administered Medications Ordered in Other Visits  Medication Dose Route Frequency Provider Last Rate Last Dose  . acetaminophen (TYLENOL) tablet 650 mg  650 mg Oral Once Forest Gleason, MD      . diphenhydrAMINE (BENADRYL) capsule 50 mg  50 mg Oral Once Forest Gleason, MD      .  heparin lock flush 100 unit/mL  500 Units Intravenous Once Forest Gleason, MD      . sodium chloride 0.9 % injection 10 mL  10 mL Intravenous PRN Forest Gleason, MD   10 mL at 09/25/14 1418    OBJECTIVE:  Filed Vitals:   12/04/14 1358  BP: 132/84  Pulse: 97  Temp: 97 F (36.1 C)     Body mass index is 36.94 kg/(m^2).    ECOG FS:0 - Asymptomatic  PHYSICAL EXAM: General  status: Performance status is good.  Patient has not lost significant weight HEENT: No evidence of stomatitis. Sclera and conjunctivae :: No jaundice.   pale looking. Lungs: Air  entry equal on both sides.  No rhonchi.  No rales.  Cardiac: Heart sounds are normal.  No pericardial rub.  No murmur. Lymphatic system: Cervical, axillary, inguinal, lymph nodes not palpable GI: Abdomen is soft.  No ascites.  Liver spleen not palpable.  No tenderness.  Bowel sounds are within normal limit Lower extremity: No edema Neurological system: Higher functions, cranial nerves intact . 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  No ecchymosis.. Bilateral breast mammoplasty  Port site within normal limit healing well. Alopecia. No stomatitis   LAB RESULTS:  Infusion on 12/04/2014  Component Date Value Ref Range Status  . Sodium 12/04/2014 137  135 - 145 mmol/L Final  . Potassium 12/04/2014 3.0* 3.5 - 5.1  mmol/L Final  . Chloride 12/04/2014 104  101 - 111 mmol/L Final  . CO2 12/04/2014 26  22 - 32 mmol/L Final  . Glucose, Bld 12/04/2014 126* 65 - 99 mg/dL Final  . BUN 12/04/2014 16  6 - 20 mg/dL Final  . Creatinine, Ser 12/04/2014 1.18* 0.44 - 1.00 mg/dL Final  . Calcium 12/04/2014 8.7* 8.9 - 10.3 mg/dL Final  . Total Protein 12/04/2014 7.4  6.5 - 8.1 g/dL Final  . Albumin 12/04/2014 4.0  3.5 - 5.0 g/dL Final  . AST 12/04/2014 33  15 - 41 U/L Final  . ALT 12/04/2014 26  14 - 54 U/L Final  . Alkaline Phosphatase 12/04/2014 48  38 - 126 U/L Final  . Total Bilirubin 12/04/2014 1.2  0.3 - 1.2 mg/dL Final  . GFR calc non Af Amer 12/04/2014 48* >60 mL/min Final  . GFR calc Af Amer 12/04/2014 56* >60 mL/min Final   Comment: (NOTE) The eGFR has been calculated using the CKD EPI equation. This calculation has not been validated in all clinical situations. eGFR's persistently <60 mL/min signify possible Chronic Kidney Disease.   . Anion gap 12/04/2014 7  5 - 15 Final  . Magnesium 12/04/2014 1.8  1.7 - 2.4 mg/dL Final  . WBC 12/04/2014 8.6  3.6 - 11.0 K/uL Final  . RBC 12/04/2014 4.53  3.80 - 5.20 MIL/uL Final  . Hemoglobin 12/04/2014 13.5  12.0 - 16.0 g/dL Final  . HCT 12/04/2014 40.1  35.0 - 47.0 % Final  . MCV 12/04/2014 88.4  80.0 - 100.0 fL Final  . MCH 12/04/2014 29.8  26.0 - 34.0 pg Final  . MCHC 12/04/2014 33.7  32.0 - 36.0 g/dL Final  . RDW 12/04/2014 17.4* 11.5 - 14.5 % Final  . Platelets 12/04/2014 158  150 - 440 K/uL Final  . Neutrophils Relative % 12/04/2014 53   Final  . Neutro Abs 12/04/2014 4.5  1.4 - 6.5 K/uL Final  . Lymphocytes Relative 12/04/2014 36   Final  . Lymphs Abs 12/04/2014 3.1  1.0 -  3.6 K/uL Final  . Monocytes Relative 12/04/2014 9   Final  . Monocytes Absolute 12/04/2014 0.8  0.2 - 0.9 K/uL Final  . Eosinophils Relative 12/04/2014 2   Final  . Eosinophils Absolute 12/04/2014 0.1  0 - 0.7 K/uL Final  . Basophils Relative 12/04/2014 0   Final  . Basophils  Absolute 12/04/2014 0.0  0 - 0.1 K/uL Final       ASSESSMENT: Carcinoma of breast status post lumpectomy and sentinel lymph node (left breast) stage IC Estrogen and progesterone receptor negative HER-2/neu positive tumor Status post mammoplasty on both breast All lab data has been reviewed. Continue Herceptin therapy. Repeat MUGA scan of the heart after next treatment  MEDICAL DECISION MAKING:   Continue Herceptin All lab data has been reviewed    Patient expressed understanding and was in agreement with this plan. She also understands that She can call clinic at any time with any questions, concerns, or complaints.    Carcinoma of left breast   Staging form: Breast, AJCC 7th Edition     Clinical: Stage IA (T1c, N0, M0) - Unsigned   Forest Gleason, MD   12/04/2014 2:30 PM

## 2014-12-04 NOTE — Progress Notes (Signed)
Patient does have living will.  Former smoker. 

## 2014-12-04 NOTE — Addendum Note (Signed)
Addended by: Telford Nab on: 12/04/2014 03:21 PM   Modules accepted: Orders

## 2014-12-05 ENCOUNTER — Ambulatory Visit
Admission: RE | Admit: 2014-12-05 | Discharge: 2014-12-05 | Disposition: A | Discharge status: Home / Self Care | Payer: BLUE CROSS/BLUE SHIELD | Source: Ambulatory Visit | Attending: Radiation Oncology | Admitting: Radiation Oncology

## 2014-12-05 ENCOUNTER — Inpatient Hospital Stay: Payer: BLUE CROSS/BLUE SHIELD

## 2014-12-05 DIAGNOSIS — C50912 Malignant neoplasm of unspecified site of left female breast: Secondary | ICD-10-CM | POA: Diagnosis not present

## 2014-12-05 DIAGNOSIS — Z510 Encounter for antineoplastic radiation therapy: Secondary | ICD-10-CM | POA: Diagnosis not present

## 2014-12-05 DIAGNOSIS — Z87891 Personal history of nicotine dependence: Secondary | ICD-10-CM | POA: Diagnosis not present

## 2014-12-05 DIAGNOSIS — Z171 Estrogen receptor negative status [ER-]: Secondary | ICD-10-CM | POA: Diagnosis not present

## 2014-12-05 MED ORDER — SODIUM CHLORIDE 0.9 % IV SOLN
20.0000 meq | Freq: Once | INTRAVENOUS | Status: AC
  Administered 2014-12-05: 20 meq via INTRAVENOUS
  Filled 2014-12-05: qty 10

## 2014-12-05 MED ORDER — SODIUM CHLORIDE 0.9 % IV SOLN: Freq: Once | INTRAVENOUS | Status: DC

## 2014-12-05 MED ORDER — HEPARIN SOD (PORK) LOCK FLUSH 100 UNIT/ML IV SOLN
INTRAVENOUS | Status: AC
  Filled 2014-12-05: qty 5

## 2014-12-05 MED ORDER — SODIUM CHLORIDE 0.9 % IJ SOLN
10.0000 mL | INTRAMUSCULAR | Status: DC | PRN
  Administered 2014-12-05: 10 mL via INTRAVENOUS
  Filled 2014-12-05: qty 10

## 2014-12-05 MED ORDER — HEPARIN SOD (PORK) LOCK FLUSH 100 UNIT/ML IV SOLN
500.0000 [IU] | Freq: Once | INTRAVENOUS | Status: AC
Start: 2014-12-05 — End: 2014-12-05
  Administered 2014-12-05: 500 [IU] via INTRAVENOUS

## 2014-12-08 ENCOUNTER — Other Ambulatory Visit: Payer: Self-pay | Admitting: *Deleted

## 2014-12-08 DIAGNOSIS — C50912 Malignant neoplasm of unspecified site of left female breast: Secondary | ICD-10-CM

## 2014-12-11 ENCOUNTER — Other Ambulatory Visit: Payer: Self-pay | Admitting: *Deleted

## 2014-12-11 ENCOUNTER — Inpatient Hospital Stay: Payer: BLUE CROSS/BLUE SHIELD | Attending: Oncology

## 2014-12-11 DIAGNOSIS — I10 Essential (primary) hypertension: Secondary | ICD-10-CM | POA: Insufficient documentation

## 2014-12-11 DIAGNOSIS — Z5111 Encounter for antineoplastic chemotherapy: Secondary | ICD-10-CM | POA: Insufficient documentation

## 2014-12-11 DIAGNOSIS — R531 Weakness: Secondary | ICD-10-CM | POA: Insufficient documentation

## 2014-12-11 DIAGNOSIS — G629 Polyneuropathy, unspecified: Secondary | ICD-10-CM | POA: Diagnosis not present

## 2014-12-11 DIAGNOSIS — E785 Hyperlipidemia, unspecified: Secondary | ICD-10-CM | POA: Insufficient documentation

## 2014-12-11 DIAGNOSIS — Z171 Estrogen receptor negative status [ER-]: Secondary | ICD-10-CM | POA: Insufficient documentation

## 2014-12-11 DIAGNOSIS — C50912 Malignant neoplasm of unspecified site of left female breast: Secondary | ICD-10-CM

## 2014-12-11 DIAGNOSIS — F329 Major depressive disorder, single episode, unspecified: Secondary | ICD-10-CM | POA: Insufficient documentation

## 2014-12-11 DIAGNOSIS — K219 Gastro-esophageal reflux disease without esophagitis: Secondary | ICD-10-CM | POA: Diagnosis not present

## 2014-12-11 DIAGNOSIS — Z79899 Other long term (current) drug therapy: Secondary | ICD-10-CM | POA: Insufficient documentation

## 2014-12-11 DIAGNOSIS — D649 Anemia, unspecified: Secondary | ICD-10-CM | POA: Insufficient documentation

## 2014-12-11 DIAGNOSIS — R5383 Other fatigue: Secondary | ICD-10-CM | POA: Diagnosis not present

## 2014-12-11 DIAGNOSIS — Z87891 Personal history of nicotine dependence: Secondary | ICD-10-CM | POA: Diagnosis not present

## 2014-12-11 LAB — CBC WITH DIFFERENTIAL/PLATELET
BASOS ABS: 0.1 10*3/uL (ref 0–0.1)
BASOS PCT: 1 %
EOS ABS: 0.1 10*3/uL (ref 0–0.7)
Eosinophils Relative: 1 %
HCT: 40.3 % (ref 35.0–47.0)
Hemoglobin: 13.7 g/dL (ref 12.0–16.0)
Lymphocytes Relative: 38 %
Lymphs Abs: 3.2 10*3/uL (ref 1.0–3.6)
MCH: 29.7 pg (ref 26.0–34.0)
MCHC: 34 g/dL (ref 32.0–36.0)
MCV: 87.5 fL (ref 80.0–100.0)
MONO ABS: 0.6 10*3/uL (ref 0.2–0.9)
MONOS PCT: 7 %
NEUTROS PCT: 53 %
Neutro Abs: 4.4 10*3/uL (ref 1.4–6.5)
PLATELETS: 189 10*3/uL (ref 150–440)
RBC: 4.61 MIL/uL (ref 3.80–5.20)
RDW: 16.8 % — AB (ref 11.5–14.5)
WBC: 8.4 10*3/uL (ref 3.6–11.0)

## 2014-12-11 LAB — POTASSIUM: POTASSIUM: 3.5 mmol/L (ref 3.5–5.1)

## 2014-12-12 ENCOUNTER — Ambulatory Visit
Admission: RE | Admit: 2014-12-12 | Discharge: 2014-12-12 | Disposition: A | Discharge status: Home / Self Care | Payer: BLUE CROSS/BLUE SHIELD | Source: Ambulatory Visit | Attending: Radiation Oncology | Admitting: Radiation Oncology

## 2014-12-12 DIAGNOSIS — C50912 Malignant neoplasm of unspecified site of left female breast: Secondary | ICD-10-CM | POA: Diagnosis not present

## 2014-12-13 ENCOUNTER — Ambulatory Visit
Admission: RE | Admit: 2014-12-13 | Discharge: 2014-12-13 | Disposition: A | Discharge status: Home / Self Care | Payer: BLUE CROSS/BLUE SHIELD | Source: Ambulatory Visit | Attending: Radiation Oncology | Admitting: Radiation Oncology

## 2014-12-13 DIAGNOSIS — C50912 Malignant neoplasm of unspecified site of left female breast: Secondary | ICD-10-CM | POA: Diagnosis not present

## 2014-12-14 ENCOUNTER — Ambulatory Visit: Payer: BLUE CROSS/BLUE SHIELD

## 2014-12-15 ENCOUNTER — Ambulatory Visit
Admission: RE | Admit: 2014-12-15 | Discharge: 2014-12-15 | Disposition: A | Discharge status: Home / Self Care | Payer: BLUE CROSS/BLUE SHIELD | Source: Ambulatory Visit | Attending: Radiation Oncology | Admitting: Radiation Oncology

## 2014-12-15 DIAGNOSIS — C50912 Malignant neoplasm of unspecified site of left female breast: Secondary | ICD-10-CM | POA: Diagnosis not present

## 2014-12-17 ENCOUNTER — Other Ambulatory Visit: Payer: Self-pay | Admitting: Hematology and Oncology

## 2014-12-18 ENCOUNTER — Ambulatory Visit
Admission: RE | Admit: 2014-12-18 | Discharge: 2014-12-18 | Disposition: A | Discharge status: Home / Self Care | Payer: BLUE CROSS/BLUE SHIELD | Source: Ambulatory Visit | Attending: Radiation Oncology | Admitting: Radiation Oncology

## 2014-12-18 DIAGNOSIS — C50912 Malignant neoplasm of unspecified site of left female breast: Secondary | ICD-10-CM | POA: Diagnosis not present

## 2014-12-19 ENCOUNTER — Telehealth: Payer: Self-pay | Admitting: *Deleted

## 2014-12-19 ENCOUNTER — Inpatient Hospital Stay: Payer: BLUE CROSS/BLUE SHIELD

## 2014-12-19 ENCOUNTER — Ambulatory Visit
Admission: RE | Admit: 2014-12-19 | Discharge: 2014-12-19 | Disposition: A | Discharge status: Home / Self Care | Payer: BLUE CROSS/BLUE SHIELD | Source: Ambulatory Visit | Attending: Radiation Oncology | Admitting: Radiation Oncology

## 2014-12-19 ENCOUNTER — Other Ambulatory Visit: Payer: Self-pay | Admitting: *Deleted

## 2014-12-19 DIAGNOSIS — C50912 Malignant neoplasm of unspecified site of left female breast: Secondary | ICD-10-CM

## 2014-12-19 LAB — CBC WITH DIFFERENTIAL/PLATELET
BASOS ABS: 0.1 10*3/uL (ref 0–0.1)
BASOS PCT: 1 %
Eosinophils Absolute: 0.1 10*3/uL (ref 0–0.7)
Eosinophils Relative: 2 %
HEMATOCRIT: 41.2 % (ref 35.0–47.0)
HEMOGLOBIN: 13.9 g/dL (ref 12.0–16.0)
Lymphocytes Relative: 33 %
Lymphs Abs: 2.8 10*3/uL (ref 1.0–3.6)
MCH: 29.6 pg (ref 26.0–34.0)
MCHC: 33.8 g/dL (ref 32.0–36.0)
MCV: 87.7 fL (ref 80.0–100.0)
MONOS PCT: 8 %
Monocytes Absolute: 0.7 10*3/uL (ref 0.2–0.9)
NEUTROS ABS: 4.7 10*3/uL (ref 1.4–6.5)
NEUTROS PCT: 56 %
Platelets: 192 10*3/uL (ref 150–440)
RBC: 4.7 MIL/uL (ref 3.80–5.20)
RDW: 15.7 % — ABNORMAL HIGH (ref 11.5–14.5)
WBC: 8.4 10*3/uL (ref 3.6–11.0)

## 2014-12-19 LAB — POTASSIUM: Potassium: 3.4 mmol/L — ABNORMAL LOW (ref 3.5–5.1)

## 2014-12-19 MED ORDER — KLOR-CON M20 20 MEQ PO TBCR: 40.0000 meq | EXTENDED_RELEASE_TABLET | Freq: Two times a day (BID) | ORAL | Status: DC

## 2014-12-19 NOTE — Telephone Encounter (Signed)
Potassium 3.4 today. Per Magda Paganini, NP pt to increase potassium to 2 tabs in the morning and 2 tabs in the evening. Instructed pt of NP recommendations. Pt verbalized understanding.

## 2014-12-20 ENCOUNTER — Ambulatory Visit
Admission: RE | Admit: 2014-12-20 | Discharge: 2014-12-20 | Disposition: A | Discharge status: Home / Self Care | Payer: BLUE CROSS/BLUE SHIELD | Source: Ambulatory Visit | Attending: Radiation Oncology | Admitting: Radiation Oncology

## 2014-12-20 ENCOUNTER — Inpatient Hospital Stay: Payer: BLUE CROSS/BLUE SHIELD

## 2014-12-20 DIAGNOSIS — C50912 Malignant neoplasm of unspecified site of left female breast: Secondary | ICD-10-CM | POA: Diagnosis not present

## 2014-12-21 ENCOUNTER — Ambulatory Visit
Admission: RE | Admit: 2014-12-21 | Discharge: 2014-12-21 | Disposition: A | Discharge status: Home / Self Care | Payer: BLUE CROSS/BLUE SHIELD | Source: Ambulatory Visit | Attending: Radiation Oncology | Admitting: Radiation Oncology

## 2014-12-21 DIAGNOSIS — C50912 Malignant neoplasm of unspecified site of left female breast: Secondary | ICD-10-CM | POA: Diagnosis not present

## 2014-12-22 ENCOUNTER — Ambulatory Visit
Admission: RE | Admit: 2014-12-22 | Discharge: 2014-12-22 | Disposition: A | Discharge status: Home / Self Care | Payer: BLUE CROSS/BLUE SHIELD | Source: Ambulatory Visit | Attending: Radiation Oncology | Admitting: Radiation Oncology

## 2014-12-22 DIAGNOSIS — C50912 Malignant neoplasm of unspecified site of left female breast: Secondary | ICD-10-CM | POA: Diagnosis not present

## 2014-12-25 ENCOUNTER — Inpatient Hospital Stay: Payer: BLUE CROSS/BLUE SHIELD

## 2014-12-25 ENCOUNTER — Ambulatory Visit
Admission: RE | Admit: 2014-12-25 | Discharge: 2014-12-25 | Disposition: A | Discharge status: Home / Self Care | Payer: BLUE CROSS/BLUE SHIELD | Source: Ambulatory Visit | Attending: Radiation Oncology | Admitting: Radiation Oncology

## 2014-12-25 ENCOUNTER — Inpatient Hospital Stay (HOSPITAL_BASED_OUTPATIENT_CLINIC_OR_DEPARTMENT_OTHER): Payer: BLUE CROSS/BLUE SHIELD | Admitting: Oncology

## 2014-12-25 ENCOUNTER — Encounter: Payer: Self-pay | Admitting: Oncology

## 2014-12-25 VITALS — BP 136/73 | HR 84 | Temp 94.5°F | Wt 222.9 lb

## 2014-12-25 DIAGNOSIS — C801 Malignant (primary) neoplasm, unspecified: Secondary | ICD-10-CM

## 2014-12-25 DIAGNOSIS — R5383 Other fatigue: Secondary | ICD-10-CM

## 2014-12-25 DIAGNOSIS — D649 Anemia, unspecified: Secondary | ICD-10-CM

## 2014-12-25 DIAGNOSIS — E785 Hyperlipidemia, unspecified: Secondary | ICD-10-CM

## 2014-12-25 DIAGNOSIS — Z79899 Other long term (current) drug therapy: Secondary | ICD-10-CM

## 2014-12-25 DIAGNOSIS — G629 Polyneuropathy, unspecified: Secondary | ICD-10-CM | POA: Diagnosis not present

## 2014-12-25 DIAGNOSIS — C50912 Malignant neoplasm of unspecified site of left female breast: Secondary | ICD-10-CM | POA: Diagnosis not present

## 2014-12-25 DIAGNOSIS — Z171 Estrogen receptor negative status [ER-]: Secondary | ICD-10-CM

## 2014-12-25 DIAGNOSIS — Z87891 Personal history of nicotine dependence: Secondary | ICD-10-CM

## 2014-12-25 DIAGNOSIS — R531 Weakness: Secondary | ICD-10-CM

## 2014-12-25 DIAGNOSIS — K219 Gastro-esophageal reflux disease without esophagitis: Secondary | ICD-10-CM

## 2014-12-25 DIAGNOSIS — F329 Major depressive disorder, single episode, unspecified: Secondary | ICD-10-CM

## 2014-12-25 DIAGNOSIS — I10 Essential (primary) hypertension: Secondary | ICD-10-CM

## 2014-12-25 LAB — BASIC METABOLIC PANEL
ANION GAP: 6 (ref 5–15)
BUN: 16 mg/dL (ref 6–20)
CO2: 25 mmol/L (ref 22–32)
Calcium: 8.4 mg/dL — ABNORMAL LOW (ref 8.9–10.3)
Chloride: 106 mmol/L (ref 101–111)
Creatinine, Ser: 1.18 mg/dL — ABNORMAL HIGH (ref 0.44–1.00)
GFR, EST AFRICAN AMERICAN: 55 mL/min — AB (ref >60)
GFR, EST NON AFRICAN AMERICAN: 48 mL/min — AB (ref >60)
GLUCOSE: 110 mg/dL — AB (ref 65–99)
POTASSIUM: 3.5 mmol/L (ref 3.5–5.1)
SODIUM: 137 mmol/L (ref 135–145)

## 2014-12-25 LAB — CBC
HEMATOCRIT: 39.2 % (ref 35.0–47.0)
HEMOGLOBIN: 13 g/dL (ref 12.0–16.0)
MCH: 29.4 pg (ref 26.0–34.0)
MCHC: 33.2 g/dL (ref 32.0–36.0)
MCV: 88.4 fL (ref 80.0–100.0)
Platelets: 159 10*3/uL (ref 150–440)
RBC: 4.43 MIL/uL (ref 3.80–5.20)
RDW: 15.3 % — ABNORMAL HIGH (ref 11.5–14.5)
WBC: 7.6 10*3/uL (ref 3.6–11.0)

## 2014-12-25 MED ORDER — SODIUM CHLORIDE 0.9 % IV SOLN
6.0000 mg/kg | Freq: Once | INTRAVENOUS | Status: AC
  Administered 2014-12-25: 588 mg via INTRAVENOUS
  Filled 2014-12-25: qty 28

## 2014-12-25 MED ORDER — SODIUM CHLORIDE 0.9 % IJ SOLN
10.0000 mL | INTRAMUSCULAR | Status: DC | PRN
  Administered 2014-12-25: 10 mL
  Filled 2014-12-25: qty 10

## 2014-12-25 MED ORDER — ACETAMINOPHEN 325 MG PO TABS
650.0000 mg | ORAL_TABLET | Freq: Once | ORAL | Status: AC
  Administered 2014-12-25: 650 mg via ORAL
  Filled 2014-12-25: qty 2

## 2014-12-25 MED ORDER — HEPARIN SOD (PORK) LOCK FLUSH 100 UNIT/ML IV SOLN
500.0000 [IU] | Freq: Once | INTRAVENOUS | Status: AC | PRN
  Administered 2014-12-25: 500 [IU]
  Filled 2014-12-25: qty 5

## 2014-12-25 MED ORDER — DIPHENHYDRAMINE HCL 25 MG PO CAPS: 50.0000 mg | ORAL_CAPSULE | Freq: Once | ORAL | Status: DC

## 2014-12-25 MED ORDER — SODIUM CHLORIDE 0.9 % IV SOLN
Freq: Once | INTRAVENOUS | Status: AC
  Administered 2014-12-25: 15:00:00 via INTRAVENOUS
  Filled 2014-12-25: qty 1000

## 2014-12-25 NOTE — Progress Notes (Signed)
[place] @ [facility] Telephone:(336) [phone]  Fax:(336) [place]: 10-04-1950  MR#: [id number]  UYQ#:[id number]  Patient Care Team: Jerrol Banana., MD as PCP - General (Family Medicine) Robert Bellow, MD as Consulting Physician (General Surgery) Nicholaus Bloom, MD as Consulting Physician (Plastic Surgery) Tammy Rondell Reams, [place] (Family Medicine)  CHIEF COMPLAINT:  Chief Complaint  Patient presents with  . OTHER   Oncology History   1.  Abnormal mammogram in April of 2016 followed by ultrasound and based on clinical examination 1 cm tumor.  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  (July 05, 2014) 2.  Status post lumpectomy and sentinel lymph node evaluation tumor size is 1.4 cm sentinel lymph nodes negative.  Stage IC T1 cN0 M0 tumor 3.  Patient is going for breast reduction surgery in June of 2016 4.  Adjuvant chemotherapy started with Taxol and Herceptin on September 04, 2014   5.  Patient received total 10 treatment of Taxol and has been discontinued course of progressing neuropathy Patient was referred to radiation therapy (November 15, 2014)   Oncology Flowsheet 10/09/2014 10/16/2014 10/23/2014 10/30/2014 11/06/2014 11/15/2014 12/04/2014  Day, Cycle Day 15, Cycle 2 Day 1, Cycle 3 Day 8, Cycle 3 Day 15, Cycle 3 Day 1, Cycle 4 - -  dexamethasone (DECADRON) IV [ 10 mg ] [ 10 mg ] [ 10 mg ] [ 10 mg ] [ 10 mg ] - -  diphenhydrAMINE (BENADRYL) PO - - - - - 50 mg 25 mg  ondansetron (ZOFRAN) IV [ 8 mg ] [ 8 mg ] [ 8 mg ] [ 8 mg ] [ 8 mg ] - -  PACLitaxel (TAXOL) IV 80 mg/m2 80 mg/m2 80 mg/m2 72 mg/m2 60 mg/m2 - -  trastuzumab (HERCEPTIN) IV - - 6 mg/kg - - 6 mg/kg 6 mg/kg    INTERVAL HISTORY: 64 year old lady here for continuation of chemotherapy with Taxol and Herceptin 1.4 cm tumor which is HER-2 positive.  Estrogen and progesterone receptor negative. Patient is  here for ongoing evaluation for ongoing evaluation and treatment consideration. Patient started radiation therapy tolerating treatment very well. Feeling somewhat stronger after Taxol was discontinued because of progressing side effect with anemia, neuropathy .   REVIEW OF SYSTEMS:    general status: Patient is feeling weak and tired.  No change in a performance status.  No chills.  No fever. HEENT:   No evidence of stomatitis Lungs: No cough or shortness of breath Cardiac: No chest pain or paroxysmal nocturnal dyspnea GI:as per interval history.  New-onset of nausea vomiting diarrhea Skin: No rash Lower extremity no swelling Neurological system: No tingling.  No numbness.  No other focal signs Musculoskeletal system no bony pains . As per HPI. Otherwise, a complete review of systems is negatve.  PAST MEDICAL HISTORY: Past Medical History  Diagnosis Date  . Hypertension   . Hyperlipidemia   . GERD (gastroesophageal reflux disease)   . Depression 1995  . Cancer ([place])     skin   . Carcinoma of left breast ([place]) 07/30/2014    1.4 cm, T1c, N0, ER negative, PR negative, HER-2 positive  . Complication of anesthesia 2004    oversensitive to noise, lights    PAST SURGICAL HISTORY: Past Surgical History  Procedure Laterality Date  . Back surgery    . Tonsillectomy    . Skin cancer  removal  2015    nose   . Colonoscopy  2013    Dr. Vira Agar  . Breast lumpectomy with axillary lymph node dissection Left 08/03/2014    Procedure: BREAST LUMPECTOMY WITH AXILLARY LYMPH NODE DISSECTION;  Surgeon: Robert Bellow, MD;  Location: [facility] ORS;  Service: General;  Laterality: Left;  . Sentinel node biopsy Left 08/03/2014    Procedure: SENTINEL NODE BIOPSY;  Surgeon: Robert Bellow, MD;  Location: [facility] ORS;  Service: General;  Laterality: Left;  . Axillary lymph node biopsy Left 08/03/2014    Procedure: AXILLARY LYMPH NODE BIOPSY;  Surgeon: Robert Bellow, MD;  Location: [facility] OR[facility];   Service: General;  Laterality: Left;  . Breast surgery Left Aug 03, 2014    Wide excision, sentinel node biopsy.  . Portacath placement Right 08/31/2014    Procedure: INSERTION PORT-A-CATH;  Surgeon: Robert Bellow, MD;  Location: [facility] ORS;  Service: General;  Laterality: Right;    FAMILY HISTORY Family History  Problem Relation Age of Onset  . Breast cancer Mother 60       . Breast cancer Cousin 62         ADVANCED DIRECTIVES:  Patient does not have any living will or healthcare power of attorney.  Information was given .  Available resources had been discussed.  We will follow-up on subsequent appointments regarding this issue  HEALTH MAINTENANCE: Social History  Substance Use Topics  . Smoking status: Former Smoker -- 1.00 packs/day for 2 years    Types: Cigarettes    Quit date: 07/30/1976  . Smokeless tobacco: None  . Alcohol Use: 0.0 - 1.2 oz/week    0-1 Glasses of wine, 0-1 Shots of liquor, 0 Standard drinks or equivalent per week      Allergies  Allergen Reactions  . Sulfa Antibiotics Hives and Rash    Current Outpatient Prescriptions  Medication Sig Dispense Refill  . chlorpheniramine-HYDROcodone (TUSSIONEX) 10-8 MG/5ML SUER Take 5 mLs by mouth every 12 (twelve) hours as needed for cough. 140 mL 0  . HYDROcodone-acetaminophen (NORCO) 5-325 MG per tablet Take 1-2 tablets by mouth every 4 (four) hours as needed for moderate pain or severe pain. 30 tablet 0  . KLOR-CON M20 20 MEQ tablet Take 2 tablets (40 mEq total) by mouth 2 (two) times daily. 90 tablet 3  . lidocaine-prilocaine (EMLA) cream Apply 1 application topically as needed. 30 g 3  . omeprazole (PRILOSEC) 40 MG capsule Take 40 mg by mouth every morning.   4  . ondansetron (ZOFRAN) 8 MG tablet Take 1 tablet (8 mg total) by mouth 2 (two) times daily. Start the day after chemo for 2 days. Then take as needed for nausea or vomiting. 30 tablet 1  . pravastatin (PRAVACHOL) 40 MG tablet Take 40 mg by mouth every  morning.   3  . sertraline (ZOLOFT) 100 MG tablet Take 100 mg by mouth every morning.   4  . triamterene-hydrochlorothiazide (MAXZIDE) 75-50 MG per tablet Take 1 tablet by mouth every morning.   4   No current facility-administered medications for this visit.   Facility-Administered Medications Ordered in Other Visits  Medication Dose Route Frequency Provider Last Rate Last Dose  . acetaminophen (TYLENOL) tablet 650 mg  650 mg Oral Once Forest Gleason, MD      . diphenhydrAMINE (BENADRYL) capsule 50 mg  50 mg Oral Once Forest Gleason, MD      . sodium chloride 0.9 % injection 10 mL  10 mL Intravenous PRN Delorise Shiner  Viraaj Vorndran, MD   10 mL at 09/25/14 1418  . sodium chloride 0.9 % injection 10 mL  10 mL Intracatheter PRN Forest Gleason, MD   10 mL at 12/25/14 1313    OBJECTIVE:  Filed Vitals:   12/25/14 1359  BP: 136/73  Pulse: 84  Temp: 94.5 F (34.7 C)     Body mass index is 37.09 kg/(m^2).    ECOG FS:0 - Asymptomatic  PHYSICAL EXAM: General  status: Performance status is good.  Patient has not lost significant weight HEENT: No evidence of stomatitis. Sclera and conjunctivae :: No jaundice.   pale looking. Lungs: Air  entry equal on both sides.  No rhonchi.  No rales.  Cardiac: Heart sounds are normal.  No pericardial rub.  No murmur. Lymphatic system: Cervical, axillary, inguinal, lymph nodes not palpable GI: Abdomen is soft.  No ascites.  Liver spleen not palpable.  No tenderness.  Bowel sounds are within normal limit Lower extremity: No edema Neurological system: Higher functions, cranial nerves intact . 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  No ecchymosis.. Bilateral breast mammoplasty  Tolerating radiation therapy without any redness or skin changes Port site within normal limit healing well. Alopecia. No stomatitis   LAB RESULTS:  Infusion on 12/25/2014  Component Date Value Ref Range Status  . Sodium 12/25/2014 137  135 - 145 mmol/L Final    . Potassium 12/25/2014 3.5  3.5 - 5.1 mmol/L Final  . Chloride 12/25/2014 106  101 - 111 mmol/L Final  . CO2 12/25/2014 25  22 - 32 mmol/L Final  . Glucose, Bld 12/25/2014 110* 65 - 99 mg/dL Final  . BUN 12/25/2014 16  6 - 20 mg/dL Final  . Creatinine, Ser 12/25/2014 1.18* 0.44 - 1.00 mg/dL Final  . Calcium 12/25/2014 8.4* 8.9 - 10.3 mg/dL Final  . GFR calc non Af Amer 12/25/2014 48* >60 mL/min Final  . GFR calc Af Amer 12/25/2014 55* >60 mL/min Final   Comment: (NOTE) The eGFR has been calculated using the CKD EPI equation. This calculation has not been validated in all clinical situations. eGFR's persistently <60 mL/min signify possible Chronic Kidney Disease.   . Anion gap 12/25/2014 6  5 - 15 Final  . WBC 12/25/2014 7.6  3.6 - 11.0 K/uL Final  . RBC 12/25/2014 4.43  3.80 - 5.20 MIL/uL Final  . Hemoglobin 12/25/2014 13.0  12.0 - 16.0 g/dL Final  . HCT 12/25/2014 39.2  35.0 - 47.0 % Final  . MCV 12/25/2014 88.4  80.0 - 100.0 fL Final  . MCH 12/25/2014 29.4  26.0 - 34.0 pg Final  . MCHC 12/25/2014 33.2  32.0 - 36.0 g/dL Final  . RDW 12/25/2014 15.3* 11.5 - 14.5 % Final  . Platelets 12/25/2014 159  150 - 440 K/uL Final       ASSESSMENT: Carcinoma of breast status post lumpectomy and sentinel lymph node (left breast) stage IC Estrogen and progesterone receptor negative HER-2/neu positive tumor Status post mammoplasty on both breast All lab data has been reviewed. Continue Herceptin therapy. Repeat MUGA scan of the heart prior to next appointment Continue Herceptin therapy   MEDICAL DECISION MAKING:   Continue Herceptin All lab data has been reviewed    Patient expressed understanding and was in agreement with this plan. She also understands that She can call clinic at any time with any questions, concerns, or complaints.    Carcinoma of left breast   Staging form: Breast, AJCC 7th Edition  Clinical: Stage IA (T1c, N0, M0) - Unsigned   Forest Gleason, MD    12/25/2014 2:15 PM

## 2014-12-26 ENCOUNTER — Ambulatory Visit: Payer: BLUE CROSS/BLUE SHIELD

## 2014-12-27 ENCOUNTER — Ambulatory Visit
Admission: RE | Admit: 2014-12-27 | Discharge: 2014-12-27 | Disposition: A | Discharge status: Home / Self Care | Payer: BLUE CROSS/BLUE SHIELD | Source: Ambulatory Visit | Attending: Radiation Oncology | Admitting: Radiation Oncology

## 2014-12-27 DIAGNOSIS — C50912 Malignant neoplasm of unspecified site of left female breast: Secondary | ICD-10-CM | POA: Diagnosis not present

## 2014-12-28 ENCOUNTER — Ambulatory Visit
Admission: RE | Admit: 2014-12-28 | Discharge: 2014-12-28 | Disposition: A | Discharge status: Home / Self Care | Payer: BLUE CROSS/BLUE SHIELD | Source: Ambulatory Visit | Attending: Radiation Oncology | Admitting: Radiation Oncology

## 2014-12-28 DIAGNOSIS — C50912 Malignant neoplasm of unspecified site of left female breast: Secondary | ICD-10-CM | POA: Diagnosis not present

## 2014-12-29 ENCOUNTER — Ambulatory Visit
Admission: RE | Admit: 2014-12-29 | Discharge: 2014-12-29 | Disposition: A | Discharge status: Home / Self Care | Payer: BLUE CROSS/BLUE SHIELD | Source: Ambulatory Visit | Attending: Radiation Oncology | Admitting: Radiation Oncology

## 2014-12-29 ENCOUNTER — Telehealth: Payer: Self-pay | Admitting: *Deleted

## 2014-12-29 ENCOUNTER — Other Ambulatory Visit: Payer: Self-pay | Admitting: *Deleted

## 2014-12-29 DIAGNOSIS — C50912 Malignant neoplasm of unspecified site of left female breast: Secondary | ICD-10-CM

## 2014-12-29 MED ORDER — GABAPENTIN 100 MG PO CAPS: 100.0000 mg | ORAL_CAPSULE | Freq: Three times a day (TID) | ORAL | Status: DC

## 2014-12-29 NOTE — Telephone Encounter (Signed)
Pt stated had numbness/tingling in both feet with itching in both palms of the hand. Per Dr. Rogue Bussing will call Rx for neurontin 100mg  TID. Rx escribed to [facility] in [place]. Instructed pt regarding new medication and how to take. Pt verbalized understanding.

## 2015-01-01 ENCOUNTER — Ambulatory Visit
Admission: RE | Admit: 2015-01-01 | Discharge: 2015-01-01 | Disposition: A | Discharge status: Home / Self Care | Payer: BLUE CROSS/BLUE SHIELD | Source: Ambulatory Visit | Attending: Radiation Oncology | Admitting: Radiation Oncology

## 2015-01-01 DIAGNOSIS — C50912 Malignant neoplasm of unspecified site of left female breast: Secondary | ICD-10-CM | POA: Diagnosis not present

## 2015-01-02 ENCOUNTER — Ambulatory Visit
Admission: RE | Admit: 2015-01-02 | Discharge: 2015-01-02 | Disposition: A | Discharge status: Home / Self Care | Payer: BLUE CROSS/BLUE SHIELD | Source: Ambulatory Visit | Attending: Radiation Oncology | Admitting: Radiation Oncology

## 2015-01-02 DIAGNOSIS — C50912 Malignant neoplasm of unspecified site of left female breast: Secondary | ICD-10-CM | POA: Diagnosis not present

## 2015-01-03 ENCOUNTER — Inpatient Hospital Stay: Payer: BLUE CROSS/BLUE SHIELD

## 2015-01-03 ENCOUNTER — Ambulatory Visit
Admission: RE | Admit: 2015-01-03 | Discharge: 2015-01-03 | Disposition: A | Discharge status: Home / Self Care | Payer: BLUE CROSS/BLUE SHIELD | Source: Ambulatory Visit | Attending: Radiation Oncology | Admitting: Radiation Oncology

## 2015-01-03 DIAGNOSIS — C50912 Malignant neoplasm of unspecified site of left female breast: Secondary | ICD-10-CM | POA: Diagnosis not present

## 2015-01-04 ENCOUNTER — Ambulatory Visit: Payer: BLUE CROSS/BLUE SHIELD

## 2015-01-05 ENCOUNTER — Ambulatory Visit
Admission: RE | Admit: 2015-01-05 | Discharge: 2015-01-05 | Disposition: A | Discharge status: Home / Self Care | Payer: BLUE CROSS/BLUE SHIELD | Source: Ambulatory Visit | Attending: Radiation Oncology | Admitting: Radiation Oncology

## 2015-01-05 DIAGNOSIS — C50912 Malignant neoplasm of unspecified site of left female breast: Secondary | ICD-10-CM | POA: Diagnosis not present

## 2015-01-07 ENCOUNTER — Ambulatory Visit
Admission: RE | Admit: 2015-01-07 | Discharge: 2015-01-07 | Disposition: A | Discharge status: Home / Self Care | Payer: BLUE CROSS/BLUE SHIELD | Source: Ambulatory Visit | Attending: Radiation Oncology | Admitting: Radiation Oncology

## 2015-01-08 ENCOUNTER — Ambulatory Visit
Admission: RE | Admit: 2015-01-08 | Discharge: 2015-01-08 | Disposition: A | Discharge status: Home / Self Care | Payer: BLUE CROSS/BLUE SHIELD | Source: Ambulatory Visit | Attending: Radiation Oncology | Admitting: Radiation Oncology

## 2015-01-08 DIAGNOSIS — C50912 Malignant neoplasm of unspecified site of left female breast: Secondary | ICD-10-CM | POA: Diagnosis not present

## 2015-01-09 ENCOUNTER — Ambulatory Visit
Admission: RE | Admit: 2015-01-09 | Discharge: 2015-01-09 | Disposition: A | Discharge status: Home / Self Care | Payer: BLUE CROSS/BLUE SHIELD | Source: Ambulatory Visit | Attending: Radiation Oncology | Admitting: Radiation Oncology

## 2015-01-09 DIAGNOSIS — C50912 Malignant neoplasm of unspecified site of left female breast: Secondary | ICD-10-CM | POA: Diagnosis not present

## 2015-01-10 ENCOUNTER — Ambulatory Visit: Payer: BLUE CROSS/BLUE SHIELD

## 2015-01-11 ENCOUNTER — Ambulatory Visit: Payer: BLUE CROSS/BLUE SHIELD

## 2015-01-11 ENCOUNTER — Other Ambulatory Visit: Payer: Self-pay | Admitting: *Deleted

## 2015-01-11 ENCOUNTER — Ambulatory Visit
Admission: RE | Admit: 2015-01-11 | Discharge: 2015-01-11 | Disposition: A | Discharge status: Home / Self Care | Payer: BLUE CROSS/BLUE SHIELD | Source: Ambulatory Visit | Attending: Radiation Oncology | Admitting: Radiation Oncology

## 2015-01-11 ENCOUNTER — Ambulatory Visit
Admission: RE | Admit: 2015-01-11 | Discharge: 2015-01-11 | Disposition: A | Discharge status: Home / Self Care | Payer: BLUE CROSS/BLUE SHIELD | Source: Ambulatory Visit | Attending: Oncology | Admitting: Oncology

## 2015-01-11 DIAGNOSIS — Z9221 Personal history of antineoplastic chemotherapy: Secondary | ICD-10-CM | POA: Insufficient documentation

## 2015-01-11 DIAGNOSIS — I5189 Other ill-defined heart diseases: Secondary | ICD-10-CM | POA: Diagnosis not present

## 2015-01-11 DIAGNOSIS — C50912 Malignant neoplasm of unspecified site of left female breast: Secondary | ICD-10-CM | POA: Diagnosis present

## 2015-01-11 MED ORDER — SILVER SULFADIAZINE 1 % EX CREA: 1.0000 "application " | TOPICAL_CREAM | Freq: Two times a day (BID) | CUTANEOUS | Status: DC

## 2015-01-11 MED ORDER — TECHNETIUM TC 99M-LABELED RED BLOOD CELLS IV KIT
22.5000 | PACK | Freq: Once | INTRAVENOUS | Status: AC | PRN
  Administered 2015-01-11: 23.302 via INTRAVENOUS

## 2015-01-12 ENCOUNTER — Ambulatory Visit
Admission: RE | Admit: 2015-01-12 | Discharge: 2015-01-12 | Disposition: A | Discharge status: Home / Self Care | Payer: BLUE CROSS/BLUE SHIELD | Source: Ambulatory Visit | Attending: Radiation Oncology | Admitting: Radiation Oncology

## 2015-01-12 DIAGNOSIS — C50912 Malignant neoplasm of unspecified site of left female breast: Secondary | ICD-10-CM | POA: Diagnosis not present

## 2015-01-15 ENCOUNTER — Inpatient Hospital Stay: Payer: BLUE CROSS/BLUE SHIELD | Attending: Oncology | Admitting: Oncology

## 2015-01-15 ENCOUNTER — Ambulatory Visit: Admission: RE | Admit: 2015-01-15 | Payer: BLUE CROSS/BLUE SHIELD | Source: Ambulatory Visit

## 2015-01-15 ENCOUNTER — Encounter: Payer: Self-pay | Admitting: Oncology

## 2015-01-15 ENCOUNTER — Inpatient Hospital Stay: Payer: BLUE CROSS/BLUE SHIELD

## 2015-01-15 ENCOUNTER — Ambulatory Visit
Admission: RE | Admit: 2015-01-15 | Discharge: 2015-01-15 | Disposition: A | Discharge status: Home / Self Care | Payer: BLUE CROSS/BLUE SHIELD | Source: Ambulatory Visit | Attending: Radiation Oncology | Admitting: Radiation Oncology

## 2015-01-15 VITALS — BP 128/69 | HR 69 | Temp 97.1°F | Wt 222.5 lb

## 2015-01-15 DIAGNOSIS — Z171 Estrogen receptor negative status [ER-]: Secondary | ICD-10-CM

## 2015-01-15 DIAGNOSIS — Z803 Family history of malignant neoplasm of breast: Secondary | ICD-10-CM | POA: Diagnosis not present

## 2015-01-15 DIAGNOSIS — Z87891 Personal history of nicotine dependence: Secondary | ICD-10-CM

## 2015-01-15 DIAGNOSIS — R5383 Other fatigue: Secondary | ICD-10-CM

## 2015-01-15 DIAGNOSIS — Z85828 Personal history of other malignant neoplasm of skin: Secondary | ICD-10-CM

## 2015-01-15 DIAGNOSIS — E785 Hyperlipidemia, unspecified: Secondary | ICD-10-CM

## 2015-01-15 DIAGNOSIS — Z01818 Encounter for other preprocedural examination: Secondary | ICD-10-CM | POA: Diagnosis not present

## 2015-01-15 DIAGNOSIS — Z5112 Encounter for antineoplastic immunotherapy: Secondary | ICD-10-CM | POA: Diagnosis not present

## 2015-01-15 DIAGNOSIS — C50912 Malignant neoplasm of unspecified site of left female breast: Secondary | ICD-10-CM

## 2015-01-15 DIAGNOSIS — K219 Gastro-esophageal reflux disease without esophagitis: Secondary | ICD-10-CM | POA: Diagnosis not present

## 2015-01-15 DIAGNOSIS — I10 Essential (primary) hypertension: Secondary | ICD-10-CM

## 2015-01-15 DIAGNOSIS — R531 Weakness: Secondary | ICD-10-CM

## 2015-01-15 DIAGNOSIS — Z79899 Other long term (current) drug therapy: Secondary | ICD-10-CM | POA: Diagnosis not present

## 2015-01-15 DIAGNOSIS — F329 Major depressive disorder, single episode, unspecified: Secondary | ICD-10-CM

## 2015-01-15 DIAGNOSIS — Z923 Personal history of irradiation: Secondary | ICD-10-CM

## 2015-01-15 LAB — CBC WITH DIFFERENTIAL/PLATELET
Basophils Absolute: 0.1 10*3/uL (ref 0–0.1)
Basophils Relative: 1 %
EOS ABS: 0.1 10*3/uL (ref 0–0.7)
Eosinophils Relative: 2 %
HEMATOCRIT: 39.4 % (ref 35.0–47.0)
HEMOGLOBIN: 13.2 g/dL (ref 12.0–16.0)
LYMPHS ABS: 2.2 10*3/uL (ref 1.0–3.6)
LYMPHS PCT: 32 %
MCH: 29.1 pg (ref 26.0–34.0)
MCHC: 33.5 g/dL (ref 32.0–36.0)
MCV: 86.7 fL (ref 80.0–100.0)
MONOS PCT: 8 %
Monocytes Absolute: 0.5 10*3/uL (ref 0.2–0.9)
NEUTROS ABS: 3.9 10*3/uL (ref 1.4–6.5)
NEUTROS PCT: 57 %
Platelets: 161 10*3/uL (ref 150–440)
RBC: 4.55 MIL/uL (ref 3.80–5.20)
RDW: 14 % (ref 11.5–14.5)
WBC: 6.8 10*3/uL (ref 3.6–11.0)

## 2015-01-15 LAB — COMPREHENSIVE METABOLIC PANEL
ALK PHOS: 46 U/L (ref 38–126)
ALT: 17 U/L (ref 14–54)
ANION GAP: 8 (ref 5–15)
AST: 26 U/L (ref 15–41)
Albumin: 3.9 g/dL (ref 3.5–5.0)
BILIRUBIN TOTAL: 0.8 mg/dL (ref 0.3–1.2)
BUN: 18 mg/dL (ref 6–20)
CO2: 27 mmol/L (ref 22–32)
CREATININE: 1.22 mg/dL — AB (ref 0.44–1.00)
Calcium: 9.3 mg/dL (ref 8.9–10.3)
Chloride: 105 mmol/L (ref 101–111)
GFR, EST AFRICAN AMERICAN: 53 mL/min — AB (ref >60)
GFR, EST NON AFRICAN AMERICAN: 46 mL/min — AB (ref >60)
Glucose, Bld: 126 mg/dL — ABNORMAL HIGH (ref 65–99)
Potassium: 3.2 mmol/L — ABNORMAL LOW (ref 3.5–5.1)
SODIUM: 140 mmol/L (ref 135–145)
Total Protein: 7 g/dL (ref 6.5–8.1)

## 2015-01-15 LAB — MAGNESIUM: Magnesium: 1.7 mg/dL (ref 1.7–2.4)

## 2015-01-15 MED ORDER — HEPARIN SOD (PORK) LOCK FLUSH 100 UNIT/ML IV SOLN
500.0000 [IU] | Freq: Once | INTRAVENOUS | Status: DC | PRN
  Filled 2015-01-15: qty 5

## 2015-01-15 MED ORDER — TRASTUZUMAB CHEMO INJECTION 440 MG
6.0000 mg/kg | Freq: Once | INTRAVENOUS | Status: AC
  Administered 2015-01-15: 588 mg via INTRAVENOUS
  Filled 2015-01-15: qty 28

## 2015-01-15 MED ORDER — ACETAMINOPHEN 325 MG PO TABS
650.0000 mg | ORAL_TABLET | Freq: Once | ORAL | Status: AC
  Administered 2015-01-15: 650 mg via ORAL
  Filled 2015-01-15: qty 2

## 2015-01-15 MED ORDER — SODIUM CHLORIDE 0.9 % IJ SOLN
10.0000 mL | INTRAMUSCULAR | Status: AC | PRN
  Administered 2015-01-15: 10 mL via INTRAVENOUS
  Filled 2015-01-15: qty 10

## 2015-01-15 MED ORDER — HEPARIN SOD (PORK) LOCK FLUSH 100 UNIT/ML IV SOLN
500.0000 [IU] | Freq: Once | INTRAVENOUS | Status: AC
  Administered 2015-01-15: 500 [IU] via INTRAVENOUS

## 2015-01-15 MED ORDER — SODIUM CHLORIDE 0.9 % IV SOLN
Freq: Once | INTRAVENOUS | Status: AC
  Administered 2015-01-15: 15:00:00 via INTRAVENOUS
  Filled 2015-01-15: qty 1000

## 2015-01-15 MED ORDER — DIPHENHYDRAMINE HCL 25 MG PO CAPS
50.0000 mg | ORAL_CAPSULE | Freq: Once | ORAL | Status: AC
  Administered 2015-01-15: 25 mg via ORAL
  Filled 2015-01-15: qty 2

## 2015-01-15 NOTE — Progress Notes (Signed)
[place] @ [facility] [phone]) [phone]  Fax:(336) [place] OB: 1950-03-24  MR#: [id number]  QIH#:[id number]  Patient Care Team: Jerrol Banana., MD as PCP - General (Family Medicine) Robert Bellow, MD as Consulting Physician (General Surgery) Nicholaus Bloom, MD as Consulting Physician (Plastic Surgery) Tammy Rondell Reams, [place] (Family Medicine)  CHIEF COMPLAINT:  Chief Complaint  Patient presents with  . OTHER   Oncology History   1.  Abnormal mammogram in April of 2016 followed by ultrasound and based on clinical examination 1 cm tumor.  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  (July 05, 2014) 2.  Status post lumpectomy and sentinel lymph node evaluation tumor size is 1.4 cm sentinel lymph nodes negative.  Stage IC T1 cN0 M0 tumor 3.  Patient is going for breast reduction surgery in June of 2016 4.  Adjuvant chemotherapy started with Taxol and Herceptin on September 04, 2014   5.  Patient received total 10 treatment of Taxol and has been discontinued course of progressing neuropathy Patient was referred to radiation therapy (November 15, 2014)   Oncology Flowsheet 10/16/2014 10/23/2014 10/30/2014 11/06/2014 11/15/2014 12/04/2014 12/25/2014  Day, Cycle Day 1, Cycle 3 Day 8, Cycle 3 Day 15, Cycle 3 Day 1, Cycle 4 - - -  dexamethasone (DECADRON) IV [ 10 mg ] [ 10 mg ] [ 10 mg ] [ 10 mg ] - - -  diphenhydrAMINE (BENADRYL) PO - - - - 50 mg 25 mg -  ondansetron (ZOFRAN) IV [ 8 mg ] [ 8 mg ] [ 8 mg ] [ 8 mg ] - - -  PACLitaxel (TAXOL) IV 80 mg/m2 80 mg/m2 72 mg/m2 60 mg/m2 - - -  trastuzumab (HERCEPTIN) IV - 6 mg/kg - - 6 mg/kg 6 mg/kg 6 mg/kg    INTERVAL HISTORY: 64 year old lady here for continuation of chemotherapy with Taxol and Herceptin 1.4 cm tumor which is HER-2 positive.  Estrogen and progesterone receptor negative. Patient is here for ongoing evaluation for  ongoing evaluation and treatment consideration. Patient started radiation therapy tolerating treatment very well. Feeling somewhat stronger after Taxol was discontinued because of progressing side effect with anemia, neuropathy .   REVIEW OF SYSTEMS:    general status: Patient is feeling weak and tired.  No change in a performance status.  No chills.  No fever. HEENT:   No evidence of stomatitis Lungs: No cough or shortness of breath Cardiac: No chest pain or paroxysmal nocturnal dyspnea GI:as per interval history.  New-onset of nausea vomiting diarrhea Skin: No rash Lower extremity no swelling Neurological system: No tingling.  No numbness.  No other focal signs Musculoskeletal system no bony pains . As per HPI. Otherwise, a complete review of systems is negatve.  PAST MEDICAL HISTORY: Past Medical History  Diagnosis Date  . Hypertension   . Hyperlipidemia   . GERD (gastroesophageal reflux disease)   . Depression 1995  . Cancer ([place])     skin   . 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 Complication of anesthesia 2004    oversensitive to noise, lights    PAST SURGICAL HISTORY: Past Surgical History  Procedure Laterality Date  . Back surgery    . Tonsillectomy    . Skin cancer  removal  2015    nose   .  Colonoscopy  2013    Dr. Vira Agar  . Breast lumpectomy with axillary lymph node dissection Left 08/03/2014    Procedure: BREAST LUMPECTOMY WITH AXILLARY LYMPH NODE DISSECTION;  Surgeon: Robert Bellow, MD;  Location: [facility];  Service: General;  Laterality: Left;  . Sentinel node biopsy Left 08/03/2014    Procedure: SENTINEL NODE BIOPSY;  Surgeon: Robert Bellow, MD;  Location: [facility];  Service: General;  Laterality: Left;  . Axillary lymph node biopsy Left 08/03/2014    Procedure: AXILLARY LYMPH NODE BIOPSY;  Surgeon: Robert Bellow, MD;  Location: [facility];  Service: General;  Laterality: Left;    . Breast surgery Left Aug 03, 2014    Wide excision, sentinel node biopsy.  . Portacath placement Right 08/31/2014    Procedure: INSERTION PORT-A-CATH;  Surgeon: Robert Bellow, MD;  Location: [facility] ORS;  Service: General;  Laterality: Right;    FAMILY HISTORY Family History  Problem Relation Age of Onset  . Breast cancer Mother 39       . Breast cancer Cousin 62         ADVANCED DIRECTIVES:  Patient does not have any living will or healthcare power of attorney.  Information was given .  Available resources had been discussed.  We will follow-up on subsequent appointments regarding this issue  HEALTH MAINTENANCE: Social History  Substance Use Topics  . Smoking status: Former Smoker -- 1.00 packs/day for 2 years    Types: Cigarettes    Quit date: 07/30/1976  . Smokeless tobacco: None  . Alcohol Use: 0.0 - 1.2 oz/week    0-1 Glasses of wine, 0-1 Shots of liquor, 0 Standard drinks or equivalent per week      Allergies  Allergen Reactions  . Sulfa Antibiotics Hives and Rash    Current Outpatient Prescriptions  Medication Sig Dispense Refill  . chlorpheniramine-HYDROcodone (TUSSIONEX) 10-8 MG/5ML SUER Take 5 mLs by mouth every 12 (twelve) hours as needed for cough. 140 mL 0  . gabapentin (NEURONTIN) 100 MG capsule Take 1 capsule (100 mg total) by mouth 3 (three) times daily. 90 capsule 3  . HYDROcodone-acetaminophen (NORCO) 5-325 MG per tablet Take 1-2 tablets by mouth every 4 (four) hours as needed for moderate pain or severe pain. 30 tablet 0  . KLOR-CON M20 20 MEQ tablet Take 2 tablets (40 mEq total) by mouth 2 (two) times daily. 90 tablet 3  . lidocaine-prilocaine (EMLA) cream Apply 1 application topically as needed. 30 g 3  . omeprazole (PRILOSEC) 40 MG capsule Take 40 mg by mouth every morning.   4  . ondansetron (ZOFRAN) 8 MG tablet Take 1 tablet (8 mg total) by mouth 2 (two) times daily. Start the day after chemo for 2 days. Then take as needed for nausea or vomiting.  30 tablet 1  . pravastatin (PRAVACHOL) 40 MG tablet Take 40 mg by mouth every morning.   3  . sertraline (ZOLOFT) 100 MG tablet Take 100 mg by mouth every morning.   4  . silver sulfADIAZINE (SILVADENE) 1 % cream Apply 1 application topically 2 (two) times daily. 50 g 2  . triamterene-hydrochlorothiazide (MAXZIDE) 75-50 MG per tablet Take 1 tablet by mouth every morning.   4   No current facility-administered medications for this visit.   Facility-Administered Medications Ordered in Other Visits  Medication Dose Route Frequency Provider Last Rate Last Dose  . acetaminophen (TYLENOL) tablet 650 mg  650 mg Oral Once Forest Gleason, MD      .  diphenhydrAMINE (BENADRYL) capsule 50 mg  50 mg Oral Once Forest Gleason, MD      . heparin lock flush 100 unit/mL  500 Units Intravenous Once Forest Gleason, MD      . sodium chloride 0.9 % injection 10 mL  10 mL Intravenous PRN Forest Gleason, MD   10 mL at 09/25/14 1418  . sodium chloride 0.9 % injection 10 mL  10 mL Intravenous PRN Forest Gleason, MD   10 mL at 01/15/15 1325    OBJECTIVE:  Filed Vitals:   01/15/15 1352  BP: 128/69  Pulse: 69  Temp: 97.1 F (36.2 C)     Body mass index is 37.03 kg/(m^2).    ECOG FS:0 - Asymptomatic  PHYSICAL EXAM: General  status: Performance status is good.  Patient has not lost significant weight HEENT: No evidence of stomatitis. Sclera and conjunctivae :: No jaundice.   pale looking. Lungs: Air  entry equal on both sides.  No rhonchi.  No rales.  Cardiac: Heart sounds are normal.  No pericardial rub.  No murmur. Lymphatic system: Cervical, axillary, inguinal, lymph nodes not palpable GI: Abdomen is soft.  No ascites.  Liver spleen not palpable.  No tenderness.  Bowel sounds are within normal limit Lower extremity: No edema Neurological system: Higher functions, cranial nerves intact . 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  No ecchymosis.. Bilateral breast mammoplasty    Tolerating radiation therapy without any redness or skin changes Port site within normal limit healing well. Alopecia. No stomatitis   LAB RESULTS:  Infusion on 01/15/2015  Component Date Value Ref Range Status  . WBC 01/15/2015 6.8  3.6 - 11.0 K/uL Final   A-LINE DRAW  . RBC 01/15/2015 4.55  3.80 - 5.20 MIL/uL Final  . Hemoglobin 01/15/2015 13.2  12.0 - 16.0 g/dL Final  . HCT 01/15/2015 39.4  35.0 - 47.0 % Final  . MCV 01/15/2015 86.7  80.0 - 100.0 fL Final  . MCH 01/15/2015 29.1  26.0 - 34.0 pg Final  . MCHC 01/15/2015 33.5  32.0 - 36.0 g/dL Final  . RDW 01/15/2015 14.0  11.5 - 14.5 % Final  . Platelets 01/15/2015 161  150 - 440 K/uL Final  . Neutrophils Relative % 01/15/2015 57   Final  . Neutro Abs 01/15/2015 3.9  1.4 - 6.5 K/uL Final  . Lymphocytes Relative 01/15/2015 32   Final  . Lymphs Abs 01/15/2015 2.2  1.0 - 3.6 K/uL Final  . Monocytes Relative 01/15/2015 8   Final  . Monocytes Absolute 01/15/2015 0.5  0.2 - 0.9 K/uL Final  . Eosinophils Relative 01/15/2015 2   Final  . Eosinophils Absolute 01/15/2015 0.1  0 - 0.7 K/uL Final  . Basophils Relative 01/15/2015 1   Final  . Basophils Absolute 01/15/2015 0.1  0 - 0.1 K/uL Final       ASSESSMENT: Carcinoma of breast status post lumpectomy and sentinel lymph node (left breast) stage IC Estrogen and progesterone receptor negative HER-2/neu positive tumor Status post mammoplasty on both breast All lab data has been reviewed. Continue Herceptin therapy. Repeat MUGA scan of the heartburn third November, 2016 has been reviewed.  Ejection fraction is 57% (previously was 62%) Continue Herceptin  MEDICAL DECISION MAKING:   Continue Herceptin All lab data has been reviewed  MUGA scan of the heart is been reviewed independently  Patient expressed understanding and was in agreement with this plan. She also understands that She can call clinic at  any time with any questions, concerns, or complaints.    Carcinoma of  left breast   Staging form: Breast, AJCC 7th Edition     Clinical: Stage IA (T1c, N0, M0) - Unsigned   Forest Gleason, MD   01/15/2015 2:25 PM

## 2015-01-15 NOTE — Progress Notes (Signed)
Patient complains of fatigue.

## 2015-01-16 ENCOUNTER — Ambulatory Visit
Admission: RE | Admit: 2015-01-16 | Discharge: 2015-01-16 | Disposition: A | Discharge status: Home / Self Care | Payer: BLUE CROSS/BLUE SHIELD | Source: Ambulatory Visit | Attending: Radiation Oncology | Admitting: Radiation Oncology

## 2015-01-16 ENCOUNTER — Ambulatory Visit: Payer: BLUE CROSS/BLUE SHIELD

## 2015-01-17 ENCOUNTER — Ambulatory Visit
Admission: RE | Admit: 2015-01-17 | Discharge: 2015-01-17 | Disposition: A | Discharge status: Home / Self Care | Payer: BLUE CROSS/BLUE SHIELD | Source: Ambulatory Visit | Attending: Radiation Oncology | Admitting: Radiation Oncology

## 2015-01-17 ENCOUNTER — Inpatient Hospital Stay: Payer: BLUE CROSS/BLUE SHIELD

## 2015-01-17 DIAGNOSIS — C50912 Malignant neoplasm of unspecified site of left female breast: Secondary | ICD-10-CM

## 2015-01-17 LAB — CBC WITH DIFFERENTIAL/PLATELET
BASOS ABS: 0.1 10*3/uL (ref 0–0.1)
Basophils Relative: 1 %
Eosinophils Absolute: 0.1 10*3/uL (ref 0–0.7)
Eosinophils Relative: 2 %
HEMATOCRIT: 41.8 % (ref 35.0–47.0)
HEMOGLOBIN: 14 g/dL (ref 12.0–16.0)
LYMPHS PCT: 29 %
Lymphs Abs: 2.1 10*3/uL (ref 1.0–3.6)
MCH: 29.1 pg (ref 26.0–34.0)
MCHC: 33.6 g/dL (ref 32.0–36.0)
MCV: 86.5 fL (ref 80.0–100.0)
MONO ABS: 0.6 10*3/uL (ref 0.2–0.9)
Monocytes Relative: 8 %
NEUTROS ABS: 4.3 10*3/uL (ref 1.4–6.5)
NEUTROS PCT: 60 %
Platelets: 181 10*3/uL (ref 150–440)
RBC: 4.83 MIL/uL (ref 3.80–5.20)
RDW: 14 % (ref 11.5–14.5)
WBC: 7.3 10*3/uL (ref 3.6–11.0)

## 2015-01-18 ENCOUNTER — Ambulatory Visit
Admission: RE | Admit: 2015-01-18 | Discharge: 2015-01-18 | Disposition: A | Discharge status: Home / Self Care | Payer: BLUE CROSS/BLUE SHIELD | Source: Ambulatory Visit | Attending: Radiation Oncology | Admitting: Radiation Oncology

## 2015-01-18 DIAGNOSIS — C50912 Malignant neoplasm of unspecified site of left female breast: Secondary | ICD-10-CM | POA: Diagnosis not present

## 2015-01-19 ENCOUNTER — Ambulatory Visit
Admission: RE | Admit: 2015-01-19 | Discharge: 2015-01-19 | Disposition: A | Discharge status: Home / Self Care | Payer: BLUE CROSS/BLUE SHIELD | Source: Ambulatory Visit | Attending: Radiation Oncology | Admitting: Radiation Oncology

## 2015-01-19 DIAGNOSIS — C50912 Malignant neoplasm of unspecified site of left female breast: Secondary | ICD-10-CM | POA: Diagnosis not present

## 2015-01-22 ENCOUNTER — Ambulatory Visit
Admission: RE | Admit: 2015-01-22 | Discharge: 2015-01-22 | Disposition: A | Discharge status: Home / Self Care | Payer: BLUE CROSS/BLUE SHIELD | Source: Ambulatory Visit | Attending: Radiation Oncology | Admitting: Radiation Oncology

## 2015-01-22 ENCOUNTER — Ambulatory Visit: Payer: BLUE CROSS/BLUE SHIELD

## 2015-01-22 DIAGNOSIS — C50912 Malignant neoplasm of unspecified site of left female breast: Secondary | ICD-10-CM | POA: Diagnosis not present

## 2015-01-23 ENCOUNTER — Ambulatory Visit: Payer: BLUE CROSS/BLUE SHIELD

## 2015-01-23 ENCOUNTER — Ambulatory Visit
Admission: RE | Admit: 2015-01-23 | Discharge: 2015-01-23 | Disposition: A | Discharge status: Home / Self Care | Payer: BLUE CROSS/BLUE SHIELD | Source: Ambulatory Visit | Attending: Radiation Oncology | Admitting: Radiation Oncology

## 2015-01-23 DIAGNOSIS — C50912 Malignant neoplasm of unspecified site of left female breast: Secondary | ICD-10-CM | POA: Diagnosis not present

## 2015-01-24 ENCOUNTER — Ambulatory Visit: Payer: BLUE CROSS/BLUE SHIELD

## 2015-01-24 DIAGNOSIS — C50912 Malignant neoplasm of unspecified site of left female breast: Secondary | ICD-10-CM | POA: Diagnosis not present

## 2015-01-25 ENCOUNTER — Ambulatory Visit: Payer: BLUE CROSS/BLUE SHIELD

## 2015-01-25 DIAGNOSIS — C50912 Malignant neoplasm of unspecified site of left female breast: Secondary | ICD-10-CM | POA: Diagnosis not present

## 2015-01-26 ENCOUNTER — Ambulatory Visit: Payer: BLUE CROSS/BLUE SHIELD

## 2015-01-26 ENCOUNTER — Ambulatory Visit
Admission: RE | Admit: 2015-01-26 | Discharge: 2015-01-26 | Disposition: A | Discharge status: Home / Self Care | Payer: BLUE CROSS/BLUE SHIELD | Source: Ambulatory Visit | Attending: Radiation Oncology | Admitting: Radiation Oncology

## 2015-01-29 ENCOUNTER — Ambulatory Visit
Admission: RE | Admit: 2015-01-29 | Discharge: 2015-01-29 | Disposition: A | Discharge status: Home / Self Care | Payer: BLUE CROSS/BLUE SHIELD | Source: Ambulatory Visit | Attending: Radiation Oncology | Admitting: Radiation Oncology

## 2015-01-29 ENCOUNTER — Ambulatory Visit: Payer: BLUE CROSS/BLUE SHIELD

## 2015-01-29 DIAGNOSIS — C50912 Malignant neoplasm of unspecified site of left female breast: Secondary | ICD-10-CM | POA: Diagnosis not present

## 2015-01-30 ENCOUNTER — Ambulatory Visit
Admission: RE | Admit: 2015-01-30 | Discharge: 2015-01-30 | Disposition: A | Discharge status: Home / Self Care | Payer: BLUE CROSS/BLUE SHIELD | Source: Ambulatory Visit | Attending: Radiation Oncology | Admitting: Radiation Oncology

## 2015-01-30 ENCOUNTER — Ambulatory Visit: Payer: BLUE CROSS/BLUE SHIELD

## 2015-01-30 DIAGNOSIS — C50912 Malignant neoplasm of unspecified site of left female breast: Secondary | ICD-10-CM | POA: Diagnosis not present

## 2015-01-31 ENCOUNTER — Ambulatory Visit
Admission: RE | Admit: 2015-01-31 | Discharge: 2015-01-31 | Disposition: A | Discharge status: Home / Self Care | Payer: BLUE CROSS/BLUE SHIELD | Source: Ambulatory Visit | Attending: Radiation Oncology | Admitting: Radiation Oncology

## 2015-01-31 ENCOUNTER — Ambulatory Visit: Payer: BLUE CROSS/BLUE SHIELD

## 2015-01-31 DIAGNOSIS — C50912 Malignant neoplasm of unspecified site of left female breast: Secondary | ICD-10-CM | POA: Diagnosis not present

## 2015-02-01 ENCOUNTER — Ambulatory Visit: Payer: BLUE CROSS/BLUE SHIELD

## 2015-02-02 ENCOUNTER — Ambulatory Visit: Payer: BLUE CROSS/BLUE SHIELD

## 2015-02-05 ENCOUNTER — Encounter: Payer: Self-pay | Admitting: Oncology

## 2015-02-05 ENCOUNTER — Inpatient Hospital Stay (HOSPITAL_BASED_OUTPATIENT_CLINIC_OR_DEPARTMENT_OTHER): Payer: BLUE CROSS/BLUE SHIELD | Admitting: Oncology

## 2015-02-05 ENCOUNTER — Ambulatory Visit: Admission: RE | Admit: 2015-02-05 | Payer: BLUE CROSS/BLUE SHIELD | Source: Ambulatory Visit

## 2015-02-05 ENCOUNTER — Inpatient Hospital Stay: Payer: BLUE CROSS/BLUE SHIELD

## 2015-02-05 ENCOUNTER — Ambulatory Visit
Admission: RE | Admit: 2015-02-05 | Discharge: 2015-02-05 | Disposition: A | Discharge status: Home / Self Care | Payer: BLUE CROSS/BLUE SHIELD | Source: Ambulatory Visit | Attending: Radiation Oncology | Admitting: Radiation Oncology

## 2015-02-05 VITALS — BP 155/81 | HR 74 | Temp 97.7°F | Wt 222.7 lb

## 2015-02-05 DIAGNOSIS — Z803 Family history of malignant neoplasm of breast: Secondary | ICD-10-CM

## 2015-02-05 DIAGNOSIS — Z87891 Personal history of nicotine dependence: Secondary | ICD-10-CM

## 2015-02-05 DIAGNOSIS — R531 Weakness: Secondary | ICD-10-CM

## 2015-02-05 DIAGNOSIS — Z923 Personal history of irradiation: Secondary | ICD-10-CM

## 2015-02-05 DIAGNOSIS — R5383 Other fatigue: Secondary | ICD-10-CM

## 2015-02-05 DIAGNOSIS — K219 Gastro-esophageal reflux disease without esophagitis: Secondary | ICD-10-CM

## 2015-02-05 DIAGNOSIS — Z85828 Personal history of other malignant neoplasm of skin: Secondary | ICD-10-CM

## 2015-02-05 DIAGNOSIS — Z79899 Other long term (current) drug therapy: Secondary | ICD-10-CM | POA: Diagnosis not present

## 2015-02-05 DIAGNOSIS — I10 Essential (primary) hypertension: Secondary | ICD-10-CM

## 2015-02-05 DIAGNOSIS — E785 Hyperlipidemia, unspecified: Secondary | ICD-10-CM

## 2015-02-05 DIAGNOSIS — Z171 Estrogen receptor negative status [ER-]: Secondary | ICD-10-CM

## 2015-02-05 DIAGNOSIS — C50912 Malignant neoplasm of unspecified site of left female breast: Secondary | ICD-10-CM | POA: Diagnosis not present

## 2015-02-05 DIAGNOSIS — F329 Major depressive disorder, single episode, unspecified: Secondary | ICD-10-CM

## 2015-02-05 DIAGNOSIS — Z01818 Encounter for other preprocedural examination: Secondary | ICD-10-CM

## 2015-02-05 LAB — COMPREHENSIVE METABOLIC PANEL
ALBUMIN: 3.9 g/dL (ref 3.5–5.0)
ALT: 18 U/L (ref 14–54)
AST: 23 U/L (ref 15–41)
Alkaline Phosphatase: 58 U/L (ref 38–126)
Anion gap: 9 (ref 5–15)
BUN: 21 mg/dL — ABNORMAL HIGH (ref 6–20)
CALCIUM: 9 mg/dL (ref 8.9–10.3)
CHLORIDE: 101 mmol/L (ref 101–111)
CO2: 26 mmol/L (ref 22–32)
Creatinine, Ser: 1.17 mg/dL — ABNORMAL HIGH (ref 0.44–1.00)
GFR calc Af Amer: 56 mL/min — ABNORMAL LOW (ref >60)
GFR, EST NON AFRICAN AMERICAN: 48 mL/min — AB (ref >60)
Glucose, Bld: 93 mg/dL (ref 65–99)
Potassium: 3.5 mmol/L (ref 3.5–5.1)
SODIUM: 136 mmol/L (ref 135–145)
Total Bilirubin: 0.9 mg/dL (ref 0.3–1.2)
Total Protein: 7.5 g/dL (ref 6.5–8.1)

## 2015-02-05 LAB — CBC WITH DIFFERENTIAL/PLATELET
BASOS ABS: 0.1 10*3/uL (ref 0–0.1)
BASOS PCT: 2 %
EOS ABS: 0.1 10*3/uL (ref 0–0.7)
EOS PCT: 2 %
HCT: 42.4 % (ref 35.0–47.0)
Hemoglobin: 14.2 g/dL (ref 12.0–16.0)
Lymphocytes Relative: 29 %
Lymphs Abs: 2.3 10*3/uL (ref 1.0–3.6)
MCH: 28.2 pg (ref 26.0–34.0)
MCHC: 33.4 g/dL (ref 32.0–36.0)
MCV: 84.6 fL (ref 80.0–100.0)
MONO ABS: 0.7 10*3/uL (ref 0.2–0.9)
Monocytes Relative: 9 %
Neutro Abs: 4.7 10*3/uL (ref 1.4–6.5)
Neutrophils Relative %: 58 %
PLATELETS: 185 10*3/uL (ref 150–440)
RBC: 5.01 MIL/uL (ref 3.80–5.20)
RDW: 13.4 % (ref 11.5–14.5)
WBC: 8 10*3/uL (ref 3.6–11.0)

## 2015-02-05 MED ORDER — DIPHENHYDRAMINE HCL 25 MG PO CAPS
50.0000 mg | ORAL_CAPSULE | Freq: Once | ORAL | Status: DC
  Filled 2015-02-05: qty 2

## 2015-02-05 MED ORDER — SODIUM CHLORIDE 0.9 % IJ SOLN
10.0000 mL | Freq: Once | INTRAMUSCULAR | Status: AC
  Administered 2015-02-05: 10 mL via INTRAVENOUS
  Filled 2015-02-05: qty 10

## 2015-02-05 MED ORDER — SODIUM CHLORIDE 0.9 % IV SOLN
INTRAVENOUS | Status: DC
  Administered 2015-02-05: 15:00:00 via INTRAVENOUS
  Filled 2015-02-05: qty 1000

## 2015-02-05 MED ORDER — ACETAMINOPHEN 325 MG PO TABS
650.0000 mg | ORAL_TABLET | Freq: Once | ORAL | Status: AC
Start: 2015-02-05 — End: 2015-02-05
  Administered 2015-02-05: 650 mg via ORAL
  Filled 2015-02-05: qty 2

## 2015-02-05 MED ORDER — TRASTUZUMAB CHEMO INJECTION 440 MG
6.0000 mg/kg | Freq: Once | INTRAVENOUS | Status: AC
  Administered 2015-02-05: 588 mg via INTRAVENOUS
  Filled 2015-02-05: qty 28

## 2015-02-05 MED ORDER — HEPARIN SOD (PORK) LOCK FLUSH 100 UNIT/ML IV SOLN
500.0000 [IU] | Freq: Once | INTRAVENOUS | Status: AC | PRN
  Administered 2015-02-05: 500 [IU]
  Filled 2015-02-05: qty 5

## 2015-02-05 MED ORDER — LIDOCAINE-PRILOCAINE 2.5-2.5 % EX CREA: 1.0000 "application " | TOPICAL_CREAM | CUTANEOUS | Status: DC | PRN

## 2015-02-05 NOTE — Progress Notes (Signed)
Patient requesting refill for EMLA cream.  C/o fatigue.

## 2015-02-05 NOTE — Progress Notes (Signed)
[place] @ [facility] Telephone:(336) [phone]  Fax:(336) [place] OB: [birth year]  MR#: [id number]  WVP#:[id number]  Patient Care Team: Jerrol Banana., MD as PCP - General (Family Medicine) Robert Bellow, MD as Consulting Physician (General Surgery) Nicholaus Bloom, MD as Consulting Physician (Plastic Surgery) Tammy Rondell Reams, [place] (Family Medicine)  CHIEF COMPLAINT:  Chief Complaint  Patient presents with  . Breast Cancer   Oncology History   1.  Abnormal mammogram in April of 2016 followed by ultrasound and based on clinical examination 1 cm tumor.  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  (July 05, 2014) 2.  Status post lumpectomy and sentinel lymph node evaluation tumor size is 1.4 cm sentinel lymph nodes negative.  Stage IC T1 cN0 M0 tumor 3.  Patient is going for breast reduction surgery in June of 2016 4.  Adjuvant chemotherapy started with Taxol and Herceptin on September 04, 2014   5.  Patient received total 10 treatment of Taxol and has been discontinued course of progressing neuropathy Patient was referred to radiation therapy (November 15, 2014)   Oncology Flowsheet 10/23/2014 10/30/2014 11/06/2014 11/15/2014 12/04/2014 12/25/2014 01/15/2015  Day, Cycle Day 8, Cycle 3 Day 15, Cycle 3 Day 1, Cycle 4 - - - -  dexamethasone (DECADRON) IV [ 10 mg ] [ 10 mg ] [ 10 mg ] - - - -  diphenhydrAMINE (BENADRYL) PO - - - 50 mg 25 mg - 25 mg  ondansetron (ZOFRAN) IV [ 8 mg ] [ 8 mg ] [ 8 mg ] - - - -  PACLitaxel (TAXOL) IV 80 mg/m2 72 mg/m2 60 mg/m2 - - - -  trastuzumab (HERCEPTIN) IV 6 mg/kg - - 6 mg/kg 6 mg/kg 6 mg/kg 6 mg/kg    INTERVAL HISTORY: 64 year old lady here for continuation of chemotherapy with Taxol and Herceptin 1.4 cm tumor which is HER-2 positive.  Estrogen and progesterone receptor negative. Patient is here for ongoing evaluation for ongoing  evaluation and treatment consideration. Patient started radiation therapy tolerating treatment very well. Feeling somewhat stronger after Taxol was discontinued because of progressing side effect with anemia, neuropathy .  Patient is here for ongoing evaluation and continuation of chemotherapy with Herceptin Patient does not have any significant nausea vomiting diarrhea appetite has been fairly good.   REVIEW OF SYSTEMS:    general status: Patient is feeling weak and tired.  No change in a performance status.  No chills.  No fever. HEENT:   No evidence of stomatitis Lungs: No cough or shortness of breath Cardiac: No chest pain or paroxysmal nocturnal dyspnea GI:as per interval history.  New-onset of nausea vomiting diarrhea Skin: No rash Lower extremity no swelling Neurological system: No tingling.  No numbness.  No other focal signs Musculoskeletal system no bony pains . As per HPI. Otherwise, a complete review of systems is negatve.  PAST MEDICAL HISTORY: Past Medical History  Diagnosis Date  . Hypertension   . Hyperlipidemia   . GERD (gastroesophageal reflux disease)   . Depression 1995  . Cancer ([place])     skin   . Carcinoma of left breast ([place]) 07/30/2014    1.4 cm, T1c, N0, ER negative, PR negative, HER-2 positive  . Complication of anesthesia 2004    oversensitive to noise, lights    PAST SURGICAL HISTORY: Past Surgical History  Procedure Laterality Date  . Back surgery    .  Tonsillectomy    . Skin cancer  removal  2015    nose   . Colonoscopy  2013    Dr. Vira Agar  . Breast lumpectomy with axillary lymph node dissection Left 08/03/2014    Procedure: BREAST LUMPECTOMY WITH AXILLARY LYMPH NODE DISSECTION;  Surgeon: Robert Bellow, MD;  Location: [facility] OR[facility];  Service: General;  Laterality: Left;  . Sentinel node biopsy Left 08/03/2014    Procedure: SENTINEL NODE BIOPSY;  Surgeon: Robert Bellow, MD;  Location: [facility];  Service: General;  Laterality: Left;  .  Axillary lymph node biopsy Left 08/03/2014    Procedure: AXILLARY LYMPH NODE BIOPSY;  Surgeon: Robert Bellow, MD;  Location: [facility];  Service: General;  Laterality: Left;  . Breast surgery Left Aug 03, 2014    Wide excision, sentinel node biopsy.  . Portacath placement Right 08/31/2014    Procedure: INSERTION PORT-A-CATH;  Surgeon: Robert Bellow, MD;  Location: [facility] ORS;  Service: General;  Laterality: Right;    FAMILY HISTORY Family History  Problem Relation Age of Onset  . Breast cancer Mother 51       . Breast cancer Cousin 62         ADVANCED DIRECTIVES:  Patient does not have any living will or healthcare power of attorney.  Information was given .  Available resources had been discussed.  We will follow-up on subsequent appointments regarding this issue  HEALTH MAINTENANCE: Social History  Substance Use Topics  . Smoking status: Former Smoker -- 1.00 packs/day for 2 years    Types: Cigarettes    Quit date: 07/30/1976  . Smokeless tobacco: None  . Alcohol Use: 0.0 - 1.2 oz/week    0-1 Glasses of wine, 0-1 Shots of liquor, 0 Standard drinks or equivalent per week      Allergies  Allergen Reactions  . Sulfa Antibiotics Hives and Rash    Current Outpatient Prescriptions  Medication Sig Dispense Refill  . chlorpheniramine-HYDROcodone (TUSSIONEX) 10-8 MG/5ML SUER Take 5 mLs by mouth every 12 (twelve) hours as needed for cough. 140 mL 0  . gabapentin (NEURONTIN) 100 MG capsule Take 1 capsule (100 mg total) by mouth 3 (three) times daily. 90 capsule 3  . HYDROcodone-acetaminophen (NORCO) 5-325 MG per tablet Take 1-2 tablets by mouth every 4 (four) hours as needed for moderate pain or severe pain. 30 tablet 0  . KLOR-CON M20 20 MEQ tablet Take 2 tablets (40 mEq total) by mouth 2 (two) times daily. 90 tablet 3  . lidocaine-prilocaine (EMLA) cream Apply 1 application topically as needed. 30 g 3  . omeprazole (PRILOSEC) 40 MG capsule Take 40 mg by mouth every morning.    4  . ondansetron (ZOFRAN) 8 MG tablet Take 1 tablet (8 mg total) by mouth 2 (two) times daily. Start the day after chemo for 2 days. Then take as needed for nausea or vomiting. 30 tablet 1  . pravastatin (PRAVACHOL) 40 MG tablet Take 40 mg by mouth every morning.   3  . sertraline (ZOLOFT) 100 MG tablet Take 100 mg by mouth every morning.   4  . silver sulfADIAZINE (SILVADENE) 1 % cream Apply 1 application topically 2 (two) times daily. 50 g 2  . triamterene-hydrochlorothiazide (MAXZIDE) 75-50 MG per tablet Take 1 tablet by mouth every morning.   4   No current facility-administered medications for this visit.   Facility-Administered Medications Ordered in Other Visits  Medication Dose Route Frequency Provider Last Rate Last Dose  .  acetaminophen (TYLENOL) tablet 650 mg  650 mg Oral Once Forest Gleason, MD      . diphenhydrAMINE (BENADRYL) capsule 50 mg  50 mg Oral Once Forest Gleason, MD      . heparin lock flush 100 unit/mL  500 Units Intracatheter Once PRN Forest Gleason, MD      . sodium chloride 0.9 % injection 10 mL  10 mL Intravenous PRN Forest Gleason, MD   10 mL at 09/25/14 1418  . sodium chloride 0.9 % injection 10 mL  10 mL Intravenous PRN Forest Gleason, MD   10 mL at 01/15/15 1325    OBJECTIVE:  Filed Vitals:   02/05/15 1338  BP: 155/81  Pulse: 74  Temp: 97.7 F (36.5 C)     Body mass index is 37.05 kg/(m^2).    ECOG FS:0 - Asymptomatic  PHYSICAL EXAM: General  status: Performance status is good.  Patient has not lost significant weight HEENT: No evidence of stomatitis. Sclera and conjunctivae :: No jaundice.   pale looking. Lungs: Air  entry equal on both sides.  No rhonchi.  No rales.  Cardiac: Heart sounds are normal.  No pericardial rub.  No murmur. Lymphatic system: Cervical, axillary, inguinal, lymph nodes not palpable GI: Abdomen is soft.  No ascites.  Liver spleen not palpable.  No tenderness.  Bowel sounds are within normal limit Lower extremity: No edema Neurological  system: Higher functions, cranial nerves intact . 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  No ecchymosis.. Bilateral breast mammoplasty  Tolerating radiation therapy without any redness or skin changes Port site within normal limit healing well. Alopecia. No stomatitis   LAB RESULTS:  Infusion on 02/05/2015  Component Date Value Ref Range Status  . WBC 02/05/2015 8.0  3.6 - 11.0 K/uL Final   A-LINE DRAW  . RBC 02/05/2015 5.01  3.80 - 5.20 MIL/uL Final  . Hemoglobin 02/05/2015 14.2  12.0 - 16.0 g/dL Final  . HCT 02/05/2015 42.4  35.0 - 47.0 % Final  . MCV 02/05/2015 84.6  80.0 - 100.0 fL Final  . MCH 02/05/2015 28.2  26.0 - 34.0 pg Final  . MCHC 02/05/2015 33.4  32.0 - 36.0 g/dL Final  . RDW 02/05/2015 13.4  11.5 - 14.5 % Final  . Platelets 02/05/2015 185  150 - 440 K/uL Final  . Neutrophils Relative % 02/05/2015 58   Final  . Neutro Abs 02/05/2015 4.7  1.4 - 6.5 K/uL Final  . Lymphocytes Relative 02/05/2015 29   Final  . Lymphs Abs 02/05/2015 2.3  1.0 - 3.6 K/uL Final  . Monocytes Relative 02/05/2015 9   Final  . Monocytes Absolute 02/05/2015 0.7  0.2 - 0.9 K/uL Final  . Eosinophils Relative 02/05/2015 2   Final  . Eosinophils Absolute 02/05/2015 0.1  0 - 0.7 K/uL Final  . Basophils Relative 02/05/2015 2   Final  . Basophils Absolute 02/05/2015 0.1  0 - 0.1 K/uL Final  . Sodium 02/05/2015 136  135 - 145 mmol/L Final  . Potassium 02/05/2015 3.5  3.5 - 5.1 mmol/L Final  . Chloride 02/05/2015 101  101 - 111 mmol/L Final  . CO2 02/05/2015 26  22 - 32 mmol/L Final  . Glucose, Bld 02/05/2015 93  65 - 99 mg/dL Final  . BUN 02/05/2015 21* 6 - 20 mg/dL Final  . Creatinine, Ser 02/05/2015 1.17* 0.44 - 1.00 mg/dL Final  . Calcium 02/05/2015 9.0  8.9 - 10.3 mg/dL Final  . Total  Protein 02/05/2015 7.5  6.5 - 8.1 g/dL Final  . Albumin 02/05/2015 3.9  3.5 - 5.0 g/dL Final  . AST 02/05/2015 23  15 - 41 U/L Final  . ALT 02/05/2015 18  14 - 54 U/L  Final  . Alkaline Phosphatase 02/05/2015 58  38 - 126 U/L Final  . Total Bilirubin 02/05/2015 0.9  0.3 - 1.2 mg/dL Final  . GFR calc non Af Amer 02/05/2015 48* >60 mL/min Final  . GFR calc Af Amer 02/05/2015 56* >60 mL/min Final   Comment: (NOTE) The eGFR has been calculated using the CKD EPI equation. This calculation has not been validated in all clinical situations. eGFR's persistently <60 mL/min signify possible Chronic Kidney Disease.   . Anion gap 02/05/2015 9  5 - 15 Final       ASSESSMENT: Carcinoma of breast status post lumpectomy and sentinel lymph node (left breast) stage IC Estrogen and progesterone receptor negative HER-2/neu positive tumor Status post mammoplasty on both breast All lab data has been reviewed. Continue Herceptin therapy. Repeat MUGA scan of the heartburn third November, 2016 has been reviewed.  Ejection fraction is 57% (previously was 62%) Continue Herceptin  MEDICAL DECISION MAKING:   Continue Herceptin All lab data has been reviewed  MUGA scan of the heart is been reviewed independently  Patient expressed understanding and was in agreement with this plan. She also understands that She can call clinic at any time with any questions, concerns, or complaints.    Carcinoma of left breast   Staging form: Breast, AJCC 7th Edition     Clinical: Stage IA (T1c, N0, M0) - Marni Griffon, MD   02/05/2015 2:04 PM

## 2015-02-06 ENCOUNTER — Ambulatory Visit: Payer: BLUE CROSS/BLUE SHIELD

## 2015-02-07 ENCOUNTER — Ambulatory Visit: Payer: BLUE CROSS/BLUE SHIELD

## 2015-02-07 ENCOUNTER — Ambulatory Visit
Admission: RE | Admit: 2015-02-07 | Discharge: 2015-02-07 | Disposition: A | Discharge status: Home / Self Care | Payer: BLUE CROSS/BLUE SHIELD | Source: Ambulatory Visit | Attending: Radiation Oncology | Admitting: Radiation Oncology

## 2015-02-07 DIAGNOSIS — C50912 Malignant neoplasm of unspecified site of left female breast: Secondary | ICD-10-CM | POA: Diagnosis not present

## 2015-02-08 ENCOUNTER — Ambulatory Visit: Admission: RE | Admit: 2015-02-08 | Payer: BLUE CROSS/BLUE SHIELD | Source: Ambulatory Visit

## 2015-02-08 ENCOUNTER — Ambulatory Visit: Payer: BLUE CROSS/BLUE SHIELD

## 2015-02-09 ENCOUNTER — Ambulatory Visit: Payer: BLUE CROSS/BLUE SHIELD

## 2015-02-09 ENCOUNTER — Ambulatory Visit
Admission: RE | Admit: 2015-02-09 | Discharge: 2015-02-09 | Disposition: A | Discharge status: Home / Self Care | Payer: BLUE CROSS/BLUE SHIELD | Source: Ambulatory Visit | Attending: Radiation Oncology | Admitting: Radiation Oncology

## 2015-02-09 DIAGNOSIS — C50912 Malignant neoplasm of unspecified site of left female breast: Secondary | ICD-10-CM | POA: Diagnosis not present

## 2015-02-12 ENCOUNTER — Ambulatory Visit
Admission: RE | Admit: 2015-02-12 | Discharge: 2015-02-12 | Disposition: A | Discharge status: Home / Self Care | Payer: BLUE CROSS/BLUE SHIELD | Source: Ambulatory Visit | Attending: Radiation Oncology | Admitting: Radiation Oncology

## 2015-02-12 ENCOUNTER — Ambulatory Visit: Payer: BLUE CROSS/BLUE SHIELD

## 2015-02-12 DIAGNOSIS — C50912 Malignant neoplasm of unspecified site of left female breast: Secondary | ICD-10-CM | POA: Diagnosis not present

## 2015-02-13 ENCOUNTER — Ambulatory Visit: Admission: RE | Admit: 2015-02-13 | Payer: BLUE CROSS/BLUE SHIELD | Source: Ambulatory Visit

## 2015-02-14 ENCOUNTER — Ambulatory Visit: Payer: BLUE CROSS/BLUE SHIELD

## 2015-02-15 ENCOUNTER — Ambulatory Visit
Admission: RE | Admit: 2015-02-15 | Discharge: 2015-02-15 | Disposition: A | Discharge status: Home / Self Care | Payer: BLUE CROSS/BLUE SHIELD | Source: Ambulatory Visit | Attending: Radiation Oncology | Admitting: Radiation Oncology

## 2015-02-15 ENCOUNTER — Ambulatory Visit: Payer: BLUE CROSS/BLUE SHIELD

## 2015-02-15 DIAGNOSIS — C50912 Malignant neoplasm of unspecified site of left female breast: Secondary | ICD-10-CM | POA: Diagnosis not present

## 2015-02-16 ENCOUNTER — Ambulatory Visit
Admission: RE | Admit: 2015-02-16 | Discharge: 2015-02-16 | Disposition: A | Discharge status: Home / Self Care | Payer: BLUE CROSS/BLUE SHIELD | Source: Ambulatory Visit | Attending: Radiation Oncology | Admitting: Radiation Oncology

## 2015-02-16 ENCOUNTER — Ambulatory Visit: Payer: BLUE CROSS/BLUE SHIELD

## 2015-02-16 DIAGNOSIS — C50912 Malignant neoplasm of unspecified site of left female breast: Secondary | ICD-10-CM | POA: Diagnosis not present

## 2015-02-19 ENCOUNTER — Ambulatory Visit: Payer: BLUE CROSS/BLUE SHIELD

## 2015-02-19 ENCOUNTER — Ambulatory Visit
Admission: RE | Admit: 2015-02-19 | Discharge: 2015-02-19 | Disposition: A | Discharge status: Home / Self Care | Payer: BLUE CROSS/BLUE SHIELD | Source: Ambulatory Visit | Attending: Radiation Oncology | Admitting: Radiation Oncology

## 2015-02-19 DIAGNOSIS — C50912 Malignant neoplasm of unspecified site of left female breast: Secondary | ICD-10-CM | POA: Diagnosis not present

## 2015-02-20 ENCOUNTER — Ambulatory Visit
Admission: RE | Admit: 2015-02-20 | Discharge: 2015-02-20 | Disposition: A | Discharge status: Home / Self Care | Payer: BLUE CROSS/BLUE SHIELD | Source: Ambulatory Visit | Attending: Radiation Oncology | Admitting: Radiation Oncology

## 2015-02-20 ENCOUNTER — Ambulatory Visit: Payer: BLUE CROSS/BLUE SHIELD

## 2015-02-21 ENCOUNTER — Ambulatory Visit
Admission: RE | Admit: 2015-02-21 | Discharge: 2015-02-21 | Disposition: A | Discharge status: Home / Self Care | Payer: BLUE CROSS/BLUE SHIELD | Source: Ambulatory Visit | Attending: Radiation Oncology | Admitting: Radiation Oncology

## 2015-02-21 ENCOUNTER — Ambulatory Visit: Payer: BLUE CROSS/BLUE SHIELD

## 2015-02-21 DIAGNOSIS — C50912 Malignant neoplasm of unspecified site of left female breast: Secondary | ICD-10-CM | POA: Diagnosis not present

## 2015-02-22 ENCOUNTER — Ambulatory Visit
Admission: RE | Admit: 2015-02-22 | Discharge: 2015-02-22 | Disposition: A | Discharge status: Home / Self Care | Payer: BLUE CROSS/BLUE SHIELD | Source: Ambulatory Visit | Attending: Radiation Oncology | Admitting: Radiation Oncology

## 2015-02-22 ENCOUNTER — Ambulatory Visit: Payer: BLUE CROSS/BLUE SHIELD

## 2015-02-23 DIAGNOSIS — C50912 Malignant neoplasm of unspecified site of left female breast: Secondary | ICD-10-CM | POA: Diagnosis not present

## 2015-02-26 ENCOUNTER — Inpatient Hospital Stay: Payer: BLUE CROSS/BLUE SHIELD

## 2015-02-26 ENCOUNTER — Inpatient Hospital Stay: Payer: BLUE CROSS/BLUE SHIELD | Attending: Oncology | Admitting: Oncology

## 2015-02-26 ENCOUNTER — Encounter: Payer: Self-pay | Admitting: Oncology

## 2015-02-26 VITALS — BP 157/77 | HR 82 | Temp 97.4°F | Wt 224.2 lb

## 2015-02-26 DIAGNOSIS — Z923 Personal history of irradiation: Secondary | ICD-10-CM

## 2015-02-26 DIAGNOSIS — E785 Hyperlipidemia, unspecified: Secondary | ICD-10-CM | POA: Insufficient documentation

## 2015-02-26 DIAGNOSIS — K219 Gastro-esophageal reflux disease without esophagitis: Secondary | ICD-10-CM | POA: Diagnosis not present

## 2015-02-26 DIAGNOSIS — Z5112 Encounter for antineoplastic immunotherapy: Secondary | ICD-10-CM | POA: Insufficient documentation

## 2015-02-26 DIAGNOSIS — R5383 Other fatigue: Secondary | ICD-10-CM | POA: Insufficient documentation

## 2015-02-26 DIAGNOSIS — R531 Weakness: Secondary | ICD-10-CM | POA: Diagnosis not present

## 2015-02-26 DIAGNOSIS — Z171 Estrogen receptor negative status [ER-]: Secondary | ICD-10-CM | POA: Diagnosis not present

## 2015-02-26 DIAGNOSIS — C50912 Malignant neoplasm of unspecified site of left female breast: Secondary | ICD-10-CM

## 2015-02-26 DIAGNOSIS — Z79899 Other long term (current) drug therapy: Secondary | ICD-10-CM

## 2015-02-26 DIAGNOSIS — Z87891 Personal history of nicotine dependence: Secondary | ICD-10-CM | POA: Diagnosis not present

## 2015-02-26 DIAGNOSIS — I10 Essential (primary) hypertension: Secondary | ICD-10-CM | POA: Diagnosis not present

## 2015-02-26 DIAGNOSIS — F329 Major depressive disorder, single episode, unspecified: Secondary | ICD-10-CM | POA: Diagnosis not present

## 2015-02-26 DIAGNOSIS — Z85828 Personal history of other malignant neoplasm of skin: Secondary | ICD-10-CM | POA: Diagnosis not present

## 2015-02-26 DIAGNOSIS — Z803 Family history of malignant neoplasm of breast: Secondary | ICD-10-CM | POA: Diagnosis not present

## 2015-02-26 LAB — CBC WITH DIFFERENTIAL/PLATELET
BASOS ABS: 0.1 10*3/uL (ref 0–0.1)
BASOS PCT: 1 %
EOS ABS: 0.1 10*3/uL (ref 0–0.7)
Eosinophils Relative: 2 %
HEMATOCRIT: 40.7 % (ref 35.0–47.0)
Hemoglobin: 13.7 g/dL (ref 12.0–16.0)
Lymphocytes Relative: 29 %
Lymphs Abs: 2 10*3/uL (ref 1.0–3.6)
MCH: 27.7 pg (ref 26.0–34.0)
MCHC: 33.6 g/dL (ref 32.0–36.0)
MCV: 82.5 fL (ref 80.0–100.0)
MONO ABS: 0.5 10*3/uL (ref 0.2–0.9)
MONOS PCT: 8 %
NEUTROS ABS: 4 10*3/uL (ref 1.4–6.5)
Neutrophils Relative %: 60 %
PLATELETS: 160 10*3/uL (ref 150–440)
RBC: 4.93 MIL/uL (ref 3.80–5.20)
RDW: 13.3 % (ref 11.5–14.5)
WBC: 6.7 10*3/uL (ref 3.6–11.0)

## 2015-02-26 LAB — COMPREHENSIVE METABOLIC PANEL
ALBUMIN: 4.1 g/dL (ref 3.5–5.0)
ALT: 18 U/L (ref 14–54)
ANION GAP: 8 (ref 5–15)
AST: 23 U/L (ref 15–41)
Alkaline Phosphatase: 56 U/L (ref 38–126)
BILIRUBIN TOTAL: 0.8 mg/dL (ref 0.3–1.2)
BUN: 17 mg/dL (ref 6–20)
CHLORIDE: 103 mmol/L (ref 101–111)
CO2: 25 mmol/L (ref 22–32)
Calcium: 9 mg/dL (ref 8.9–10.3)
Creatinine, Ser: 1.22 mg/dL — ABNORMAL HIGH (ref 0.44–1.00)
GFR calc Af Amer: 53 mL/min — ABNORMAL LOW (ref >60)
GFR calc non Af Amer: 46 mL/min — ABNORMAL LOW (ref >60)
GLUCOSE: 98 mg/dL (ref 65–99)
POTASSIUM: 3.4 mmol/L — AB (ref 3.5–5.1)
SODIUM: 136 mmol/L (ref 135–145)
TOTAL PROTEIN: 7 g/dL (ref 6.5–8.1)

## 2015-02-26 MED ORDER — ACETAMINOPHEN 325 MG PO TABS
650.0000 mg | ORAL_TABLET | Freq: Once | ORAL | Status: AC
  Administered 2015-02-26: 650 mg via ORAL
  Filled 2015-02-26: qty 2

## 2015-02-26 MED ORDER — TRASTUZUMAB CHEMO INJECTION 440 MG
6.0000 mg/kg | Freq: Once | INTRAVENOUS | Status: AC
  Administered 2015-02-26: 588 mg via INTRAVENOUS
  Filled 2015-02-26: qty 28

## 2015-02-26 MED ORDER — HEPARIN SOD (PORK) LOCK FLUSH 100 UNIT/ML IV SOLN
500.0000 [IU] | Freq: Once | INTRAVENOUS | Status: AC | PRN
  Administered 2015-02-26: 500 [IU]
  Filled 2015-02-26: qty 5

## 2015-02-26 MED ORDER — SODIUM CHLORIDE 0.9 % IJ SOLN
10.0000 mL | Freq: Once | INTRAMUSCULAR | Status: AC
  Administered 2015-02-26: 10 mL via INTRAVENOUS
  Filled 2015-02-26: qty 10

## 2015-02-26 MED ORDER — DIPHENHYDRAMINE HCL 25 MG PO CAPS: 50.0000 mg | ORAL_CAPSULE | Freq: Once | ORAL | Status: DC

## 2015-02-26 MED ORDER — SODIUM CHLORIDE 0.9 % IV SOLN
Freq: Once | INTRAVENOUS | Status: AC
  Administered 2015-02-26: 15:00:00 via INTRAVENOUS
  Filled 2015-02-26: qty 1000

## 2015-02-26 NOTE — Progress Notes (Signed)
Patient offers no complains today.

## 2015-02-26 NOTE — Progress Notes (Signed)
[place] @ [facility] [phone]  Fax:(336) [place] OB: [birth year]  MR#: [id number]  IRW#:[id number]  Patient Care Team: Jerrol Banana., MD as PCP - General (Family Medicine) Robert Bellow, MD as Consulting Physician (General Surgery) Nicholaus Bloom, MD as Consulting Physician (Plastic Surgery) Tammy Rondell Reams, [place] (Family Medicine)  CHIEF COMPLAINT:  Chief Complaint  Patient presents with  . Breast Cancer   Oncology History   1.  Abnormal mammogram in April of 2016 followed by ultrasound and based on clinical examination 1 cm tumor.  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  (July 05, 2014) 2.  Status post lumpectomy and sentinel lymph node evaluation tumor size is 1.4 cm sentinel lymph nodes negative.  Stage IC T1 cN0 M0 tumor 3.  Patient is going for breast reduction surgery in June of 2016 4.  Adjuvant chemotherapy started with Taxol and Herceptin on September 04, 2014   5.  Patient received total 10 treatment of Taxol and has been discontinued course of progressing neuropathy Patient was referred to radiation therapy (November 15, 2014)   Oncology Flowsheet 10/30/2014 11/06/2014 11/15/2014 12/04/2014 12/25/2014 01/15/2015 02/05/2015  Day, Cycle Day 15, Cycle 3 Day 1, Cycle 4 - - - - -  dexamethasone (DECADRON) IV [ 10 mg ] [ 10 mg ] - - - - -  diphenhydrAMINE (BENADRYL) PO - - 50 mg 25 mg - 25 mg -  ondansetron (ZOFRAN) IV [ 8 mg ] [ 8 mg ] - - - - -  PACLitaxel (TAXOL) IV 72 mg/m2 60 mg/m2 - - - - -  trastuzumab (HERCEPTIN) IV - - 6 mg/kg 6 mg/kg 6 mg/kg 6 mg/kg 6 mg/kg    INTERVAL HISTORY: 64 year old lady here for continuation of chemotherapy with Taxol and Herceptin 1.4 cm tumor which is HER-2 positive.  Estrogen and progesterone receptor negative. Patient is here for ongoing evaluation for ongoing evaluation and treatment  consideration. Patient is here for continuation of chemotherapy. No chills fever appetite has been stable No shortness of breath no swelling of lower extremity no chest pain   REVIEW OF SYSTEMS:    general status: Patient is feeling weak and tired.  No change in a performance status.  No chills.  No fever. HEENT:   No evidence of stomatitis Lungs: No cough or shortness of breath Cardiac: No chest pain or paroxysmal nocturnal dyspnea GI:as per interval history.  New-onset of nausea vomiting diarrhea Skin: No rash Lower extremity no swelling Neurological system: No tingling.  No numbness.  No other focal signs Musculoskeletal system no bony pains . As per HPI. Otherwise, a complete review of systems is negatve.  PAST MEDICAL HISTORY: Past Medical History  Diagnosis Date  . Hypertension   . Hyperlipidemia   . GERD (gastroesophageal reflux disease)   . Depression 1995  . Cancer ([place])     skin   . Carcinoma of left breast ([place]) 07/30/2014    1.4 cm, T1c, N0, ER negative, PR negative, HER-2 positive  . Complication of anesthesia 2004    oversensitive to noise, lights    PAST SURGICAL HISTORY: Past Surgical History  Procedure Laterality Date  . Back surgery    . Tonsillectomy    . Skin cancer  removal  2015    nose   . Colonoscopy  2013    Dr. Vira Agar  . Breast lumpectomy with axillary lymph  node dissection Left 08/03/2014    Procedure: BREAST LUMPECTOMY WITH AXILLARY LYMPH NODE DISSECTION;  Surgeon: Robert Bellow, MD;  Location: [facility] ORS;  Service: General;  Laterality: Left;  . Sentinel node biopsy Left 08/03/2014    Procedure: SENTINEL NODE BIOPSY;  Surgeon: Robert Bellow, MD;  Location: [facility] ORS;  Service: General;  Laterality: Left;  . Axillary lymph node biopsy Left 08/03/2014    Procedure: AXILLARY LYMPH NODE BIOPSY;  Surgeon: Robert Bellow, MD;  Location: [facility] ORS;  Service: General;  Laterality: Left;  . Breast surgery Left Aug 03, 2014    Wide excision,  sentinel node biopsy.  . Portacath placement Right 08/31/2014    Procedure: INSERTION PORT-A-CATH;  Surgeon: Robert Bellow, MD;  Location: [facility] ORS;  Service: General;  Laterality: Right;    FAMILY HISTORY Family History  Problem Relation Age of Onset  . Breast cancer Mother 45       . Breast cancer Cousin 62         ADVANCED DIRECTIVES:  Patient does not have any living will or healthcare power of attorney.  Information was given .  Available resources had been discussed.  We will follow-up on subsequent appointments regarding this issue  HEALTH MAINTENANCE: Social History  Substance Use Topics  . Smoking status: Former Smoker -- 1.00 packs/day for 2 years    Types: Cigarettes    Quit date: 07/30/1976  . Smokeless tobacco: None  . Alcohol Use: 0.0 - 1.2 oz/week    0-1 Glasses of wine, 0-1 Shots of liquor, 0 Standard drinks or equivalent per week      Allergies  Allergen Reactions  . Sulfa Antibiotics Hives and Rash    Current Outpatient Prescriptions  Medication Sig Dispense Refill  . chlorpheniramine-HYDROcodone (TUSSIONEX) 10-8 MG/5ML SUER Take 5 mLs by mouth every 12 (twelve) hours as needed for cough. 140 mL 0  . gabapentin (NEURONTIN) 100 MG capsule Take 1 capsule (100 mg total) by mouth 3 (three) times daily. 90 capsule 3  . HYDROcodone-acetaminophen (NORCO) 5-325 MG per tablet Take 1-2 tablets by mouth every 4 (four) hours as needed for moderate pain or severe pain. 30 tablet 0  . KLOR-CON M20 20 MEQ tablet Take 2 tablets (40 mEq total) by mouth 2 (two) times daily. 90 tablet 3  . lidocaine-prilocaine (EMLA) cream Apply 1 application topically as needed. 30 g 3  . omeprazole (PRILOSEC) 40 MG capsule Take 40 mg by mouth every morning.   4  . ondansetron (ZOFRAN) 8 MG tablet Take 1 tablet (8 mg total) by mouth 2 (two) times daily. Start the day after chemo for 2 days. Then take as needed for nausea or vomiting. 30 tablet 1  . pravastatin (PRAVACHOL) 40 MG tablet  Take 40 mg by mouth every morning.   3  . sertraline (ZOLOFT) 100 MG tablet Take 100 mg by mouth every morning.   4  . silver sulfADIAZINE (SILVADENE) 1 % cream Apply 1 application topically 2 (two) times daily. 50 g 2  . triamterene-hydrochlorothiazide (MAXZIDE) 75-50 MG per tablet Take 1 tablet by mouth every morning.   4   No current facility-administered medications for this visit.   Facility-Administered Medications Ordered in Other Visits  Medication Dose Route Frequency Provider Last Rate Last Dose  . acetaminophen (TYLENOL) tablet 650 mg  650 mg Oral Once Forest Gleason, MD      . diphenhydrAMINE (BENADRYL) capsule 50 mg  50 mg Oral Once Forest Gleason, MD      .  heparin lock flush 100 unit/mL  500 Units Intracatheter Once PRN Forest Gleason, MD      . sodium chloride 0.9 % injection 10 mL  10 mL Intravenous PRN Forest Gleason, MD   10 mL at 09/25/14 1418  . sodium chloride 0.9 % injection 10 mL  10 mL Intravenous PRN Forest Gleason, MD   10 mL at 01/15/15 1325    OBJECTIVE:  Filed Vitals:   02/26/15 1352  BP: 157/77  Pulse: 82  Temp: 97.4 F (36.3 C)     Body mass index is 37.31 kg/(m^2).    ECOG FS:0 - Asymptomatic  PHYSICAL EXAM: General  status: Performance status is good.  Patient has not lost significant weight HEENT: No evidence of stomatitis. Sclera and conjunctivae :: No jaundice.   pale looking. Lungs: Air  entry equal on both sides.  No rhonchi.  No rales.  Cardiac: Heart sounds are normal.  No pericardial rub.  No murmur. Lymphatic system: Cervical, axillary, inguinal, lymph nodes not palpable GI: Abdomen is soft.  No ascites.  Liver spleen not palpable.  No tenderness.  Bowel sounds are within normal limit Lower extremity: No edema Neurological system: Higher functions, cranial nerves intact . 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  No ecchymosis.. Bilateral breast mammoplasty  Tolerating radiation therapy without any redness or  skin changes Port site within normal limit healing well. Alopecia. No stomatitis   LAB RESULTS:  No visits with results within 2 Day(s) from this visit. Latest known visit with results is:  Infusion on 02/05/2015  Component Date Value Ref Range Status  . WBC 02/05/2015 8.0  3.6 - 11.0 K/uL Final   A-LINE DRAW  . RBC 02/05/2015 5.01  3.80 - 5.20 MIL/uL Final  . Hemoglobin 02/05/2015 14.2  12.0 - 16.0 g/dL Final  . HCT 02/05/2015 42.4  35.0 - 47.0 % Final  . MCV 02/05/2015 84.6  80.0 - 100.0 fL Final  . MCH 02/05/2015 28.2  26.0 - 34.0 pg Final  . MCHC 02/05/2015 33.4  32.0 - 36.0 g/dL Final  . RDW 02/05/2015 13.4  11.5 - 14.5 % Final  . Platelets 02/05/2015 185  150 - 440 K/uL Final  . Neutrophils Relative % 02/05/2015 58   Final  . Neutro Abs 02/05/2015 4.7  1.4 - 6.5 K/uL Final  . Lymphocytes Relative 02/05/2015 29   Final  . Lymphs Abs 02/05/2015 2.3  1.0 - 3.6 K/uL Final  . Monocytes Relative 02/05/2015 9   Final  . Monocytes Absolute 02/05/2015 0.7  0.2 - 0.9 K/uL Final  . Eosinophils Relative 02/05/2015 2   Final  . Eosinophils Absolute 02/05/2015 0.1  0 - 0.7 K/uL Final  . Basophils Relative 02/05/2015 2   Final  . Basophils Absolute 02/05/2015 0.1  0 - 0.1 K/uL Final  . Sodium 02/05/2015 136  135 - 145 mmol/L Final  . Potassium 02/05/2015 3.5  3.5 - 5.1 mmol/L Final  . Chloride 02/05/2015 101  101 - 111 mmol/L Final  . CO2 02/05/2015 26  22 - 32 mmol/L Final  . Glucose, Bld 02/05/2015 93  65 - 99 mg/dL Final  . BUN 02/05/2015 21* 6 - 20 mg/dL Final  . Creatinine, Ser 02/05/2015 1.17* 0.44 - 1.00 mg/dL Final  . Calcium 02/05/2015 9.0  8.9 - 10.3 mg/dL Final  . Total Protein 02/05/2015 7.5  6.5 - 8.1 g/dL Final  . Albumin 02/05/2015 3.9  3.5 - 5.0 g/dL Final  .  AST 02/05/2015 23  15 - 41 U/L Final  . ALT 02/05/2015 18  14 - 54 U/L Final  . Alkaline Phosphatase 02/05/2015 58  38 - 126 U/L Final  . Total Bilirubin 02/05/2015 0.9  0.3 - 1.2 mg/dL Final  . GFR calc non  Af Amer 02/05/2015 48* >60 mL/min Final  . GFR calc Af Amer 02/05/2015 56* >60 mL/min Final   Comment: (NOTE) The eGFR has been calculated using the CKD EPI equation. This calculation has not been validated in all clinical situations. eGFR's persistently <60 mL/min signify possible Chronic Kidney Disease.   . Anion gap 02/05/2015 9  5 - 15 Final       ASSESSMENT: Carcinoma of breast status post lumpectomy and sentinel lymph node (left breast) stage IC Estrogen and progesterone receptor negative HER-2/neu positive tumor Status post mammoplasty on both breast All lab data has been reviewed. Continue Herceptin therapy. Repeat MUGA scan of the heartburn third November, 2016 has been reviewed.  Ejection fraction is 57% (previously was 62%) Continue Herceptin  MEDICAL DECISION MAKING:   Continue Herceptin All lab data has been reviewed  MUGA scan of the heart     Has  been reviewed independently  Patient expressed understanding and was in agreement with this plan. She also understands that She can call clinic at any time with any questions, concerns, or complaints.    Carcinoma of left breast   Staging form: Breast, AJCC 7th Edition     Clinical: Stage IA (T1c, N0, M0) - Marni Griffon, MD   02/26/2015 2:01 PM

## 2015-02-27 ENCOUNTER — Telehealth: Payer: Self-pay | Admitting: *Deleted

## 2015-02-27 NOTE — Telephone Encounter (Signed)
States she broke her tooth and needs to know if it is ok to have it fixed. Per Dr Oliva Bustard, [place] to get it repaired

## 2015-03-19 ENCOUNTER — Inpatient Hospital Stay: Payer: BLUE CROSS/BLUE SHIELD | Admitting: Oncology

## 2015-03-19 ENCOUNTER — Inpatient Hospital Stay: Payer: BLUE CROSS/BLUE SHIELD

## 2015-03-26 ENCOUNTER — Other Ambulatory Visit: Payer: BLUE CROSS/BLUE SHIELD

## 2015-03-26 ENCOUNTER — Ambulatory Visit: Payer: BLUE CROSS/BLUE SHIELD | Admitting: Oncology

## 2015-03-26 ENCOUNTER — Ambulatory Visit: Payer: BLUE CROSS/BLUE SHIELD

## 2015-03-27 ENCOUNTER — Inpatient Hospital Stay: Payer: BLUE CROSS/BLUE SHIELD

## 2015-03-27 ENCOUNTER — Inpatient Hospital Stay: Payer: BLUE CROSS/BLUE SHIELD | Attending: Oncology | Admitting: Oncology

## 2015-03-27 VITALS — BP 130/76 | HR 74 | Temp 96.6°F | Resp 18 | Wt 224.9 lb

## 2015-03-27 DIAGNOSIS — K219 Gastro-esophageal reflux disease without esophagitis: Secondary | ICD-10-CM | POA: Diagnosis not present

## 2015-03-27 DIAGNOSIS — Z85828 Personal history of other malignant neoplasm of skin: Secondary | ICD-10-CM | POA: Insufficient documentation

## 2015-03-27 DIAGNOSIS — M2550 Pain in unspecified joint: Secondary | ICD-10-CM | POA: Insufficient documentation

## 2015-03-27 DIAGNOSIS — Z79899 Other long term (current) drug therapy: Secondary | ICD-10-CM | POA: Diagnosis not present

## 2015-03-27 DIAGNOSIS — Z5112 Encounter for antineoplastic immunotherapy: Secondary | ICD-10-CM | POA: Insufficient documentation

## 2015-03-27 DIAGNOSIS — C50912 Malignant neoplasm of unspecified site of left female breast: Secondary | ICD-10-CM

## 2015-03-27 DIAGNOSIS — R531 Weakness: Secondary | ICD-10-CM | POA: Insufficient documentation

## 2015-03-27 DIAGNOSIS — Z87891 Personal history of nicotine dependence: Secondary | ICD-10-CM | POA: Diagnosis not present

## 2015-03-27 DIAGNOSIS — F329 Major depressive disorder, single episode, unspecified: Secondary | ICD-10-CM | POA: Diagnosis not present

## 2015-03-27 DIAGNOSIS — R5383 Other fatigue: Secondary | ICD-10-CM | POA: Diagnosis not present

## 2015-03-27 DIAGNOSIS — E785 Hyperlipidemia, unspecified: Secondary | ICD-10-CM | POA: Diagnosis not present

## 2015-03-27 DIAGNOSIS — C801 Malignant (primary) neoplasm, unspecified: Secondary | ICD-10-CM

## 2015-03-27 DIAGNOSIS — Z803 Family history of malignant neoplasm of breast: Secondary | ICD-10-CM | POA: Diagnosis not present

## 2015-03-27 DIAGNOSIS — I10 Essential (primary) hypertension: Secondary | ICD-10-CM | POA: Insufficient documentation

## 2015-03-27 DIAGNOSIS — Z171 Estrogen receptor negative status [ER-]: Secondary | ICD-10-CM | POA: Diagnosis not present

## 2015-03-27 LAB — CBC WITH DIFFERENTIAL/PLATELET
BASOS ABS: 0.1 10*3/uL (ref 0–0.1)
BASOS PCT: 1 %
EOS ABS: 0.1 10*3/uL (ref 0–0.7)
Eosinophils Relative: 2 %
HCT: 41.7 % (ref 35.0–47.0)
HEMOGLOBIN: 13.8 g/dL (ref 12.0–16.0)
Lymphocytes Relative: 34 %
Lymphs Abs: 2.2 10*3/uL (ref 1.0–3.6)
MCH: 27.5 pg (ref 26.0–34.0)
MCHC: 33.2 g/dL (ref 32.0–36.0)
MCV: 82.7 fL (ref 80.0–100.0)
Monocytes Absolute: 0.5 10*3/uL (ref 0.2–0.9)
Monocytes Relative: 8 %
NEUTROS PCT: 55 %
Neutro Abs: 3.6 10*3/uL (ref 1.4–6.5)
Platelets: 158 10*3/uL (ref 150–440)
RBC: 5.04 MIL/uL (ref 3.80–5.20)
RDW: 14.4 % (ref 11.5–14.5)
WBC: 6.5 10*3/uL (ref 3.6–11.0)

## 2015-03-27 LAB — COMPREHENSIVE METABOLIC PANEL
ALT: 17 U/L (ref 14–54)
AST: 22 U/L (ref 15–41)
Albumin: 3.9 g/dL (ref 3.5–5.0)
Alkaline Phosphatase: 56 U/L (ref 38–126)
Anion gap: 8 (ref 5–15)
BUN: 18 mg/dL (ref 6–20)
CHLORIDE: 103 mmol/L (ref 101–111)
CO2: 24 mmol/L (ref 22–32)
Calcium: 8.8 mg/dL — ABNORMAL LOW (ref 8.9–10.3)
Creatinine, Ser: 1.09 mg/dL — ABNORMAL HIGH (ref 0.44–1.00)
GFR, EST NON AFRICAN AMERICAN: 52 mL/min — AB (ref >60)
Glucose, Bld: 127 mg/dL — ABNORMAL HIGH (ref 65–99)
POTASSIUM: 3.2 mmol/L — AB (ref 3.5–5.1)
Sodium: 135 mmol/L (ref 135–145)
Total Bilirubin: 1 mg/dL (ref 0.3–1.2)
Total Protein: 7.3 g/dL (ref 6.5–8.1)

## 2015-03-27 LAB — MAGNESIUM: MAGNESIUM: 1.8 mg/dL (ref 1.7–2.4)

## 2015-03-27 MED ORDER — ACETAMINOPHEN 325 MG PO TABS
650.0000 mg | ORAL_TABLET | Freq: Once | ORAL | Status: AC
  Administered 2015-03-27: 650 mg via ORAL
  Filled 2015-03-27: qty 2

## 2015-03-27 MED ORDER — SODIUM CHLORIDE 0.9 % IV SOLN
Freq: Once | INTRAVENOUS | Status: AC
  Administered 2015-03-27: 15:00:00 via INTRAVENOUS
  Filled 2015-03-27: qty 1000

## 2015-03-27 MED ORDER — SODIUM CHLORIDE 0.9 % IJ SOLN
10.0000 mL | INTRAMUSCULAR | Status: DC | PRN
  Administered 2015-03-27: 10 mL
  Filled 2015-03-27: qty 10

## 2015-03-27 MED ORDER — HEPARIN SOD (PORK) LOCK FLUSH 100 UNIT/ML IV SOLN
500.0000 [IU] | Freq: Once | INTRAVENOUS | Status: AC | PRN
  Administered 2015-03-27: 500 [IU]

## 2015-03-27 MED ORDER — DIPHENHYDRAMINE HCL 25 MG PO CAPS: 25.0000 mg | ORAL_CAPSULE | Freq: Once | ORAL | Status: AC

## 2015-03-27 MED ORDER — HEPARIN SOD (PORK) LOCK FLUSH 100 UNIT/ML IV SOLN
INTRAVENOUS | Status: AC
  Filled 2015-03-27: qty 5

## 2015-03-27 MED ORDER — TRASTUZUMAB CHEMO INJECTION 440 MG
6.0000 mg/kg | Freq: Once | INTRAVENOUS | Status: AC
  Administered 2015-03-27: 588 mg via INTRAVENOUS
  Filled 2015-03-27: qty 28

## 2015-03-27 MED ORDER — DIPHENHYDRAMINE HCL 25 MG PO CAPS
50.0000 mg | ORAL_CAPSULE | Freq: Once | ORAL | Status: AC
  Administered 2015-03-27: 25 mg via ORAL
  Filled 2015-03-27: qty 2

## 2015-03-27 NOTE — Progress Notes (Signed)
Patient states she is having some joint pain. Right thumb joint is swollen and sore today. Patient states she has sharp pains occasionally in her left breast.

## 2015-03-28 ENCOUNTER — Encounter: Payer: Self-pay | Admitting: Oncology

## 2015-03-28 NOTE — Progress Notes (Signed)
[place] @ [facility] Telephone:(336) [phone]  Fax:(336) [place] OB: [birth year]  MR#: [id number]  OQH#:[id number]  Patient Care Team: Jerrol Banana., MD as PCP - General (Family Medicine) Robert Bellow, MD as Consulting Physician (General Surgery) Nicholaus Bloom, MD as Consulting Physician (Plastic Surgery) Tammy Rondell Reams, [place] (Family Medicine)  CHIEF COMPLAINT:  Chief Complaint  Patient presents with  . Breast Cancer   Oncology History   1.  Abnormal mammogram in April of 2016 followed by ultrasound and based on clinical examination 1 cm tumor.  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  (July 05, 2014) 2.  Status post lumpectomy and sentinel lymph node evaluation tumor size is 1.4 cm sentinel lymph nodes negative.  Stage IC T1 cN0 M0 tumor 3.  Patient is going for breast reduction surgery in June of 2016 4.  Adjuvant chemotherapy started with Taxol and Herceptin on September 04, 2014   5.  Patient received total 10 treatment of Taxol and has been discontinued course of progressing neuropathy Patient was referred to radiation therapy (November 15, 2014)   Oncology Flowsheet 11/15/2014 12/04/2014 12/25/2014 01/15/2015 02/05/2015 02/26/2015 03/27/2015  Day, Cycle - - - - - - -  dexamethasone (DECADRON) IV - - - - - - -  diphenhydrAMINE (BENADRYL) PO 50 mg 25 mg - 25 mg - - 25 mg  ondansetron (ZOFRAN) IV - - - - - - -  PACLitaxel (TAXOL) IV - - - - - - -  trastuzumab (HERCEPTIN) IV 6 mg/kg 6 mg/kg 6 mg/kg 6 mg/kg 6 mg/kg 6 mg/kg 6 mg/kg    INTERVAL HISTORY: 65 year old lady here for continuation of chemotherapy with Taxol and Herceptin 1.4 cm tumor which is HER-2 positive.  Estrogen and progesterone receptor negative. Patient is here for ongoing evaluation for ongoing evaluation and treatment consideration. Patient is here for continuation of chemotherapy. No  chills fever appetite has been stable No shortness of breath no swelling of lower extremity no chest pain Patient states she is having some joint pain. Right thumb joint is swollen and sore today. Patient states she has sharp pains occasionally in her left breast.   REVIEW OF SYSTEMS:    general status: Patient is feeling weak and tired.  No change in a performance status.  No chills.  No fever. HEENT:   No evidence of stomatitis Lungs: No cough or shortness of breath Cardiac: No chest pain or paroxysmal nocturnal dyspnea GI:as per interval history.  New-onset of nausea vomiting diarrhea Skin: No rash Lower extremity no swelling Neurological system: No tingling.  No numbness.  No other focal signs Musculoskeletal system no bony pains . As per HPI. Otherwise, a complete review of systems is negatve.  PAST MEDICAL HISTORY: Past Medical History  Diagnosis Date  . Hypertension   . Hyperlipidemia   . GERD (gastroesophageal reflux disease)   . Depression 1995  . Cancer ([place])     skin   . Carcinoma of left breast ([place]) 07/30/2014    1.4 cm, T1c, N0, ER negative, PR negative, HER-2 positive  . Complication of anesthesia 2004    oversensitive to noise, lights    PAST SURGICAL HISTORY: Past Surgical History  Procedure Laterality Date  . Back surgery    . Tonsillectomy    . Skin cancer  removal  2015    nose   . Colonoscopy  2013  Dr. Vira Agar  . Breast lumpectomy with axillary lymph node dissection Left 08/03/2014    Procedure: BREAST LUMPECTOMY WITH AXILLARY LYMPH NODE DISSECTION;  Surgeon: Robert Bellow, MD;  Location: [facility] ORS;  Service: General;  Laterality: Left;  . Sentinel node biopsy Left 08/03/2014    Procedure: SENTINEL NODE BIOPSY;  Surgeon: Robert Bellow, MD;  Location: [facility] ORS;  Service: General;  Laterality: Left;  . Axillary lymph node biopsy Left 08/03/2014    Procedure: AXILLARY LYMPH NODE BIOPSY;  Surgeon: Robert Bellow, MD;  Location: [facility] ORS;   Service: General;  Laterality: Left;  . Breast surgery Left Aug 03, 2014    Wide excision, sentinel node biopsy.  . Portacath placement Right 08/31/2014    Procedure: INSERTION PORT-A-CATH;  Surgeon: Robert Bellow, MD;  Location: [facility] ORS;  Service: General;  Laterality: Right;    FAMILY HISTORY Family History  Problem Relation Age of Onset  . Breast cancer Mother 34       . Breast cancer Cousin 62         ADVANCED DIRECTIVES:  Patient does not have any living will or healthcare power of attorney.  Information was given .  Available resources had been discussed.  We will follow-up on subsequent appointments regarding this issue  HEALTH MAINTENANCE: Social History  Substance Use Topics  . Smoking status: Former Smoker -- 1.00 packs/day for 2 years    Types: Cigarettes    Quit date: 07/30/1976  . Smokeless tobacco: None  . Alcohol Use: 0.0 - 1.2 oz/week    0-1 Glasses of wine, 0-1 Shots of liquor, 0 Standard drinks or equivalent per week      Allergies  Allergen Reactions  . Sulfa Antibiotics Hives and Rash    Current Outpatient Prescriptions  Medication Sig Dispense Refill  . chlorpheniramine-HYDROcodone (TUSSIONEX) 10-8 MG/5ML SUER Take 5 mLs by mouth every 12 (twelve) hours as needed for cough. 140 mL 0  . gabapentin (NEURONTIN) 100 MG capsule Take 1 capsule (100 mg total) by mouth 3 (three) times daily. 90 capsule 3  . HYDROcodone-acetaminophen (NORCO) 5-325 MG per tablet Take 1-2 tablets by mouth every 4 (four) hours as needed for moderate pain or severe pain. 30 tablet 0  . KLOR-CON M20 20 MEQ tablet Take 2 tablets (40 mEq total) by mouth 2 (two) times daily. 90 tablet 3  . lidocaine-prilocaine (EMLA) cream Apply 1 application topically as needed. 30 g 3  . omeprazole (PRILOSEC) 40 MG capsule Take 40 mg by mouth every morning.   4  . ondansetron (ZOFRAN) 8 MG tablet Take 1 tablet (8 mg total) by mouth 2 (two) times daily. Start the day after chemo for 2 days. Then  take as needed for nausea or vomiting. 30 tablet 1  . pravastatin (PRAVACHOL) 40 MG tablet Take 40 mg by mouth every morning.   3  . sertraline (ZOLOFT) 100 MG tablet Take 100 mg by mouth every morning.   4  . silver sulfADIAZINE (SILVADENE) 1 % cream Apply 1 application topically 2 (two) times daily. 50 g 2  . triamterene-hydrochlorothiazide (MAXZIDE) 75-50 MG per tablet Take 1 tablet by mouth every morning.   4   No current facility-administered medications for this visit.   Facility-Administered Medications Ordered in Other Visits  Medication Dose Route Frequency Provider Last Rate Last Dose  . acetaminophen (TYLENOL) tablet 650 mg  650 mg Oral Once Forest Gleason, MD      . diphenhydrAMINE (BENADRYL) capsule 25 mg  25 mg Oral Once Forest Gleason, MD   25 mg at 03/27/15 1605  . diphenhydrAMINE (BENADRYL) capsule 50 mg  50 mg Oral Once Forest Gleason, MD      . heparin lock flush 100 unit/mL  500 Units Intracatheter Once PRN Forest Gleason, MD      . sodium chloride 0.9 % injection 10 mL  10 mL Intravenous PRN Forest Gleason, MD   10 mL at 09/25/14 1418  . sodium chloride 0.9 % injection 10 mL  10 mL Intravenous PRN Forest Gleason, MD   10 mL at 01/15/15 1325  . sodium chloride 0.9 % injection 10 mL  10 mL Intracatheter PRN Forest Gleason, MD   10 mL at 03/27/15 1415    OBJECTIVE:  Filed Vitals:   03/27/15 1431  BP: 130/76  Pulse: 74  Temp: 96.6 F (35.9 C)  Resp: 18     Body mass index is 37.42 kg/(m^2).    ECOG FS:0 - Asymptomatic  PHYSICAL EXAM: General  status: Performance status is good.  Patient has not lost significant weight HEENT: No evidence of stomatitis. Sclera and conjunctivae :: No jaundice.   pale looking. Lungs: Air  entry equal on both sides.  No rhonchi.  No rales.  Cardiac: Heart sounds are normal.  No pericardial rub.  No murmur. Lymphatic system: Cervical, axillary, inguinal, lymph nodes not palpable GI: Abdomen is soft.  No ascites.  Liver spleen not palpable.  No  tenderness.  Bowel sounds are within normal limit Lower extremity: No edema Neurological system: Higher functions, cranial nerves intact . 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  No ecchymosis.. Bilateral breast mammoplasty  Tolerating radiation therapy without any redness or skin changes Port site within normal limit healing well. Alopecia. No stomatitis   LAB RESULTS:  Clinical Support on 03/27/2015  Component Date Value Ref Range Status  . WBC 03/27/2015 6.5  3.6 - 11.0 K/uL Final  . RBC 03/27/2015 5.04  3.80 - 5.20 MIL/uL Final  . Hemoglobin 03/27/2015 13.8  12.0 - 16.0 g/dL Final  . HCT 03/27/2015 41.7  35.0 - 47.0 % Final  . MCV 03/27/2015 82.7  80.0 - 100.0 fL Final  . MCH 03/27/2015 27.5  26.0 - 34.0 pg Final  . MCHC 03/27/2015 33.2  32.0 - 36.0 g/dL Final  . RDW 03/27/2015 14.4  11.5 - 14.5 % Final  . Platelets 03/27/2015 158  150 - 440 K/uL Final  . Neutrophils Relative % 03/27/2015 55   Final  . Neutro Abs 03/27/2015 3.6  1.4 - 6.5 K/uL Final  . Lymphocytes Relative 03/27/2015 34   Final  . Lymphs Abs 03/27/2015 2.2  1.0 - 3.6 K/uL Final  . Monocytes Relative 03/27/2015 8   Final  . Monocytes Absolute 03/27/2015 0.5  0.2 - 0.9 K/uL Final  . Eosinophils Relative 03/27/2015 2   Final  . Eosinophils Absolute 03/27/2015 0.1  0 - 0.7 K/uL Final  . Basophils Relative 03/27/2015 1   Final  . Basophils Absolute 03/27/2015 0.1  0 - 0.1 K/uL Final  . Sodium 03/27/2015 135  135 - 145 mmol/L Final  . Potassium 03/27/2015 3.2* 3.5 - 5.1 mmol/L Final  . Chloride 03/27/2015 103  101 - 111 mmol/L Final  . CO2 03/27/2015 24  22 - 32 mmol/L Final  . Glucose, Bld 03/27/2015 127* 65 - 99 mg/dL Final  . BUN 03/27/2015 18  6 - 20 mg/dL Final  . Creatinine, Ser 03/27/2015  1.09* 0.44 - 1.00 mg/dL Final  . Calcium 03/27/2015 8.8* 8.9 - 10.3 mg/dL Final  . Total Protein 03/27/2015 7.3  6.5 - 8.1 g/dL Final  . Albumin 03/27/2015 3.9  3.5 - 5.0 g/dL  Final  . AST 03/27/2015 22  15 - 41 U/L Final  . ALT 03/27/2015 17  14 - 54 U/L Final  . Alkaline Phosphatase 03/27/2015 56  38 - 126 U/L Final  . Total Bilirubin 03/27/2015 1.0  0.3 - 1.2 mg/dL Final  . GFR calc non Af Amer 03/27/2015 52* >60 mL/min Final  . GFR calc Af Amer 03/27/2015 >60  >60 mL/min Final   Comment: (NOTE) The eGFR has been calculated using the CKD EPI equation. This calculation has not been validated in all clinical situations. eGFR's persistently <60 mL/min signify possible Chronic Kidney Disease.   . Anion gap 03/27/2015 8  5 - 15 Final  . Magnesium 03/27/2015 1.8  1.7 - 2.4 mg/dL Final       ASSESSMENT: Carcinoma of breast status post lumpectomy and sentinel lymph node (left breast) stage IC Estrogen and progesterone receptor negative HER-2/neu positive tumor Status post mammoplasty on both breast All lab data has been reviewed. Continue Herceptin therapy. Repeat MUGA scan of the heartburn third November, 2016 has been reviewed.  Ejection fraction is 57% (previously was 62%) Continue Herceptin  MEDICAL DECISION MAKING:   Continue Herceptin All lab data has been reviewed  MUGA scan of the heart     Has  been reviewed independently We will continue Taxol chemotherapy.  Some of the joint pains may be related to previous Taxol administration. No physical findings to indicate swelling of any joints. She was reassured. [place] with another mammogram in April of 2017 Total duration of visit was 15 minutes or more and 50% of time was spent explaining some of the side effect of treatment need for continuing treatment  Patient expressed understanding and was in agreement with this plan. She also understands that She can call clinic at any time with any questions, concerns, or complaints.    Carcinoma of left breast   Staging form: Breast, AJCC 7th Edition     Clinical: Stage IA (T1c, N0, M0) - Marni Griffon, MD   03/28/2015 7:54 AM

## 2015-03-29 ENCOUNTER — Encounter: Payer: Self-pay | Admitting: Radiation Oncology

## 2015-03-29 ENCOUNTER — Ambulatory Visit
Admission: RE | Admit: 2015-03-29 | Discharge: 2015-03-29 | Disposition: A | Discharge status: Home / Self Care | Payer: BLUE CROSS/BLUE SHIELD | Source: Ambulatory Visit | Attending: Radiation Oncology | Admitting: Radiation Oncology

## 2015-03-29 VITALS — BP 136/73 | HR 81 | Temp 98.3°F | Resp 20 | Wt 225.3 lb

## 2015-03-29 DIAGNOSIS — C50912 Malignant neoplasm of unspecified site of left female breast: Secondary | ICD-10-CM

## 2015-03-29 NOTE — Progress Notes (Signed)
Radiation Oncology Follow up Note  Name: Madison Christensen   Date:   03/29/2015 MRN:  [id number] DOB: 11/09/1950    This 65 y.o. 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  REFERRING PROVIDER: Forest Gleason, MD  HPI: Patient is a 65 year old female now out 1 month having completed radiation therapy to her left breast for stage I T1 CN 0 ER/PR negative HER-2/neu positive invasive mammary carcinoma. Patient admitted status post bilateral breast reconstruction. She's also treated with adjuvant chemotherapy continues on Herceptin therapy. She is seen today one month out from radiation is doing well. She specifically denies breast tenderness cough or bone pain. She is not on aromatase inhibitor based on the ER/PR negative nature of her disease..  COMPLICATIONS OF TREATMENT: none  FOLLOW UP COMPLIANCE: keeps appointments   PHYSICAL EXAM:  BP 136/73 mmHg  Pulse 81  Temp(Src) 98.3 F (36.8 C)  Resp 20  Wt 225 lb 5 oz (102.2 kg) Patient is status post bilateral breast reduction surgery. No dominant mass or nodularity is noted in the right breast. There is a thickened area consistent with her lumpectomy site in the left breast unchanged from prior exam. No other dominant masses noted in either breast. No axillary or supraclavicular adenopathy is appreciated. Well-developed well-nourished patient in NAD. HEENT reveals PERLA, EOMI, discs not visualized.  Oral cavity is clear. No oral mucosal lesions are identified. Neck is clear without evidence of cervical or supraclavicular adenopathy. Lungs are clear to A&P. Cardiac examination is essentially unremarkable with regular rate and rhythm without murmur rub or thrill. Abdomen is benign with no organomegaly or masses noted. Motor sensory and DTR levels are equal and symmetric in the  upper and lower extremities. Cranial nerves II through XII are grossly intact. Proprioception is intact. No peripheral adenopathy or edema is identified. No motor or sensory levels are noted. Crude visual fields are within normal range.  RADIOLOGY RESULTS: No current films for review  PLAN: Present time she is doing well 1 month out. She continues on Herceptin therapy under medical oncology's direction. I've asked to see around 4-5 months for follow-up. Patient knows to call sooner with any concerns. Will not be aromatase inhibitor therapy based on her hormone receptor status.  I would like to take this opportunity for allowing me to participate in the care of your patient.Armstead Peaks., MD

## 2015-04-04 ENCOUNTER — Telehealth: Payer: Self-pay | Admitting: *Deleted

## 2015-04-04 DIAGNOSIS — C50912 Malignant neoplasm of unspecified site of left female breast: Secondary | ICD-10-CM

## 2015-04-04 MED ORDER — GABAPENTIN 100 MG PO CAPS: 100.0000 mg | ORAL_CAPSULE | Freq: Three times a day (TID) | ORAL | Status: DC

## 2015-04-04 NOTE — Telephone Encounter (Signed)
Escribed

## 2015-04-05 ENCOUNTER — Encounter: Payer: Self-pay | Admitting: *Deleted

## 2015-04-05 ENCOUNTER — Other Ambulatory Visit: Payer: Self-pay | Admitting: *Deleted

## 2015-04-05 DIAGNOSIS — C50912 Malignant neoplasm of unspecified site of left female breast: Secondary | ICD-10-CM

## 2015-04-05 NOTE — Progress Notes (Signed)
  Oncology Nurse Navigator Documentation  Navigator Location: [facility] (04/05/15 1500) Navigator Encounter Type: Telephone (04/05/15 1500)               Barriers/Navigation Needs: Coordination of Care (04/05/15 1500)   Interventions: Referrals (04/05/15 1500) Referrals: Rehab (04/05/15 1500)          Acuity: Level 2 (04/05/15 1500)  Patient called and stated she is so fatigued she can only work 4 days a week.  States she is not a complainer, but something has to change.  Talked with Hildred Alamin, RN Dr. Metro Kung nurse and a CARE referral was made for rehababilitation.  She can also resume her multivitamin, and Dr. Oliva Bustard will discuss with patient at her next visit changing her Zoloft to possible Celexa.  Discussed plan with the patient and she will start the CARE program next week.  She is to call if she has any questions or needs.       Time Spent with Patient: 30 (04/05/15 1500)

## 2015-04-06 ENCOUNTER — Encounter: Payer: Self-pay | Admitting: *Deleted

## 2015-04-17 ENCOUNTER — Inpatient Hospital Stay (HOSPITAL_BASED_OUTPATIENT_CLINIC_OR_DEPARTMENT_OTHER): Payer: BLUE CROSS/BLUE SHIELD | Admitting: Oncology

## 2015-04-17 ENCOUNTER — Inpatient Hospital Stay: Payer: BLUE CROSS/BLUE SHIELD | Attending: Oncology

## 2015-04-17 ENCOUNTER — Inpatient Hospital Stay: Payer: BLUE CROSS/BLUE SHIELD

## 2015-04-17 ENCOUNTER — Encounter: Payer: Self-pay | Admitting: Oncology

## 2015-04-17 VITALS — BP 160/81 | HR 85 | Temp 96.8°F | Resp 18 | Wt 225.5 lb

## 2015-04-17 DIAGNOSIS — Z87891 Personal history of nicotine dependence: Secondary | ICD-10-CM

## 2015-04-17 DIAGNOSIS — Z85828 Personal history of other malignant neoplasm of skin: Secondary | ICD-10-CM | POA: Insufficient documentation

## 2015-04-17 DIAGNOSIS — C171 Malignant neoplasm of jejunum: Secondary | ICD-10-CM

## 2015-04-17 DIAGNOSIS — Z9221 Personal history of antineoplastic chemotherapy: Secondary | ICD-10-CM

## 2015-04-17 DIAGNOSIS — C801 Malignant (primary) neoplasm, unspecified: Secondary | ICD-10-CM

## 2015-04-17 DIAGNOSIS — R531 Weakness: Secondary | ICD-10-CM

## 2015-04-17 DIAGNOSIS — K219 Gastro-esophageal reflux disease without esophagitis: Secondary | ICD-10-CM | POA: Insufficient documentation

## 2015-04-17 DIAGNOSIS — M2550 Pain in unspecified joint: Secondary | ICD-10-CM

## 2015-04-17 DIAGNOSIS — F329 Major depressive disorder, single episode, unspecified: Secondary | ICD-10-CM | POA: Insufficient documentation

## 2015-04-17 DIAGNOSIS — E785 Hyperlipidemia, unspecified: Secondary | ICD-10-CM | POA: Diagnosis not present

## 2015-04-17 DIAGNOSIS — I10 Essential (primary) hypertension: Secondary | ICD-10-CM

## 2015-04-17 DIAGNOSIS — Z803 Family history of malignant neoplasm of breast: Secondary | ICD-10-CM | POA: Insufficient documentation

## 2015-04-17 DIAGNOSIS — R5383 Other fatigue: Secondary | ICD-10-CM

## 2015-04-17 DIAGNOSIS — Z923 Personal history of irradiation: Secondary | ICD-10-CM | POA: Diagnosis not present

## 2015-04-17 DIAGNOSIS — Z79899 Other long term (current) drug therapy: Secondary | ICD-10-CM

## 2015-04-17 DIAGNOSIS — Z171 Estrogen receptor negative status [ER-]: Secondary | ICD-10-CM | POA: Insufficient documentation

## 2015-04-17 DIAGNOSIS — G629 Polyneuropathy, unspecified: Secondary | ICD-10-CM

## 2015-04-17 DIAGNOSIS — C50912 Malignant neoplasm of unspecified site of left female breast: Secondary | ICD-10-CM | POA: Diagnosis present

## 2015-04-17 LAB — COMPREHENSIVE METABOLIC PANEL
ALT: 23 U/L (ref 14–54)
AST: 26 U/L (ref 15–41)
Albumin: 4 g/dL (ref 3.5–5.0)
Alkaline Phosphatase: 56 U/L (ref 38–126)
Anion gap: 6 (ref 5–15)
BUN: 15 mg/dL (ref 6–20)
CHLORIDE: 103 mmol/L (ref 101–111)
CO2: 28 mmol/L (ref 22–32)
Calcium: 9 mg/dL (ref 8.9–10.3)
Creatinine, Ser: 1.03 mg/dL — ABNORMAL HIGH (ref 0.44–1.00)
GFR calc Af Amer: >60 mL/min (ref >60)
GFR, EST NON AFRICAN AMERICAN: 56 mL/min — AB (ref >60)
Glucose, Bld: 90 mg/dL (ref 65–99)
POTASSIUM: 3.2 mmol/L — AB (ref 3.5–5.1)
SODIUM: 137 mmol/L (ref 135–145)
Total Bilirubin: 0.8 mg/dL (ref 0.3–1.2)
Total Protein: 7 g/dL (ref 6.5–8.1)

## 2015-04-17 LAB — CBC
HCT: 41.9 % (ref 35.0–47.0)
HEMOGLOBIN: 14.2 g/dL (ref 12.0–16.0)
MCH: 27.9 pg (ref 26.0–34.0)
MCHC: 33.9 g/dL (ref 32.0–36.0)
MCV: 82.1 fL (ref 80.0–100.0)
PLATELETS: 161 10*3/uL (ref 150–440)
RBC: 5.1 MIL/uL (ref 3.80–5.20)
RDW: 14.5 % (ref 11.5–14.5)
WBC: 7.5 10*3/uL (ref 3.6–11.0)

## 2015-04-17 LAB — MAGNESIUM: MAGNESIUM: 1.8 mg/dL (ref 1.7–2.4)

## 2015-04-17 MED ORDER — HEPARIN SOD (PORK) LOCK FLUSH 100 UNIT/ML IV SOLN
500.0000 [IU] | Freq: Once | INTRAVENOUS | Status: AC
  Administered 2015-04-17: 500 [IU] via INTRAVENOUS

## 2015-04-17 MED ORDER — HEPARIN SOD (PORK) LOCK FLUSH 100 UNIT/ML IV SOLN
INTRAVENOUS | Status: AC
  Filled 2015-04-17: qty 5

## 2015-04-17 MED ORDER — SODIUM CHLORIDE 0.9% FLUSH
10.0000 mL | INTRAVENOUS | Status: DC | PRN
  Administered 2015-04-17: 10 mL via INTRAVENOUS
  Filled 2015-04-17: qty 10

## 2015-04-17 NOTE — Progress Notes (Signed)
[place] @ [facility] Telephone:([phone]) [phone]  Fax:([phone]) [place]: Feb 17, 1951  MR#: [id number]  PZW#:[id number]  Patient Care Team: Jerrol Banana., MD as PCP - General (Family Medicine) Robert Bellow, MD as Consulting Physician (General Surgery) Nicholaus Bloom, MD as Consulting Physician (Plastic Surgery) Tammy Rondell Reams, [place] (Family Medicine)  CHIEF COMPLAINT:  Chief Complaint  Patient presents with  . Breast Cancer   Oncology History   1.  Abnormal mammogram in April of 2016 followed by ultrasound and based on clinical examination 1 cm tumor.  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  (July 05, 2014) 2.  Status post lumpectomy and sentinel lymph node evaluation tumor size is 1.4 cm sentinel lymph nodes negative.  Stage IC T1 cN0 M0 tumor 3.  Patient is going for breast reduction surgery in June of 2016 4.  Adjuvant chemotherapy started with Taxol and Herceptin on September 04, 2014   5.  Patient received total 10 treatment of Taxol and has been discontinued course of progressing neuropathy Patient was referred to radiation therapy (November 15, 2014)     INTERVAL HISTORY: 65 year old lady here for continuation of chemotherapy with Taxol and Herceptin 1.4 cm tumor which is HER-2 positive.  Estrogen and progesterone receptor negative. Patient is here for ongoing evaluation for ongoing evaluation and treatment consideration. Patient is here for continuation of chemotherapy. No chills fever appetite has been stable No shortness of breath no swelling of lower extremity no chest pain Patient states she is having some joint pain. Right thumb joint is swollen and sore today.  Patient is feeling extremely weak and tired. Patient had a last MUGA scan was in November.  There is no shortness of breath.  Appetite has been fairly good patient appears to be very  depressed.     REVIEW OF SYSTEMS:    general status: Patient is feeling weak and tired.  No change in a performance status.  No chills.  No fever. HEENT:   No evidence of stomatitis Lungs: No cough or shortness of breath Cardiac: No chest pain or paroxysmal nocturnal dyspnea GI:as per interval history.  New-onset of nausea vomiting diarrhea Skin: No rash Lower extremity no swelling Neurological system: No tingling.  No numbness.  No other focal signs Musculoskeletal system no bony pains . As per HPI. Otherwise, a complete review of systems is negatve.  PAST MEDICAL HISTORY: Past Medical History  Diagnosis Date  . Hypertension   . Hyperlipidemia   . GERD (gastroesophageal reflux disease)   . Depression 1995  . Cancer ([place])     skin   . Carcinoma of left breast ([place]) 07/30/2014    1.4 cm, T1c, N0, ER negative, PR negative, HER-2 positive  . Complication of anesthesia 2004    oversensitive to noise, lights    PAST SURGICAL HISTORY: Past Surgical History  Procedure Laterality Date  . Back surgery    . Tonsillectomy    . Skin cancer  removal  2015    nose   . Colonoscopy  2013    Dr. Vira Agar  . Breast lumpectomy with axillary lymph node dissection Left 08/03/2014    Procedure: BREAST LUMPECTOMY WITH AXILLARY LYMPH NODE DISSECTION;  Surgeon: Robert Bellow, MD;  Location: [facility] ORS;  Service: General;  Laterality: Left;  . Sentinel node biopsy Left 08/03/2014    Procedure: SENTINEL NODE BIOPSY;  Surgeon: Forest Gleason  Bary Castilla, MD;  Location: [facility] ORS;  Service: General;  Laterality: Left;  . Axillary lymph node biopsy Left 08/03/2014    Procedure: AXILLARY LYMPH NODE BIOPSY;  Surgeon: Robert Bellow, MD;  Location: [facility] ORS;  Service: General;  Laterality: Left;  . Breast surgery Left Aug 03, 2014    Wide excision, sentinel node biopsy.  . Portacath placement Right 08/31/2014    Procedure: INSERTION PORT-A-CATH;  Surgeon: Robert Bellow, MD;  Location: [facility] ORS;  Service:  General;  Laterality: Right;    FAMILY HISTORY Family History  Problem Relation Age of Onset  . Breast cancer Mother 88       . Breast cancer Cousin 62         ADVANCED DIRECTIVES:  Patient does not have any living will or healthcare power of attorney.  Information was given .  Available resources had been discussed.  We will follow-up on subsequent appointments regarding this issue  HEALTH MAINTENANCE: Social History  Substance Use Topics  . Smoking status: Former Smoker -- 1.00 packs/day for 2 years    Types: Cigarettes    Quit date: 07/30/1976  . Smokeless tobacco: Not on file  . Alcohol Use: 0.0 - 1.2 oz/week    0-1 Glasses of wine, 0-1 Shots of liquor, 0 Standard drinks or equivalent per week      Allergies  Allergen Reactions  . Sulfa Antibiotics Hives and Rash    Current Outpatient Prescriptions  Medication Sig Dispense Refill  . chlorpheniramine-HYDROcodone (TUSSIONEX) 10-8 MG/5ML SUER Take 5 mLs by mouth every 12 (twelve) hours as needed for cough. 140 mL 0  . gabapentin (NEURONTIN) 100 MG capsule Take 1 capsule (100 mg total) by mouth 3 (three) times daily. 90 capsule 3  . HYDROcodone-acetaminophen (NORCO) 5-325 MG per tablet Take 1-2 tablets by mouth every 4 (four) hours as needed for moderate pain or severe pain. 30 tablet 0  . KLOR-CON M20 20 MEQ tablet Take 2 tablets (40 mEq total) by mouth 2 (two) times daily. 90 tablet 3  . lidocaine-prilocaine (EMLA) cream Apply 1 application topically as needed. 30 g 3  . omeprazole (PRILOSEC) 40 MG capsule Take 40 mg by mouth every morning.   4  . ondansetron (ZOFRAN) 8 MG tablet Take 1 tablet (8 mg total) by mouth 2 (two) times daily. Start the day after chemo for 2 days. Then take as needed for nausea or vomiting. 30 tablet 1  . pravastatin (PRAVACHOL) 40 MG tablet Take 40 mg by mouth every morning.   3  . sertraline (ZOLOFT) 100 MG tablet Take 100 mg by mouth every morning.   4  . silver sulfADIAZINE (SILVADENE) 1 %  cream Apply 1 application topically 2 (two) times daily. 50 g 2  . triamterene-hydrochlorothiazide (MAXZIDE) 75-50 MG per tablet Take 1 tablet by mouth every morning.   4   No current facility-administered medications for this visit.   Facility-Administered Medications Ordered in Other Visits  Medication Dose Route Frequency Provider Last Rate Last Dose  . acetaminophen (TYLENOL) tablet 650 mg  650 mg Oral Once Forest Gleason, MD      . diphenhydrAMINE (BENADRYL) capsule 25 mg  25 mg Oral Once Forest Gleason, MD   25 mg at 03/27/15 1605  . diphenhydrAMINE (BENADRYL) capsule 50 mg  50 mg Oral Once Forest Gleason, MD      . heparin lock flush 100 unit/mL  500 Units Intracatheter Once PRN Forest Gleason, MD      .  sodium chloride 0.9 % injection 10 mL  10 mL Intravenous PRN Forest Gleason, MD   10 mL at 09/25/14 1418  . sodium chloride 0.9 % injection 10 mL  10 mL Intravenous PRN Forest Gleason, MD   10 mL at 01/15/15 1325  . sodium chloride 0.9 % injection 10 mL  10 mL Intracatheter PRN Forest Gleason, MD   10 mL at 03/27/15 1415  . sodium chloride flush (NS) 0.9 % injection 10 mL  10 mL Intravenous PRN Forest Gleason, MD   10 mL at 04/17/15 1310    OBJECTIVE:  Filed Vitals:   04/17/15 1338 04/17/15 1339  BP: 160/81   Pulse: 85   Temp: 96.8 F (36 C)   Resp:  18     Body mass index is 37.53 kg/(m^2).    ECOG FS:0 - Asymptomatic  PHYSICAL EXAM: General  status: Performance status is good.  Patient has not lost significant weight HEENT: No evidence of stomatitis. Sclera and conjunctivae :: No jaundice.   pale looking. Lungs: Air  entry equal on both sides.  No rhonchi.  No rales.  Cardiac: Heart sounds are normal.  No pericardial rub.  No murmur. Lymphatic system: Cervical, axillary, inguinal, lymph nodes not palpable GI: Abdomen is soft.  No ascites.  Liver spleen not palpable.  No tenderness.  Bowel sounds are within normal limit Lower extremity: No edema Neurological system: Higher functions,  cranial nerves intact . 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  No ecchymosis.. Bilateral breast mammoplasty  Tolerating radiation therapy without any redness or skin changes Port site within normal limit healing well. Alopecia. No stomatitis   LAB RESULTS:  Appointment on 04/17/2015  Component Date Value Ref Range Status  . WBC 04/17/2015 7.5  3.6 - 11.0 K/uL Final  . RBC 04/17/2015 5.10  3.80 - 5.20 MIL/uL Final  . Hemoglobin 04/17/2015 14.2  12.0 - 16.0 g/dL Final  . HCT 04/17/2015 41.9  35.0 - 47.0 % Final  . MCV 04/17/2015 82.1  80.0 - 100.0 fL Final  . MCH 04/17/2015 27.9  26.0 - 34.0 pg Final  . MCHC 04/17/2015 33.9  32.0 - 36.0 g/dL Final  . RDW 04/17/2015 14.5  11.5 - 14.5 % Final  . Platelets 04/17/2015 161  150 - 440 K/uL Final  . Sodium 04/17/2015 137  135 - 145 mmol/L Final  . Potassium 04/17/2015 3.2* 3.5 - 5.1 mmol/L Final  . Chloride 04/17/2015 103  101 - 111 mmol/L Final  . CO2 04/17/2015 28  22 - 32 mmol/L Final  . Glucose, Bld 04/17/2015 90  65 - 99 mg/dL Final  . BUN 04/17/2015 15  6 - 20 mg/dL Final  . Creatinine, Ser 04/17/2015 1.03* 0.44 - 1.00 mg/dL Final  . Calcium 04/17/2015 9.0  8.9 - 10.3 mg/dL Final  . Total Protein 04/17/2015 7.0  6.5 - 8.1 g/dL Final  . Albumin 04/17/2015 4.0  3.5 - 5.0 g/dL Final  . AST 04/17/2015 26  15 - 41 U/L Final  . ALT 04/17/2015 23  14 - 54 U/L Final  . Alkaline Phosphatase 04/17/2015 56  38 - 126 U/L Final  . Total Bilirubin 04/17/2015 0.8  0.3 - 1.2 mg/dL Final  . GFR calc non Af Amer 04/17/2015 56* >60 mL/min Final  . GFR calc Af Amer 04/17/2015 >60  >60 mL/min Final   Comment: (NOTE) The eGFR has been calculated using the CKD EPI equation. This calculation has not been  validated in all clinical situations. eGFR's persistently <60 mL/min signify possible Chronic Kidney Disease.   . Anion gap 04/17/2015 6  5 - 15 Final  . Magnesium 04/17/2015 1.8  1.7 - 2.4 mg/dL Final        ASSESSMENT: Carcinoma of breast status post lumpectomy and sentinel lymph node (left breast) stage IC Estrogen and progesterone receptor negative HER-2/neu positive tumor Status post mammoplasty on both breast A considering patient's weakness which cannot be explained on any lab data or physical examination we would like to hold off any further chemotherapy And reevaluate patient in 1 month.  Patient has received total Months of Herceptin therapy   MEDICAL DECISION MAKING:   Discussed with the patient that exact etiology of generalized weakness is not clear. Possibility of holding chemotherapy and reevaluate his situation in 4 weeks Duration of visit is 25 minutes and 50% of time was spent discussing with the patient regarding etiology of weakness  Patient expressed understanding and was in agreement with this plan. She also understands that She can call clinic at any time with any questions, concerns, or complaints.    Carcinoma of left breast   Staging form: Breast, AJCC 7th Edition     Clinical: Stage IA (T1c, N0, M0) - Unsigned   Forest Gleason, MD   04/17/2015 4:15 PM

## 2015-04-17 NOTE — Progress Notes (Signed)
Patient states she thinks she picked up a bug over the weekend.  States she has had diarrhea for past couple of days and threw up this morning.  She states she is weak - no energy at all.

## 2015-05-08 ENCOUNTER — Encounter: Payer: Self-pay | Admitting: *Deleted

## 2015-05-08 DIAGNOSIS — C50912 Malignant neoplasm of unspecified site of left female breast: Secondary | ICD-10-CM

## 2015-05-08 NOTE — Progress Notes (Signed)
  Oncology Nurse Navigator Documentation  Navigator Location: [facility] (05/08/15 1100) Navigator Encounter Type: Telephone (05/08/15 1100)           Patient Visit Type: Follow-up (05/08/15 1100)   Barriers/Navigation Needs: Coordination of Care (05/08/15 1100)   Interventions: Coordination of Care (05/08/15 1100)   Coordination of Care: Appts (05/08/15 1100)                  Time Spent with Patient: 30 (05/08/15 1100)   Patient called with complaints of: extreme fatigue, soft loose stools times 5 per day, numbness and tingling all over, states her legs feel rubbery, and just has no energy.  Discussed case with Dr. Oliva Bustard.  He would like to see her Friday with a CBC, MetC, and Magnesium.  Patient informed of her appointment for Friday 05/11/15 @ 3:15.

## 2015-05-11 ENCOUNTER — Encounter: Payer: Self-pay | Admitting: Oncology

## 2015-05-11 ENCOUNTER — Inpatient Hospital Stay: Payer: BLUE CROSS/BLUE SHIELD | Attending: Oncology | Admitting: Oncology

## 2015-05-11 ENCOUNTER — Inpatient Hospital Stay: Payer: BLUE CROSS/BLUE SHIELD

## 2015-05-11 VITALS — BP 148/70 | HR 72 | Temp 97.1°F | Resp 18 | Wt 227.3 lb

## 2015-05-11 DIAGNOSIS — M2550 Pain in unspecified joint: Secondary | ICD-10-CM | POA: Insufficient documentation

## 2015-05-11 DIAGNOSIS — K219 Gastro-esophageal reflux disease without esophagitis: Secondary | ICD-10-CM | POA: Insufficient documentation

## 2015-05-11 DIAGNOSIS — Z87891 Personal history of nicotine dependence: Secondary | ICD-10-CM

## 2015-05-11 DIAGNOSIS — I10 Essential (primary) hypertension: Secondary | ICD-10-CM | POA: Insufficient documentation

## 2015-05-11 DIAGNOSIS — R531 Weakness: Secondary | ICD-10-CM | POA: Insufficient documentation

## 2015-05-11 DIAGNOSIS — C50912 Malignant neoplasm of unspecified site of left female breast: Secondary | ICD-10-CM

## 2015-05-11 DIAGNOSIS — Z803 Family history of malignant neoplasm of breast: Secondary | ICD-10-CM | POA: Diagnosis not present

## 2015-05-11 DIAGNOSIS — F32A Depression, unspecified: Secondary | ICD-10-CM

## 2015-05-11 DIAGNOSIS — Z9221 Personal history of antineoplastic chemotherapy: Secondary | ICD-10-CM | POA: Insufficient documentation

## 2015-05-11 DIAGNOSIS — E785 Hyperlipidemia, unspecified: Secondary | ICD-10-CM | POA: Diagnosis not present

## 2015-05-11 DIAGNOSIS — Z171 Estrogen receptor negative status [ER-]: Secondary | ICD-10-CM | POA: Insufficient documentation

## 2015-05-11 DIAGNOSIS — R5383 Other fatigue: Secondary | ICD-10-CM

## 2015-05-11 DIAGNOSIS — Z85828 Personal history of other malignant neoplasm of skin: Secondary | ICD-10-CM | POA: Diagnosis not present

## 2015-05-11 DIAGNOSIS — F329 Major depressive disorder, single episode, unspecified: Secondary | ICD-10-CM | POA: Diagnosis not present

## 2015-05-11 DIAGNOSIS — Z79899 Other long term (current) drug therapy: Secondary | ICD-10-CM | POA: Diagnosis not present

## 2015-05-11 LAB — COMPREHENSIVE METABOLIC PANEL
ALBUMIN: 4 g/dL (ref 3.5–5.0)
ALK PHOS: 52 U/L (ref 38–126)
ALT: 24 U/L (ref 14–54)
AST: 26 U/L (ref 15–41)
Anion gap: 7 (ref 5–15)
BILIRUBIN TOTAL: 0.9 mg/dL (ref 0.3–1.2)
BUN: 17 mg/dL (ref 6–20)
CO2: 28 mmol/L (ref 22–32)
CREATININE: 1.07 mg/dL — AB (ref 0.44–1.00)
Calcium: 9 mg/dL (ref 8.9–10.3)
Chloride: 103 mmol/L (ref 101–111)
GFR calc Af Amer: >60 mL/min (ref >60)
GFR, EST NON AFRICAN AMERICAN: 54 mL/min — AB (ref >60)
GLUCOSE: 91 mg/dL (ref 65–99)
Potassium: 3.4 mmol/L — ABNORMAL LOW (ref 3.5–5.1)
Sodium: 138 mmol/L (ref 135–145)
TOTAL PROTEIN: 7.3 g/dL (ref 6.5–8.1)

## 2015-05-11 LAB — CBC WITH DIFFERENTIAL/PLATELET
BASOS ABS: 0.1 10*3/uL (ref 0–0.1)
Basophils Relative: 2 %
EOS PCT: 1 %
Eosinophils Absolute: 0.1 10*3/uL (ref 0–0.7)
HEMATOCRIT: 43.1 % (ref 35.0–47.0)
Hemoglobin: 14.7 g/dL (ref 12.0–16.0)
LYMPHS PCT: 38 %
Lymphs Abs: 2.8 10*3/uL (ref 1.0–3.6)
MCH: 28.1 pg (ref 26.0–34.0)
MCHC: 34.2 g/dL (ref 32.0–36.0)
MCV: 82.2 fL (ref 80.0–100.0)
MONO ABS: 0.7 10*3/uL (ref 0.2–0.9)
Monocytes Relative: 9 %
Neutro Abs: 3.6 10*3/uL (ref 1.4–6.5)
Neutrophils Relative %: 50 %
PLATELETS: 172 10*3/uL (ref 150–440)
RBC: 5.25 MIL/uL — ABNORMAL HIGH (ref 3.80–5.20)
RDW: 14.8 % — AB (ref 11.5–14.5)
WBC: 7.3 10*3/uL (ref 3.6–11.0)

## 2015-05-11 LAB — MAGNESIUM: Magnesium: 1.8 mg/dL (ref 1.7–2.4)

## 2015-05-11 MED ORDER — CITALOPRAM HYDROBROMIDE 20 MG PO TABS: 20.0000 mg | ORAL_TABLET | Freq: Every day | ORAL | Status: DC

## 2015-05-11 NOTE — Progress Notes (Signed)
[place] @ [facility] [phone]  Fax:([phone]) [place] OB: December 11, 1950  MR#: [id number]  GYI#:[id number]  Patient Care Team: Jerrol Banana., MD as PCP - General (Family Medicine) Robert Bellow, MD as Consulting Physician (General Surgery) Nicholaus Bloom, MD as Consulting Physician (Plastic Surgery) Tammy Rondell Reams, [place] (Family Medicine)  CHIEF COMPLAINT:  Chief Complaint  Patient presents with  . Breast Cancer   Oncology History   1.  Abnormal mammogram in April of 2016 followed by ultrasound and based on clinical examination 1 cm tumor.  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  (July 05, 2014) 2.  Status post lumpectomy and sentinel lymph node evaluation tumor size is 1.4 cm sentinel lymph nodes negative.  Stage IC T1 cN0 M0 tumor 3.  Patient is going for breast reduction surgery in June of 2016 4.  Adjuvant chemotherapy started with Taxol and Herceptin on September 04, 2014   5.  Patient received total 10 treatment of Taxol and has been discontinued course of progressing neuropathy Patient was referred to radiation therapy (November 15, 2014) 6.  Herceptin has been discontinued because of progressive complaints of generalized aches and pains i.  Last treatment of Herceptin was given on March 27, 2015 2016    INTERVAL HISTORY: 65 year old lady here for continuation of chemotherapy with Taxol and Herceptin 1.4 cm tumor which is HER-2 positive.  Estrogen and progesterone receptor negative. Patient is here for ongoing evaluation for ongoing evaluation and treatment consideration. Patient is here for continuation of chemotherapy. No chills fever appetite has been stable No shortness of breath no swelling of lower extremity no chest pain Patient states she is having some joint pain. Right thumb joint is swollen and sore today.  Patient is feeling  extremely weak and tired. Patient had a last MUGA scan was in November.  There is no shortness of breath.  Appetite has been fairly good patient appears to be very depressed. Patient came today as an acute hand on complaining of feeling very depressed.  We tired.  Generalized aches and pains.  Patient has been taken off chemotherapy for last several weeks and also Herceptin has been discontinued.     REVIEW OF SYSTEMS:    general status: Patient is feeling weak and tired.  No change in a performance status.  No chills.  No fever. HEENT:   No evidence of stomatitis Lungs: No cough or shortness of breath Cardiac: No chest pain or paroxysmal nocturnal dyspnea GI:as per interval history.  New-onset of nausea vomiting diarrhea Skin: No rash Lower extremity no swelling Neurological system: No tingling.  No numbness.  No other focal signs Musculoskeletal system no bony pains . As per HPI. Otherwise, a complete review of systems is negatve.  PAST MEDICAL HISTORY: Past Medical History  Diagnosis Date  . Hypertension   . Hyperlipidemia   . GERD (gastroesophageal reflux disease)   . Depression 1995  . Cancer ([place])     skin   . Carcinoma of left breast ([place]) 07/30/2014    1.4 cm, T1c, N0, ER negative, PR negative, HER-2 positive  . Complication of anesthesia 2004    oversensitive to noise, lights    PAST SURGICAL HISTORY: Past Surgical History  Procedure Laterality Date  . Back surgery    . Tonsillectomy    . Skin cancer  removal  2015    nose   .  Colonoscopy  2013    Dr. Vira Agar  . Breast lumpectomy with axillary lymph node dissection Left 08/03/2014    Procedure: BREAST LUMPECTOMY WITH AXILLARY LYMPH NODE DISSECTION;  Surgeon: Robert Bellow, MD;  Location: [facility] ORS;  Service: General;  Laterality: Left;  . Sentinel node biopsy Left 08/03/2014    Procedure: SENTINEL NODE BIOPSY;  Surgeon: Robert Bellow, MD;  Location: [facility] ORS;  Service: General;  Laterality: Left;  .  Axillary lymph node biopsy Left 08/03/2014    Procedure: AXILLARY LYMPH NODE BIOPSY;  Surgeon: Robert Bellow, MD;  Location: [facility] ORS;  Service: General;  Laterality: Left;  . Breast surgery Left Aug 03, 2014    Wide excision, sentinel node biopsy.  . Portacath placement Right 08/31/2014    Procedure: INSERTION PORT-A-CATH;  Surgeon: Robert Bellow, MD;  Location: [facility] ORS;  Service: General;  Laterality: Right;    FAMILY HISTORY Family History  Problem Relation Age of Onset  . Breast cancer Mother 7       . Breast cancer Cousin 62         ADVANCED DIRECTIVES:  Patient does not have any living will or healthcare power of attorney.  Information was given .  Available resources had been discussed.  We will follow-up on subsequent appointments regarding this issue  HEALTH MAINTENANCE: Social History  Substance Use Topics  . Smoking status: Former Smoker -- 1.00 packs/day for 2 years    Types: Cigarettes    Quit date: 07/30/1976  . Smokeless tobacco: None  . Alcohol Use: 0.0 - 1.2 oz/week    0-1 Glasses of wine, 0-1 Shots of liquor, 0 Standard drinks or equivalent per week      Allergies  Allergen Reactions  . Sulfa Antibiotics Hives and Rash    Current Outpatient Prescriptions  Medication Sig Dispense Refill  . chlorpheniramine-HYDROcodone (TUSSIONEX) 10-8 MG/5ML SUER Take 5 mLs by mouth every 12 (twelve) hours as needed for cough. 140 mL 0  . gabapentin (NEURONTIN) 100 MG capsule Take 1 capsule (100 mg total) by mouth 3 (three) times daily. 90 capsule 3  . HYDROcodone-acetaminophen (NORCO) 5-325 MG per tablet Take 1-2 tablets by mouth every 4 (four) hours as needed for moderate pain or severe pain. 30 tablet 0  . KLOR-CON M20 20 MEQ tablet Take 2 tablets (40 mEq total) by mouth 2 (two) times daily. 90 tablet 3  . lidocaine-prilocaine (EMLA) cream Apply 1 application topically as needed. 30 g 3  . omeprazole (PRILOSEC) 40 MG capsule Take 40 mg by mouth every morning.    4  . ondansetron (ZOFRAN) 8 MG tablet Take 1 tablet (8 mg total) by mouth 2 (two) times daily. Start the day after chemo for 2 days. Then take as needed for nausea or vomiting. 30 tablet 1  . pravastatin (PRAVACHOL) 40 MG tablet Take 40 mg by mouth every morning.   3  . sertraline (ZOLOFT) 100 MG tablet Take 100 mg by mouth every morning.   4  . silver sulfADIAZINE (SILVADENE) 1 % cream Apply 1 application topically 2 (two) times daily. 50 g 2  . triamterene-hydrochlorothiazide (MAXZIDE) 75-50 MG per tablet Take 1 tablet by mouth every morning.   4   No current facility-administered medications for this visit.   Facility-Administered Medications Ordered in Other Visits  Medication Dose Route Frequency Provider Last Rate Last Dose  . acetaminophen (TYLENOL) tablet 650 mg  650 mg Oral Once Forest Gleason, MD      .  diphenhydrAMINE (BENADRYL) capsule 25 mg  25 mg Oral Once Forest Gleason, MD   25 mg at 03/27/15 1605  . diphenhydrAMINE (BENADRYL) capsule 50 mg  50 mg Oral Once Forest Gleason, MD      . heparin lock flush 100 unit/mL  500 Units Intracatheter Once PRN Forest Gleason, MD      . sodium chloride 0.9 % injection 10 mL  10 mL Intravenous PRN Forest Gleason, MD   10 mL at 09/25/14 1418  . sodium chloride 0.9 % injection 10 mL  10 mL Intravenous PRN Forest Gleason, MD   10 mL at 01/15/15 1325  . sodium chloride 0.9 % injection 10 mL  10 mL Intracatheter PRN Forest Gleason, MD   10 mL at 03/27/15 1415    OBJECTIVE:  Filed Vitals:   05/11/15 1537  BP: 148/70  Pulse: 72  Temp: 97.1 F (36.2 C)  Resp: 18     Body mass index is 37.82 kg/(m^2).    ECOG FS:0 - Asymptomatic  PHYSICAL EXAM: General  status: Performance status is good.  Patient has not lost significant weight HEENT: No evidence of stomatitis. Sclera and conjunctivae :: No jaundice.   pale looking. Lungs: Air  entry equal on both sides.  No rhonchi.  No rales.  Cardiac: Heart sounds are normal.  No pericardial rub.  No  murmur. Lymphatic system: Cervical, axillary, inguinal, lymph nodes not palpable GI: Abdomen is soft.  No ascites.  Liver spleen not palpable.  No tenderness.  Bowel sounds are within normal limit Lower extremity: No edema Neurological system: Higher functions, cranial nerves intact . 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  No ecchymosis.. Bilateral breast mammoplasty  Tolerating radiation therapy without any redness or skin changes Port site within normal limit healing well. Alopecia. No stomatitis   LAB RESULTS:  Appointment on 05/11/2015  Component Date Value Ref Range Status  . WBC 05/11/2015 7.3  3.6 - 11.0 K/uL Final  . RBC 05/11/2015 5.25* 3.80 - 5.20 MIL/uL Final  . Hemoglobin 05/11/2015 14.7  12.0 - 16.0 g/dL Final  . HCT 05/11/2015 43.1  35.0 - 47.0 % Final  . MCV 05/11/2015 82.2  80.0 - 100.0 fL Final  . MCH 05/11/2015 28.1  26.0 - 34.0 pg Final  . MCHC 05/11/2015 34.2  32.0 - 36.0 g/dL Final  . RDW 05/11/2015 14.8* 11.5 - 14.5 % Final  . Platelets 05/11/2015 172  150 - 440 K/uL Final  . Neutrophils Relative % 05/11/2015 50   Final  . Neutro Abs 05/11/2015 3.6  1.4 - 6.5 K/uL Final  . Lymphocytes Relative 05/11/2015 38   Final  . Lymphs Abs 05/11/2015 2.8  1.0 - 3.6 K/uL Final  . Monocytes Relative 05/11/2015 9   Final  . Monocytes Absolute 05/11/2015 0.7  0.2 - 0.9 K/uL Final  . Eosinophils Relative 05/11/2015 1   Final  . Eosinophils Absolute 05/11/2015 0.1  0 - 0.7 K/uL Final  . Basophils Relative 05/11/2015 2   Final  . Basophils Absolute 05/11/2015 0.1  0 - 0.1 K/uL Final       ASSESSMENT: Carcinoma of breast status post lumpectomy and sentinel lymph node (left breast) stage IC Estrogen and progesterone receptor negative HER-2/neu positive tumor Status post mammoplasty on both breast Assistant continues to complain of weakness.  Generalized aches.  Etiology is not clear.  All lab data has been reviewed no abnormality  detected This point in time we will discontinue  Zoloft start patient on Celexa 20 mg daily if needed psychiatric evaluation would be planned.  Reevaluate patient in 4 weeks no further treatment with Herceptin has been planned     Carcinoma of left breast   Staging form: Breast, AJCC 7th Edition     Clinical: Stage IA (T1c, N0, M0) - Madison Griffon, MD   05/11/2015 3:45 PM

## 2015-05-11 NOTE — Progress Notes (Signed)
Patient states she continues to have bouts of diarrhea.  On Wednesday she had BM's 7-8 times.  Further states that she wants to discuss with MD regarding changing her antidepressant. States she is fatigued most of the time.

## 2015-05-12 ENCOUNTER — Encounter: Payer: Self-pay | Admitting: Oncology

## 2015-05-12 ENCOUNTER — Other Ambulatory Visit: Payer: Self-pay | Admitting: Oncology

## 2015-05-15 ENCOUNTER — Other Ambulatory Visit: Payer: BLUE CROSS/BLUE SHIELD

## 2015-05-15 ENCOUNTER — Ambulatory Visit: Payer: BLUE CROSS/BLUE SHIELD | Admitting: Oncology

## 2015-05-18 ENCOUNTER — Other Ambulatory Visit
Admission: RE | Admit: 2015-05-18 | Discharge: 2015-05-18 | Disposition: A | Discharge status: Home / Self Care | Payer: BLUE CROSS/BLUE SHIELD | Source: Ambulatory Visit | Attending: Physician Assistant | Admitting: Physician Assistant

## 2015-05-18 DIAGNOSIS — R197 Diarrhea, unspecified: Secondary | ICD-10-CM | POA: Insufficient documentation

## 2015-05-18 LAB — GASTROINTESTINAL PANEL BY PCR, STOOL (REPLACES STOOL CULTURE)
ASTROVIRUS: NOT DETECTED
Adenovirus F40/41: NOT DETECTED
Campylobacter species: NOT DETECTED
Cryptosporidium: NOT DETECTED
Cyclospora cayetanensis: NOT DETECTED
E. COLI O157: NOT DETECTED
ENTAMOEBA HISTOLYTICA: NOT DETECTED
ENTEROTOXIGENIC E COLI (ETEC): NOT DETECTED
Enteroaggregative E coli (EAEC): NOT DETECTED
Enteropathogenic E coli (EPEC): NOT DETECTED
Giardia lamblia: NOT DETECTED
NOROVIRUS GI/GII: NOT DETECTED
Plesimonas shigelloides: NOT DETECTED
ROTAVIRUS A: NOT DETECTED
SALMONELLA SPECIES: NOT DETECTED
SAPOVIRUS (I, II, IV, AND V): NOT DETECTED
SHIGA LIKE TOXIN PRODUCING E COLI (STEC): NOT DETECTED
SHIGELLA/ENTEROINVASIVE E COLI (EIEC): NOT DETECTED
VIBRIO CHOLERAE: NOT DETECTED
Vibrio species: NOT DETECTED
Yersinia enterocolitica: NOT DETECTED

## 2015-05-18 LAB — C DIFFICILE QUICK SCREEN W PCR REFLEX
C DIFFICILE (CDIFF) INTERP: NEGATIVE
C DIFFICILE (CDIFF) TOXIN: NEGATIVE
C DIFFICLE (CDIFF) ANTIGEN: NEGATIVE

## 2015-05-23 LAB — LACTOFERRIN, FECAL, QUANT.: LACTOFERRIN, FECAL, QUANT.: 2.16 ug/mL (ref 0.00–7.24)

## 2015-05-23 LAB — PANCREATIC ELASTASE, FECAL

## 2015-05-29 ENCOUNTER — Encounter: Payer: Self-pay | Admitting: *Deleted

## 2015-05-30 ENCOUNTER — Ambulatory Visit: Payer: BLUE CROSS/BLUE SHIELD | Admitting: Anesthesiology

## 2015-05-30 ENCOUNTER — Ambulatory Visit
Admission: RE | Admit: 2015-05-30 | Discharge: 2015-05-30 | Disposition: A | Discharge status: Home / Self Care | Payer: BLUE CROSS/BLUE SHIELD | Source: Ambulatory Visit | Attending: Unknown Physician Specialty | Admitting: Unknown Physician Specialty

## 2015-05-30 ENCOUNTER — Encounter
Admission: RE | Disposition: A | Discharge status: Home / Self Care | Payer: Self-pay | Source: Ambulatory Visit | Attending: Unknown Physician Specialty

## 2015-05-30 DIAGNOSIS — Z853 Personal history of malignant neoplasm of breast: Secondary | ICD-10-CM | POA: Diagnosis not present

## 2015-05-30 DIAGNOSIS — E785 Hyperlipidemia, unspecified: Secondary | ICD-10-CM | POA: Diagnosis not present

## 2015-05-30 DIAGNOSIS — R194 Change in bowel habit: Secondary | ICD-10-CM | POA: Diagnosis present

## 2015-05-30 DIAGNOSIS — Z833 Family history of diabetes mellitus: Secondary | ICD-10-CM | POA: Diagnosis not present

## 2015-05-30 DIAGNOSIS — Z87891 Personal history of nicotine dependence: Secondary | ICD-10-CM | POA: Diagnosis not present

## 2015-05-30 DIAGNOSIS — E876 Hypokalemia: Secondary | ICD-10-CM | POA: Insufficient documentation

## 2015-05-30 DIAGNOSIS — Z85828 Personal history of other malignant neoplasm of skin: Secondary | ICD-10-CM | POA: Diagnosis not present

## 2015-05-30 DIAGNOSIS — Z823 Family history of stroke: Secondary | ICD-10-CM | POA: Diagnosis not present

## 2015-05-30 DIAGNOSIS — Z832 Family history of diseases of the blood and blood-forming organs and certain disorders involving the immune mechanism: Secondary | ICD-10-CM | POA: Diagnosis not present

## 2015-05-30 DIAGNOSIS — K529 Noninfective gastroenteritis and colitis, unspecified: Secondary | ICD-10-CM | POA: Diagnosis not present

## 2015-05-30 DIAGNOSIS — Z171 Estrogen receptor negative status [ER-]: Secondary | ICD-10-CM | POA: Insufficient documentation

## 2015-05-30 DIAGNOSIS — K640 First degree hemorrhoids: Secondary | ICD-10-CM | POA: Insufficient documentation

## 2015-05-30 DIAGNOSIS — I10 Essential (primary) hypertension: Secondary | ICD-10-CM | POA: Diagnosis not present

## 2015-05-30 DIAGNOSIS — Z8249 Family history of ischemic heart disease and other diseases of the circulatory system: Secondary | ICD-10-CM | POA: Insufficient documentation

## 2015-05-30 DIAGNOSIS — F329 Major depressive disorder, single episode, unspecified: Secondary | ICD-10-CM | POA: Diagnosis not present

## 2015-05-30 DIAGNOSIS — K219 Gastro-esophageal reflux disease without esophagitis: Secondary | ICD-10-CM | POA: Diagnosis not present

## 2015-05-30 DIAGNOSIS — Z803 Family history of malignant neoplasm of breast: Secondary | ICD-10-CM | POA: Diagnosis not present

## 2015-05-30 DIAGNOSIS — R197 Diarrhea, unspecified: Secondary | ICD-10-CM | POA: Insufficient documentation

## 2015-05-30 HISTORY — PX: COLONOSCOPY WITH PROPOFOL: SHX5780

## 2015-05-30 SURGERY — COLONOSCOPY WITH PROPOFOL
Anesthesia: General

## 2015-05-30 MED ORDER — MIDAZOLAM HCL 5 MG/5ML IJ SOLN
INTRAMUSCULAR | Status: DC | PRN
  Administered 2015-05-30: 1 mg via INTRAVENOUS

## 2015-05-30 MED ORDER — PROPOFOL 500 MG/50ML IV EMUL
INTRAVENOUS | Status: DC | PRN
  Administered 2015-05-30: 180 ug/kg/min via INTRAVENOUS

## 2015-05-30 MED ORDER — FENTANYL CITRATE (PF) 100 MCG/2ML IJ SOLN
INTRAMUSCULAR | Status: DC | PRN
  Administered 2015-05-30: 50 ug via INTRAVENOUS

## 2015-05-30 MED ORDER — SODIUM CHLORIDE 0.9 % IV SOLN: INTRAVENOUS | Status: DC

## 2015-05-30 MED ORDER — LIDOCAINE HCL (CARDIAC) 20 MG/ML IV SOLN
INTRAVENOUS | Status: DC | PRN
  Administered 2015-05-30: 30 mg via INTRAVENOUS

## 2015-05-30 MED ORDER — PROPOFOL 10 MG/ML IV BOLUS
INTRAVENOUS | Status: DC | PRN
  Administered 2015-05-30: 50 mg via INTRAVENOUS

## 2015-05-30 MED ORDER — SODIUM CHLORIDE 0.9 % IV SOLN
INTRAVENOUS | Status: DC
  Administered 2015-05-30: 1000 mL via INTRAVENOUS

## 2015-05-30 MED ORDER — SODIUM CHLORIDE 0.9 % IV SOLN: INTRAVENOUS | Status: AC

## 2015-05-30 NOTE — Transfer of Care (Signed)
Immediate Anesthesia Transfer of Care Note  Patient: Madison Christensen  Procedure(s) Performed: Procedure(s): COLONOSCOPY WITH PROPOFOL (N/A)  Patient Location: PACU and Short Stay  Anesthesia Type:General  Level of Consciousness: awake and patient cooperative  Airway & Oxygen Therapy: Patient Spontanous Breathing and Patient connected to nasal cannula oxygen  Post-op Assessment: Report given to RN and Post -op Vital signs reviewed and stable  Post vital signs: Reviewed and stable  Last Vitals:  Filed Vitals:   05/30/15 1013  BP: 141/63  Pulse: 80  Temp: 36.8 C  Resp: 16    Complications: No apparent anesthesia complications

## 2015-05-30 NOTE — Anesthesia Preprocedure Evaluation (Signed)
Anesthesia Evaluation  Patient identified by MRN, date of birth, ID band Patient awake    Reviewed: Allergy & Precautions, H&P , NPO status , Patient's Chart, lab work & pertinent test results, reviewed documented beta blocker date and time   History of Anesthesia Complications (+) history of anesthetic complications  Airway Mallampati: III  TM Distance: >3 FB Neck ROM: full    Dental no notable dental hx. (+) Caps, Missing, Teeth Intact   Pulmonary neg pulmonary ROS, former smoker,    Pulmonary exam normal breath sounds clear to auscultation       Cardiovascular Exercise Tolerance: Good hypertension, (-) angina(-) CAD, (-) Past MI, (-) Cardiac Stents and (-) CABG Normal cardiovascular exam(-) dysrhythmias (-) Valvular Problems/Murmurs Rhythm:regular Rate:Normal     Neuro/Psych PSYCHIATRIC DISORDERS (Depression) negative neurological ROS     GI/Hepatic negative GI ROS, Neg liver ROS, GERD  ,  Endo/Other  negative endocrine ROS  Renal/GU negative Renal ROS  negative genitourinary   Musculoskeletal   Abdominal   Peds  Hematology negative hematology ROS (+)   Anesthesia Other Findings Past Medical History:   Hypertension                                                 Hyperlipidemia                                               GERD (gastroesophageal reflux disease)                       Depression                                      1995         Cancer ([place])                                                   Comment:skin    Carcinoma of left breast ([place])                  07/30/2014      Comment:1.4 cm, T1c, N0, ER negative, PR negative,               HER-2 positive   Complication of anesthesia                      2004           Comment:oversensitive to noise, lights   Reproductive/Obstetrics negative OB ROS                             Anesthesia Physical Anesthesia Plan  ASA:  III  Anesthesia Plan: General   Post-op Pain Management:    Induction:   Airway Management Planned:   Additional Equipment:   Intra-op Plan:   Post-operative Plan:   Informed Consent: I have reviewed the patients History and Physical, chart, labs and discussed the procedure  including the risks, benefits and alternatives for the proposed anesthesia with the patient or authorized representative who has indicated his/her understanding and acceptance.   Dental Advisory Given  Plan Discussed with: Anesthesiologist, CRNA and Surgeon  Anesthesia Plan Comments:         Anesthesia Quick Evaluation

## 2015-05-30 NOTE — H&P (Signed)
Primary Care Physician:  Wilhemena Durie, MD Primary Gastroenterologist:  Dr. Vira Agar  Pre-Procedure History & Physical: HPI:  Madison Christensen is a 65 y.o. female is here for an colonoscopy.   Past Medical History  Diagnosis Date  . Hypertension   . Hyperlipidemia   . GERD (gastroesophageal reflux disease)   . Depression 1995  . Cancer ([place])     skin   . Carcinoma of left breast ([place]) 07/30/2014    1.4 cm, T1c, N0, ER negative, PR negative, HER-2 positive  . Complication of anesthesia 2004    oversensitive to noise, lights    Past Surgical History  Procedure Laterality Date  . Back surgery    . Tonsillectomy    . Skin cancer  removal  2015    nose   . Colonoscopy  2013    Dr. Vira Agar  . Breast lumpectomy with axillary lymph node dissection Left 08/03/2014    Procedure: BREAST LUMPECTOMY WITH AXILLARY LYMPH NODE DISSECTION;  Surgeon: Keayra Graham Bellow, MD;  Location: [facility] ORS;  Service: General;  Laterality: Left;  . Sentinel node biopsy Left 08/03/2014    Procedure: SENTINEL NODE BIOPSY;  Surgeon: Bridgett Hattabaugh Bellow, MD;  Location: [facility] ORS;  Service: General;  Laterality: Left;  . Axillary lymph node biopsy Left 08/03/2014    Procedure: AXILLARY LYMPH NODE BIOPSY;  Surgeon: Jillene Wehrenberg Bellow, MD;  Location: [facility] ORS;  Service: General;  Laterality: Left;  . Breast surgery Left Aug 03, 2014    Wide excision, sentinel node biopsy.  . Portacath placement Right 08/31/2014    Procedure: INSERTION PORT-A-CATH;  Surgeon: Mckenzie Toruno Bellow, MD;  Location: [facility] ORS;  Service: General;  Laterality: Right;    Prior to Admission medications   Medication Sig Start Date End Date Taking? Authorizing Provider  triamterene-hydrochlorothiazide (MAXZIDE) 75-50 MG per tablet Take 1 tablet by mouth every morning.  06/18/14  Yes Historical Provider, MD  chlorpheniramine-HYDROcodone (TUSSIONEX) 10-8 MG/5ML SUER Take 5 mLs by mouth every 12 (twelve) hours as needed for cough. 09/04/14   Forest Gleason, MD  citalopram (CELEXA) 20 MG tablet Take 1 tablet (20 mg total) by mouth daily. 05/11/15   Forest Gleason, MD  gabapentin (NEURONTIN) 100 MG capsule Take 1 capsule (100 mg total) by mouth 3 (three) times daily. 04/04/15   Forest Gleason, MD  HYDROcodone-acetaminophen (NORCO) 5-325 MG per tablet Take 1-2 tablets by mouth every 4 (four) hours as needed for moderate pain or severe pain. 08/03/14   Laurna Shetley Bellow, MD  KLOR-CON M20 20 MEQ tablet Take 2 tablets (40 mEq total) by mouth 2 (two) times daily. 12/19/14   Evlyn Kanner, NP  KLOR-CON M20 20 MEQ tablet TAKE 1 TABLET BY MOUTH 3 TIMES A DAY 05/14/15   Forest Gleason, MD  lidocaine-prilocaine (EMLA) cream Apply 1 application topically as needed. 02/05/15   Forest Gleason, MD  omeprazole (PRILOSEC) 40 MG capsule Take 40 mg by mouth every morning.  06/18/14   Historical Provider, MD  ondansetron (ZOFRAN) 8 MG tablet Take 1 tablet (8 mg total) by mouth 2 (two) times daily. Start the day after chemo for 2 days. Then take as needed for nausea or vomiting. 09/01/14   Forest Gleason, MD  pravastatin (PRAVACHOL) 40 MG tablet Take 40 mg by mouth every morning.  06/14/14   Historical Provider, MD  silver sulfADIAZINE (SILVADENE) 1 % cream Apply 1 application topically 2 (two) times daily. 01/11/15   Noreene Filbert, MD  Allergies as of 05/22/2015 - Review Complete 05/12/2015  Allergen Reaction Noted  . Sulfa antibiotics Hives and Rash 07/11/2014    Family History  Problem Relation Age of Onset  . Breast cancer Mother 75       . Breast cancer Cousin 62         Social History   Social History  . Marital Status: Widowed    Spouse Name: N/A  . Number of Children: N/A  . Years of Education: N/A   Occupational History  . Not on file.   Social History Main Topics  . Smoking status: Former Smoker -- 1.00 packs/day for 2 years    Types: Cigarettes    Quit date: 07/30/1976  . Smokeless tobacco: Not on file  . Alcohol Use: 0.0 - 1.2 oz/week    0-1  Glasses of wine, 0-1 Shots of liquor, 0 Standard drinks or equivalent per week  . Drug Use: No  . Sexual Activity: Not on file   Other Topics Concern  . Not on file   Social History Narrative    Review of Systems: See HPI, otherwise negative ROS  Physical Exam: BP 141/63 mmHg  Pulse 80  Temp(Src) 98.2 F (36.8 C) (Tympanic)  Resp 16  Ht _0  (1.651 m)  Wt 104.327 kg (230 lb)  BMI 38.27 kg/m2  SpO2 98% General:   Alert,  pleasant and cooperative in NAD Head:  Normocephalic and atraumatic. Neck:  Supple; no masses or thyromegaly. Lungs:  Clear throughout to auscultation.    Heart:  Regular rate and rhythm. Abdomen:  Soft, nontender and nondistended. Normal bowel sounds, without guarding, and without rebound.   Neurologic:  Alert and  oriented x4;  grossly normal neurologically.  Impression/Plan: Madison Christensen is here for an colonoscopy to be performed for chronic diarrhea  Risks, benefits, limitations, and alternatives regarding  colonoscopy have been reviewed with the patient.  Questions have been answered.  All parties agreeable.   Gaylyn Cheers, MD  05/30/2015, 11:11 AM

## 2015-05-31 LAB — SURGICAL PATHOLOGY

## 2015-05-31 NOTE — Anesthesia Postprocedure Evaluation (Signed)
Anesthesia Post Note  Patient: Madison Christensen  Procedure(s) Performed: Procedure(s) (LRB): COLONOSCOPY WITH PROPOFOL (N/A)  Patient location during evaluation: Endoscopy Anesthesia Type: General Level of consciousness: awake and alert Pain management: pain level controlled Vital Signs Assessment: post-procedure vital signs reviewed and stable Respiratory status: spontaneous breathing, nonlabored ventilation, respiratory function stable and patient connected to nasal cannula oxygen Cardiovascular status: blood pressure returned to baseline and stable Postop Assessment: no signs of nausea or vomiting Anesthetic complications: no    Last Vitals:  Filed Vitals:   05/30/15 1150 05/30/15 1200  BP: 122/63 122/63  Pulse: 80 75  Temp:    Resp: 16 13    Last Pain:  Filed Vitals:   05/31/15 0746  PainSc: 0-No pain                 Martha Clan

## 2015-05-31 NOTE — Op Note (Signed)
[facility] Gastroenterology Patient Name: Madison Christensen Procedure Date: 05/30/2015 11:13 AM MRN: [id number] Account #: [id number] Date of Birth: 1950-05-15 Admit Type: Outpatient Age: 65 Room: [facility] ENDO ROOM 3 Gender: Female Note Status: Finalized Procedure:            Colonoscopy Indications:          Chronic diarrhea, Clinically significant diarrhea of                        unexplained origin Providers:            Manya Silvas, MD Referring MD:         Janine Ores. Rosanna Randy, MD (Referring MD) Medicines:            Propofol per Anesthesia Complications:        No immediate complications. Procedure:            Pre-Anesthesia Assessment:                       - After reviewing the risks and benefits, the patient                        was deemed in satisfactory condition to undergo the                        procedure.                       After obtaining informed consent, the colonoscope was                        passed under direct vision. Throughout the procedure,                        the patient's blood pressure, pulse, and oxygen                        saturations were monitored continuously. The                        Colonoscope was introduced through the anus and                        advanced to the the cecum, identified by appendiceal                        orifice and ileocecal valve. The colonoscopy was                        performed without difficulty. The colonoscopy was                        performed without difficulty. The patient tolerated the                        procedure well. The quality of the bowel preparation                        was excellent. Findings:      Internal hemorrhoids were found during endoscopy. The hemorrhoids were       small and Grade I (internal hemorrhoids  that do not prolapse).      Normal mucosa was found in the entire colon. Biopsies were taken with a       cold forceps for histology. Due to  chronic diarrhea biopsies done of       ascending, transverse,descending and sigmoid colon. No areas of       inflammation were seen, no polyps seen. Impression:           - Internal hemorrhoids.                       - Normal mucosa in the entire examined colon. Biopsied. Recommendation:       - Await pathology results. Manya Silvas, MD 05/30/2015 11:35:32 AM This report has been signed electronically. Number of Addenda: 0 Note Initiated On: 05/30/2015 11:13 AM Scope Withdrawal Time: 0 hours 10 minutes 27 seconds  Total Procedure Duration: 0 hours 12 minutes 50 seconds       [facility]

## 2015-06-04 ENCOUNTER — Inpatient Hospital Stay: Payer: BLUE CROSS/BLUE SHIELD

## 2015-06-04 ENCOUNTER — Inpatient Hospital Stay (HOSPITAL_BASED_OUTPATIENT_CLINIC_OR_DEPARTMENT_OTHER): Payer: BLUE CROSS/BLUE SHIELD | Admitting: Family Medicine

## 2015-06-04 ENCOUNTER — Inpatient Hospital Stay: Payer: BLUE CROSS/BLUE SHIELD | Admitting: Oncology

## 2015-06-04 VITALS — BP 113/72 | HR 70 | Temp 98.3°F | Resp 18 | Wt 225.8 lb

## 2015-06-04 DIAGNOSIS — E876 Hypokalemia: Secondary | ICD-10-CM

## 2015-06-04 DIAGNOSIS — Z171 Estrogen receptor negative status [ER-]: Secondary | ICD-10-CM | POA: Diagnosis not present

## 2015-06-04 DIAGNOSIS — M2550 Pain in unspecified joint: Secondary | ICD-10-CM

## 2015-06-04 DIAGNOSIS — C801 Malignant (primary) neoplasm, unspecified: Secondary | ICD-10-CM

## 2015-06-04 DIAGNOSIS — F32A Depression, unspecified: Secondary | ICD-10-CM

## 2015-06-04 DIAGNOSIS — C50912 Malignant neoplasm of unspecified site of left female breast: Secondary | ICD-10-CM

## 2015-06-04 DIAGNOSIS — Z9221 Personal history of antineoplastic chemotherapy: Secondary | ICD-10-CM

## 2015-06-04 DIAGNOSIS — Z79899 Other long term (current) drug therapy: Secondary | ICD-10-CM

## 2015-06-04 DIAGNOSIS — F329 Major depressive disorder, single episode, unspecified: Secondary | ICD-10-CM | POA: Diagnosis not present

## 2015-06-04 DIAGNOSIS — K219 Gastro-esophageal reflux disease without esophagitis: Secondary | ICD-10-CM

## 2015-06-04 DIAGNOSIS — Z85828 Personal history of other malignant neoplasm of skin: Secondary | ICD-10-CM

## 2015-06-04 DIAGNOSIS — R531 Weakness: Secondary | ICD-10-CM

## 2015-06-04 DIAGNOSIS — I10 Essential (primary) hypertension: Secondary | ICD-10-CM

## 2015-06-04 DIAGNOSIS — Z803 Family history of malignant neoplasm of breast: Secondary | ICD-10-CM

## 2015-06-04 DIAGNOSIS — R5383 Other fatigue: Secondary | ICD-10-CM

## 2015-06-04 DIAGNOSIS — E785 Hyperlipidemia, unspecified: Secondary | ICD-10-CM

## 2015-06-04 DIAGNOSIS — Z87891 Personal history of nicotine dependence: Secondary | ICD-10-CM

## 2015-06-04 LAB — COMPREHENSIVE METABOLIC PANEL
ALT: 20 U/L (ref 14–54)
AST: 24 U/L (ref 15–41)
Albumin: 4.1 g/dL (ref 3.5–5.0)
Alkaline Phosphatase: 56 U/L (ref 38–126)
Anion gap: 7 (ref 5–15)
BILIRUBIN TOTAL: 1 mg/dL (ref 0.3–1.2)
BUN: 17 mg/dL (ref 6–20)
CO2: 28 mmol/L (ref 22–32)
Calcium: 8.8 mg/dL — ABNORMAL LOW (ref 8.9–10.3)
Chloride: 102 mmol/L (ref 101–111)
Creatinine, Ser: 1.01 mg/dL — ABNORMAL HIGH (ref 0.44–1.00)
GFR calc Af Amer: >60 mL/min (ref >60)
GFR, EST NON AFRICAN AMERICAN: 58 mL/min — AB (ref >60)
GLUCOSE: 99 mg/dL (ref 65–99)
Potassium: 3.1 mmol/L — ABNORMAL LOW (ref 3.5–5.1)
Sodium: 137 mmol/L (ref 135–145)
TOTAL PROTEIN: 7.8 g/dL (ref 6.5–8.1)

## 2015-06-04 LAB — CBC WITH DIFFERENTIAL/PLATELET
BASOS ABS: 0 10*3/uL (ref 0–0.1)
Basophils Relative: 0 %
Eosinophils Absolute: 0.1 10*3/uL (ref 0–0.7)
Eosinophils Relative: 1 %
HEMATOCRIT: 42.6 % (ref 35.0–47.0)
HEMOGLOBIN: 14.8 g/dL (ref 12.0–16.0)
LYMPHS PCT: 38 %
Lymphs Abs: 2.9 10*3/uL (ref 1.0–3.6)
MCH: 28.4 pg (ref 26.0–34.0)
MCHC: 34.6 g/dL (ref 32.0–36.0)
MCV: 81.9 fL (ref 80.0–100.0)
Monocytes Absolute: 0.6 10*3/uL (ref 0.2–0.9)
Monocytes Relative: 8 %
NEUTROS ABS: 4.1 10*3/uL (ref 1.4–6.5)
NEUTROS PCT: 53 %
Platelets: 182 10*3/uL (ref 150–440)
RBC: 5.2 MIL/uL (ref 3.80–5.20)
RDW: 14.4 % (ref 11.5–14.5)
WBC: 7.7 10*3/uL (ref 3.6–11.0)

## 2015-06-04 LAB — MAGNESIUM: MAGNESIUM: 1.8 mg/dL (ref 1.7–2.4)

## 2015-06-04 MED ORDER — SODIUM CHLORIDE 0.9 % IV SOLN
INTRAVENOUS | Status: DC
  Administered 2015-06-04: 15:00:00 via INTRAVENOUS
  Filled 2015-06-04: qty 1000

## 2015-06-04 MED ORDER — HEPARIN SOD (PORK) LOCK FLUSH 100 UNIT/ML IV SOLN
500.0000 [IU] | Freq: Once | INTRAVENOUS | Status: AC
  Administered 2015-06-04: 500 [IU] via INTRAVENOUS
  Filled 2015-06-04: qty 5

## 2015-06-04 MED ORDER — POTASSIUM CHLORIDE 20 MEQ/100ML IV SOLN: 20.0000 meq | Freq: Once | INTRAVENOUS | Status: DC

## 2015-06-04 MED ORDER — POTASSIUM CHLORIDE 2 MEQ/ML IV SOLN
Freq: Once | INTRAVENOUS | Status: AC
  Administered 2015-06-04: 15:00:00 via INTRAVENOUS
  Filled 2015-06-04: qty 100

## 2015-06-04 MED ORDER — SODIUM CHLORIDE 0.9% FLUSH
10.0000 mL | INTRAVENOUS | Status: DC | PRN
Start: 2015-06-04 — End: 2015-06-04
  Administered 2015-06-04: 10 mL via INTRAVENOUS
  Filled 2015-06-04: qty 10

## 2015-06-04 NOTE — Progress Notes (Signed)
[place]  [phone]  [phone]     Madison Christensen DOB: 26-Apr-1950  MR#: [id number]  YBW#:[id number]  Patient Care Team: Jerrol Banana., MD as PCP - General (Family Medicine) Robert Bellow, MD as Consulting Physician (General Surgery) Nicholaus Bloom, MD as Consulting Physician (Plastic Surgery) Tammy Rondell Reams, Guinda (Family Medicine)  CHIEF COMPLAINT:  Chief Complaint  Patient presents with  . Breast Cancer    INTERVAL HISTORY:  Patient is here for further evaluation and treatment consideration regarding carcinoma of breast. At last visit approximately 4 weeks ago patients chemotherapy treatments were held due to increasing joint pain and body aches. She has received a total of 9 cycles of Herceptin. She reports that aches and pains have resolved. She denies any acute changes but continues to have some fatigue.   REVIEW OF SYSTEMS:   Review of Systems  Constitutional: Positive for malaise/fatigue. Negative for fever, chills, weight loss and diaphoresis.  HENT: Negative.   Eyes: Negative.   Respiratory: Negative for cough, hemoptysis, sputum production, shortness of breath and wheezing.   Cardiovascular: Negative for chest pain, palpitations, orthopnea, claudication, leg swelling and PND.  Gastrointestinal: Negative for heartburn, nausea, vomiting, abdominal pain, diarrhea, constipation, blood in stool and melena.  Genitourinary: Negative.   Musculoskeletal: Negative.   Skin: Negative.   Neurological: Positive for weakness. Negative for dizziness, tingling, focal weakness and seizures.  Endo/Heme/Allergies: Does not bruise/bleed easily.  Psychiatric/Behavioral: Negative for depression. The patient is not nervous/anxious and does not have insomnia.     As per HPI. Otherwise, a complete review of systems is negatve.  ONCOLOGY HISTORY: Oncology History   1.  Abnormal mammogram in April of 2016 followed by ultrasound and based  on clinical examination 1 cm tumor.  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  (July 05, 2014) 2.  Status post lumpectomy and sentinel lymph node evaluation tumor size is 1.4 cm sentinel lymph nodes negative.  Stage IC T1 cN0 M0 tumor 3.  Patient is going for breast reduction surgery in June of 2016 4.  Adjuvant chemotherapy started with Taxol and Herceptin on September 04, 2014 5. Patient received total 10 treatment of Taxol and has been discontinued course of progressing neuropathy Patient was referred to radiation therapy (November 15, 2014) 6. Herceptin has been discontinued because of progressive complaints of generalized aches and pains. Last treatment of Herceptin was given on March 27, 2015 2016     Carcinoma of left breast [facility])   07/05/2014 Initial Diagnosis Carcinoma of left breast    PAST MEDICAL HISTORY: Past Medical History  Diagnosis Date  . Hypertension   . Hyperlipidemia   . GERD (gastroesophageal reflux disease)   . Depression 1995  . Cancer ([place])     skin   . Carcinoma of left breast ([place]) 07/30/2014    1.4 cm, T1c, N0, ER negative, PR negative, HER-2 positive  . Complication of anesthesia 2004    oversensitive to noise, lights    PAST SURGICAL HISTORY: Past Surgical History  Procedure Laterality Date  . Back surgery    . Tonsillectomy    . Skin cancer  removal  2015    nose   . Colonoscopy  2013    Dr. Vira Agar  . Breast lumpectomy with axillary lymph node dissection Left 08/03/2014    Procedure: BREAST LUMPECTOMY WITH AXILLARY LYMPH NODE DISSECTION;  Surgeon: Robert Bellow, MD;  Location: [facility] ORS;  Service: General;  Laterality: Left;  . Sentinel node biopsy Left 08/03/2014    Procedure: SENTINEL NODE BIOPSY;  Surgeon: Robert Bellow, MD;  Location: [facility] ORS;  Service: General;  Laterality: Left;  . Axillary lymph node biopsy Left 08/03/2014    Procedure:  AXILLARY LYMPH NODE BIOPSY;  Surgeon: Robert Bellow, MD;  Location: [facility] ORS;  Service: General;  Laterality: Left;  . Breast surgery Left Aug 03, 2014    Wide excision, sentinel node biopsy.  . Portacath placement Right 08/31/2014    Procedure: INSERTION PORT-A-CATH;  Surgeon: Robert Bellow, MD;  Location: [facility] ORS;  Service: General;  Laterality: Right;  . Colonoscopy with propofol N/A 05/30/2015    Procedure: COLONOSCOPY WITH PROPOFOL;  Surgeon: Manya Silvas, MD;  Location: [facility] ENDOSCOPY;  Service: Endoscopy;  Laterality: N/A;    FAMILY HISTORY Family History  Problem Relation Age of Onset  . Breast cancer Mother 44       . Breast cancer Cousin 62         GYNECOLOGIC HISTORY:  No LMP recorded. Patient is postmenopausal.     ADVANCED DIRECTIVES:    HEALTH MAINTENANCE: Social History  Substance Use Topics  . Smoking status: Former Smoker -- 1.00 packs/day for 2 years    Types: Cigarettes    Quit date: 07/30/1976  . Smokeless tobacco: Not on file  . Alcohol Use: 0.0 - 1.2 oz/week    0-1 Glasses of wine, 0-1 Shots of liquor, 0 Standard drinks or equivalent per week    Allergies  Allergen Reactions  . Sulfa Antibiotics Hives and Rash    Current Outpatient Prescriptions  Medication Sig Dispense Refill  . chlorpheniramine-HYDROcodone (TUSSIONEX) 10-8 MG/5ML SUER Take 5 mLs by mouth every 12 (twelve) hours as needed for cough. 140 mL 0  . citalopram (CELEXA) 20 MG tablet Take 1 tablet (20 mg total) by mouth daily. 30 tablet 6  . gabapentin (NEURONTIN) 100 MG capsule Take 1 capsule (100 mg total) by mouth 3 (three) times daily. 90 capsule 3  . HYDROcodone-acetaminophen (NORCO) 5-325 MG per tablet Take 1-2 tablets by mouth every 4 (four) hours as needed for moderate pain or severe pain. 30 tablet 0  . KLOR-CON M20 20 MEQ tablet Take 2 tablets (40 mEq total) by mouth 2 (two) times daily. 90 tablet 3  . KLOR-CON M20 20 MEQ tablet TAKE 1 TABLET BY MOUTH 3 TIMES A DAY  90 tablet 2  . lidocaine-prilocaine (EMLA) cream Apply 1 application topically as needed. 30 g 3  . omeprazole (PRILOSEC) 40 MG capsule Take 40 mg by mouth every morning.   4  . ondansetron (ZOFRAN) 8 MG tablet Take 1 tablet (8 mg total) by mouth 2 (two) times daily. Start the day after chemo for 2 days. Then take as needed for nausea or vomiting. 30 tablet 1  . pravastatin (PRAVACHOL) 40 MG tablet Take 40 mg by mouth every morning.   3  . silver sulfADIAZINE (SILVADENE) 1 % cream Apply 1 application topically 2 (two) times daily. 50 g 2  . triamterene-hydrochlorothiazide (MAXZIDE) 75-50 MG per tablet Take 1 tablet by mouth every morning.   4   No current facility-administered medications for this visit.   Facility-Administered Medications Ordered in Other Visits  Medication Dose Route Frequency Provider Last Rate Last Dose  . 0.9 %  sodium chloride infusion   Intravenous Continuous Manya Silvas, MD      . acetaminophen (TYLENOL) tablet  650 mg  650 mg Oral Once Forest Gleason, MD      . diphenhydrAMINE (BENADRYL) capsule 25 mg  25 mg Oral Once Forest Gleason, MD   25 mg at 03/27/15 1605  . diphenhydrAMINE (BENADRYL) capsule 50 mg  50 mg Oral Once Forest Gleason, MD      . heparin lock flush 100 unit/mL  500 Units Intracatheter Once PRN Forest Gleason, MD      . sodium chloride 0.9 % injection 10 mL  10 mL Intravenous PRN Forest Gleason, MD   10 mL at 09/25/14 1418  . sodium chloride 0.9 % injection 10 mL  10 mL Intravenous PRN Forest Gleason, MD   10 mL at 01/15/15 1325  . sodium chloride 0.9 % injection 10 mL  10 mL Intracatheter PRN Forest Gleason, MD   10 mL at 03/27/15 1415    OBJECTIVE: BP 113/72 mmHg  Pulse 70  Temp(Src) 98.3 F (36.8 C) (Tympanic)  Resp 18  Wt 225 lb 12 oz (102.4 kg)   Body mass index is 37.57 kg/(m^2).    ECOG FS:1 - Symptomatic but completely ambulatory  General: Well-developed, well-nourished, no acute distress. Lungs: Clear to auscultation bilaterally. Heart: Regular  rate and rhythm. No rubs, murmurs, or gallops. Abdomen: Soft, nontender, nondistended. No organomegaly noted, normoactive bowel sounds. Musculoskeletal: No edema, cyanosis, or clubbing. Neuro: Alert, answering all questions appropriately. Cranial nerves grossly intact. Skin: No rashes or petechiae noted. Psych: Normal affect. Lymphatics: No cervical, clavicular LAD.   LAB RESULTS:  Appointment on 06/04/2015  Component Date Value Ref Range Status  . WBC 06/04/2015 7.7  3.6 - 11.0 K/uL Final  . RBC 06/04/2015 5.20  3.80 - 5.20 MIL/uL Final  . Hemoglobin 06/04/2015 14.8  12.0 - 16.0 g/dL Final  . HCT 06/04/2015 42.6  35.0 - 47.0 % Final  . MCV 06/04/2015 81.9  80.0 - 100.0 fL Final  . MCH 06/04/2015 28.4  26.0 - 34.0 pg Final  . MCHC 06/04/2015 34.6  32.0 - 36.0 g/dL Final  . RDW 06/04/2015 14.4  11.5 - 14.5 % Final  . Platelets 06/04/2015 182  150 - 440 K/uL Final  . Neutrophils Relative % 06/04/2015 53   Final  . Neutro Abs 06/04/2015 4.1  1.4 - 6.5 K/uL Final  . Lymphocytes Relative 06/04/2015 38   Final  . Lymphs Abs 06/04/2015 2.9  1.0 - 3.6 K/uL Final  . Monocytes Relative 06/04/2015 8   Final  . Monocytes Absolute 06/04/2015 0.6  0.2 - 0.9 K/uL Final  . Eosinophils Relative 06/04/2015 1   Final  . Eosinophils Absolute 06/04/2015 0.1  0 - 0.7 K/uL Final  . Basophils Relative 06/04/2015 0   Final  . Basophils Absolute 06/04/2015 0.0  0 - 0.1 K/uL Final  . Sodium 06/04/2015 137  135 - 145 mmol/L Final  . Potassium 06/04/2015 3.1* 3.5 - 5.1 mmol/L Final  . Chloride 06/04/2015 102  101 - 111 mmol/L Final  . CO2 06/04/2015 28  22 - 32 mmol/L Final  . Glucose, Bld 06/04/2015 99  65 - 99 mg/dL Final  . BUN 06/04/2015 17  6 - 20 mg/dL Final  . Creatinine, Ser 06/04/2015 1.01* 0.44 - 1.00 mg/dL Final  . Calcium 06/04/2015 8.8* 8.9 - 10.3 mg/dL Final  . Total Protein 06/04/2015 7.8  6.5 - 8.1 g/dL Final  . Albumin 06/04/2015 4.1  3.5 - 5.0 g/dL Final  . AST 06/04/2015 24  15 - 41  U/L Final  .  ALT 06/04/2015 20  14 - 54 U/L Final  . Alkaline Phosphatase 06/04/2015 56  38 - 126 U/L Final  . Total Bilirubin 06/04/2015 1.0  0.3 - 1.2 mg/dL Final  . GFR calc non Af Amer 06/04/2015 58* >60 mL/min Final  . GFR calc Af Amer 06/04/2015 >60  >60 mL/min Final   Comment: (NOTE) The eGFR has been calculated using the CKD EPI equation. This calculation has not been validated in all clinical situations. eGFR's persistently <60 mL/min signify possible Chronic Kidney Disease.   . Anion gap 06/04/2015 7  5 - 15 Final  . Magnesium 06/04/2015 1.8  1.7 - 2.4 mg/dL Final    STUDIES: No results found.  ASSESSMENT:  Carcinoma of left breast, stage IA, T1c N0 M0, ER/PR negative, Her-2 positive.  PLAN:   1. Left breast cancer. S/p lumpectomy. She has received a total of 9 cycles of Herceptin and has had improvement in her aches and pains with discontinuation. Discussed with Dr. Oliva Bustard and decision made to hold any further chemotherapy.  2. Depression/ fatigue. Patient was recently started on citalopram 39m daily. She reports some improvement in her overall mood and fatigue. Advised to continue taking medication daily.   We will reevaluate patient in approximately 4 months.   Patient expressed understanding and was in agreement with this plan. She also understands that She can call clinic at any time with any questions, concerns, or complaints.   Dr. COliva Bustardwas available for consultation and review of plan of care for this patient.  Carcinoma of left breast ([facility]   Staging form: Breast, AJCC 7th Edition     Clinical: Stage IA (T1c, N0, M0) - Unsigned   LEvlyn Kanner NP   06/04/2015 10:28 AM

## 2015-06-19 ENCOUNTER — Other Ambulatory Visit: Payer: Self-pay | Admitting: Family Medicine

## 2015-06-19 DIAGNOSIS — Z853 Personal history of malignant neoplasm of breast: Secondary | ICD-10-CM

## 2015-07-01 ENCOUNTER — Other Ambulatory Visit: Payer: Self-pay | Admitting: Oncology

## 2015-07-17 ENCOUNTER — Inpatient Hospital Stay: Payer: BLUE CROSS/BLUE SHIELD

## 2015-07-17 ENCOUNTER — Inpatient Hospital Stay: Payer: BLUE CROSS/BLUE SHIELD | Attending: Oncology

## 2015-07-17 DIAGNOSIS — C50912 Malignant neoplasm of unspecified site of left female breast: Secondary | ICD-10-CM | POA: Diagnosis present

## 2015-07-17 DIAGNOSIS — Z95828 Presence of other vascular implants and grafts: Secondary | ICD-10-CM

## 2015-07-17 DIAGNOSIS — Z452 Encounter for adjustment and management of vascular access device: Secondary | ICD-10-CM | POA: Diagnosis not present

## 2015-07-17 DIAGNOSIS — Z171 Estrogen receptor negative status [ER-]: Secondary | ICD-10-CM | POA: Diagnosis not present

## 2015-07-17 MED ORDER — HEPARIN SOD (PORK) LOCK FLUSH 100 UNIT/ML IV SOLN
500.0000 [IU] | Freq: Once | INTRAVENOUS | Status: AC
  Administered 2015-07-17: 500 [IU] via INTRAVENOUS

## 2015-07-17 MED ORDER — SODIUM CHLORIDE 0.9% FLUSH
10.0000 mL | INTRAVENOUS | Status: DC | PRN
  Administered 2015-07-17: 10 mL via INTRAVENOUS
  Filled 2015-07-17: qty 10

## 2015-07-17 NOTE — Progress Notes (Signed)
Survivorship Care Plan visit completed.  Treatment summary reviewed and given to patient.  ASCO answers booklet reviewed and given to patient.  CARE program and Cancer Transitions discussed.  Patients want to participate in Cancer Transitions.  Other resources discussed available through the cancer center.

## 2015-07-18 ENCOUNTER — Ambulatory Visit
Admission: RE | Admit: 2015-07-18 | Discharge: 2015-07-18 | Disposition: A | Discharge status: Home / Self Care | Payer: BLUE CROSS/BLUE SHIELD | Source: Ambulatory Visit | Attending: Family Medicine | Admitting: Family Medicine

## 2015-07-18 DIAGNOSIS — Z853 Personal history of malignant neoplasm of breast: Secondary | ICD-10-CM | POA: Diagnosis present

## 2015-07-18 HISTORY — DX: Malignant neoplasm of unspecified site of unspecified female breast: C50.919

## 2015-08-30 ENCOUNTER — Ambulatory Visit: Payer: BLUE CROSS/BLUE SHIELD | Admitting: Radiation Oncology

## 2015-09-02 ENCOUNTER — Other Ambulatory Visit: Payer: Self-pay | Admitting: Oncology

## 2015-09-03 ENCOUNTER — Ambulatory Visit: Payer: BLUE CROSS/BLUE SHIELD | Admitting: Family Medicine

## 2015-09-03 ENCOUNTER — Other Ambulatory Visit: Payer: BLUE CROSS/BLUE SHIELD

## 2015-09-03 NOTE — Telephone Encounter (Signed)
   Ref Range 73mo ago    Sodium 135 - 145 mmol/L 137   Potassium 3.5 - 5.1 mmol/L 3.1 (L)

## 2015-09-04 ENCOUNTER — Encounter: Payer: Self-pay | Admitting: Radiation Oncology

## 2015-09-04 ENCOUNTER — Ambulatory Visit: Payer: BLUE CROSS/BLUE SHIELD

## 2015-09-04 ENCOUNTER — Ambulatory Visit
Admission: RE | Admit: 2015-09-04 | Discharge: 2015-09-04 | Disposition: A | Discharge status: Home / Self Care | Payer: BLUE CROSS/BLUE SHIELD | Source: Ambulatory Visit | Attending: Radiation Oncology | Admitting: Radiation Oncology

## 2015-09-04 ENCOUNTER — Inpatient Hospital Stay (HOSPITAL_BASED_OUTPATIENT_CLINIC_OR_DEPARTMENT_OTHER): Payer: BLUE CROSS/BLUE SHIELD | Admitting: Family Medicine

## 2015-09-04 ENCOUNTER — Inpatient Hospital Stay: Payer: BLUE CROSS/BLUE SHIELD | Attending: Family Medicine

## 2015-09-04 VITALS — BP 147/82 | HR 88 | Temp 97.3°F | Ht 65.0 in | Wt 227.2 lb

## 2015-09-04 VITALS — BP 142/78 | HR 82 | Temp 97.8°F | Wt 227.5 lb

## 2015-09-04 DIAGNOSIS — Z87891 Personal history of nicotine dependence: Secondary | ICD-10-CM | POA: Diagnosis not present

## 2015-09-04 DIAGNOSIS — K219 Gastro-esophageal reflux disease without esophagitis: Secondary | ICD-10-CM | POA: Insufficient documentation

## 2015-09-04 DIAGNOSIS — Z79899 Other long term (current) drug therapy: Secondary | ICD-10-CM

## 2015-09-04 DIAGNOSIS — C50912 Malignant neoplasm of unspecified site of left female breast: Secondary | ICD-10-CM

## 2015-09-04 DIAGNOSIS — Z171 Estrogen receptor negative status [ER-]: Secondary | ICD-10-CM | POA: Insufficient documentation

## 2015-09-04 DIAGNOSIS — Z923 Personal history of irradiation: Secondary | ICD-10-CM | POA: Insufficient documentation

## 2015-09-04 DIAGNOSIS — I10 Essential (primary) hypertension: Secondary | ICD-10-CM | POA: Diagnosis not present

## 2015-09-04 DIAGNOSIS — Z9221 Personal history of antineoplastic chemotherapy: Secondary | ICD-10-CM | POA: Insufficient documentation

## 2015-09-04 DIAGNOSIS — E785 Hyperlipidemia, unspecified: Secondary | ICD-10-CM | POA: Insufficient documentation

## 2015-09-04 DIAGNOSIS — Z85828 Personal history of other malignant neoplasm of skin: Secondary | ICD-10-CM

## 2015-09-04 DIAGNOSIS — Z853 Personal history of malignant neoplasm of breast: Secondary | ICD-10-CM | POA: Insufficient documentation

## 2015-09-04 DIAGNOSIS — R5383 Other fatigue: Secondary | ICD-10-CM | POA: Diagnosis not present

## 2015-09-04 DIAGNOSIS — F329 Major depressive disorder, single episode, unspecified: Secondary | ICD-10-CM

## 2015-09-04 DIAGNOSIS — R0989 Other specified symptoms and signs involving the circulatory and respiratory systems: Secondary | ICD-10-CM | POA: Insufficient documentation

## 2015-09-04 DIAGNOSIS — E876 Hypokalemia: Secondary | ICD-10-CM

## 2015-09-04 DIAGNOSIS — R05 Cough: Secondary | ICD-10-CM | POA: Diagnosis not present

## 2015-09-04 LAB — COMPREHENSIVE METABOLIC PANEL
ALT: 30 U/L (ref 14–54)
AST: 27 U/L (ref 15–41)
Albumin: 4.1 g/dL (ref 3.5–5.0)
Alkaline Phosphatase: 58 U/L (ref 38–126)
Anion gap: 9 (ref 5–15)
BUN: 15 mg/dL (ref 6–20)
CALCIUM: 9.2 mg/dL (ref 8.9–10.3)
CHLORIDE: 101 mmol/L (ref 101–111)
CO2: 28 mmol/L (ref 22–32)
CREATININE: 1.04 mg/dL — AB (ref 0.44–1.00)
GFR, EST NON AFRICAN AMERICAN: 56 mL/min — AB (ref >60)
Glucose, Bld: 107 mg/dL — ABNORMAL HIGH (ref 65–99)
Potassium: 3.5 mmol/L (ref 3.5–5.1)
Sodium: 138 mmol/L (ref 135–145)
TOTAL PROTEIN: 7.6 g/dL (ref 6.5–8.1)
Total Bilirubin: 1.2 mg/dL (ref 0.3–1.2)

## 2015-09-04 LAB — CBC WITH DIFFERENTIAL/PLATELET
Basophils Absolute: 0.1 10*3/uL (ref 0–0.1)
Basophils Relative: 1 %
EOS ABS: 0.1 10*3/uL (ref 0–0.7)
EOS PCT: 1 %
HCT: 43.6 % (ref 35.0–47.0)
HEMOGLOBIN: 15.1 g/dL (ref 12.0–16.0)
LYMPHS ABS: 3.2 10*3/uL (ref 1.0–3.6)
Lymphocytes Relative: 36 %
MCH: 29.4 pg (ref 26.0–34.0)
MCHC: 34.5 g/dL (ref 32.0–36.0)
MCV: 85.2 fL (ref 80.0–100.0)
MONOS PCT: 8 %
Monocytes Absolute: 0.7 10*3/uL (ref 0.2–0.9)
Neutro Abs: 4.7 10*3/uL (ref 1.4–6.5)
Neutrophils Relative %: 54 %
PLATELETS: 172 10*3/uL (ref 150–440)
RBC: 5.12 MIL/uL (ref 3.80–5.20)
RDW: 13.6 % (ref 11.5–14.5)
WBC: 8.8 10*3/uL (ref 3.6–11.0)

## 2015-09-04 LAB — MAGNESIUM: MAGNESIUM: 1.8 mg/dL (ref 1.7–2.4)

## 2015-09-04 MED ORDER — SODIUM CHLORIDE 0.9% FLUSH
10.0000 mL | INTRAVENOUS | Status: DC | PRN
  Administered 2015-09-04: 10 mL via INTRAVENOUS
  Filled 2015-09-04: qty 10

## 2015-09-04 MED ORDER — CEPHALEXIN 500 MG PO CAPS: 1000.0000 mg | ORAL_CAPSULE | Freq: Two times a day (BID) | ORAL | Status: DC

## 2015-09-04 MED ORDER — HEPARIN SOD (PORK) LOCK FLUSH 100 UNIT/ML IV SOLN
500.0000 [IU] | Freq: Once | INTRAVENOUS | Status: AC
Start: 2015-09-04 — End: 2015-09-04
  Administered 2015-09-04: 500 [IU] via INTRAVENOUS

## 2015-09-04 NOTE — Progress Notes (Signed)
[place]  [phone]) [phone]  Fax:(336) [phone]     NATILEE GAUER DOB: 1950/10/03  MR#: [id number]  SPQ#:[id number]  Patient Care Team: Jerrol Banana., MD as PCP - General (Family Medicine) Robert Bellow, MD as Consulting Physician (General Surgery) Nicholaus Bloom, MD as Consulting Physician (Plastic Surgery) Tammy Rondell Reams, [place] (Family Medicine)  CHIEF COMPLAINT:  Chief Complaint  Patient presents with  . Breast Cancer  . Follow-up    INTERVAL HISTORY:  Patient is here for further evaluation and treatment consideration regarding carcinoma of breast. At last visit approximately 4 months ago patients chemotherapy treatments were held due to increasing joint pain and body aches. She received a total of 9 cycles of Herceptin. She reports that aches and pains have resolved. Patient's most recent mammogram was performed in May 2017. Patient reports overall feeling fairly well. She is currently suffering from cold like symptoms. She reports having a cough and congestion for several weeks now. She has been using Tussionex liquid mainly at bedtime for rest. She reports coughing up green sputum at times and that at times it's clear. She has not had any fever, chills.  REVIEW OF SYSTEMS:   Review of Systems  Constitutional: Negative for fever, chills, weight loss, malaise/fatigue and diaphoresis.  HENT: Positive for congestion.   Eyes: Negative.   Respiratory: Positive for cough and sputum production. Negative for hemoptysis, shortness of breath and wheezing.   Cardiovascular: Negative for chest pain, palpitations, orthopnea, claudication, leg swelling and PND.  Gastrointestinal: Negative for heartburn, nausea, vomiting, abdominal pain, diarrhea, constipation, blood in stool and melena.  Genitourinary: Negative.   Musculoskeletal: Negative.   Skin: Negative.   Neurological: Negative for dizziness, tingling, focal weakness, seizures and weakness.    Endo/Heme/Allergies: Does not bruise/bleed easily.  Psychiatric/Behavioral: Negative for depression. The patient is not nervous/anxious and does not have insomnia.     As per HPI. Otherwise, a complete review of systems is negatve.  ONCOLOGY HISTORY: Oncology History   1.  Abnormal mammogram in April of 2016 followed by ultrasound and based on clinical examination 1 cm tumor.  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  (July 05, 2014) 2.  Status post lumpectomy and sentinel lymph node evaluation tumor size is 1.4 cm sentinel lymph nodes negative.  Stage IC T1 cN0 M0 tumor 3.  Patient is going for breast reduction surgery in June of 2016 4.  Adjuvant chemotherapy started with Taxol and Herceptin on September 04, 2014 5. Patient received total 10 treatment of Taxol and has been discontinued course of progressing neuropathy Patient was referred to radiation therapy (November 15, 2014) 6. Herceptin has been discontinued because of progressive complaints of generalized aches and pains. Last treatment of Herceptin was given on March 27, 2015 2016     Carcinoma of left breast [facility])   07/05/2014 Initial Diagnosis Carcinoma of left breast    PAST MEDICAL HISTORY: Past Medical History  Diagnosis Date  . Hypertension   . Hyperlipidemia   . GERD (gastroesophageal reflux disease)   . Depression 1995  . Cancer ([place])     skin   . Carcinoma of left breast ([place]) 07/30/2014    1.4 cm, T1c, N0, ER negative, PR negative, HER-2 positive  . Complication of anesthesia 2004    oversensitive to noise, lights  . Breast cancer ([place]) 2016    left    PAST SURGICAL HISTORY: Past  Surgical History  Procedure Laterality Date  . Back surgery    . Tonsillectomy    . Skin cancer  removal  2015    nose   . Colonoscopy  2013    Dr. Vira Agar  . Breast lumpectomy with axillary lymph node dissection Left 08/03/2014     Procedure: BREAST LUMPECTOMY WITH AXILLARY LYMPH NODE DISSECTION;  Surgeon: Robert Bellow, MD;  Location: [facility] ORS;  Service: General;  Laterality: Left;  . Sentinel node biopsy Left 08/03/2014    Procedure: SENTINEL NODE BIOPSY;  Surgeon: Robert Bellow, MD;  Location: [facility] ORS;  Service: General;  Laterality: Left;  . Axillary lymph node biopsy Left 08/03/2014    Procedure: AXILLARY LYMPH NODE BIOPSY;  Surgeon: Robert Bellow, MD;  Location: [facility] ORS;  Service: General;  Laterality: Left;  . Breast surgery Left Aug 03, 2014    Wide excision, sentinel node biopsy.  . Portacath placement Right 08/31/2014    Procedure: INSERTION PORT-A-CATH;  Surgeon: Robert Bellow, MD;  Location: [facility] ORS;  Service: General;  Laterality: Right;  . Colonoscopy with propofol N/A 05/30/2015    Procedure: COLONOSCOPY WITH PROPOFOL;  Surgeon: Manya Silvas, MD;  Location: [facility] ENDOSCOPY;  Service: Endoscopy;  Laterality: N/A;    FAMILY HISTORY Family History  Problem Relation Age of Onset  . Breast cancer Mother 33       . Breast cancer Cousin 62         GYNECOLOGIC HISTORY:  No LMP recorded. Patient is postmenopausal.     ADVANCED DIRECTIVES:    HEALTH MAINTENANCE: Social History  Substance Use Topics  . Smoking status: Former Smoker -- 1.00 packs/day for 2 years    Types: Cigarettes    Quit date: 07/30/1976  . Smokeless tobacco: Not on file  . Alcohol Use: 0.0 - 1.2 oz/week    0-1 Glasses of wine, 0-1 Shots of liquor, 0 Standard drinks or equivalent per week    Allergies  Allergen Reactions  . Sulfa Antibiotics Hives and Rash    Current Outpatient Prescriptions  Medication Sig Dispense Refill  . chlorpheniramine-HYDROcodone (TUSSIONEX) 10-8 MG/5ML SUER Take 5 mLs by mouth every 12 (twelve) hours as needed for cough. 140 mL 0  . citalopram (CELEXA) 20 MG tablet Take 1 tablet (20 mg total) by mouth daily. 30 tablet 6  . colestipol (COLESTID) 1 g tablet TAKE 2 TABLETS (2 G  TOTAL) BY MOUTH 2 (TWO) TIMES DAILY.  1  . gabapentin (NEURONTIN) 100 MG capsule TAKE 1 CAPSULE (100 MG TOTAL) BY MOUTH 3 (THREE) TIMES DAILY. 90 capsule 1  . HYDROcodone-acetaminophen (NORCO) 5-325 MG per tablet Take 1-2 tablets by mouth every 4 (four) hours as needed for moderate pain or severe pain. 30 tablet 0  . KLOR-CON M20 20 MEQ tablet Take 2 tablets (40 mEq total) by mouth 2 (two) times daily. 90 tablet 3  . KLOR-CON M20 20 MEQ tablet TAKE 1 TABLET BY MOUTH 3 TIMES A DAY 90 tablet 2  . KLOR-CON M20 20 MEQ tablet TAKE 1 TABLET BY MOUTH 3 TIMES A DAY 90 tablet 2  . lidocaine-prilocaine (EMLA) cream Apply 1 application topically as needed. 30 g 3  . ondansetron (ZOFRAN) 8 MG tablet Take 1 tablet (8 mg total) by mouth 2 (two) times daily. Start the day after chemo for 2 days. Then take as needed for nausea or vomiting. 30 tablet 1  . pravastatin (PRAVACHOL) 40 MG tablet Take 40 mg by mouth every morning.  3  . silver sulfADIAZINE (SILVADENE) 1 % cream Apply 1 application topically 2 (two) times daily. 50 g 2  . triamterene-hydrochlorothiazide (MAXZIDE) 75-50 MG per tablet Take 1 tablet by mouth every morning.   4  . cephALEXin (KEFLEX) 500 MG capsule Take 2 capsules (1,000 mg total) by mouth 2 (two) times daily. 14 capsule 0  . omeprazole (PRILOSEC) 40 MG capsule Take 40 mg by mouth every morning.   4   No current facility-administered medications for this visit.   Facility-Administered Medications Ordered in Other Visits  Medication Dose Route Frequency Provider Last Rate Last Dose  . 0.9 %  sodium chloride infusion   Intravenous Continuous Manya Silvas, MD      . acetaminophen (TYLENOL) tablet 650 mg  650 mg Oral Once Forest Gleason, MD      . diphenhydrAMINE (BENADRYL) capsule 25 mg  25 mg Oral Once Forest Gleason, MD   25 mg at 03/27/15 1605  . diphenhydrAMINE (BENADRYL) capsule 50 mg  50 mg Oral Once Forest Gleason, MD      . heparin lock flush 100 unit/mL  500 Units Intracatheter Once  PRN Forest Gleason, MD      . sodium chloride 0.9 % injection 10 mL  10 mL Intravenous PRN Forest Gleason, MD   10 mL at 09/25/14 1418  . sodium chloride 0.9 % injection 10 mL  10 mL Intravenous PRN Forest Gleason, MD   10 mL at 01/15/15 1325  . sodium chloride 0.9 % injection 10 mL  10 mL Intracatheter PRN Forest Gleason, MD   10 mL at 03/27/15 1415  . sodium chloride flush (NS) 0.9 % injection 10 mL  10 mL Intravenous PRN Cammie Sickle, MD   10 mL at 09/04/15 1119    OBJECTIVE: BP 147/82 mmHg  Pulse 88  Temp(Src) 97.3 F (36.3 C) (Tympanic)  Ht 5' 5"  (1.651 m)  Wt 227 lb 3.2 oz (103.057 kg)  BMI 37.81 kg/m2   Body mass index is 37.81 kg/(m^2).    ECOG FS:0 - Asymptomatic  General: Well-developed, well-nourished, no acute distress. Lungs: Clear to auscultation bilaterally. Heart: Regular rate and rhythm. No rubs, murmurs, or gallops. Breast: Deferred, patient had breast exam with Dr. Baruch Gouty this morning. Abdomen: Soft, nontender, nondistended. No organomegaly noted, normoactive bowel sounds. Musculoskeletal: No edema, cyanosis, or clubbing. Neuro: Alert, answering all questions appropriately. Cranial nerves grossly intact. Skin: No rashes or petechiae noted. Psych: Normal affect. Lymphatics: No cervical, clavicular LAD.   LAB RESULTS:  Appointment on 09/04/2015  Component Date Value Ref Range Status  . WBC 09/04/2015 8.8  3.6 - 11.0 K/uL Final  . RBC 09/04/2015 5.12  3.80 - 5.20 MIL/uL Final  . Hemoglobin 09/04/2015 15.1  12.0 - 16.0 g/dL Final  . HCT 09/04/2015 43.6  35.0 - 47.0 % Final  . MCV 09/04/2015 85.2  80.0 - 100.0 fL Final  . MCH 09/04/2015 29.4  26.0 - 34.0 pg Final  . MCHC 09/04/2015 34.5  32.0 - 36.0 g/dL Final  . RDW 09/04/2015 13.6  11.5 - 14.5 % Final  . Platelets 09/04/2015 172  150 - 440 K/uL Final  . Neutrophils Relative % 09/04/2015 54   Final  . Neutro Abs 09/04/2015 4.7  1.4 - 6.5 K/uL Final  . Lymphocytes Relative 09/04/2015 36   Final  . Lymphs  Abs 09/04/2015 3.2  1.0 - 3.6 K/uL Final  . Monocytes Relative 09/04/2015 8   Final  . Monocytes Absolute 09/04/2015 0.7  0.2 - 0.9 K/uL Final  . Eosinophils Relative 09/04/2015 1   Final  . Eosinophils Absolute 09/04/2015 0.1  0 - 0.7 K/uL Final  . Basophils Relative 09/04/2015 1   Final  . Basophils Absolute 09/04/2015 0.1  0 - 0.1 K/uL Final  . Magnesium 09/04/2015 1.8  1.7 - 2.4 mg/dL Final  . Sodium 09/04/2015 138  135 - 145 mmol/L Final  . Potassium 09/04/2015 3.5  3.5 - 5.1 mmol/L Final  . Chloride 09/04/2015 101  101 - 111 mmol/L Final  . CO2 09/04/2015 28  22 - 32 mmol/L Final  . Glucose, Bld 09/04/2015 107* 65 - 99 mg/dL Final  . BUN 09/04/2015 15  6 - 20 mg/dL Final  . Creatinine, Ser 09/04/2015 1.04* 0.44 - 1.00 mg/dL Final  . Calcium 09/04/2015 9.2  8.9 - 10.3 mg/dL Final  . Total Protein 09/04/2015 7.6  6.5 - 8.1 g/dL Final  . Albumin 09/04/2015 4.1  3.5 - 5.0 g/dL Final  . AST 09/04/2015 27  15 - 41 U/L Final  . ALT 09/04/2015 30  14 - 54 U/L Final  . Alkaline Phosphatase 09/04/2015 58  38 - 126 U/L Final  . Total Bilirubin 09/04/2015 1.2  0.3 - 1.2 mg/dL Final  . GFR calc non Af Amer 09/04/2015 56* >60 mL/min Final  . GFR calc Af Amer 09/04/2015 >60  >60 mL/min Final   Comment: (NOTE) The eGFR has been calculated using the CKD EPI equation. This calculation has not been validated in all clinical situations. eGFR's persistently <60 mL/min signify possible Chronic Kidney Disease.   . Anion gap 09/04/2015 9  5 - 15 Final    STUDIES: No results found.  ASSESSMENT:  Carcinoma of left breast, stage IA, T1c N0 M0, ER/PR negative, Her-2 positive.  PLAN:   1. Left breast cancer. S/p lumpectomy. She received a total of 9 cycles of Herceptin and has had improvement in her aches and pains with discontinuation. Patient is ER/PR negative so there is no necessity for an aromatase inhibitor. Her most recent mammogram was performed in May 2017 and reported as benign. Clinically  there is no evidence of recurrent disease. 2. Depression/ fatigue. Patient was recently started on citalopram 68m daily. She reports some improvement in her overall mood and fatigue. Advised to continue taking medication daily.   We will reevaluate patient in approximately 6 months.   Patient expressed understanding and was in agreement with this plan. She also understands that She can call clinic at any time with any questions, concerns, or complaints.   Dr. FGrayland Ormondwas available for consultation and review of plan of care for this patient.  Carcinoma of left breast ([facility]   Staging form: Breast, AJCC 7th Edition     Clinical: Stage IA (T1c, N0, M0) - Unsigned   LEvlyn Kanner NP   09/04/2015 3:16 PM

## 2015-09-04 NOTE — Progress Notes (Signed)
Radiation Oncology Follow up Note  Name: Madison Christensen   Date:   09/04/2015 MRN:  [id number] DOB: December 10, 1950    This 65 y.o. female presents to the clinic today for six-month follow-up for stage IC ER/PR negative HER-2/neu overexpressed invasive mammary carcinoma left breast status post wide local excision and adjuvant whole breast radiation.  REFERRING PROVIDER: Forest Gleason, MD  HPI: Patient is a 65 year old female now out 6 months having completed whole breast radiation to her left breast for stage I C ER/PR negative HER-2/neu overexpressed invasive mammary carcinoma. She is seen today in routine follow-up and is doing well.. She has completed 9 cycles of Herceptin and the neck has been discontinued based on exacerbation of joint pain. She specifically denies breast tenderness cough or bone pain. Still having some tenderness in her left axilla which I attributed to scar formation.  COMPLICATIONS OF TREATMENT: none  FOLLOW UP COMPLIANCE: keeps appointments   PHYSICAL EXAM:  BP 142/78 mmHg  Pulse 82  Temp(Src) 97.8 F (36.6 C)  Wt 227 lb 8.2 oz (103.2 kg) Moderately obese female in NAD. She's had bilateral breast reconstruction no dominant mass is noted in the right breast in 2 positions examined. She does have some scarring in the lumpectomy site and firmness in that region unchanged from prior examination. No axillary or supraclavicular adenopathy is identified. Well-developed well-nourished patient in NAD. HEENT reveals PERLA, EOMI, discs not visualized.  Oral cavity is clear. No oral mucosal lesions are identified. Neck is clear without evidence of cervical or supraclavicular adenopathy. Lungs are clear to A&P. Cardiac examination is essentially unremarkable with regular rate and rhythm without murmur rub or thrill. Abdomen is benign with no organomegaly or masses noted. Motor sensory and DTR levels are equal and symmetric in the upper and lower extremities. Cranial nerves II  through XII are grossly intact. Proprioception is intact. No peripheral adenopathy or edema is identified. No motor or sensory levels are noted. Crude visual fields are within normal range.  RADIOLOGY RESULTS: Follow-up mammograms are to be ordered by surgeon  PLAN: Present time patient is doing well. I've emphasized the need for some activity and continue movement of her left upper extremity to help with some of the scar formation. Otherwise I'm please were overall progress. I've asked to see her back in 6 months for follow-up. She continues close follow-up care with surgeon. Patient is to call with any concerns.  I would like to take this opportunity to thank you for allowing me to participate in the care of your patient.Armstead Peaks., MD

## 2015-09-04 NOTE — Progress Notes (Signed)
Patient here for breast cancer follow up. Has seen Dr. Donella Stade today and discussed breast pain.

## 2015-11-01 ENCOUNTER — Other Ambulatory Visit: Payer: Self-pay | Admitting: *Deleted

## 2015-11-01 MED ORDER — GABAPENTIN 100 MG PO CAPS: ORAL_CAPSULE | ORAL | 2 refills | Status: DC

## 2015-11-01 NOTE — Addendum Note (Signed)
Addended by: Betti Cruz on: 11/01/2015 09:35 AM   Modules accepted: Orders

## 2015-11-16 ENCOUNTER — Inpatient Hospital Stay: Payer: BLUE CROSS/BLUE SHIELD

## 2015-11-16 DIAGNOSIS — N183 Chronic kidney disease, stage 3 unspecified: Secondary | ICD-10-CM | POA: Insufficient documentation

## 2015-11-16 DIAGNOSIS — E785 Hyperlipidemia, unspecified: Secondary | ICD-10-CM | POA: Insufficient documentation

## 2015-11-16 DIAGNOSIS — F331 Major depressive disorder, recurrent, moderate: Secondary | ICD-10-CM | POA: Insufficient documentation

## 2015-11-16 DIAGNOSIS — R053 Chronic cough: Secondary | ICD-10-CM | POA: Insufficient documentation

## 2015-11-16 DIAGNOSIS — R05 Cough: Secondary | ICD-10-CM | POA: Insufficient documentation

## 2015-11-16 DIAGNOSIS — I10 Essential (primary) hypertension: Secondary | ICD-10-CM | POA: Insufficient documentation

## 2015-11-16 DIAGNOSIS — K529 Noninfective gastroenteritis and colitis, unspecified: Secondary | ICD-10-CM | POA: Insufficient documentation

## 2015-11-21 ENCOUNTER — Inpatient Hospital Stay: Payer: BLUE CROSS/BLUE SHIELD

## 2015-11-27 ENCOUNTER — Other Ambulatory Visit: Payer: Self-pay | Admitting: Oncology

## 2015-11-27 ENCOUNTER — Inpatient Hospital Stay: Payer: BLUE CROSS/BLUE SHIELD | Attending: Oncology

## 2015-11-27 DIAGNOSIS — Z452 Encounter for adjustment and management of vascular access device: Secondary | ICD-10-CM | POA: Diagnosis not present

## 2015-11-27 DIAGNOSIS — C50912 Malignant neoplasm of unspecified site of left female breast: Secondary | ICD-10-CM

## 2015-11-27 DIAGNOSIS — Z853 Personal history of malignant neoplasm of breast: Secondary | ICD-10-CM | POA: Insufficient documentation

## 2015-11-27 DIAGNOSIS — F32A Depression, unspecified: Secondary | ICD-10-CM

## 2015-11-27 DIAGNOSIS — Z95828 Presence of other vascular implants and grafts: Secondary | ICD-10-CM

## 2015-11-27 DIAGNOSIS — F329 Major depressive disorder, single episode, unspecified: Secondary | ICD-10-CM

## 2015-11-27 MED ORDER — HEPARIN SOD (PORK) LOCK FLUSH 100 UNIT/ML IV SOLN
500.0000 [IU] | Freq: Once | INTRAVENOUS | Status: AC
Start: 2015-11-27 — End: 2015-11-27
  Administered 2015-11-27: 500 [IU] via INTRAVENOUS

## 2015-11-27 MED ORDER — SODIUM CHLORIDE 0.9% FLUSH
10.0000 mL | INTRAVENOUS | Status: DC | PRN
  Administered 2015-11-27: 10 mL via INTRAVENOUS
  Filled 2015-11-27: qty 10

## 2016-01-08 ENCOUNTER — Inpatient Hospital Stay: Payer: BLUE CROSS/BLUE SHIELD

## 2016-01-09 DIAGNOSIS — J449 Chronic obstructive pulmonary disease, unspecified: Secondary | ICD-10-CM | POA: Insufficient documentation

## 2016-01-15 ENCOUNTER — Inpatient Hospital Stay: Payer: Medicare HMO | Attending: Internal Medicine

## 2016-01-15 DIAGNOSIS — C50912 Malignant neoplasm of unspecified site of left female breast: Secondary | ICD-10-CM | POA: Diagnosis not present

## 2016-01-15 DIAGNOSIS — Z95828 Presence of other vascular implants and grafts: Secondary | ICD-10-CM

## 2016-01-15 DIAGNOSIS — Z452 Encounter for adjustment and management of vascular access device: Secondary | ICD-10-CM | POA: Insufficient documentation

## 2016-01-15 DIAGNOSIS — Z171 Estrogen receptor negative status [ER-]: Secondary | ICD-10-CM | POA: Diagnosis not present

## 2016-01-15 MED ORDER — SODIUM CHLORIDE 0.9% FLUSH
10.0000 mL | INTRAVENOUS | Status: DC | PRN
  Administered 2016-01-15: 10 mL via INTRAVENOUS
  Filled 2016-01-15: qty 10

## 2016-01-15 MED ORDER — HEPARIN SOD (PORK) LOCK FLUSH 100 UNIT/ML IV SOLN
500.0000 [IU] | Freq: Once | INTRAVENOUS | Status: AC
  Administered 2016-01-15: 500 [IU] via INTRAVENOUS

## 2016-02-26 ENCOUNTER — Inpatient Hospital Stay: Payer: BLUE CROSS/BLUE SHIELD

## 2016-02-26 ENCOUNTER — Inpatient Hospital Stay: Payer: Self-pay

## 2016-03-11 ENCOUNTER — Inpatient Hospital Stay: Payer: Medicare HMO

## 2016-03-11 ENCOUNTER — Inpatient Hospital Stay: Payer: Medicare HMO | Attending: Hematology and Oncology | Admitting: Hematology and Oncology

## 2016-03-11 VITALS — BP 127/70 | HR 68 | Temp 95.9°F | Resp 18 | Wt 235.5 lb

## 2016-03-11 DIAGNOSIS — E785 Hyperlipidemia, unspecified: Secondary | ICD-10-CM | POA: Diagnosis not present

## 2016-03-11 DIAGNOSIS — C50212 Malignant neoplasm of upper-inner quadrant of left female breast: Secondary | ICD-10-CM

## 2016-03-11 DIAGNOSIS — Z853 Personal history of malignant neoplasm of breast: Secondary | ICD-10-CM

## 2016-03-11 DIAGNOSIS — G629 Polyneuropathy, unspecified: Secondary | ICD-10-CM | POA: Diagnosis not present

## 2016-03-11 DIAGNOSIS — Z87891 Personal history of nicotine dependence: Secondary | ICD-10-CM

## 2016-03-11 DIAGNOSIS — Z923 Personal history of irradiation: Secondary | ICD-10-CM | POA: Diagnosis not present

## 2016-03-11 DIAGNOSIS — I10 Essential (primary) hypertension: Secondary | ICD-10-CM

## 2016-03-11 DIAGNOSIS — F329 Major depressive disorder, single episode, unspecified: Secondary | ICD-10-CM | POA: Diagnosis not present

## 2016-03-11 DIAGNOSIS — Z95828 Presence of other vascular implants and grafts: Secondary | ICD-10-CM

## 2016-03-11 DIAGNOSIS — Z79899 Other long term (current) drug therapy: Secondary | ICD-10-CM

## 2016-03-11 DIAGNOSIS — Z7982 Long term (current) use of aspirin: Secondary | ICD-10-CM

## 2016-03-11 DIAGNOSIS — Z85828 Personal history of other malignant neoplasm of skin: Secondary | ICD-10-CM | POA: Diagnosis not present

## 2016-03-11 DIAGNOSIS — Z171 Estrogen receptor negative status [ER-]: Secondary | ICD-10-CM

## 2016-03-11 DIAGNOSIS — K219 Gastro-esophageal reflux disease without esophagitis: Secondary | ICD-10-CM

## 2016-03-11 DIAGNOSIS — Z9221 Personal history of antineoplastic chemotherapy: Secondary | ICD-10-CM

## 2016-03-11 DIAGNOSIS — C50912 Malignant neoplasm of unspecified site of left female breast: Secondary | ICD-10-CM

## 2016-03-11 LAB — CBC WITH DIFFERENTIAL/PLATELET
Basophils Absolute: 0.1 10*3/uL (ref 0–0.1)
Basophils Relative: 1 %
Eosinophils Absolute: 0.1 10*3/uL (ref 0–0.7)
Eosinophils Relative: 2 %
HCT: 42.9 % (ref 35.0–47.0)
Hemoglobin: 14.7 g/dL (ref 12.0–16.0)
Lymphocytes Relative: 44 %
Lymphs Abs: 2.7 10*3/uL (ref 1.0–3.6)
MCH: 29.5 pg (ref 26.0–34.0)
MCHC: 34.2 g/dL (ref 32.0–36.0)
MCV: 86.3 fL (ref 80.0–100.0)
Monocytes Absolute: 0.5 10*3/uL (ref 0.2–0.9)
Monocytes Relative: 8 %
Neutro Abs: 2.7 10*3/uL (ref 1.4–6.5)
Neutrophils Relative %: 45 %
Platelets: 154 10*3/uL (ref 150–440)
RBC: 4.97 MIL/uL (ref 3.80–5.20)
RDW: 13.6 % (ref 11.5–14.5)
WBC: 6.1 10*3/uL (ref 3.6–11.0)

## 2016-03-11 LAB — COMPREHENSIVE METABOLIC PANEL
ALT: 23 U/L (ref 14–54)
AST: 26 U/L (ref 15–41)
Albumin: 4.1 g/dL (ref 3.5–5.0)
Alkaline Phosphatase: 48 U/L (ref 38–126)
Anion gap: 7 (ref 5–15)
BUN: 17 mg/dL (ref 6–20)
CO2: 25 mmol/L (ref 22–32)
Calcium: 8.8 mg/dL — ABNORMAL LOW (ref 8.9–10.3)
Chloride: 105 mmol/L (ref 101–111)
Creatinine, Ser: 0.96 mg/dL (ref 0.44–1.00)
GFR calc Af Amer: >60 mL/min (ref >60)
GFR calc non Af Amer: >60 mL/min (ref >60)
Glucose, Bld: 104 mg/dL — ABNORMAL HIGH (ref 65–99)
Potassium: 3.4 mmol/L — ABNORMAL LOW (ref 3.5–5.1)
Sodium: 137 mmol/L (ref 135–145)
Total Bilirubin: 1.5 mg/dL — ABNORMAL HIGH (ref 0.3–1.2)
Total Protein: 7.2 g/dL (ref 6.5–8.1)

## 2016-03-11 MED ORDER — HEPARIN SOD (PORK) LOCK FLUSH 100 UNIT/ML IV SOLN
500.0000 [IU] | Freq: Once | INTRAVENOUS | Status: AC
  Administered 2016-03-11: 500 [IU] via INTRAVENOUS

## 2016-03-11 MED ORDER — SODIUM CHLORIDE 0.9% FLUSH
10.0000 mL | INTRAVENOUS | Status: DC | PRN
  Administered 2016-03-11: 10 mL via INTRAVENOUS
  Filled 2016-03-11: qty 10

## 2016-03-11 MED ORDER — LIDOCAINE-PRILOCAINE 2.5-2.5 % EX CREA: 1.0000 "application " | TOPICAL_CREAM | CUTANEOUS | 1 refills | Status: DC | PRN

## 2016-03-11 NOTE — Progress Notes (Signed)
[facility] day:  03/11/2016   Chief Complaint: Madison Christensen is a 66 y.o. female with stage IA Her2/neu + left breast cancer who is seen for reassessment.  HPI:  The patient was diagnosed with breast cancer in 2016 after presenting with an abnormal mammogram.  Bilateral screening mammogram on 06/27/2014 revealed a possible mass in the left breast.  Diagnostic left mammogram and ultrasound on 07/04/2014 revealed a 10 x 8 x 7 mm spiculated mass in the left breast at the 10 o'clock position.  She underwent ultrasound core biopsy on 07/05/2014.  Pathology revealed a grade III invasive ductal carcinoma, no special type.  Lymphovascular invasion was noted.  Tumor was ER -, PR -, and Her2/neu 3+ by IHC.  She underwent wide excision on 08/03/2014 by Dr. Hervey Ard.  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  Lymphvascular invasion was indeterminate.  DCIS was present.  Three sentinel lymph nodes were negative.  Pathologic stage was T1cN0M0.  She began adjuvant Taxol and Herceptin on 09/04/2014.  She received 10 of 12 weeks of Taxol (last 11/06/2014).  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  Herceptin completed on 03/27/2015.  Herceptin was discontinued secondary to generalized aches and pains.  She completed radiation therapy in 02/2015.  Bilateral mammogram on 07/18/2015 revealed no evidence of malignancy.  The patient was last seen in the medical oncology clinic on 09/04/2015 by Georgeanne Nim, NP.  At that time, she was doing well without evidence of recurrent disease.  Symptomatically, she feels good.  She notes some breast pain at times if she lays on it.  She is on Neurontin for a mild residual neuropathy.   Past Medical History:  Diagnosis Date  . Breast cancer ([place]) 2016   left  . Cancer ([place])    skin   . Carcinoma of left breast ([place]) 07/30/2014   1.4 cm, T1c, N0, ER negative, PR  negative, HER-2 positive  . Complication of anesthesia 2004   oversensitive to noise, lights  . Depression 1995  . GERD (gastroesophageal reflux disease)   . Hyperlipidemia   . Hypertension     Past Surgical History:  Procedure Laterality Date  . AXILLARY LYMPH NODE BIOPSY Left 08/03/2014   Procedure: AXILLARY LYMPH NODE BIOPSY;  Surgeon: Robert Bellow, MD;  Location: [facility] ORS;  Service: General;  Laterality: Left;  . BACK SURGERY    . BREAST LUMPECTOMY WITH AXILLARY LYMPH NODE DISSECTION Left 08/03/2014   Procedure: BREAST LUMPECTOMY WITH AXILLARY LYMPH NODE DISSECTION;  Surgeon: Robert Bellow, MD;  Location: [facility] ORS;  Service: General;  Laterality: Left;  . BREAST SURGERY Left Aug 03, 2014   Wide excision, sentinel node biopsy.  . COLONOSCOPY  2013   Dr. Vira Agar  . COLONOSCOPY WITH PROPOFOL N/A 05/30/2015   Procedure: COLONOSCOPY WITH PROPOFOL;  Surgeon: Manya Silvas, MD;  Location: [facility] ENDOSCOPY;  Service: Endoscopy;  Laterality: N/A;  . PORTACATH PLACEMENT Right 08/31/2014   Procedure: INSERTION PORT-A-CATH;  Surgeon: Robert Bellow, MD;  Location: [facility] ORS;  Service: General;  Laterality: Right;  . SENTINEL NODE BIOPSY Left 08/03/2014   Procedure: SENTINEL NODE BIOPSY;  Surgeon: Robert Bellow, MD;  Location: [facility] ORS;  Service: General;  Laterality: Left;  . skin cancer  removal  2015   nose   . TONSILLECTOMY      Family History  Problem Relation Age of Onset  . Breast cancer Mother 72       .  Breast cancer Cousin 62         Social History:  reports that she quit smoking about 39 years ago. Her smoking use included Cigarettes. She has a 2.00 pack-year smoking history. She does not have any smokeless tobacco history on file. She reports that she drinks alcohol. She reports that she does not use drugs.  She is retired from [employer].  She lives in [place].  The patient is accompanied by her sister, Madison Christensen, today.  Allergies:  Allergies  Allergen  Reactions  . Losartan Other (See Comments)  . Sulfa Antibiotics Hives and Rash    Current Medications: Current Outpatient Prescriptions  Medication Sig Dispense Refill  . citalopram (CELEXA) 20 MG tablet TAKE 1 TABLET (20 MG TOTAL) BY MOUTH DAILY. 30 tablet 5  . colestipol (COLESTID) 1 g tablet TAKE 2 TABLETS (2 G TOTAL) BY MOUTH 2 (TWO) TIMES DAILY.  1  . gabapentin (NEURONTIN) 100 MG capsule TAKE 1 CAPSULE (100 MG TOTAL) BY MOUTH 3 (THREE) TIMES DAILY. 90 capsule 2  . lidocaine-prilocaine (EMLA) cream Apply 1 application topically as needed. 30 g 3  . pravastatin (PRAVACHOL) 40 MG tablet Take 40 mg by mouth every morning.   3  . spironolactone (ALDACTONE) 25 MG tablet Take by mouth.    . triamterene-hydrochlorothiazide (MAXZIDE) 75-50 MG per tablet Take 1 tablet by mouth every morning.   4  . aspirin EC 81 MG tablet Take by mouth.    . cephALEXin (KEFLEX) 500 MG capsule Take 2 capsules (1,000 mg total) by mouth 2 (two) times daily. (Patient not taking: Reported on 03/11/2016) 14 capsule 0  . chlorpheniramine-HYDROcodone (TUSSIONEX) 10-8 MG/5ML SUER Take 5 mLs by mouth every 12 (twelve) hours as needed for cough. (Patient not taking: Reported on 03/11/2016) 140 mL 0  . HYDROcodone-acetaminophen (NORCO) 5-325 MG per tablet Take 1-2 tablets by mouth every 4 (four) hours as needed for moderate pain or severe pain. (Patient not taking: Reported on 03/11/2016) 30 tablet 0  . KLOR-CON M20 20 MEQ tablet Take 2 tablets (40 mEq total) by mouth 2 (two) times daily. (Patient not taking: Reported on 03/11/2016) 90 tablet 3  . KLOR-CON M20 20 MEQ tablet TAKE 1 TABLET BY MOUTH 3 TIMES A DAY (Patient not taking: Reported on 03/11/2016) 90 tablet 2  . KLOR-CON M20 20 MEQ tablet TAKE 1 TABLET BY MOUTH 3 TIMES A DAY (Patient not taking: Reported on 03/11/2016) 90 tablet 2  . omeprazole (PRILOSEC) 40 MG capsule Take 40 mg by mouth every morning.   4  . ondansetron (ZOFRAN) 8 MG tablet Take 1 tablet (8 mg total) by mouth 2  (two) times daily. Start the day after chemo for 2 days. Then take as needed for nausea or vomiting. (Patient not taking: Reported on 03/11/2016) 30 tablet 1  . silver sulfADIAZINE (SILVADENE) 1 % cream Apply 1 application topically 2 (two) times daily. (Patient not taking: Reported on 03/11/2016) 50 g 2   No current facility-administered medications for this visit.    Facility-Administered Medications Ordered in Other Visits  Medication Dose Route Frequency Provider Last Rate Last Dose  . 0.9 %  sodium chloride infusion   Intravenous Continuous Manya Silvas, MD      . acetaminophen (TYLENOL) tablet 650 mg  650 mg Oral Once Forest Gleason, MD      . diphenhydrAMINE (BENADRYL) capsule 25 mg  25 mg Oral Once Forest Gleason, MD      . diphenhydrAMINE (BENADRYL) capsule 50  mg  50 mg Oral Once Forest Gleason, MD      . heparin lock flush 100 unit/mL  500 Units Intracatheter Once PRN Forest Gleason, MD      . sodium chloride 0.9 % injection 10 mL  10 mL Intravenous PRN Forest Gleason, MD   10 mL at 09/25/14 1418  . sodium chloride 0.9 % injection 10 mL  10 mL Intravenous PRN Forest Gleason, MD   10 mL at 01/15/15 1325  . sodium chloride 0.9 % injection 10 mL  10 mL Intracatheter PRN Forest Gleason, MD   10 mL at 03/27/15 1415  . sodium chloride flush (NS) 0.9 % injection 10 mL  10 mL Intravenous PRN Cammie Sickle, MD   10 mL at 03/11/16 1031    Review of Systems:  GENERAL:  Feels good. No fevers or sweats.  Weight stable. PERFORMANCE STATUS (ECOG): 1 HEENT:  No visual changes, runny nose, sore throat, mouth sores or tenderness. Lungs: Shortness of breath with exertion.  No cough.  No hemoptysis. Cardiac:  No chest pain, palpitations, orthopnea, or PND. GI: No nausea, vomiting, diarrhea, constipation, melena or hematochezia.  Colonoscopy 05/30/2015. GU:  No urgency, frequency, dysuria, or hematuria. Musculoskeletal:  No back pain.  No joint pain.  No muscle tenderness. Extremities:  No pain or  swelling. Skin:  No rashes or skin changes. Neuro:  Neuropathy in fingers and toes (once in awhile).  No headache, weakness, balance or coordination issues. Endocrine:  No diabetes, thyroid issues, hot flashes or night sweats. Psych:  No mood changes, depression or anxiety. Pain:  No focal pain. Review of systems:  All other systems reviewed and found to be negative.  Physical Exam: Blood pressure 127/70, pulse 68, temperature (!) 95.9 F (35.5 C), temperature source Tympanic, resp. rate 18, weight 235 lb 7.2 oz (106.8 kg). GENERAL:  Well developed, well nourished, woman sitting comfortably in the exam room in no acute distress. MENTAL STATUS:  Alert and oriented to person, place and time. HEAD:  Short styled silver hair.  Normocephalic, atraumatic, face symmetric, no Cushingoid features. EYES:  Glasses.  Blue eyes.  Pupils equal round and reactive to light and accomodation.  No conjunctivitis or scleral icterus. ENT:  Oropharynx clear without lesion.  Tongue normal. Mucous membranes moist.  RESPIRATORY:  Clear to auscultation without rales, wheezes or rhonchi. CARDIOVASCULAR:  Regular rate and rhythm without murmur, rub or gallop. BREAST:  Right breast without masses, skin changes or nipple discharge.  Left breast with post-operative and post radiation changes.  No masses, skin changes or nipple discharge.  ABDOMEN:  Soft, non-tender, with active bowel sounds, and no hepatosplenomegaly.  No masses. SKIN:  No rashes, ulcers or lesions. EXTREMITIES: No edema, no skin discoloration or tenderness.  No palpable cords. LYMPH NODES: No palpable cervical, supraclavicular, axillary or inguinal adenopathy  PSYCH:  Appropriate.   Infusion on 03/11/2016  Component Date Value Ref Range Status  . WBC 03/11/2016 6.1  3.6 - 11.0 K/uL Final  . RBC 03/11/2016 4.97  3.80 - 5.20 MIL/uL Final  . Hemoglobin 03/11/2016 14.7  12.0 - 16.0 g/dL Final  . HCT 03/11/2016 42.9  35.0 - 47.0 % Final  . MCV  03/11/2016 86.3  80.0 - 100.0 fL Final  . MCH 03/11/2016 29.5  26.0 - 34.0 pg Final  . MCHC 03/11/2016 34.2  32.0 - 36.0 g/dL Final  . RDW 03/11/2016 13.6  11.5 - 14.5 % Final  . Platelets 03/11/2016 154  150 -  440 K/uL Final  . Neutrophils Relative % 03/11/2016 45  % Final  . Neutro Abs 03/11/2016 2.7  1.4 - 6.5 K/uL Final  . Lymphocytes Relative 03/11/2016 44  % Final  . Lymphs Abs 03/11/2016 2.7  1.0 - 3.6 K/uL Final  . Monocytes Relative 03/11/2016 8  % Final  . Monocytes Absolute 03/11/2016 0.5  0.2 - 0.9 K/uL Final  . Eosinophils Relative 03/11/2016 2  % Final  . Eosinophils Absolute 03/11/2016 0.1  0 - 0.7 K/uL Final  . Basophils Relative 03/11/2016 1  % Final  . Basophils Absolute 03/11/2016 0.1  0 - 0.1 K/uL Final  . Sodium 03/11/2016 137  135 - 145 mmol/L Final  . Potassium 03/11/2016 3.4* 3.5 - 5.1 mmol/L Final  . Chloride 03/11/2016 105  101 - 111 mmol/L Final  . CO2 03/11/2016 25  22 - 32 mmol/L Final  . Glucose, Bld 03/11/2016 104* 65 - 99 mg/dL Final  . BUN 03/11/2016 17  6 - 20 mg/dL Final  . Creatinine, Ser 03/11/2016 0.96  0.44 - 1.00 mg/dL Final  . Calcium 03/11/2016 8.8* 8.9 - 10.3 mg/dL Final  . Total Protein 03/11/2016 7.2  6.5 - 8.1 g/dL Final  . Albumin 03/11/2016 4.1  3.5 - 5.0 g/dL Final  . AST 03/11/2016 26  15 - 41 U/L Final  . ALT 03/11/2016 23  14 - 54 U/L Final  . Alkaline Phosphatase 03/11/2016 48  38 - 126 U/L Final  . Total Bilirubin 03/11/2016 1.5* 0.3 - 1.2 mg/dL Final  . GFR calc non Af Amer 03/11/2016 >60  >60 mL/min Final  . GFR calc Af Amer 03/11/2016 >60  >60 mL/min Final   Comment: (NOTE) The eGFR has been calculated using the CKD EPI equation. This calculation has not been validated in all clinical situations. eGFR's persistently <60 mL/min signify possible Chronic Kidney Disease.   Georgiann Hahn gap 03/11/2016 7  5 - 15 Final    Assessment:  Madison Christensen is a 66 y.o. female with stage IA Her2/neu + left breast cancer status wide  excision on  08/03/2014.  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  Lymphvascular invasion was indeterminate (+ on original biopsy).  DCIS was present.  Three sentinel lymph nodes were negative.  Tumor was ER -, PR -, and Her2/neu 3+ by IHC.  Pathologic stage was T1cN0M0.  She received adjuvant Taxol and Herceptin.  She received 10 of 12 weeks of Taxol (09/04/2014 - 11/06/2014).  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  She received Herceptin x 6 months (09/04/2014 - 03/27/2015).  Herceptin was discontinued secondary to myalgias.  She completed radiation therapy in 02/2015.  Bilateral mammogram on 07/18/2015 revealed no evidence of malignancy.  Symptomatically, she is fatigued.  Exam is unremarkable.  She is on Neurontin for a mild residual neuropathy.  Plan: 1.  Discuss diagnosis, staging and management of breast cancer.  Discuss no hormonal therapy as tumor was hormone receptor negative.  Discuss yearly mammograms (ordered by Dr. Bary Castilla). 2.  Obtain radiation summary. 3.  Labs today:  CBC with diff, CMP, CA27.29. 4.  Schedule mammogram on 07/17/2016. 5.  Bone density study. 6.  RTC in 4 months for MD assessment and labs (CBC with diff, CMP, CA27.29).   Lequita Asal, MD  03/11/2016, 11:17 AM

## 2016-03-12 LAB — CANCER ANTIGEN 27.29: CA 27.29: 16.3 U/mL (ref 0.0–38.6)

## 2016-03-25 ENCOUNTER — Ambulatory Visit: Payer: Medicare HMO | Admitting: Radiation Oncology

## 2016-03-28 ENCOUNTER — Other Ambulatory Visit: Payer: Self-pay | Admitting: Nurse Practitioner

## 2016-04-01 ENCOUNTER — Ambulatory Visit: Payer: Medicare HMO | Admitting: Radiation Oncology

## 2016-04-17 ENCOUNTER — Ambulatory Visit: Payer: Medicare HMO

## 2016-04-22 ENCOUNTER — Inpatient Hospital Stay: Payer: Medicare HMO

## 2016-04-22 ENCOUNTER — Ambulatory Visit: Payer: Medicare HMO

## 2016-04-29 ENCOUNTER — Observation Stay
Admission: EM | Admit: 2016-04-29 | Discharge: 2016-05-01 | Disposition: A | Discharge status: Home / Self Care | Payer: Medicare HMO | Attending: Internal Medicine | Admitting: Internal Medicine

## 2016-04-29 ENCOUNTER — Encounter: Payer: Self-pay | Admitting: Medical Oncology

## 2016-04-29 ENCOUNTER — Inpatient Hospital Stay: Payer: Medicare HMO

## 2016-04-29 ENCOUNTER — Emergency Department: Payer: Medicare HMO

## 2016-04-29 DIAGNOSIS — R0789 Other chest pain: Secondary | ICD-10-CM | POA: Insufficient documentation

## 2016-04-29 DIAGNOSIS — Z85828 Personal history of other malignant neoplasm of skin: Secondary | ICD-10-CM | POA: Insufficient documentation

## 2016-04-29 DIAGNOSIS — F329 Major depressive disorder, single episode, unspecified: Secondary | ICD-10-CM | POA: Insufficient documentation

## 2016-04-29 DIAGNOSIS — Z7951 Long term (current) use of inhaled steroids: Secondary | ICD-10-CM | POA: Insufficient documentation

## 2016-04-29 DIAGNOSIS — Z87440 Personal history of urinary (tract) infections: Secondary | ICD-10-CM | POA: Insufficient documentation

## 2016-04-29 DIAGNOSIS — Z9889 Other specified postprocedural states: Secondary | ICD-10-CM | POA: Diagnosis not present

## 2016-04-29 DIAGNOSIS — Z888 Allergy status to other drugs, medicaments and biological substances status: Secondary | ICD-10-CM | POA: Insufficient documentation

## 2016-04-29 DIAGNOSIS — I700 Atherosclerosis of aorta: Secondary | ICD-10-CM | POA: Diagnosis not present

## 2016-04-29 DIAGNOSIS — Z923 Personal history of irradiation: Secondary | ICD-10-CM | POA: Diagnosis not present

## 2016-04-29 DIAGNOSIS — J189 Pneumonia, unspecified organism: Secondary | ICD-10-CM | POA: Diagnosis not present

## 2016-04-29 DIAGNOSIS — R7989 Other specified abnormal findings of blood chemistry: Secondary | ICD-10-CM

## 2016-04-29 DIAGNOSIS — Z9221 Personal history of antineoplastic chemotherapy: Secondary | ICD-10-CM | POA: Insufficient documentation

## 2016-04-29 DIAGNOSIS — Z853 Personal history of malignant neoplasm of breast: Secondary | ICD-10-CM | POA: Insufficient documentation

## 2016-04-29 DIAGNOSIS — J440 Chronic obstructive pulmonary disease with acute lower respiratory infection: Secondary | ICD-10-CM | POA: Insufficient documentation

## 2016-04-29 DIAGNOSIS — Z803 Family history of malignant neoplasm of breast: Secondary | ICD-10-CM | POA: Insufficient documentation

## 2016-04-29 DIAGNOSIS — E785 Hyperlipidemia, unspecified: Secondary | ICD-10-CM | POA: Diagnosis not present

## 2016-04-29 DIAGNOSIS — N390 Urinary tract infection, site not specified: Secondary | ICD-10-CM | POA: Diagnosis not present

## 2016-04-29 DIAGNOSIS — J90 Pleural effusion, not elsewhere classified: Secondary | ICD-10-CM | POA: Insufficient documentation

## 2016-04-29 DIAGNOSIS — R748 Abnormal levels of other serum enzymes: Secondary | ICD-10-CM | POA: Insufficient documentation

## 2016-04-29 DIAGNOSIS — E876 Hypokalemia: Secondary | ICD-10-CM

## 2016-04-29 DIAGNOSIS — I119 Hypertensive heart disease without heart failure: Secondary | ICD-10-CM | POA: Diagnosis not present

## 2016-04-29 DIAGNOSIS — K219 Gastro-esophageal reflux disease without esophagitis: Secondary | ICD-10-CM | POA: Insufficient documentation

## 2016-04-29 DIAGNOSIS — Z7982 Long term (current) use of aspirin: Secondary | ICD-10-CM | POA: Diagnosis not present

## 2016-04-29 DIAGNOSIS — R0602 Shortness of breath: Secondary | ICD-10-CM | POA: Diagnosis not present

## 2016-04-29 DIAGNOSIS — R778 Other specified abnormalities of plasma proteins: Secondary | ICD-10-CM

## 2016-04-29 DIAGNOSIS — J209 Acute bronchitis, unspecified: Secondary | ICD-10-CM | POA: Insufficient documentation

## 2016-04-29 DIAGNOSIS — Z882 Allergy status to sulfonamides status: Secondary | ICD-10-CM | POA: Insufficient documentation

## 2016-04-29 DIAGNOSIS — Z79899 Other long term (current) drug therapy: Secondary | ICD-10-CM | POA: Insufficient documentation

## 2016-04-29 DIAGNOSIS — Z87891 Personal history of nicotine dependence: Secondary | ICD-10-CM | POA: Insufficient documentation

## 2016-04-29 DIAGNOSIS — I517 Cardiomegaly: Secondary | ICD-10-CM | POA: Insufficient documentation

## 2016-04-29 DIAGNOSIS — A419 Sepsis, unspecified organism: Secondary | ICD-10-CM

## 2016-04-29 DIAGNOSIS — J441 Chronic obstructive pulmonary disease with (acute) exacerbation: Secondary | ICD-10-CM | POA: Insufficient documentation

## 2016-04-29 HISTORY — DX: Chronic obstructive pulmonary disease, unspecified: J44.9

## 2016-04-29 LAB — BASIC METABOLIC PANEL
ANION GAP: 8 (ref 5–15)
BUN: 10 mg/dL (ref 6–20)
CHLORIDE: 103 mmol/L (ref 101–111)
CO2: 25 mmol/L (ref 22–32)
Calcium: 8.8 mg/dL — ABNORMAL LOW (ref 8.9–10.3)
Creatinine, Ser: 0.96 mg/dL (ref 0.44–1.00)
GFR calc Af Amer: >60 mL/min (ref >60)
Glucose, Bld: 138 mg/dL — ABNORMAL HIGH (ref 65–99)
POTASSIUM: 3.1 mmol/L — AB (ref 3.5–5.1)
Sodium: 136 mmol/L (ref 135–145)

## 2016-04-29 LAB — URINALYSIS, COMPLETE (UACMP) WITH MICROSCOPIC
BILIRUBIN URINE: NEGATIVE
GLUCOSE, UA: NEGATIVE mg/dL
Ketones, ur: 5 mg/dL — AB
NITRITE: NEGATIVE
PROTEIN: 30 mg/dL — AB
Specific Gravity, Urine: 1.013 (ref 1.005–1.030)
pH: 7 (ref 5.0–8.0)

## 2016-04-29 LAB — CBC
HEMATOCRIT: 43 % (ref 35.0–47.0)
HEMOGLOBIN: 15.3 g/dL (ref 12.0–16.0)
MCH: 30.7 pg (ref 26.0–34.0)
MCHC: 35.6 g/dL (ref 32.0–36.0)
MCV: 86.2 fL (ref 80.0–100.0)
Platelets: 151 10*3/uL (ref 150–440)
RBC: 4.99 MIL/uL (ref 3.80–5.20)
RDW: 13.3 % (ref 11.5–14.5)
WBC: 14.9 10*3/uL — AB (ref 3.6–11.0)

## 2016-04-29 LAB — LACTIC ACID, PLASMA
LACTIC ACID, VENOUS: 1.2 mmol/L (ref 0.5–1.9)
Lactic Acid, Venous: 1 mmol/L (ref 0.5–1.9)

## 2016-04-29 LAB — INFLUENZA PANEL BY PCR (TYPE A & B)
Influenza A By PCR: NEGATIVE
Influenza B By PCR: NEGATIVE

## 2016-04-29 LAB — TROPONIN I: Troponin I: 0.04 ng/mL (ref <0.03)

## 2016-04-29 LAB — BRAIN NATRIURETIC PEPTIDE: B Natriuretic Peptide: 67 pg/mL (ref 0.0–100.0)

## 2016-04-29 LAB — GLUCOSE, CAPILLARY: Glucose-Capillary: 195 mg/dL — ABNORMAL HIGH (ref 65–99)

## 2016-04-29 MED ORDER — CITALOPRAM HYDROBROMIDE 20 MG PO TABS
20.0000 mg | ORAL_TABLET | Freq: Every day | ORAL | Status: DC
  Administered 2016-04-29 – 2016-05-01 (×3): 20 mg via ORAL
  Filled 2016-04-29 (×3): qty 1

## 2016-04-29 MED ORDER — ACETAMINOPHEN 500 MG PO TABS
1000.0000 mg | ORAL_TABLET | Freq: Once | ORAL | Status: AC
  Administered 2016-04-29: 1000 mg via ORAL
  Filled 2016-04-29: qty 2

## 2016-04-29 MED ORDER — GABAPENTIN 100 MG PO CAPS
100.0000 mg | ORAL_CAPSULE | Freq: Three times a day (TID) | ORAL | Status: DC
  Administered 2016-04-29 – 2016-05-01 (×6): 100 mg via ORAL
  Filled 2016-04-29 (×6): qty 1

## 2016-04-29 MED ORDER — POTASSIUM CHLORIDE CRYS ER 20 MEQ PO TBCR
40.0000 meq | EXTENDED_RELEASE_TABLET | Freq: Once | ORAL | Status: AC
  Administered 2016-04-29: 40 meq via ORAL
  Filled 2016-04-29: qty 2

## 2016-04-29 MED ORDER — ONDANSETRON HCL 4 MG PO TABS
8.0000 mg | ORAL_TABLET | Freq: Two times a day (BID) | ORAL | Status: DC
  Administered 2016-04-29 – 2016-05-01 (×4): 8 mg via ORAL
  Filled 2016-04-29 (×4): qty 2

## 2016-04-29 MED ORDER — SODIUM CHLORIDE 0.9 % IV BOLUS (SEPSIS)
1000.0000 mL | Freq: Once | INTRAVENOUS | Status: AC
  Administered 2016-04-29: 1000 mL via INTRAVENOUS

## 2016-04-29 MED ORDER — ONDANSETRON HCL 4 MG/2ML IJ SOLN
4.0000 mg | Freq: Once | INTRAMUSCULAR | Status: AC
  Administered 2016-04-29: 4 mg via INTRAVENOUS
  Filled 2016-04-29: qty 2

## 2016-04-29 MED ORDER — ONDANSETRON HCL 4 MG PO TABS: 4.0000 mg | ORAL_TABLET | Freq: Four times a day (QID) | ORAL | Status: DC | PRN

## 2016-04-29 MED ORDER — SODIUM CHLORIDE 0.9 % IV SOLN: INTRAVENOUS | Status: DC

## 2016-04-29 MED ORDER — FLUTICASONE PROPIONATE 50 MCG/ACT NA SUSP
2.0000 | Freq: Every day | NASAL | Status: DC
  Filled 2016-04-29: qty 16

## 2016-04-29 MED ORDER — ASPIRIN 81 MG PO CHEW
324.0000 mg | CHEWABLE_TABLET | Freq: Once | ORAL | Status: AC
  Administered 2016-04-29: 324 mg via ORAL
  Filled 2016-04-29: qty 4

## 2016-04-29 MED ORDER — BISACODYL 5 MG PO TBEC
5.0000 mg | DELAYED_RELEASE_TABLET | Freq: Every day | ORAL | Status: DC | PRN
  Administered 2016-04-29: 5 mg via ORAL
  Filled 2016-04-29: qty 1

## 2016-04-29 MED ORDER — ASPIRIN EC 81 MG PO TBEC
81.0000 mg | DELAYED_RELEASE_TABLET | Freq: Every day | ORAL | Status: DC
  Administered 2016-04-29 – 2016-05-01 (×3): 81 mg via ORAL
  Filled 2016-04-29 (×3): qty 1

## 2016-04-29 MED ORDER — CEFTRIAXONE SODIUM-DEXTROSE 1-3.74 GM-% IV SOLR
1.0000 g | Freq: Once | INTRAVENOUS | Status: AC
  Administered 2016-04-29: 1 g via INTRAVENOUS
  Filled 2016-04-29: qty 50

## 2016-04-29 MED ORDER — METHYLPREDNISOLONE SODIUM SUCC 125 MG IJ SOLR
60.0000 mg | Freq: Two times a day (BID) | INTRAMUSCULAR | Status: DC
  Administered 2016-04-29 – 2016-04-30 (×4): 60 mg via INTRAVENOUS
  Filled 2016-04-29 (×4): qty 2

## 2016-04-29 MED ORDER — CEFTRIAXONE SODIUM-DEXTROSE 1-3.74 GM-% IV SOLR: 1.0000 g | INTRAVENOUS | Status: DC

## 2016-04-29 MED ORDER — DEXTROSE 5 % IV SOLN
1.0000 g | INTRAVENOUS | Status: DC
  Administered 2016-04-30: 1 g via INTRAVENOUS
  Filled 2016-04-29 (×2): qty 10

## 2016-04-29 MED ORDER — ENOXAPARIN SODIUM 40 MG/0.4ML ~~LOC~~ SOLN
40.0000 mg | SUBCUTANEOUS | Status: DC
  Administered 2016-04-29 – 2016-04-30 (×2): 40 mg via SUBCUTANEOUS
  Filled 2016-04-29 (×2): qty 0.4

## 2016-04-29 MED ORDER — DEXTROSE 5 % IV SOLN
500.0000 mg | Freq: Once | INTRAVENOUS | Status: AC
  Administered 2016-04-29: 500 mg via INTRAVENOUS
  Filled 2016-04-29: qty 500

## 2016-04-29 MED ORDER — TRIAMTERENE-HCTZ 37.5-25 MG PO TABS
2.0000 | ORAL_TABLET | ORAL | Status: DC
  Administered 2016-04-30 – 2016-05-01 (×2): 2 via ORAL
  Filled 2016-04-29 (×4): qty 2

## 2016-04-29 MED ORDER — DEXTROSE 5 % IV SOLN: 1.0000 g | Freq: Once | INTRAVENOUS | Status: DC

## 2016-04-29 MED ORDER — ACETAMINOPHEN 650 MG RE SUPP: 650.0000 mg | Freq: Four times a day (QID) | RECTAL | Status: DC | PRN

## 2016-04-29 MED ORDER — COLESTIPOL HCL 1 G PO TABS
2.0000 g | ORAL_TABLET | Freq: Every day | ORAL | Status: DC
  Administered 2016-04-29 – 2016-05-01 (×3): 2 g via ORAL
  Filled 2016-04-29 (×3): qty 2

## 2016-04-29 MED ORDER — ONDANSETRON HCL 4 MG/2ML IJ SOLN: 4.0000 mg | Freq: Four times a day (QID) | INTRAMUSCULAR | Status: DC | PRN

## 2016-04-29 MED ORDER — ACETAMINOPHEN 325 MG PO TABS
650.0000 mg | ORAL_TABLET | Freq: Four times a day (QID) | ORAL | Status: DC | PRN
  Administered 2016-04-30 – 2016-05-01 (×3): 650 mg via ORAL
  Filled 2016-04-29 (×3): qty 2

## 2016-04-29 MED ORDER — SODIUM CHLORIDE 0.9 % IV SOLN
INTRAVENOUS | Status: DC
  Administered 2016-04-29 – 2016-04-30 (×2): via INTRAVENOUS

## 2016-04-29 MED ORDER — DEXTROSE 5 % IV SOLN
500.0000 mg | INTRAVENOUS | Status: DC
  Administered 2016-04-30: 500 mg via INTRAVENOUS
  Filled 2016-04-29: qty 500

## 2016-04-29 MED ORDER — GUAIFENESIN ER 600 MG PO TB12
600.0000 mg | ORAL_TABLET | Freq: Two times a day (BID) | ORAL | Status: DC
  Administered 2016-04-29 – 2016-05-01 (×5): 600 mg via ORAL
  Filled 2016-04-29 (×7): qty 1

## 2016-04-29 MED ORDER — IPRATROPIUM-ALBUTEROL 0.5-2.5 (3) MG/3ML IN SOLN
3.0000 mL | Freq: Four times a day (QID) | RESPIRATORY_TRACT | Status: DC
  Administered 2016-04-29 – 2016-04-30 (×4): 3 mL via RESPIRATORY_TRACT
  Filled 2016-04-29 (×4): qty 3

## 2016-04-29 MED ORDER — VITAMIN D (ERGOCALCIFEROL) 1.25 MG (50000 UNIT) PO CAPS: 50000.0000 [IU] | ORAL_CAPSULE | ORAL | Status: DC

## 2016-04-29 MED ORDER — HYDROCOD POLST-CPM POLST ER 10-8 MG/5ML PO SUER: 5.0000 mL | Freq: Two times a day (BID) | ORAL | Status: DC | PRN

## 2016-04-29 MED ORDER — PRAVASTATIN SODIUM 40 MG PO TABS
40.0000 mg | ORAL_TABLET | ORAL | Status: DC
  Administered 2016-04-30 – 2016-05-01 (×2): 40 mg via ORAL
  Filled 2016-04-29 (×2): qty 1

## 2016-04-29 MED ORDER — HYDROCODONE-ACETAMINOPHEN 5-325 MG PO TABS
1.0000 | ORAL_TABLET | ORAL | Status: DC | PRN
  Administered 2016-04-30: 19:00:00 1 via ORAL
  Filled 2016-04-29: qty 1

## 2016-04-29 NOTE — ED Notes (Signed)
Patient on last day of 7 day course of Bactrim for UTI.  Patient with complaints of lower abdominal cramping, last BM 04/28/2016 that was normal, fever, body aches, and as patient describes is "the worst headache I have ever had." Patient complains of blurred vision and dizziness at times.  Patient denies any falls or near fall with dizziness.

## 2016-04-29 NOTE — ED Triage Notes (Signed)
Pt reports that she was diagnosed with UTI last week and placed on macrobid, since starting the abx pt reports that she has been having sob, nausea, headache. Pt in NAD at this time.

## 2016-04-29 NOTE — H&P (Signed)
[place] [facility] [place] NAME: Madison Christensen    MR#:  [id number]  DATE OF BIRTH:  01-29-1951  DATE OF ADMISSION:  04/29/2016  PRIMARY CARE PHYSICIAN: Wilhemena Durie, MD   REQUESTING/REFERRING PHYSICIAN: Eula Listen  CHIEF COMPLAINT:   Chief Complaint  Patient presents with  . Shortness of Breath  . Nausea  . Headache    HISTORY OF PRESENT ILLNESS: Zakirah Weingart  is a 66 y.o. 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 She states that she was recently treated for urinary tract infection with nitrofurantoin. She states that she has had a history of having shortness of breath but to a mild extent and has had chronic cough at nighttime who has been having shortness of breath and wheezing since yesterday. Patient also felt feverish. But did not check her temperature. Patient in the ER has a temperature 100. She also complains of nasal congestion cough. She denies any chest pain palpitations. Denies any urinary symptoms.  PAST MEDICAL HISTORY:   Past Medical History:  Diagnosis Date  . Breast cancer ([place]) 2016   left  . Cancer ([place])    skin   . Carcinoma of left breast ([place]) 07/30/2014   1.4 cm, T1c, N0, ER negative, PR negative, HER-2 positive  . Complication of anesthesia 2004   oversensitive to noise, lights  . Depression 1995  . GERD (gastroesophageal reflux disease)   . Hyperlipidemia   . Hypertension     PAST SURGICAL HISTORY:  Past Surgical History:  Procedure Laterality Date  . AXILLARY LYMPH NODE BIOPSY Left 08/03/2014   Procedure: AXILLARY LYMPH NODE BIOPSY;  Surgeon: Robert Bellow, MD;  Location: [facility] ORS;  Service: General;  Laterality: Left;  . BACK SURGERY    . BREAST LUMPECTOMY WITH AXILLARY LYMPH NODE DISSECTION Left 08/03/2014   Procedure: BREAST LUMPECTOMY  WITH AXILLARY LYMPH NODE DISSECTION;  Surgeon: Robert Bellow, MD;  Location: [facility] ORS;  Service: General;  Laterality: Left;  . BREAST SURGERY Left Aug 03, 2014   Wide excision, sentinel node biopsy.  . COLONOSCOPY  2013   Dr. Vira Agar  . COLONOSCOPY WITH PROPOFOL N/A 05/30/2015   Procedure: COLONOSCOPY WITH PROPOFOL;  Surgeon: Manya Silvas, MD;  Location: [facility] ENDOSCOPY;  Service: Endoscopy;  Laterality: N/A;  . PORTACATH PLACEMENT Right 08/31/2014   Procedure: INSERTION PORT-A-CATH;  Surgeon: Robert Bellow, MD;  Location: [facility] ORS;  Service: General;  Laterality: Right;  . SENTINEL NODE BIOPSY Left 08/03/2014   Procedure: SENTINEL NODE BIOPSY;  Surgeon: Robert Bellow, MD;  Location: [facility] ORS;  Service: General;  Laterality: Left;  . skin cancer  removal  2015   nose   . TONSILLECTOMY      SOCIAL HISTORY:  Social History  Substance Use Topics  . Smoking status: Former Smoker    Packs/day: 1.00    Years: 2.00    Types: Cigarettes    Quit date: 07/30/1976  . Smokeless tobacco: Not on file  . Alcohol use 0.0 - 1.2 oz/week    FAMILY HISTORY:  Family History  Problem Relation Age of Onset  . Breast cancer Mother 26       . Breast cancer Cousin 62         DRUG ALLERGIES:  Allergies  Allergen Reactions  . Losartan Other (See Comments)  .  Sulfa Antibiotics Hives and Rash    REVIEW OF SYSTEMS:   CONSTITUTIONAL: No fever,Positive  Fatigue and  weakness.  EYES: No blurred or double vision.  EARS, NOSE, AND THROAT: No tinnitus or ear pain. Positive sore throat  RESPIRATORY:Positive  cough,  positive shortness of breath,  positive wheezing or hemoptysis.  CARDIOVASCULAR: No chest pain, orthopnea, edema.  GASTROINTESTINAL:  nausea, vomiting, diarrhea or abdominal pain.  GENITOURINARY: No dysuria, hematuria.  ENDOCRINE: No polyuria, nocturia,  HEMATOLOGY: No anemia, easy bruising or bleeding SKIN: No rash or lesion. MUSCULOSKELETAL: No joint pain or arthritis.    NEUROLOGIC: No tingling, numbness, weakness.  PSYCHIATRY: No anxiety or depression.   MEDICATIONS AT HOME:  Prior to Admission medications   Medication Sig Start Date End Date Taking? Authorizing Provider  aspirin EC 81 MG tablet Take by mouth.   Yes Historical Provider, MD  citalopram (CELEXA) 20 MG tablet TAKE 1 TABLET (20 MG TOTAL) BY MOUTH DAILY. 11/27/15  Yes Lloyd Huger, MD  colestipol (COLESTID) 1 g tablet TAKE 2 TABLETS (2 G TOTAL) BY MOUTH 2 (TWO) TIMES DAILY. 06/12/15  Yes Historical Provider, MD  fluticasone (FLONASE) 50 MCG/ACT nasal spray Place 2 sprays into the nose daily. 01/29/16 01/28/17 Yes Historical Provider, MD  gabapentin (NEURONTIN) 100 MG capsule TAKE 1 CAPSULE (100 MG TOTAL) BY MOUTH 3 (THREE) TIMES DAILY. Patient taking differently: Take 100 mg by mouth 3 (three) times daily. Patient reports taking 2 capsules am and 1 capsule pm daily 11/01/15  Yes Lloyd Huger, MD  lidocaine-prilocaine (EMLA) cream Apply 1 application topically as needed. 03/11/16  Yes Lequita Asal, MD  nitrofurantoin, macrocrystal-monohydrate, (MACROBID) 100 MG capsule Take 100 mg by mouth 2 (two) times daily. 04/22/16  Yes Historical Provider, MD  pravastatin (PRAVACHOL) 40 MG tablet Take 40 mg by mouth every morning.  06/14/14  Yes Historical Provider, MD  spironolactone (ALDACTONE) 25 MG tablet Take 25 mg by mouth 2 (two) times daily.  01/09/16 01/08/17 Yes Historical Provider, MD  triamterene-hydrochlorothiazide (MAXZIDE) 75-50 MG per tablet Take 1 tablet by mouth every morning.  06/18/14  Yes Historical Provider, MD  cephALEXin (KEFLEX) 500 MG capsule Take 2 capsules (1,000 mg total) by mouth 2 (two) times daily. Patient not taking: Reported on 03/11/2016 09/04/15   Evlyn Kanner, NP  chlorpheniramine-HYDROcodone (TUSSIONEX) 10-8 MG/5ML SUER Take 5 mLs by mouth every 12 (twelve) hours as needed for cough. Patient not taking: Reported on 03/11/2016 09/04/14   Forest Gleason, MD   HYDROcodone-acetaminophen (NORCO) 5-325 MG per tablet Take 1-2 tablets by mouth every 4 (four) hours as needed for moderate pain or severe pain. Patient not taking: Reported on 03/11/2016 08/03/14   Robert Bellow, MD  ondansetron (ZOFRAN) 8 MG tablet Take 1 tablet (8 mg total) by mouth 2 (two) times daily. Start the day after chemo for 2 days. Then take as needed for nausea or vomiting. Patient not taking: Reported on 03/11/2016 09/01/14   Forest Gleason, MD  silver sulfADIAZINE (SILVADENE) 1 % cream Apply 1 application topically 2 (two) times daily. Patient not taking: Reported on 03/11/2016 01/11/15   Noreene Filbert, MD  Vitamin D, Ergocalciferol, (DRISDOL) 50000 units CAPS capsule Take 50,000 Units by mouth once a week. Mondays 04/29/16 02/06/17  Historical Provider, MD      PHYSICAL EXAMINATION:   VITAL SIGNS: Blood pressure 132/63, pulse 95, temperature 100 F (37.8 C), temperature source Oral, resp. rate 20, height 5' 5"  (1.651 m), weight 229 lb (103.9 kg),  SpO2 100 %.  GENERAL:  66 y.o.-year-old patient lying in the bed with no acute distress.  EYES: Pupils equal, round, reactive to light and accommodation. No scleral icterus. Extraocular muscles intact.  HEENT: Head atraumatic, normocephalic. Oropharynx and nasopharynx clear.  NECK:  Supple, no jugular venous distention. No thyroid enlargement, no tenderness.  LUNGS:Decreased breath sounds bilaterally  No use of accessory muscles of respiration.  CARDIOVASCULAR: S1, S2 normal. No murmurs, rubs, or gallops.  ABDOMEN: Soft, nontender, nondistended. Bowel sounds present. No organomegaly or mass.  EXTREMITIES: No pedal edema, cyanosis, or clubbing.  NEUROLOGIC: Cranial nerves II through XII are intact. Muscle strength 5/5 in all extremities. Sensation intact. Gait not checked.  PSYCHIATRIC: The patient is alert and oriented x 3.  SKIN: No obvious rash, lesion, or ulcer.   LABORATORY PANEL:   CBC  Recent Labs Lab 04/29/16 0849  WBC  14.9*  HGB 15.3  HCT 43.0  PLT 151  MCV 86.2  MCH 30.7  MCHC 35.6  RDW 13.3   ------------------------------------------------------------------------------------------------------------------  Chemistries   Recent Labs Lab 04/29/16 0849  NA 136  K 3.1*  CL 103  CO2 25  GLUCOSE 138*  BUN 10  CREATININE 0.96  CALCIUM 8.8*   ------------------------------------------------------------------------------------------------------------------ estimated creatinine clearance is 69.9 mL/min (by C-G formula based on SCr of 0.96 mg/dL). ------------------------------------------------------------------------------------------------------------------ No results for input(s): TSH, T4TOTAL, T3FREE, THYROIDAB in the last 72 hours.  Invalid input(s): FREET3   Coagulation profile No results for input(s): INR, PROTIME in the last 168 hours. ------------------------------------------------------------------------------------------------------------------- No results for input(s): DDIMER in the last 72 hours. -------------------------------------------------------------------------------------------------------------------  Cardiac Enzymes  Recent Labs Lab 04/29/16 0849  TROPONINI 0.04*   ------------------------------------------------------------------------------------------------------------------ Invalid input(s): POCBNP  ---------------------------------------------------------------------------------------------------------------  Urinalysis    Component Value Date/Time   COLORURINE YELLOW (A) 04/29/2016 1000   APPEARANCEUR CLEAR (A) 04/29/2016 1000   LABSPEC 1.013 04/29/2016 1000   PHURINE 7.0 04/29/2016 1000   GLUCOSEU NEGATIVE 04/29/2016 1000   HGBUR SMALL (A) 04/29/2016 1000   BILIRUBINUR NEGATIVE 04/29/2016 1000   KETONESUR 5 (A) 04/29/2016 1000   PROTEINUR 30 (A) 04/29/2016 1000   NITRITE NEGATIVE 04/29/2016 1000   LEUKOCYTESUR TRACE (A) 04/29/2016 1000      RADIOLOGY: Dg Chest 2 View  Result Date: 04/29/2016 CLINICAL DATA:  Productive cough. EXAM: CHEST  2 VIEW COMPARISON:  08/31/2014 . FINDINGS: Port-A-Cath noted with lead tip at cavoatrial junction. Cardiomegaly. Mild bilateral from interstitial prominence noted consistent pneumonitis and/or interstitial edema. Small left pleural effusion. No pneumothorax. No acute bony abnormality . IMPRESSION: 1.  Port-A-Cath noted with tip at cavoatrial junction. 2. Bilateral mild interstitial prominence noted consistent with pneumonitis and/or interstitial edema. 3.  Cardiomegaly . Electronically Signed   By: Marcello Moores  Register   On: 04/29/2016 09:15    EKG: Orders placed or performed during the hospital encounter of 04/29/16  . ED EKG within 10 minutes  . ED EKG within 10 minutes  . EKG 12-Lead  . EKG 12-Lead    IMPRESSION AND PLAN: Patient is a 66 year old presenting with shortness of breath and low-grade temperature   1. Shortness of breath suspect could be related to early pneumonia As well as COPD exasperation We'll treat with IV antibiotics for now repeat chest x-ray in the morning after hydration I will place patient on nebulizer therapy as well as IV Solu-Medrol We will treat with mucinex and cough suppressive therapy  2. Essential hypertension patient currently hypotensive we'll hold blood pressure medications give her IV fluids  3. Hyperlipidemia unspecified  continue Pravachol  4. Miscellaneous Lovenox for DVT prophylaxis   All the records are reviewed and case discussed with ED provider. Management plans discussed with the patient, family and they are in agreement.  CODE STATUS: Code Status History    This patient does not have a recorded code status. Please follow your organizational policy for patients in this situation.       TOTAL TIME TAKING CARE OF THIS PATIENT: 55 minutes.    Dustin Flock M.D on 04/29/2016 at 11:37 AM  Between 7am to 6pm - Pager -  [phone]  After 6pm go to [email or web address] - password [email or web address] [place] [facility]  Office  [phone]  CC: Primary care physician; Wilhemena Durie, MD

## 2016-04-29 NOTE — ED Notes (Signed)
Admitting MD at bedside.

## 2016-04-29 NOTE — ED Notes (Addendum)
Patient with noted shortness of breath and desats to 89-90% on room with ambulation. Patient has acrylic nails.  Pulse oxymetry moved to eat lobe.  Oxygen saturations without fluctuation after pulse oxymetry moved to right ear lobe.

## 2016-04-29 NOTE — ED Notes (Signed)
Verbal report given to Leonides Schanz. RN

## 2016-04-29 NOTE — ED Provider Notes (Signed)
[facility] Emergency Department Provider Note  ____________________________________________  Time seen: Approximately 9:33 AM  I have reviewed the triage vital signs and the nursing notes.   HISTORY  Chief Complaint Shortness of Breath; Nausea; and Headache    HPI Madison Christensen is a 66 y.o. female w/ a remote hx of breast ca not on chemo, COPD, HTN presenting with generalized weakness, myalgias, nausea without vomiting, cough and shortness of breath. The patient reports that she was prescribed nitrofurantoin for a UTIlast week. Since then, she has had mild intermittent headaches, generalized weakness, myalgias, and nausea without vomiting, and shortness of breath. She has not had any diarrhea or chest pain. Here, she has a temperature 100.0. She does report a chronic cough which is worse when she lays down at night, it is nonproductive and unchanged compared to baseline. No associated congestion or rhinorrhea, sore throat, or ear pain. She denies any current lower extremity swelling although does report intermittent swelling. No calf pain.  Did have influenza vaccination this year.   Past Medical History:  Diagnosis Date  . Breast cancer ([place]) 2016   left  . Cancer ([place])    skin   . Carcinoma of left breast ([place]) 07/30/2014   1.4 cm, T1c, N0, ER negative, PR negative, HER-2 positive  . Complication of anesthesia 2004   oversensitive to noise, lights  . Depression 1995  . GERD (gastroesophageal reflux disease)   . Hyperlipidemia   . Hypertension     Patient Active Problem List   Diagnosis Date Noted  . Cancer ([place]) 10/02/2014  . Carcinoma of left breast ([place]) 07/30/2014  . Clinical depression 07/22/2014  . Decreased potassium in the blood 07/22/2014  . Left breast mass 07/12/2014    Past Surgical History:  Procedure Laterality Date  . AXILLARY LYMPH NODE BIOPSY Left 08/03/2014   Procedure: AXILLARY LYMPH NODE BIOPSY;  Surgeon: Robert Bellow, MD;  Location: [facility] ORS;  Service: General;  Laterality: Left;  . BACK SURGERY    . BREAST LUMPECTOMY WITH AXILLARY LYMPH NODE DISSECTION Left 08/03/2014   Procedure: BREAST LUMPECTOMY WITH AXILLARY LYMPH NODE DISSECTION;  Surgeon: Robert Bellow, MD;  Location: [facility] ORS;  Service: General;  Laterality: Left;  . BREAST SURGERY Left Aug 03, 2014   Wide excision, sentinel node biopsy.  . COLONOSCOPY  2013   Dr. Vira Agar  . COLONOSCOPY WITH PROPOFOL N/A 05/30/2015   Procedure: COLONOSCOPY WITH PROPOFOL;  Surgeon: Manya Silvas, MD;  Location: [facility] ENDOSCOPY;  Service: Endoscopy;  Laterality: N/A;  . PORTACATH PLACEMENT Right 08/31/2014   Procedure: INSERTION PORT-A-CATH;  Surgeon: Robert Bellow, MD;  Location: [facility] ORS;  Service: General;  Laterality: Right;  . SENTINEL NODE BIOPSY Left 08/03/2014   Procedure: SENTINEL NODE BIOPSY;  Surgeon: Robert Bellow, MD;  Location: [facility] ORS;  Service: General;  Laterality: Left;  . skin cancer  removal  2015   nose   . TONSILLECTOMY      Current Outpatient Rx  . Order #: [id number] Class: Historical Med  . Order #: [id number] Class: Normal  . Order #: [id number] Class: Historical Med  . Order #: [id number] Class: Historical Med  . Order #: [id number] Class: Normal  . Order #: [id number] Class: Normal  . Order #: [id number] Class: Historical Med  . Order #: [id number] Class: Historical Med  . Order #: [id number] Class: Historical Med  . Order #: [id number] Class: Historical Med  . Order #: [id number] Class: Normal  . Order #: [id number] Class: Print  . Order #: [id number] Class:  Print  . Order #: [id number] Class: Normal  . Order #: [id number] Class: Phone In  . Order #: [id number] Class: Historical Med    Allergies Losartan and Sulfa antibiotics  Family History  Problem Relation Age of Onset  . Breast cancer Mother 4       . Breast cancer Cousin 62         Social History Social History  Substance Use Topics  . Smoking status: Former Smoker     Packs/day: 1.00    Years: 2.00    Types: Cigarettes    Quit date: 07/30/1976  . Smokeless tobacco: Not on file  . Alcohol use 0.0 - 1.2 oz/week    Review of Systems Constitutional: Positive fever. Positive general malaise. Positive myalgias. Negative diaphoresis. No lightheadedness or syncope. Eyes: No visual changes. No eye discharge. ENT: No sore throat. No congestion or rhinorrhea. No ear pain. Cardiovascular: Denies chest pain. Denies palpitations. Respiratory: Positive shortness of breath.  Positive chronic unchanged cough. Gastrointestinal: No abdominal pain.  Positive nausea, no vomiting.  No diarrhea.  No constipation. Genitourinary: Negative for dysuria. Treated for UTI last week. Musculoskeletal: Negative for back pain. Skin: Negative for rash. Neurological: Positive for intermittent mild headaches. No focal numbness, tingling or weakness.   10-point ROS otherwise negative.  ____________________________________________   PHYSICAL EXAM:  VITAL SIGNS: ED Triage Vitals [04/29/16 0842]  Enc Vitals Group     BP (!) 148/73     Pulse Rate (!) 111     Resp 19     Temp 100 F (37.8 C)     Temp Source Oral     SpO2 95 %     Weight 230 lb (104.3 kg)     Height 5' 5"  (1.651 m)     Head Circumference      Peak Flow      Pain Score 5     Pain Loc      Pain Edu?      Excl. in [place]?     Constitutional: Alert and oriented. Well appearing and in no acute distress. Answers questions appropriately. Eyes: Conjunctivae are normal.  EOMI. No scleral icterus.No eye discharge. Head: Atraumatic. Nose: No congestion/rhinnorhea. Mouth/Throat: Mucous membranes are moist.  Neck: No stridor.  Supple.  No meningismus. Cardiovascular: Normal rate, regular rhythm. No murmurs, rubs or gallops.  Respiratory: Normal respiratory effort.  No accessory muscle use or retractions. Lungs CTAB.  No wheezes, rales or ronchi. O2 sats are 92-94% on my examination. Gastrointestinal: Obese. Soft,  nontender and nondistended.  No guarding or rebound.  No peritoneal signs. Musculoskeletal: No LE edema. No ttp in the calves or palpable cords.  Negative Homan's sign. Neurologic:  A&Ox3.  Speech is clear.  Face and smile are symmetric.  EOMI.  Moves all extremities well. Skin:  Skin is warm, dry and intact. No rash noted. Psychiatric: Mood and affect are normal. Speech and behavior are normal.  Normal judgement.  ____________________________________________   LABS (all labs ordered are listed, but only abnormal results are displayed)  Labs Reviewed  BASIC METABOLIC PANEL - Abnormal; Notable for the following:       Result Value   Potassium 3.1 (*)    Glucose, Bld 138 (*)    Calcium 8.8 (*)    All other components within normal limits  CBC - Abnormal; Notable for the following:    WBC 14.9 (*)    All other components within normal limits  TROPONIN I - Abnormal; Notable for the following:  Troponin I 0.04 (*)    All other components within normal limits  URINALYSIS, COMPLETE (UACMP) WITH MICROSCOPIC - Abnormal; Notable for the following:    Color, Urine YELLOW (*)    APPearance CLEAR (*)    Hgb urine dipstick SMALL (*)    Ketones, ur 5 (*)    Protein, ur 30 (*)    Leukocytes, UA TRACE (*)    Bacteria, UA RARE (*)    Squamous Epithelial / LPF 0-5 (*)    All other components within normal limits  URINE CULTURE  CULTURE, BLOOD (ROUTINE X 2)  CULTURE, BLOOD (ROUTINE X 2)  BRAIN NATRIURETIC PEPTIDE  LACTIC ACID, PLASMA  INFLUENZA PANEL BY PCR (TYPE A & B)  LACTIC ACID, PLASMA  TROPONIN I   ____________________________________________  EKG  ED ECG REPORT I, Eula Listen, the attending physician, personally viewed and interpreted this ECG.   Date: 04/29/2016  EKG Time: 850  Rate: 107  Rhythm: sinus tachycardia  Axis: leftward  Intervals:none  ST&T Change: Nonspecific T-wave inversion in V1. No ST  elevation.  ____________________________________________  RADIOLOGY  Dg Chest 2 View  Result Date: 04/29/2016 CLINICAL DATA:  Productive cough. EXAM: CHEST  2 VIEW COMPARISON:  08/31/2014 . FINDINGS: Port-A-Cath noted with lead tip at cavoatrial junction. Cardiomegaly. Mild bilateral from interstitial prominence noted consistent pneumonitis and/or interstitial edema. Small left pleural effusion. No pneumothorax. No acute bony abnormality . IMPRESSION: 1.  Port-A-Cath noted with tip at cavoatrial junction. 2. Bilateral mild interstitial prominence noted consistent with pneumonitis and/or interstitial edema. 3.  Cardiomegaly . Electronically Signed   By: Marcello Moores  Register   On: 04/29/2016 09:15    ____________________________________________   PROCEDURES  Procedure(s) performed: None  Procedures  Critical Care performed: No ____________________________________________   INITIAL IMPRESSION / ASSESSMENT AND PLAN / ED COURSE  Pertinent labs & imaging results that were available during my care of the patient were reviewed by me and considered in my medical decision making (see chart for details).  66 y.o. female with a remote history of breast cancer not currently undergoing chemotherapy presenting with cough, general malaise, myalgias, fever, nausea without vomiting, after being treated for UTI. The patient does look like she may have an infection we'll check her for influenza as well as urinary tract infection. Her chest x-ray from triage does show some bilateral interstitial prominence. I'm concerned about early pneumonia given her cough although the cough has been stable for months. New onset CHF is also possible so I have sent a BNP and a troponin. Given the patient's chest x-ray findings, I will initiate ceftriaxone and azithromycin, which should also cover urinary pathogens if she has an ongoing UTI. Influenza testing is underway.  Plan reevaluation for final  disposition.  ----------------------------------------- 9:53 AM on 04/29/2016 -----------------------------------------  Patient does have a troponin of 0.04. She is not been having active chest pain, but with her recent shortness of breath, and intermittent lower external swelling, and interstitial prominence bilaterally that may be suggestive of pulmonary edema, there may be a cardiac cause.  Given the patient aspirin, and we'll plan admitting her to the hospital.  ____________________________________________  FINAL CLINICAL IMPRESSION(S) / ED DIAGNOSES  Final diagnoses:  Sepsis, due to unspecified organism [facility])  Community acquired pneumonia, unspecified laterality  Shortness of breath  Hypokalemia  Elevated troponin  Urinary tract infection without hematuria, site unspecified         NEW MEDICATIONS STARTED DURING THIS VISIT:  New Prescriptions   No medications on file  Eula Listen, MD 04/29/16 1052

## 2016-04-29 NOTE — ED Notes (Signed)
Attempted to call report. Nurse unavailable at this time.

## 2016-04-29 NOTE — Progress Notes (Signed)
Patient w/ a h/o breast cancer, COPD who presents w/ SOB, nausea, and HA. Patient received Azithromycin 500 mg x 1 IV and Ceftriaxone 1g x 1 IV in the ED 2/20 @ 1030. Patient admitted but orders for abx were not continued. Confirmed w/ admitting hospitalist to continue abx given in the Ed.  CXR: Port-A-Cath, w/ mild bilateral interstitial prominence consistent w/ pneumonitis and/or interstitial edema. Initial WBC: 14.9  Will continue patient on Azithromycin 500 mg daily and Ceftriaxone 1g daily and will follow-up w/ resolution of signs and symptoms along with culture results.  Tobie Lords, PharmD, BCPS Clinical Pharmacist 04/29/2016

## 2016-04-30 ENCOUNTER — Observation Stay
Admit: 2016-04-30 | Discharge: 2016-04-30 | Disposition: A | Discharge status: Home / Self Care | Payer: Medicare HMO | Attending: Internal Medicine | Admitting: Internal Medicine

## 2016-04-30 ENCOUNTER — Ambulatory Visit: Admission: RE | Admit: 2016-04-30 | Payer: Medicare HMO | Source: Ambulatory Visit

## 2016-04-30 ENCOUNTER — Observation Stay: Payer: Medicare HMO

## 2016-04-30 LAB — URINE CULTURE

## 2016-04-30 LAB — BASIC METABOLIC PANEL
Anion gap: 8 (ref 5–15)
BUN: 17 mg/dL (ref 6–20)
CO2: 22 mmol/L (ref 22–32)
CREATININE: 0.76 mg/dL (ref 0.44–1.00)
Calcium: 8.2 mg/dL — ABNORMAL LOW (ref 8.9–10.3)
Chloride: 110 mmol/L (ref 101–111)
GFR calc Af Amer: >60 mL/min (ref >60)
Glucose, Bld: 211 mg/dL — ABNORMAL HIGH (ref 65–99)
Potassium: 3.6 mmol/L (ref 3.5–5.1)
SODIUM: 140 mmol/L (ref 135–145)

## 2016-04-30 LAB — CBC
HCT: 39.4 % (ref 35.0–47.0)
Hemoglobin: 13.4 g/dL (ref 12.0–16.0)
MCH: 30.4 pg (ref 26.0–34.0)
MCHC: 34 g/dL (ref 32.0–36.0)
MCV: 89.4 fL (ref 80.0–100.0)
PLATELETS: 132 10*3/uL — AB (ref 150–440)
RBC: 4.4 MIL/uL (ref 3.80–5.20)
RDW: 13.7 % (ref 11.5–14.5)
WBC: 10.7 10*3/uL (ref 3.6–11.0)

## 2016-04-30 LAB — GLUCOSE, CAPILLARY: GLUCOSE-CAPILLARY: 204 mg/dL — AB (ref 65–99)

## 2016-04-30 LAB — TROPONIN I: Troponin I: <0.03 ng/mL (ref <0.03)

## 2016-04-30 MED ORDER — IPRATROPIUM-ALBUTEROL 0.5-2.5 (3) MG/3ML IN SOLN
3.0000 mL | Freq: Two times a day (BID) | RESPIRATORY_TRACT | Status: DC
  Administered 2016-04-30 – 2016-05-01 (×2): 3 mL via RESPIRATORY_TRACT
  Filled 2016-04-30 (×2): qty 3

## 2016-04-30 MED ORDER — AZITHROMYCIN 500 MG PO TABS
500.0000 mg | ORAL_TABLET | Freq: Every day | ORAL | Status: DC
  Administered 2016-05-01: 10:00:00 500 mg via ORAL
  Filled 2016-04-30: qty 1

## 2016-04-30 MED ORDER — INSULIN ASPART 100 UNIT/ML ~~LOC~~ SOLN
0.0000 [IU] | Freq: Three times a day (TID) | SUBCUTANEOUS | Status: DC
  Administered 2016-04-30: 22:00:00 3 [IU] via SUBCUTANEOUS
  Administered 2016-05-01: 08:00:00 2 [IU] via SUBCUTANEOUS
  Filled 2016-04-30: qty 2
  Filled 2016-04-30: qty 3

## 2016-04-30 MED ORDER — IPRATROPIUM-ALBUTEROL 0.5-2.5 (3) MG/3ML IN SOLN: 3.0000 mL | RESPIRATORY_TRACT | Status: DC | PRN

## 2016-04-30 MED ORDER — FLUTICASONE PROPIONATE 50 MCG/ACT NA SUSP
2.0000 | Freq: Every day | NASAL | Status: DC
  Administered 2016-04-30: 2 via NASAL
  Filled 2016-04-30: qty 16

## 2016-04-30 MED ORDER — INSULIN ASPART 100 UNIT/ML ~~LOC~~ SOLN: 0.0000 [IU] | Freq: Three times a day (TID) | SUBCUTANEOUS | Status: DC

## 2016-04-30 NOTE — Progress Notes (Signed)
[facility] [place] NAME: Madison Christensen    MR#:  [id number]  DATE OF BIRTH:  March 08, 1951  SUBJECTIVE:  Admitted Yesterday for shortness of breath, COPD exacerbation. Patient had chest pressure this morning on the left side improved after presenting treatment, no complaints of left hand pain. No cough, no fever.   CHIEF COMPLAINT:   Chief Complaint  Patient presents with  . Shortness of Breath  . Nausea  . Headache    REVIEW OF SYSTEMS:   ROS CONSTITUTIONAL: No fever, fatigue or weakness.  EYES: No blurred or double vision.  EARS, NOSE, AND THROAT: No tinnitus or ear pain.  RESPIRATORY: No cough, shortness of breath, wheezing or hemoptysis.  CARDIOVASCULAR: No chest pain, orthopnea, edema.  GASTROINTESTINAL: No nausea, vomiting, diarrhea or abdominal pain.  GENITOURINARY: No dysuria, hematuria.  ENDOCRINE: No polyuria, nocturia,  HEMATOLOGY: No anemia, easy bruising or bleeding SKIN: No rash or lesion. MUSCULOSKELETAL: No joint pain or arthritis.   NEUROLOGIC: No tingling, numbness, weakness.  PSYCHIATRY: No anxiety or depression.   DRUG ALLERGIES:   Allergies  Allergen Reactions  . Losartan Other (See Comments)  . Sulfa Antibiotics Hives and Rash    VITALS:  Blood pressure 126/62, pulse (!) 107, temperature 97.7 F (36.5 C), temperature source Oral, resp. rate 20, height 5\' 5"  (1.651 m), weight 103.1 kg (227 lb 4.8 oz), SpO2 97 %.  PHYSICAL EXAMINATION:  GENERAL:  66 y.o.-year-old patient lying in the bed with no acute distress.  EYES: Pupils equal, round, reactive to light and accommodation. No scleral icterus. Extraocular muscles intact.  HEENT: Head atraumatic, normocephalic. Oropharynx and nasopharynx clear.  NECK:  Supple, no jugular venous distention. No thyroid enlargement, no tenderness.  LUNGS: Normal breath sounds bilaterally, no wheezing, rales,rhonchi or crepitation. No use of accessory muscles of  respiration.  CARDIOVASCULAR: S1, S2 normal. No murmurs, rubs, or gallops.  ABDOMEN: Soft, nontender, nondistended. Bowel sounds present. No organomegaly or mass.  EXTREMITIES: No pedal edema, cyanosis, or clubbing.  NEUROLOGIC: Cranial nerves II through XII are intact. Muscle strength 5/5 in all extremities. Sensation intact. Gait not checked.  PSYCHIATRIC: The patient is alert and oriented x 3.  SKIN: No obvious rash, lesion, or ulcer.    LABORATORY PANEL:   CBC  Recent Labs Lab 04/30/16 0345  WBC 10.7  HGB 13.4  HCT 39.4  PLT 132*   ------------------------------------------------------------------------------------------------------------------  Chemistries   Recent Labs Lab 04/30/16 0345  NA 140  K 3.6  CL 110  CO2 22  GLUCOSE 211*  BUN 17  CREATININE 0.76  CALCIUM 8.2*   ------------------------------------------------------------------------------------------------------------------  Cardiac Enzymes  Recent Labs Lab 04/29/16 1220  TROPONINI <0.03   ------------------------------------------------------------------------------------------------------------------  RADIOLOGY:  Dg Chest 2 View  Result Date: 04/30/2016 CLINICAL DATA:  Shortness of breath. History of breast malignancy, COPD, hypertension, remote history of smoking. EXAM: CHEST  2 VIEW COMPARISON:  PA and lateral chest x-ray of April 29, 2016 and portable chest x-ray of August 31, 2014. FINDINGS: The lungs are well-expanded. There is no focal infiltrate. The interstitial markings are mildly increased since the 2016 study but are stable since yesterday's study. There is minimal blunting of the posterior costophrenic angles bilaterally. The heart and pulmonary vascularity are normal. The mediastinum is normal in width. The porta catheter tip projects over the midportion of the SVC. There is mild multilevel degenerative disc disease of the thoracic spine. IMPRESSION: COPD. Mild stable interstitial  prominence may be related to  chronic bronchitis or acute pneumonitis type process. Small amounts of pleural fluid blunt the posterior costophrenic angles. No definite CHF. Thoracic aortic atherosclerosis. Electronically Signed   By: David  Martinique M.D.   On: 04/30/2016 07:58   Dg Chest 2 View  Result Date: 04/29/2016 CLINICAL DATA:  Productive cough. EXAM: CHEST  2 VIEW COMPARISON:  08/31/2014 . FINDINGS: Port-A-Cath noted with lead tip at cavoatrial junction. Cardiomegaly. Mild bilateral from interstitial prominence noted consistent pneumonitis and/or interstitial edema. Small left pleural effusion. No pneumothorax. No acute bony abnormality . IMPRESSION: 1.  Port-A-Cath noted with tip at cavoatrial junction. 2. Bilateral mild interstitial prominence noted consistent with pneumonitis and/or interstitial edema. 3.  Cardiomegaly . Electronically Signed   By: Marcello Moores  Register   On: 04/29/2016 09:15    EKG:   Orders placed or performed during the hospital encounter of 04/29/16  . ED EKG within 10 minutes  . ED EKG within 10 minutes  . EKG 12-Lead  . EKG 12-Lead  . EKG 12-Lead  . EKG 12-Lead    ASSESSMENT AND PLAN:   #1 chest pressure relieved with breathing treatments. Initial troponins negative. Obtain EKG, troponins 2 times,, if they are abnormal consult cardiology, order echocardiogram, 2 stage I a left breast cancer status post resection, chemotherapy, radiation. Follows up with Dr. Mike Gip., Yearly mammograms  3. Acute bronchitis: Improving, WBC normalized, continue antibiotics, steroids, lung sounds clear today. Chest x-ray did not show pneumonia.d/c iv fluids  #4. Hyperlipidemia continue statins #5 GERD continue PPIs #6 recent UTI: Treated , UA is normal on admission D/w family ,d/w RN  All the records are reviewed and case discussed with Care Management/Social Workerr. Management plans discussed with the patient, family and they are in agreement.  CODE STATUS: full  TOTAL TIME  TAKING CARE OF THIS PATIENT: 35 minutes.   POSSIBLE D/C IN 1-2DAYS, DEPENDING ON CLINICAL CONDITION.   Epifanio Lesches M.D on 04/30/2016 at 9:04 AM  Between 7am to 6pm - Pager - [phone]  After 6pm go to [email or web address] - password [email or web address] [facility]  Office  [phone]  CC: Primary care physician; Wilhemena Durie, MD   Note: This dictation was prepared with Dragon dictation along with smaller phrase technology. Any transcriptional errors that result from this process are unintentional.

## 2016-04-30 NOTE — Care Management (Signed)
Admitted to [facility] with the diagnosis of shortness of breathe under observation status. Lives with her dog. Son is Ron [phone]). Sister is Mechele Claude [phone]). Sees Dr. Iona Beard at [facility] center. Prescriptions are filled at [facility] on [address]. No Home Health. No skilled facility. No home oxygen. Shower chair in the home. Takes care of all basic and instrumental activities of daily living herself, drives. Retired from  [employer]. No falls, Decreased appetite  3 days.  Family will transport. Shelbie Ammons RN MSN CCM Care Management

## 2016-04-30 NOTE — Progress Notes (Signed)
Dr. Posey Pronto paged and notified of [place] of [phone].

## 2016-04-30 NOTE — Progress Notes (Signed)
Inpatient Diabetes Program Recommendations  AACE/ADA: New Consensus Statement on Inpatient Glycemic Control (2015)  Target Ranges:  Prepandial:   less than 140 mg/dL      Peak postprandial:   less than 180 mg/dL (1-2 hours)      Critically ill patients:  140 - 180 mg/dL   Review of Glycemic Control  Diabetes history: None Current orders for Inpatient glycemic control: None  Inpatient Diabetes Program Recommendations:   Patient on IV Solumedrol 60 mg Q12hours. Fasting lab glucose this am 211 mg/dl. Please consider CBG checks and possibly Novolog Sensitive (0-9 units) Correction TID.  Thanks,  Tama Headings RN, MSN, [facility] Inpatient Diabetes Coordinator Team Pager [phone] (8a-5p)

## 2016-04-30 NOTE — Progress Notes (Signed)
Port dsg became wet in shower, unable to reinforce.  Labs already drawn, PIV still patent.  De-accessed port, will re-access if pt does not d/c today.

## 2016-04-30 NOTE — Progress Notes (Signed)
PHARMACIST - PHYSICIAN COMMUNICATION  CONCERNING: Antibiotic IV to Oral Route Change Policy  RECOMMENDATION: This patient is receiving AZITHROMYCIN by the intravenous route.  Based on criteria approved by the Pharmacy and Therapeutics Committee, the antibiotic(s) is/are being converted to the equivalent oral dose form(s).   DESCRIPTION: These criteria include:  Patient being treated for a respiratory tract infection, urinary tract infection, cellulitis or clostridium difficile associated diarrhea if on metronidazole  The patient is not neutropenic and does not exhibit a GI malabsorption state  The patient is eating (either orally or via tube) and/or has been taking other orally administered medications for a least 24 hours  The patient is improving clinically and has a Tmax < 100.5  If you have questions about this conversion, please contact the Pharmacy Department  []   [phone] )  Madison Christensen [x]   [phone] )  [facility] []   [phone] )  Madison Christensen []   [phone] )  [facility] []   [phone] )  Joice PharmD Clinical Pharmacist 04/30/2016

## 2016-04-30 NOTE — Care Management Obs Status (Signed)
[place] NOTIFICATION   Patient Details  Name: RANNIE RUIS MRN: [id number] Date of Birth: 12/19/1950   Medicare Observation Status Notification Given:  Yes    Shelbie Ammons, RN 04/30/2016, 11:28 AM

## 2016-04-30 NOTE — Plan of Care (Addendum)
Problem: Education: Goal: Knowledge of [place] [facility] Education information/materials will improve Outcome: Progressing VSS, free of falls during shift.  Reported diaphoresis throughout shift, glucose checked early in shift- 195.  Ambulated to bathroom multiple times, independent.  Reported nausea, received scheduled PO Zofran 8mg , no PRN's needed.  Requested dulcolax, which she takes at home, last BM 2/19.  Dr. Ara Kussmaul paged, pt received ordered dulcolax 5mg .  No other complaints.  Port accessed by this RN, tolerated well.  Blood return positional, consider 1in needle in future.  Bed in low position, call bell within reach.  WCTM.

## 2016-05-01 LAB — ECHOCARDIOGRAM COMPLETE
Height: 65 in
Weight: 3636.8 oz

## 2016-05-01 LAB — GLUCOSE, CAPILLARY: GLUCOSE-CAPILLARY: 159 mg/dL — AB (ref 65–99)

## 2016-05-01 MED ORDER — AZITHROMYCIN 500 MG PO TABS
500.0000 mg | ORAL_TABLET | Freq: Every day | ORAL | 0 refills | Status: DC
Start: 2016-05-01 — End: 2016-07-22

## 2016-05-01 MED ORDER — PREDNISONE 10 MG (21) PO TBPK: ORAL_TABLET | ORAL | 0 refills | Status: DC

## 2016-05-01 MED ORDER — AMOXICILLIN-POT CLAVULANATE 875-125 MG PO TABS: 1.0000 | ORAL_TABLET | Freq: Two times a day (BID) | ORAL | 0 refills | Status: DC

## 2016-05-01 MED ORDER — ALBUTEROL SULFATE HFA 108 (90 BASE) MCG/ACT IN AERS: 2.0000 | INHALATION_SPRAY | Freq: Four times a day (QID) | RESPIRATORY_TRACT | 2 refills | Status: AC | PRN

## 2016-05-01 NOTE — Progress Notes (Signed)
Pt has been discharged home. Discharge papers given and explained to pt. Pt verbalized understanding. F/U appointments and meds reviewed with pt. RX given.

## 2016-05-04 LAB — CULTURE, BLOOD (ROUTINE X 2)
CULTURE: NO GROWTH
Culture: NO GROWTH

## 2016-05-05 NOTE — Discharge Summary (Signed)
Madison Christensen, is a 66 y.o. female  DOB [birth year]  MRN [id number].  Admission date:  04/29/2016  Admitting Physician  Dustin Flock, MD  Discharge Date:  05/01/2016   Primary MD  Sharyne Peach, MD  Recommendations for primary care physician for things to follow:  Follow up with PMD in one week Follow up with DR.Corcoran as scheduled,  Admission Diagnosis  Shortness of breath [R06.02] Hypokalemia [E87.6] Elevated troponin [R74.8] Sepsis, due to unspecified organism ([place]) [A41.9] Urinary tract infection without hematuria, site unspecified [N39.0] Community acquired pneumonia, unspecified laterality [J18.9]   Discharge Diagnosis  Shortness of breath [R06.02] Hypokalemia [E87.6] Elevated troponin [R74.8] Sepsis, due to unspecified organism ([place]) [A41.9] Urinary tract infection without hematuria, site unspecified [N39.0] Community acquired pneumonia, unspecified laterality [J18.9]   Active Problems:   SOB (shortness of breath)      Past Medical History:  Diagnosis Date  . Breast cancer ([place]) 2016   left  . Cancer ([place])    skin   . Carcinoma of left breast ([place]) 07/30/2014   1.4 cm, T1c, N0, ER negative, PR negative, HER-2 positive  . Complication of anesthesia 2004   oversensitive to noise, lights  . COPD (chronic obstructive pulmonary disease) ([place])   . Depression 1995  . GERD (gastroesophageal reflux disease)   . Hyperlipidemia   . Hypertension     Past Surgical History:  Procedure Laterality Date  . AXILLARY LYMPH NODE BIOPSY Left 08/03/2014   Procedure: AXILLARY LYMPH NODE BIOPSY;  Surgeon: Robert Bellow, MD;  Location: [facility] ORS;  Service: General;  Laterality: Left;  . BACK SURGERY    . BREAST LUMPECTOMY WITH AXILLARY LYMPH NODE DISSECTION Left 08/03/2014   Procedure: BREAST LUMPECTOMY WITH  AXILLARY LYMPH NODE DISSECTION;  Surgeon: Robert Bellow, MD;  Location: [facility] ORS;  Service: General;  Laterality: Left;  . BREAST SURGERY Left Aug 03, 2014   Wide excision, sentinel node biopsy.  . COLONOSCOPY  2013   Dr. Vira Agar  . COLONOSCOPY WITH PROPOFOL N/A 05/30/2015   Procedure: COLONOSCOPY WITH PROPOFOL;  Surgeon: Manya Silvas, MD;  Location: [facility] ENDOSCOPY;  Service: Endoscopy;  Laterality: N/A;  . PORTACATH PLACEMENT Right 08/31/2014   Procedure: INSERTION PORT-A-CATH;  Surgeon: Robert Bellow, MD;  Location: [facility] ORS;  Service: General;  Laterality: Right;  . SENTINEL NODE BIOPSY Left 08/03/2014   Procedure: SENTINEL NODE BIOPSY;  Surgeon: Robert Bellow, MD;  Location: [facility] ORS;  Service: General;  Laterality: Left;  . skin cancer  removal  2015   nose   . TONSILLECTOMY         History of present illness and  Hospital Course:     Kindly see H&P for history of present illness and admission details, please review complete Labs, Consult reports and Test reports for all details in brief  HPI  from the history and physical done on the day of admission  please see H&P for full details.admitted for nasal congestion and sob.    Hospital Course  #1. chest pressure relieved with breathing treatments. Initial troponins negative.EKG unremarkable.Echo showed EF more than 55%  2 stage I a left breast cancer status post resection, chemotherapy, radiation. Follows up with Dr. Mike Gip., Yearly mammograms  3. Acute bronchitis: Improving, WBC normalized, improved with ABX and steroids,discharged home with oral abx,,prednisone dose pack,albuterol inhalers.lung sounds clear today. Chest x-ray did not show pneumonia.  #4. Hyperlipidemia continue statins #5 GERD continue PPIs #6 recent UTI: Treated , UA is  normal on adm    Discharge Condition: stable   Follow UP  Follow-up Information    Sharyne Peach, MD. Go on 05/08/2016.   Specialty:  Family Medicine Why:   _0 :20 PM  /// [place], MED LIST (PILL BOTTLES), I.D. ARRIVE 15MIN EARLY Contact information: [place] [phone]             Discharge Instructions  and  Discharge Medications      Allergies as of 05/01/2016      Reactions   Losartan Other (See Comments)   Sulfa Antibiotics Hives, Rash      Medication List    STOP taking these medications   cephALEXin 500 MG capsule Commonly known as:  KEFLEX   chlorpheniramine-HYDROcodone 10-8 MG/5ML Suer Commonly known as:  TUSSIONEX   nitrofurantoin (macrocrystal-monohydrate) 100 MG capsule Commonly known as:  MACROBID   silver sulfADIAZINE 1 % cream Commonly known as:  SILVADENE     TAKE these medications   albuterol 108 (90 Base) MCG/ACT inhaler Commonly known as:  PROVENTIL HFA;VENTOLIN HFA Inhale 2 puffs into the lungs every 6 (six) hours as needed for wheezing or shortness of breath.   amoxicillin-clavulanate 875-125 MG tablet Commonly known as:  AUGMENTIN Take 1 tablet by mouth 2 (two) times daily.   aspirin EC 81 MG tablet Take by mouth.   azithromycin 500 MG tablet Commonly known as:  ZITHROMAX Take 1 tablet (500 mg total) by mouth daily.   citalopram 20 MG tablet Commonly known as:  CELEXA TAKE 1 TABLET (20 MG TOTAL) BY MOUTH DAILY.   colestipol 1 g tablet Commonly known as:  COLESTID TAKE 2 TABLETS (2 G TOTAL) BY MOUTH 2 (TWO) TIMES DAILY.   fluticasone 50 MCG/ACT nasal spray Commonly known as:  FLONASE Place 2 sprays into the nose daily.   gabapentin 100 MG capsule Commonly known as:  NEURONTIN TAKE 1 CAPSULE (100 MG TOTAL) BY MOUTH 3 (THREE) TIMES DAILY. What changed:  how much to take  how to take this  when to take this  additional instructions   HYDROcodone-acetaminophen 5-325 MG tablet Commonly known as:  NORCO Take 1-2 tablets by mouth every 4 (four) hours as needed for moderate pain or severe pain.   lidocaine-prilocaine  cream Commonly known as:  EMLA Apply 1 application topically as needed.   ondansetron 8 MG tablet Commonly known as:  ZOFRAN Take 1 tablet (8 mg total) by mouth 2 (two) times daily. Start the day after chemo for 2 days. Then take as needed for nausea or vomiting.   pravastatin 40 MG tablet Commonly known as:  PRAVACHOL Take 40 mg by mouth every morning.   predniSONE 10 MG (21) Tbpk tablet Commonly known as:  STERAPRED UNI-PAK 21 TAB Taper by 10 mg daily Take as directed,   spironolactone 25 MG tablet Commonly known as:  ALDACTONE Take 25 mg by mouth 2 (two) times daily.   triamterene-hydrochlorothiazide 75-50 MG tablet Commonly known as:  MAXZIDE Take 1 tablet by mouth every morning.   Vitamin D (Ergocalciferol) 50000 units Caps capsule Commonly known as:  DRISDOL Take 50,000 Units by mouth once a week. Mondays         Diet and Activity recommendation: See Discharge Instructions above   Consults obtained -none   Major procedures and Radiology Reports - PLEASE review detailed and final reports for all details, in brief -      Dg Chest 2 View  Result Date:  04/30/2016 CLINICAL DATA:  Shortness of breath. History of breast malignancy, COPD, hypertension, remote history of smoking. EXAM: CHEST  2 VIEW COMPARISON:  PA and lateral chest x-ray of April 29, 2016 and portable chest x-ray of August 31, 2014. FINDINGS: The lungs are well-expanded. There is no focal infiltrate. The interstitial markings are mildly increased since the 2016 study but are stable since yesterday's study. There is minimal blunting of the posterior costophrenic angles bilaterally. The heart and pulmonary vascularity are normal. The mediastinum is normal in width. The porta catheter tip projects over the midportion of the SVC. There is mild multilevel degenerative disc disease of the thoracic spine. IMPRESSION: COPD. Mild stable interstitial prominence may be related to chronic bronchitis or acute  pneumonitis type process. Small amounts of pleural fluid blunt the posterior costophrenic angles. No definite CHF. Thoracic aortic atherosclerosis. Electronically Signed   By: David  Martinique M.D.   On: 04/30/2016 07:58   Dg Chest 2 View  Result Date: 04/29/2016 CLINICAL DATA:  Productive cough. EXAM: CHEST  2 VIEW COMPARISON:  08/31/2014 . FINDINGS: Port-A-Cath noted with lead tip at cavoatrial junction. Cardiomegaly. Mild bilateral from interstitial prominence noted consistent pneumonitis and/or interstitial edema. Small left pleural effusion. No pneumothorax. No acute bony abnormality . IMPRESSION: 1.  Port-A-Cath noted with tip at cavoatrial junction. 2. Bilateral mild interstitial prominence noted consistent with pneumonitis and/or interstitial edema. 3.  Cardiomegaly . Electronically Signed   By: Marcello Moores  Register   On: 04/29/2016 09:15    Micro Results    Recent Results (from the past 240 hour(s))  Urine culture     Status: Abnormal   Collection Time: 04/29/16 10:01 AM  Result Value Ref Range Status   Specimen Description URINE, RANDOM  Final   Special Requests NONE  Final   Culture (A)  Final    <10,000 COLONIES/mL INSIGNIFICANT GROWTH Performed at [facility], [address], [place], [place] [address]    Report Status 04/30/2016 FINAL  Final  Blood culture (routine x 2)     Status: None   Collection Time: 04/29/16 10:02 AM  Result Value Ref Range Status   Specimen Description BLOOD RIGHT AC  Final   Special Requests BOTTLES DRAWN AEROBIC AND ANAEROBIC [employer]  Final   Culture NO GROWTH 5 DAYS  Final   Report Status 05/04/2016 FINAL  Final  Blood culture (routine x 2)     Status: None   Collection Time: 04/29/16 10:02 AM  Result Value Ref Range Status   Specimen Description BLOOD  RIGHT FORE ARM  Final   Special Requests BOTTLES DRAWN AEROBIC AND ANAEROBIC  [place]  Final   Culture NO GROWTH 5 DAYS  Final   Report Status 05/04/2016 FINAL  Final       Today    Subjective:   Madison Christensen today has no headache,no chest abdominal pain,no new weakness tingling or numbness, feels much better wants to go home today.  Objective:   Blood pressure 131/63, pulse 76, temperature 97.5 F (36.4 C), temperature source Oral, resp. rate 19, height _0  (1.651 m), weight 103.1 kg (227 lb 4.8 oz), SpO2 94 %.  No intake or output data in the 24 hours ending 05/05/16 0855  Exam Awake Alert, Oriented x 3, No new F.N deficits, Normal affect [place].AT,PERRAL Supple Neck,No JVD, No cervical lymphadenopathy appriciated.  Symmetrical Chest wall movement, Good air movement bilaterally, CTAB RRR,No Gallops,Rubs or new Murmurs, No Parasternal Heave +ve B.Sounds, Abd Soft, Non tender, No  organomegaly appriciated, No rebound -guarding or rigidity. No Cyanosis, Clubbing or edema, No new Rash or bruise  Data Review   CBC w Diff:  Lab Results  Component Value Date   WBC 10.7 04/30/2016   HGB 13.4 04/30/2016   HCT 39.4 04/30/2016   PLT 132 (L) 04/30/2016   LYMPHOPCT 44 03/11/2016   MONOPCT 8 03/11/2016   EOSPCT 2 03/11/2016   BASOPCT 1 03/11/2016    CMP:  Lab Results  Component Value Date   NA 140 04/30/2016   K 3.6 04/30/2016   CL 110 04/30/2016   CO2 22 04/30/2016   BUN 17 04/30/2016   CREATININE 0.76 04/30/2016   PROT 7.2 03/11/2016   ALBUMIN 4.1 03/11/2016   BILITOT 1.5 (H) 03/11/2016   ALKPHOS 48 03/11/2016   AST 26 03/11/2016   ALT 23 03/11/2016  .   Total Time in preparing paper work, data evaluation and todays exam - 39 minutes  Brycin Kille M.D on 05/01/2016 at 8:55 AM    Note: This dictation was prepared with Dragon dictation along with smaller phrase technology. Any transcriptional errors that result from this process are unintentional.

## 2016-05-07 ENCOUNTER — Other Ambulatory Visit: Payer: Self-pay | Admitting: Oncology

## 2016-05-10 ENCOUNTER — Encounter: Payer: Self-pay | Admitting: Hematology and Oncology

## 2016-05-12 ENCOUNTER — Ambulatory Visit
Admission: RE | Admit: 2016-05-12 | Discharge: 2016-05-12 | Disposition: A | Discharge status: Home / Self Care | Payer: Medicare HMO | Source: Ambulatory Visit | Attending: Hematology and Oncology | Admitting: Hematology and Oncology

## 2016-05-12 DIAGNOSIS — Z171 Estrogen receptor negative status [ER-]: Secondary | ICD-10-CM | POA: Diagnosis not present

## 2016-05-12 DIAGNOSIS — M85851 Other specified disorders of bone density and structure, right thigh: Secondary | ICD-10-CM | POA: Insufficient documentation

## 2016-05-12 DIAGNOSIS — C50212 Malignant neoplasm of upper-inner quadrant of left female breast: Secondary | ICD-10-CM | POA: Diagnosis not present

## 2016-05-13 ENCOUNTER — Inpatient Hospital Stay: Payer: Self-pay | Admitting: Family Medicine

## 2016-06-03 ENCOUNTER — Inpatient Hospital Stay: Payer: Medicare HMO | Attending: Hematology and Oncology

## 2016-06-03 DIAGNOSIS — Z452 Encounter for adjustment and management of vascular access device: Secondary | ICD-10-CM | POA: Insufficient documentation

## 2016-06-03 DIAGNOSIS — Z95828 Presence of other vascular implants and grafts: Secondary | ICD-10-CM

## 2016-06-03 DIAGNOSIS — C50212 Malignant neoplasm of upper-inner quadrant of left female breast: Secondary | ICD-10-CM | POA: Insufficient documentation

## 2016-06-03 DIAGNOSIS — Z170 Estrogen receptor positive status [ER+]: Secondary | ICD-10-CM | POA: Insufficient documentation

## 2016-06-03 MED ORDER — SODIUM CHLORIDE 0.9% FLUSH
10.0000 mL | INTRAVENOUS | Status: DC | PRN
  Administered 2016-06-03: 10 mL via INTRAVENOUS
  Filled 2016-06-03: qty 10

## 2016-06-03 MED ORDER — HEPARIN SOD (PORK) LOCK FLUSH 100 UNIT/ML IV SOLN
500.0000 [IU] | Freq: Once | INTRAVENOUS | Status: AC
  Administered 2016-06-03: 500 [IU] via INTRAVENOUS

## 2016-07-14 ENCOUNTER — Other Ambulatory Visit: Payer: Self-pay | Admitting: Oncology

## 2016-07-15 ENCOUNTER — Inpatient Hospital Stay: Payer: Medicare HMO

## 2016-07-21 ENCOUNTER — Other Ambulatory Visit: Payer: Medicare HMO

## 2016-07-22 ENCOUNTER — Other Ambulatory Visit: Payer: Self-pay | Admitting: *Deleted

## 2016-07-22 ENCOUNTER — Encounter: Payer: Self-pay | Admitting: Hematology and Oncology

## 2016-07-22 ENCOUNTER — Ambulatory Visit
Admission: RE | Admit: 2016-07-22 | Discharge: 2016-07-22 | Disposition: A | Discharge status: Home / Self Care | Payer: Medicare HMO | Source: Ambulatory Visit | Attending: Hematology and Oncology | Admitting: Hematology and Oncology

## 2016-07-22 ENCOUNTER — Inpatient Hospital Stay: Payer: Medicare HMO | Attending: Hematology and Oncology

## 2016-07-22 ENCOUNTER — Other Ambulatory Visit: Payer: Self-pay

## 2016-07-22 ENCOUNTER — Inpatient Hospital Stay: Payer: Medicare HMO | Attending: Hematology and Oncology | Admitting: Hematology and Oncology

## 2016-07-22 VITALS — BP 148/83 | HR 73 | Wt 232.5 lb

## 2016-07-22 DIAGNOSIS — E785 Hyperlipidemia, unspecified: Secondary | ICD-10-CM | POA: Diagnosis not present

## 2016-07-22 DIAGNOSIS — Z171 Estrogen receptor negative status [ER-]: Secondary | ICD-10-CM | POA: Diagnosis not present

## 2016-07-22 DIAGNOSIS — C50912 Malignant neoplasm of unspecified site of left female breast: Secondary | ICD-10-CM

## 2016-07-22 DIAGNOSIS — F329 Major depressive disorder, single episode, unspecified: Secondary | ICD-10-CM | POA: Diagnosis not present

## 2016-07-22 DIAGNOSIS — I10 Essential (primary) hypertension: Secondary | ICD-10-CM | POA: Insufficient documentation

## 2016-07-22 DIAGNOSIS — C50212 Malignant neoplasm of upper-inner quadrant of left female breast: Secondary | ICD-10-CM

## 2016-07-22 DIAGNOSIS — K219 Gastro-esophageal reflux disease without esophagitis: Secondary | ICD-10-CM | POA: Diagnosis not present

## 2016-07-22 DIAGNOSIS — G629 Polyneuropathy, unspecified: Secondary | ICD-10-CM | POA: Diagnosis not present

## 2016-07-22 DIAGNOSIS — Z87891 Personal history of nicotine dependence: Secondary | ICD-10-CM

## 2016-07-22 DIAGNOSIS — Z853 Personal history of malignant neoplasm of breast: Secondary | ICD-10-CM | POA: Insufficient documentation

## 2016-07-22 DIAGNOSIS — Z85828 Personal history of other malignant neoplasm of skin: Secondary | ICD-10-CM | POA: Insufficient documentation

## 2016-07-22 DIAGNOSIS — Z8619 Personal history of other infectious and parasitic diseases: Secondary | ICD-10-CM

## 2016-07-22 DIAGNOSIS — J449 Chronic obstructive pulmonary disease, unspecified: Secondary | ICD-10-CM | POA: Diagnosis not present

## 2016-07-22 DIAGNOSIS — R17 Unspecified jaundice: Secondary | ICD-10-CM | POA: Insufficient documentation

## 2016-07-22 DIAGNOSIS — Z7982 Long term (current) use of aspirin: Secondary | ICD-10-CM

## 2016-07-22 DIAGNOSIS — Z923 Personal history of irradiation: Secondary | ICD-10-CM | POA: Insufficient documentation

## 2016-07-22 DIAGNOSIS — M8580 Other specified disorders of bone density and structure, unspecified site: Secondary | ICD-10-CM | POA: Insufficient documentation

## 2016-07-22 DIAGNOSIS — Z79899 Other long term (current) drug therapy: Secondary | ICD-10-CM | POA: Diagnosis not present

## 2016-07-22 LAB — COMPREHENSIVE METABOLIC PANEL
ALT: 25 U/L (ref 14–54)
AST: 26 U/L (ref 15–41)
Albumin: 4.2 g/dL (ref 3.5–5.0)
Alkaline Phosphatase: 52 U/L (ref 38–126)
Anion gap: 4 — ABNORMAL LOW (ref 5–15)
BUN: 13 mg/dL (ref 6–20)
CO2: 27 mmol/L (ref 22–32)
Calcium: 9.5 mg/dL (ref 8.9–10.3)
Chloride: 106 mmol/L (ref 101–111)
Creatinine, Ser: 1.01 mg/dL — ABNORMAL HIGH (ref 0.44–1.00)
GFR calc Af Amer: >60 mL/min (ref >60)
GFR calc non Af Amer: 57 mL/min — ABNORMAL LOW (ref >60)
Glucose, Bld: 113 mg/dL — ABNORMAL HIGH (ref 65–99)
Potassium: 3.7 mmol/L (ref 3.5–5.1)
Sodium: 137 mmol/L (ref 135–145)
Total Bilirubin: 1.5 mg/dL — ABNORMAL HIGH (ref 0.3–1.2)
Total Protein: 7.2 g/dL (ref 6.5–8.1)

## 2016-07-22 LAB — CBC WITH DIFFERENTIAL/PLATELET
Basophils Absolute: 0.1 10*3/uL (ref 0–0.1)
Basophils Relative: 1 %
Eosinophils Absolute: 0.1 10*3/uL (ref 0–0.7)
Eosinophils Relative: 1 %
HCT: 42.3 % (ref 35.0–47.0)
Hemoglobin: 14.8 g/dL (ref 12.0–16.0)
Lymphocytes Relative: 38 %
Lymphs Abs: 2.8 10*3/uL (ref 1.0–3.6)
MCH: 30.5 pg (ref 26.0–34.0)
MCHC: 34.9 g/dL (ref 32.0–36.0)
MCV: 87.5 fL (ref 80.0–100.0)
Monocytes Absolute: 0.5 10*3/uL (ref 0.2–0.9)
Monocytes Relative: 7 %
Neutro Abs: 3.9 10*3/uL (ref 1.4–6.5)
Neutrophils Relative %: 53 %
Platelets: 165 10*3/uL (ref 150–440)
RBC: 4.84 MIL/uL (ref 3.80–5.20)
RDW: 13.1 % (ref 11.5–14.5)
WBC: 7.5 10*3/uL (ref 3.6–11.0)

## 2016-07-22 LAB — BILIRUBIN, DIRECT: Bilirubin, Direct: 0.2 mg/dL (ref 0.1–0.5)

## 2016-07-22 MED ORDER — SODIUM CHLORIDE 0.9% FLUSH
10.0000 mL | INTRAVENOUS | Status: AC | PRN
Start: 2016-07-22 — End: ?
  Administered 2016-07-22: 10 mL via INTRAVENOUS
  Filled 2016-07-22: qty 10

## 2016-07-22 MED ORDER — HEPARIN SOD (PORK) LOCK FLUSH 100 UNIT/ML IV SOLN
500.0000 [IU] | Freq: Once | INTRAVENOUS | Status: AC
  Administered 2016-07-22: 500 [IU] via INTRAVENOUS

## 2016-07-22 NOTE — Progress Notes (Signed)
[facility] day:  07/22/2016  Chief Complaint: IRIDIAN READER is a 66 y.o. female with stage IA Her2/neu + left breast cancer who is seen for 4 month assessment.  HPI:  The patient was last seen in the medical oncology clinic on 03/11/2016.  At that time, she was seen for initial assessment.  She was on Neurontin for a mild residual neuropathy.  Exam was unremarkable.  Labs were normal including a CA27.29.  Bone density study on 05/12/2016 revealed osteopenia with a T score of -1.1 in the right femur and -0.4 in the AP spine L1-L2.  She was admitted to [facility] from 04/29/2016 - 05/01/2016 with shortness of breath.  She was diagnosed with sepsis, UTI, and bronchitis.    Bilateral mammogram on 07/22/2016 revealed scattered areas of fibroglandular density.  Post surgical changes were noted in the left breast.    Symptomatically, she denies any breast concerns.  She is now on inhalers.     Past Medical History:  Diagnosis Date  . Breast cancer ([place]) 2016   left  . Cancer ([place])    skin   . Carcinoma of left breast ([place]) 07/30/2014   1.4 cm, T1c, N0, ER negative, PR negative, HER-2 positive  . Complication of anesthesia 2004   oversensitive to noise, lights  . COPD (chronic obstructive pulmonary disease) ([place])   . Depression 1995  . GERD (gastroesophageal reflux disease)   . Hyperlipidemia   . Hypertension     Past Surgical History:  Procedure Laterality Date  . AXILLARY LYMPH NODE BIOPSY Left 08/03/2014   Procedure: AXILLARY LYMPH NODE BIOPSY;  Surgeon: Robert Bellow, MD;  Location: [facility] ORS;  Service: General;  Laterality: Left;  . BACK SURGERY    . BREAST BIOPSY Left   . BREAST LUMPECTOMY Left 2016  . BREAST LUMPECTOMY WITH AXILLARY LYMPH NODE DISSECTION Left 08/03/2014   Procedure: BREAST LUMPECTOMY WITH AXILLARY LYMPH NODE DISSECTION;  Surgeon: Robert Bellow, MD;  Location: [facility] ORS;  Service: General;  Laterality: Left;  .  BREAST SURGERY Left Aug 03, 2014   Wide excision, sentinel node biopsy.  . COLONOSCOPY  2013   Dr. Vira Agar  . COLONOSCOPY WITH PROPOFOL N/A 05/30/2015   Procedure: COLONOSCOPY WITH PROPOFOL;  Surgeon: Manya Silvas, MD;  Location: [facility] ENDOSCOPY;  Service: Endoscopy;  Laterality: N/A;  . PORTACATH PLACEMENT Right 08/31/2014   Procedure: INSERTION PORT-A-CATH;  Surgeon: Robert Bellow, MD;  Location: [facility] ORS;  Service: General;  Laterality: Right;  . REDUCTION MAMMAPLASTY Bilateral   . SENTINEL NODE BIOPSY Left 08/03/2014   Procedure: SENTINEL NODE BIOPSY;  Surgeon: Robert Bellow, MD;  Location: [facility] ORS;  Service: General;  Laterality: Left;  . skin cancer  removal  2015   nose   . TONSILLECTOMY      Family History  Problem Relation Age of Onset  . Breast cancer Mother 73          . Breast cancer Cousin 62            Social History:  reports that she quit smoking about 40 years ago. Her smoking use included Cigarettes. She has a 2.00 pack-year smoking history. She has never used smokeless tobacco. She reports that she drinks alcohol. She reports that she does not use drugs.  She is retired from [employer].  She lives in [place].  The patient is accompanied by her sister, Arville Go, today.  Allergies:  Allergies  Allergen Reactions  . Losartan Other (See Comments)  . Sulfa Antibiotics Hives and Rash    Current Medications: Current Outpatient Prescriptions  Medication Sig Dispense Refill  . albuterol (PROVENTIL HFA;VENTOLIN HFA) 108 (90 Base) MCG/ACT inhaler Inhale 2 puffs into the lungs every 6 (six) hours as needed for wheezing or shortness of breath. 1 Inhaler 2  . aspirin EC 81 MG tablet Take by mouth.    . cholecalciferol (VITAMIN D) 1000 units tablet Take 1,000 Units by mouth daily.    . citalopram (CELEXA) 20 MG tablet TAKE 1 TABLET (20 MG TOTAL) BY MOUTH DAILY. 30 tablet 5  . colesevelam (WELCHOL) 625 MG tablet Take by mouth.    . colestipol (COLESTID) 1 g tablet  TAKE 2 TABLETS (2 G TOTAL) BY MOUTH 2 (TWO) TIMES DAILY.  1  . fluticasone (FLONASE) 50 MCG/ACT nasal spray Place 2 sprays into the nose daily.    Marland Kitchen gabapentin (NEURONTIN) 100 MG capsule TAKE 1 CAPSULE BY MOUTH THREE TIMES DAILY 90 capsule 1  . Ipratropium-Albuterol (COMBIVENT) 20-100 MCG/ACT AERS respimat Inhale into the lungs.    . lidocaine-prilocaine (EMLA) cream Apply 1 application topically as needed. 30 g 1  . Multiple Vitamin (MULTIVITAMIN) tablet Take 1 tablet by mouth daily.    . pravastatin (PRAVACHOL) 40 MG tablet Take 40 mg by mouth every morning.   3  . spironolactone (ALDACTONE) 25 MG tablet Take 25 mg by mouth 2 (two) times daily.     Marland Kitchen triamterene-hydrochlorothiazide (MAXZIDE) 75-50 MG per tablet Take 1 tablet by mouth every morning.   4   No current facility-administered medications for this visit.    Facility-Administered Medications Ordered in Other Visits  Medication Dose Route Frequency Provider Last Rate Last Dose  . 0.9 %  sodium chloride infusion   Intravenous Continuous Manya Silvas, MD      . diphenhydrAMINE (BENADRYL) capsule 25 mg  25 mg Oral Once Choksi, Delorise Shiner, MD      . sodium chloride 0.9 % injection 10 mL  10 mL Intravenous PRN Forest Gleason, MD   10 mL at 09/25/14 1418  . sodium chloride 0.9 % injection 10 mL  10 mL Intravenous PRN Forest Gleason, MD   10 mL at 01/15/15 1325  . sodium chloride flush (NS) 0.9 % injection 10 mL  10 mL Intravenous PRN Lequita Asal, MD   10 mL at 07/22/16 1011    Review of Systems:  GENERAL:  Feels "ok". No fevers or sweats.  Weight down 3 pounds. PERFORMANCE STATUS (ECOG): 1 HEENT:  No visual changes, runny nose, sore throat, mouth sores or tenderness. Lungs: Shortness of breath with exertion (see HPI).  No cough.  No hemoptysis. Cardiac:  No chest pain, palpitations, orthopnea, or PND. GI: No nausea, vomiting, diarrhea, constipation, melena or hematochezia.  Colonoscopy 05/30/2015. GU:  No urgency, frequency,  dysuria, or hematuria. Musculoskeletal:  No back pain.  No joint pain.  No muscle tenderness. Extremities:  No pain or swelling. Skin:  No rashes or skin changes. Neuro:  Neuropathy in fingers and toes (once in awhile).  No headache, weakness, balance or coordination issues. Endocrine:  No diabetes, thyroid issues, hot flashes or night sweats. Psych:  No mood changes, depression or anxiety. Pain:  No focal pain. Review of systems:  All other systems reviewed and found to be negative.  Physical Exam: Blood pressure (!) 148/83, pulse 73, weight 232 lb 8 oz (105.5 kg). GENERAL:  Well developed, well nourished, woman sitting  comfortably in the exam room in no acute distress. MENTAL STATUS:  Alert and oriented to person, place and time. HEAD:  Short styled silver hair.  Normocephalic, atraumatic, face symmetric, no Cushingoid features. EYES:  Glasses.  Blue eyes.  Pupils equal round and reactive to light and accomodation.  No conjunctivitis or scleral icterus. ENT:  Oropharynx clear without lesion.  Tongue normal. Mucous membranes moist.  RESPIRATORY:  Clear to auscultation without rales, wheezes or rhonchi. CARDIOVASCULAR:  Regular rate and rhythm without murmur, rub or gallop. BREAST:  Right breast without masses, skin changes or nipple discharge.  Left breast with a 4 cm area of post-operative and post radiation changes at 12 o'clock.  Mild inferior breast edema.  No masses, skin changes or nipple discharge.  ABDOMEN:  Soft, non-tender, with active bowel sounds, and no hepatosplenomegaly.  No masses. SKIN:  No rashes, ulcers or lesions. EXTREMITIES: No edema, no skin discoloration or tenderness.  No palpable cords. LYMPH NODES: No palpable cervical, supraclavicular, axillary or inguinal adenopathy  PSYCH:  Appropriate.   Orders Only on 07/22/2016  Component Date Value Ref Range Status  . Bilirubin, Direct 07/22/2016 0.2  0.1 - 0.5 mg/dL Final  Infusion on 07/22/2016  Component Date Value  Ref Range Status  . CA 27.29 07/22/2016 9.8  0.0 - 38.6 U/mL Final   Comment: (NOTE) [facility]/ACS methodology Performed At: [facility] [place], [place] [id number] Lindon Romp MD [id number]   . WBC 07/22/2016 7.5  3.6 - 11.0 K/uL Final  . RBC 07/22/2016 4.84  3.80 - 5.20 MIL/uL Final  . Hemoglobin 07/22/2016 14.8  12.0 - 16.0 g/dL Final  . HCT 07/22/2016 42.3  35.0 - 47.0 % Final  . MCV 07/22/2016 87.5  80.0 - 100.0 fL Final  . MCH 07/22/2016 30.5  26.0 - 34.0 pg Final  . MCHC 07/22/2016 34.9  32.0 - 36.0 g/dL Final  . RDW 07/22/2016 13.1  11.5 - 14.5 % Final  . Platelets 07/22/2016 165  150 - 440 K/uL Final  . Neutrophils Relative % 07/22/2016 53  % Final  . Neutro Abs 07/22/2016 3.9  1.4 - 6.5 K/uL Final  . Lymphocytes Relative 07/22/2016 38  % Final  . Lymphs Abs 07/22/2016 2.8  1.0 - 3.6 K/uL Final  . Monocytes Relative 07/22/2016 7  % Final  . Monocytes Absolute 07/22/2016 0.5  0.2 - 0.9 K/uL Final  . Eosinophils Relative 07/22/2016 1  % Final  . Eosinophils Absolute 07/22/2016 0.1  0 - 0.7 K/uL Final  . Basophils Relative 07/22/2016 1  % Final  . Basophils Absolute 07/22/2016 0.1  0 - 0.1 K/uL Final  . Sodium 07/22/2016 137  135 - 145 mmol/L Final  . Potassium 07/22/2016 3.7  3.5 - 5.1 mmol/L Final  . Chloride 07/22/2016 106  101 - 111 mmol/L Final  . CO2 07/22/2016 27  22 - 32 mmol/L Final  . Glucose, Bld 07/22/2016 113* 65 - 99 mg/dL Final  . BUN 07/22/2016 13  6 - 20 mg/dL Final  . Creatinine, Ser 07/22/2016 1.01* 0.44 - 1.00 mg/dL Final  . Calcium 07/22/2016 9.5  8.9 - 10.3 mg/dL Final  . Total Protein 07/22/2016 7.2  6.5 - 8.1 g/dL Final  . Albumin 07/22/2016 4.2  3.5 - 5.0 g/dL Final  . AST 07/22/2016 26  15 - 41 U/L Final  . ALT 07/22/2016 25  14 - 54 U/L Final  . Alkaline Phosphatase 07/22/2016 52  38 - 126  U/L Final  . Total Bilirubin 07/22/2016 1.5* 0.3 - 1.2 mg/dL Final  . GFR calc non Af Amer 07/22/2016 57* >60 mL/min  Final  . GFR calc Af Amer 07/22/2016 >60  >60 mL/min Final   Comment: (NOTE) The eGFR has been calculated using the CKD EPI equation. This calculation has not been validated in all clinical situations. eGFR's persistently <60 mL/min signify possible Chronic Kidney Disease.   Georgiann Hahn gap 07/22/2016 4* 5 - 15 Final    Assessment:  GLAYDS INSCO is a 66 y.o. female with stage IA Her2/neu + left breast cancer status wide excision on  08/03/2014.  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  Lymphvascular invasion was indeterminate (+ on original biopsy).  DCIS was present.  Three sentinel lymph nodes were negative.  Tumor was ER -, PR -, and Her2/neu 3+ by IHC.  Pathologic stage was [id number].  She received adjuvant Taxol and Herceptin.  She received 10 of 12 weeks of Taxol (09/04/2014 - 11/06/2014).  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  She received Herceptin x 6 months (09/04/2014 - 03/27/2015).  Herceptin was discontinued secondary to myalgias.  She completed radiation therapy in 02/2015.  Bilateral mammogram on 07/18/2015 revealed no evidence of malignancy.  Bilateral mammogram on 07/22/2016 was benign.  There were scattered areas of fibroglandular density.  Post surgical changes were noted in the left breast.   CA27.29 has been followed: 16.3 on 03/11/2016 and 9.8 on 07/22/2016.  Bone density study on 05/12/2016 revealed osteopenia with a T score of -1.1 in the right femur and -0.4 in the AP spine L1-L2.  Symptomatically, she denies any concerns.  Exam is stable.  Bilirubin is 1.5 (direct 0.2).  Plan: 1.  Labs today:  CBC with diff, CMP, direct bilirubin, CA27.29. 2.  Review interval bone density study.  Osteopenia.  Discuss calcium and vitamin D. 3.  Review mammogram today- no evidence of malignancy. 4.  Port flush every 6-8 weeks 5.  RTC in 4 months for MD assessment and labs (CBC with diff, CMP, CA27.29)   Lequita Asal, MD   07/22/2016, 10:54 AM

## 2016-07-23 LAB — CANCER ANTIGEN 27.29: CA 27.29: 9.8 U/mL (ref 0.0–38.6)

## 2016-09-02 ENCOUNTER — Inpatient Hospital Stay: Payer: Medicare HMO | Attending: Hematology and Oncology

## 2016-09-09 ENCOUNTER — Inpatient Hospital Stay: Payer: Medicare HMO | Attending: Hematology and Oncology

## 2016-09-09 DIAGNOSIS — Z452 Encounter for adjustment and management of vascular access device: Secondary | ICD-10-CM | POA: Diagnosis not present

## 2016-09-09 DIAGNOSIS — Z853 Personal history of malignant neoplasm of breast: Secondary | ICD-10-CM | POA: Diagnosis not present

## 2016-09-09 DIAGNOSIS — Z171 Estrogen receptor negative status [ER-]: Secondary | ICD-10-CM

## 2016-09-09 DIAGNOSIS — C50212 Malignant neoplasm of upper-inner quadrant of left female breast: Secondary | ICD-10-CM

## 2016-09-09 MED ORDER — SODIUM CHLORIDE 0.9% FLUSH
10.0000 mL | INTRAVENOUS | Status: DC | PRN
  Administered 2016-09-09: 10 mL via INTRAVENOUS
  Filled 2016-09-09: qty 10

## 2016-09-09 MED ORDER — HEPARIN SOD (PORK) LOCK FLUSH 100 UNIT/ML IV SOLN
500.0000 [IU] | Freq: Once | INTRAVENOUS | Status: AC
  Administered 2016-09-09: 500 [IU] via INTRAVENOUS

## 2016-10-14 ENCOUNTER — Inpatient Hospital Stay: Payer: Medicare HMO | Attending: Hematology and Oncology

## 2016-11-02 IMAGING — US US BIOPSY BREAST CORE W/ IMAGING
1 series · 8 of 8 positions shown · non-contrast
Comparison: Previous exam(s).

ADDENDUM:
Final pathology demonstrates INVASIVE DUCTAL CARCINOMA WITH
LYMPHOVASCULAR INVASION..

Histology correlates with imaging findings.
These results were relayed to the patient by Yehison Vaca.
Recommend surgery/oncology consultation which will be arranged by
the patient's primary provider..
CLINICAL DATA: 63-year-old female for biopsy of an indeterminate
left breast mass
EXAM:
ULTRASOUND GUIDED LEFT BREAST CORE NEEDLE BIOPSY

[Series 1: us biopsy breast core w/ imaging · 0.08mm/px · 8 of 8 slices shown]
[im 1/8]
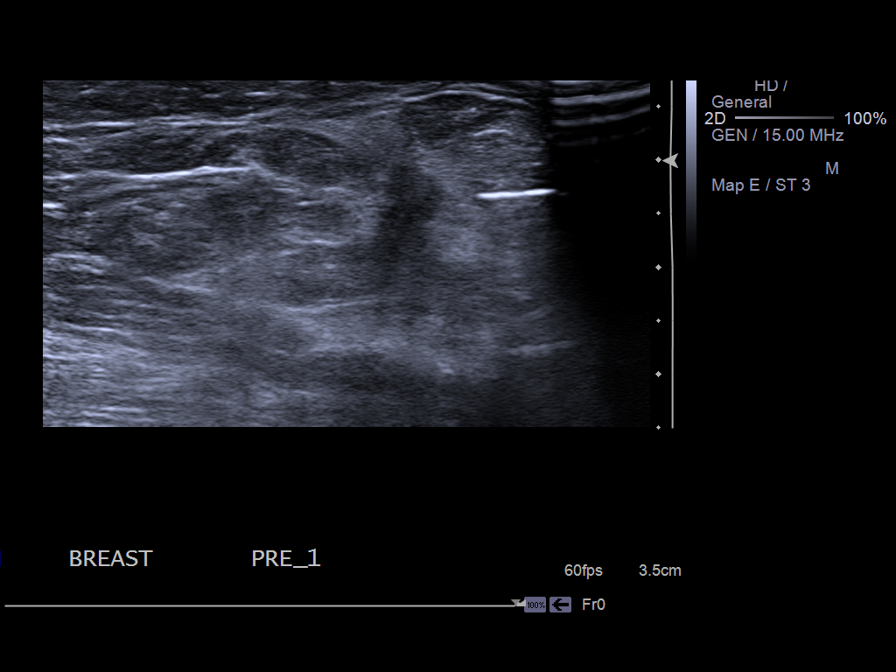
[im 2/8]
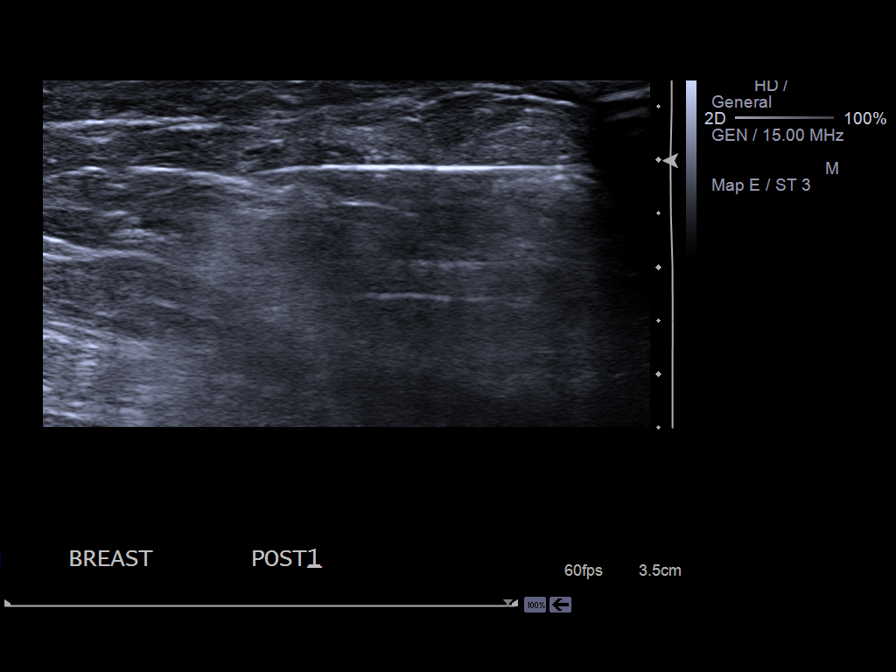
[im 3/8]
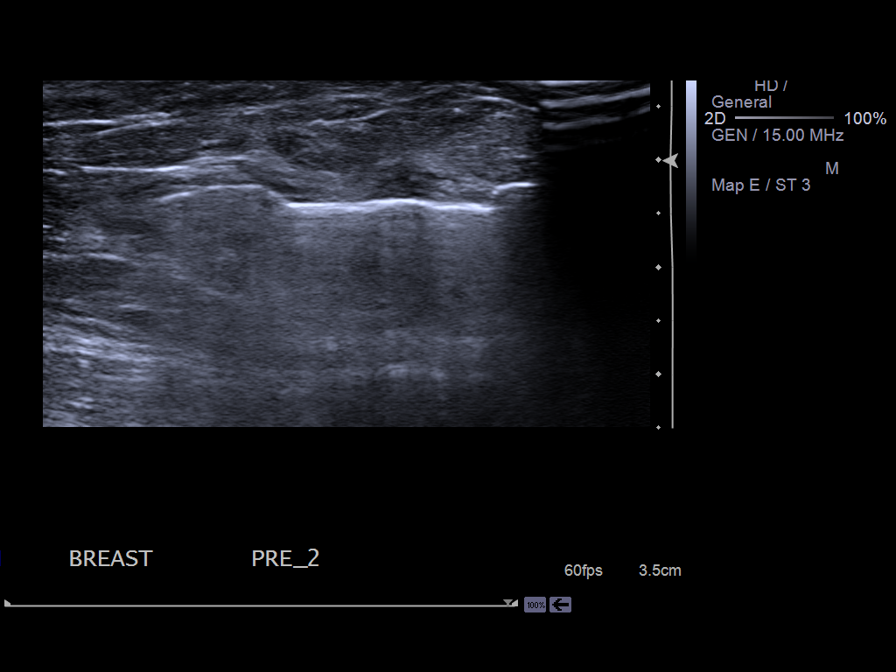
[im 4/8]
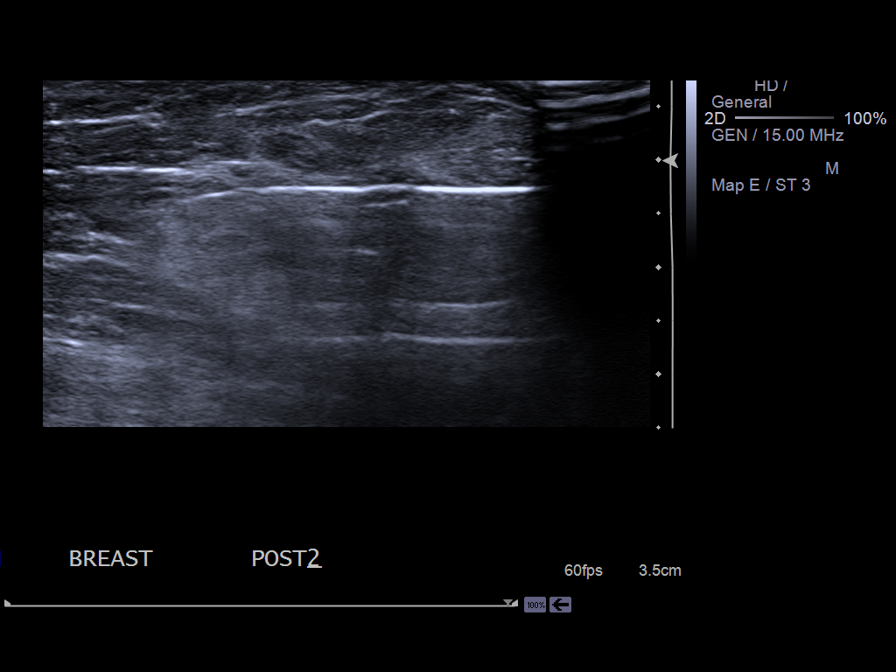
[im 5/8]
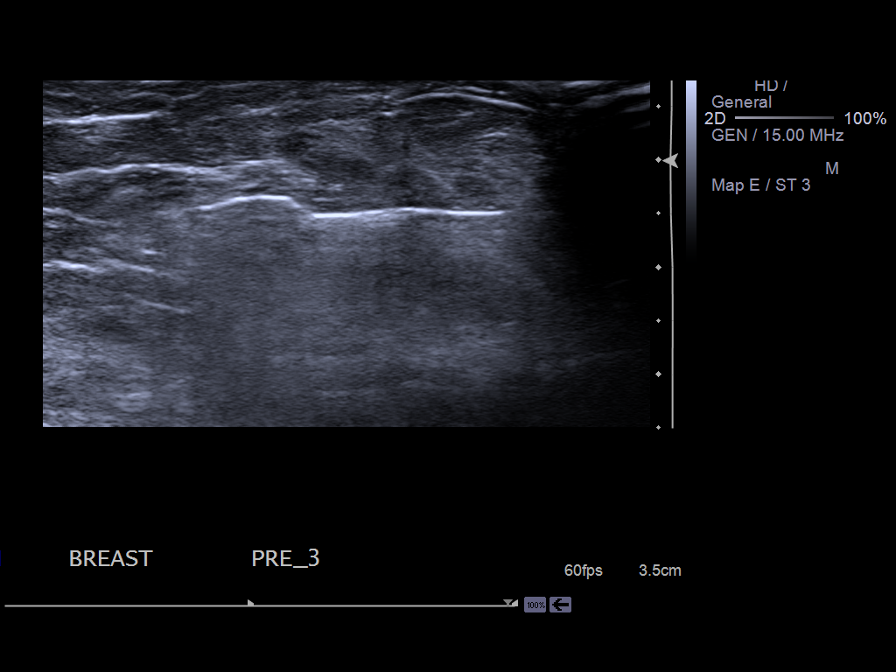
[im 6/8]
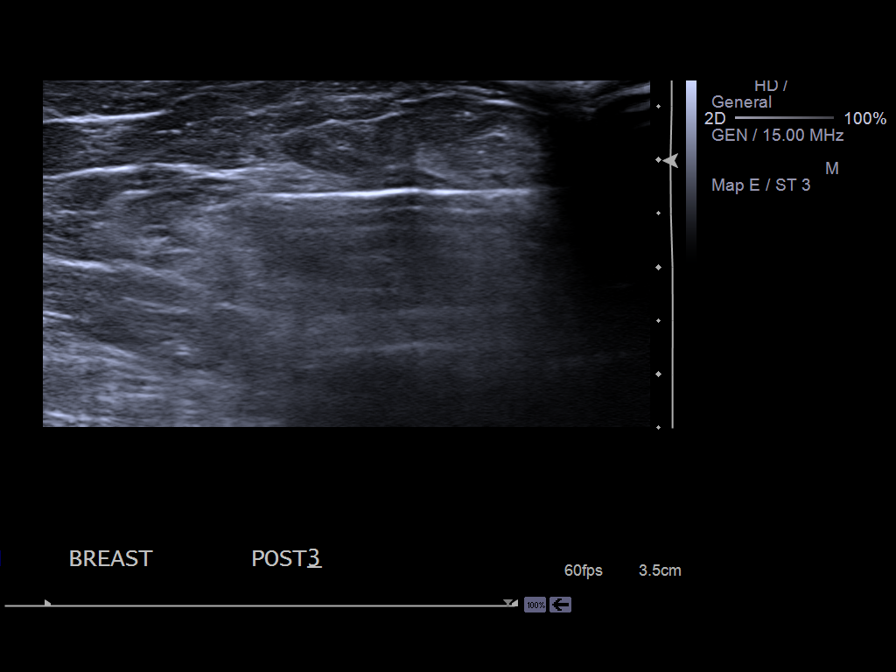
[im 7/8]
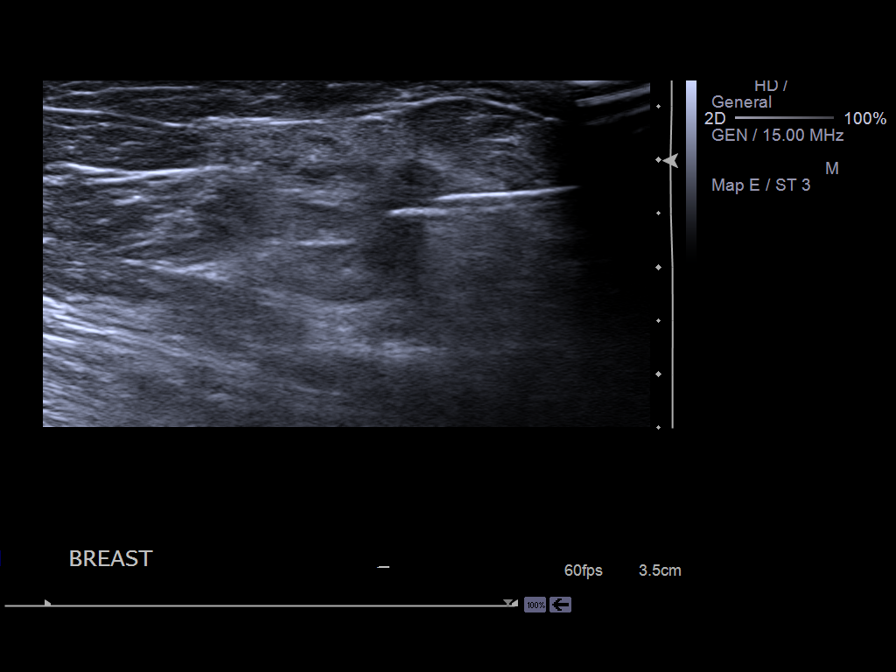
[im 8/8]
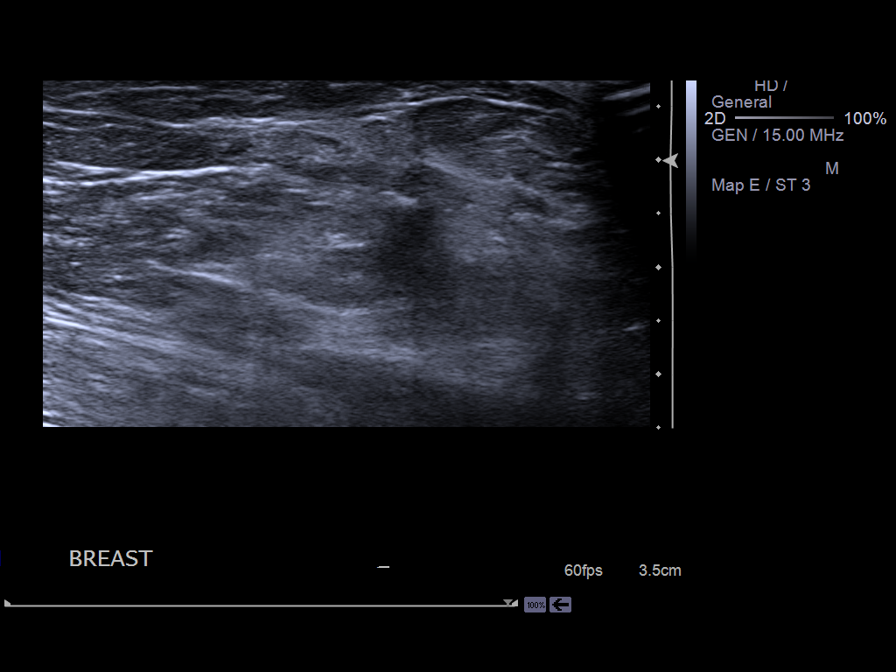

[8 of 8 positions shown; findings below may reference images not displayed]

PROCEDURE:
I met with the patient and we discussed the procedure of
ultrasound-guided biopsy, including benefits and alternatives. We
discussed the high likelihood of a successful procedure. We
discussed the risks of the procedure including infection, bleeding,
tissue injury, clip migration, and inadequate sampling. Informed
written consent was given. The usual time-out protocol was performed
immediately prior to the procedure.

Using sterile technique and 2% Lidocaine as local anesthetic, under
direct ultrasound visualization, a 12 gauge vacuum-assisted device
was used to perform biopsy of an indeterminate left breast mass at
11 o'clock, 7 cm from the nippleusing a lateral to medial approach.
At the conclusion of the procedure, a coil shaped tissue marker clip
was deployed into the biopsy cavity. Follow-up 2-view mammogram was
performed and dictated separately.
IMPRESSION: Ultrasound-guided biopsy of an indeterminate left breast mass at 11
o'clock. No apparent complications.

## 2016-11-02 IMAGING — MG MM POST US BIOPSY *L*
2 series · 2 of 2 positions shown · non-contrast
Comparison: Previous exam(s).

CLINICAL DATA: 63-year-old female status post ultrasound-guided
left breast biopsy

EXAM:
DIAGNOSTIC LEFT MAMMOGRAM POST ULTRASOUND BIOPSY

[L CC]
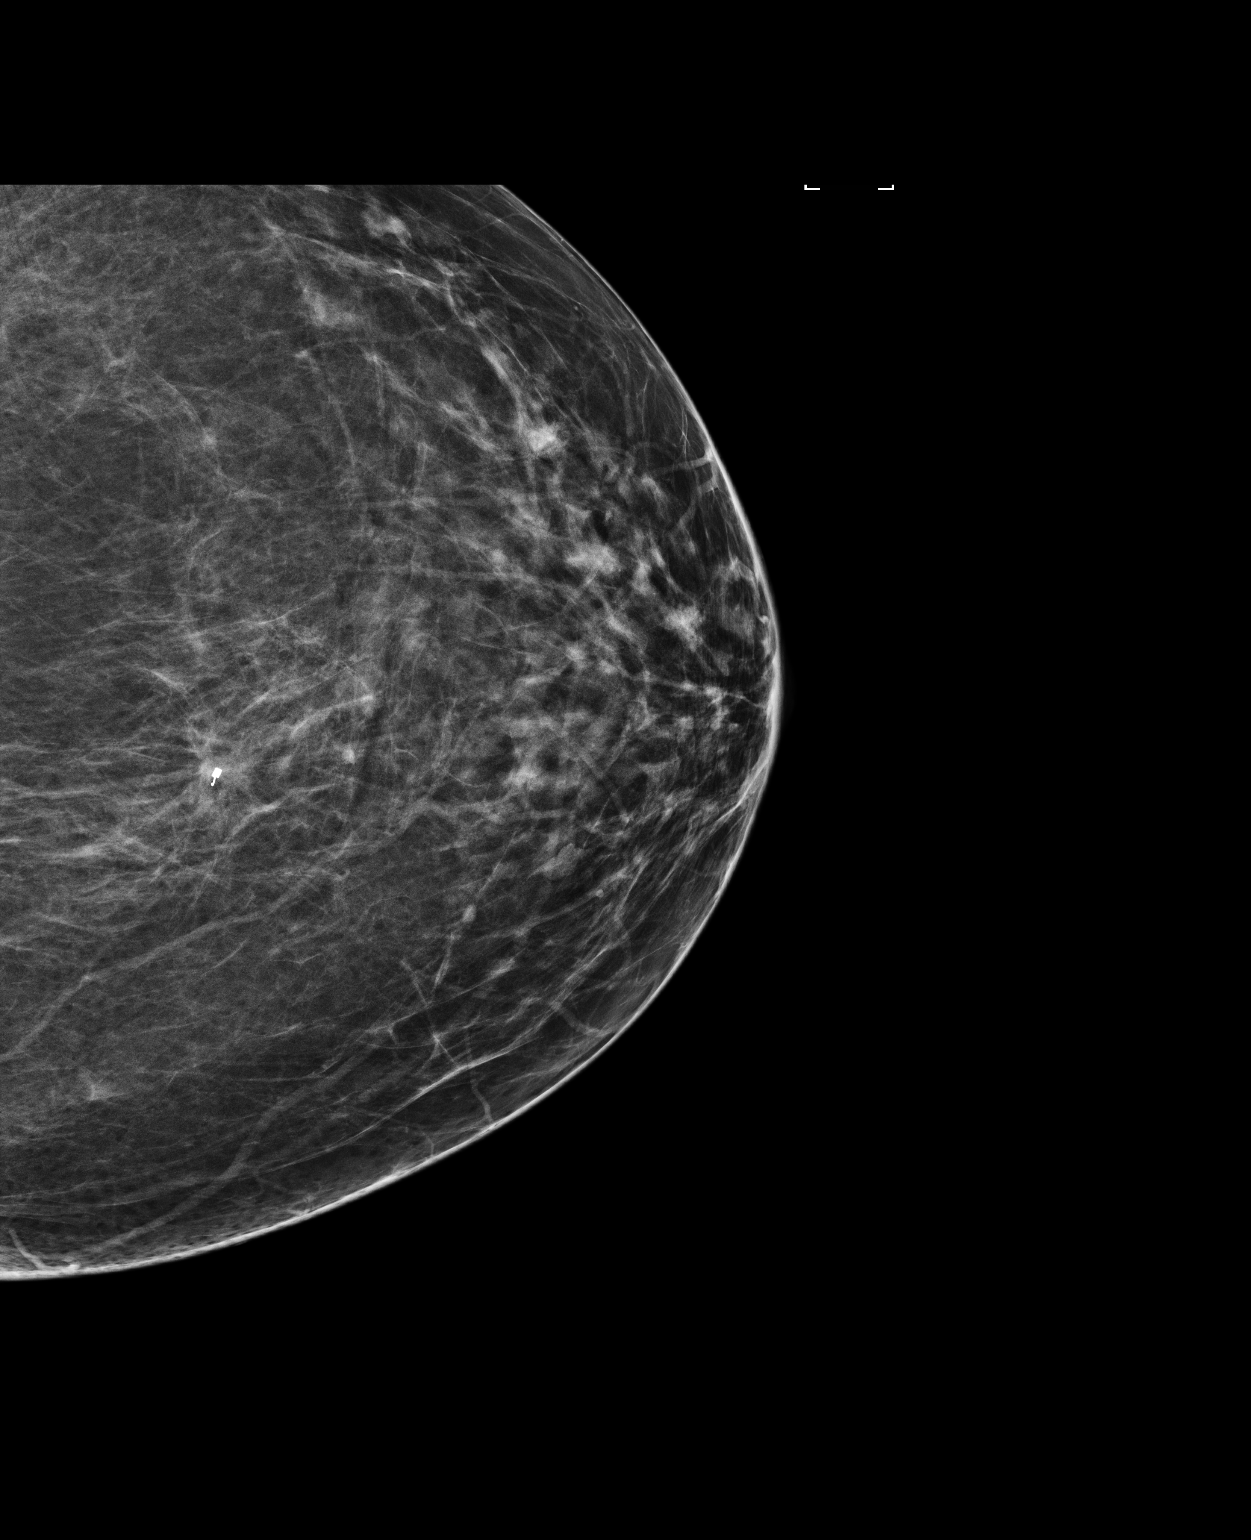

[L ML]
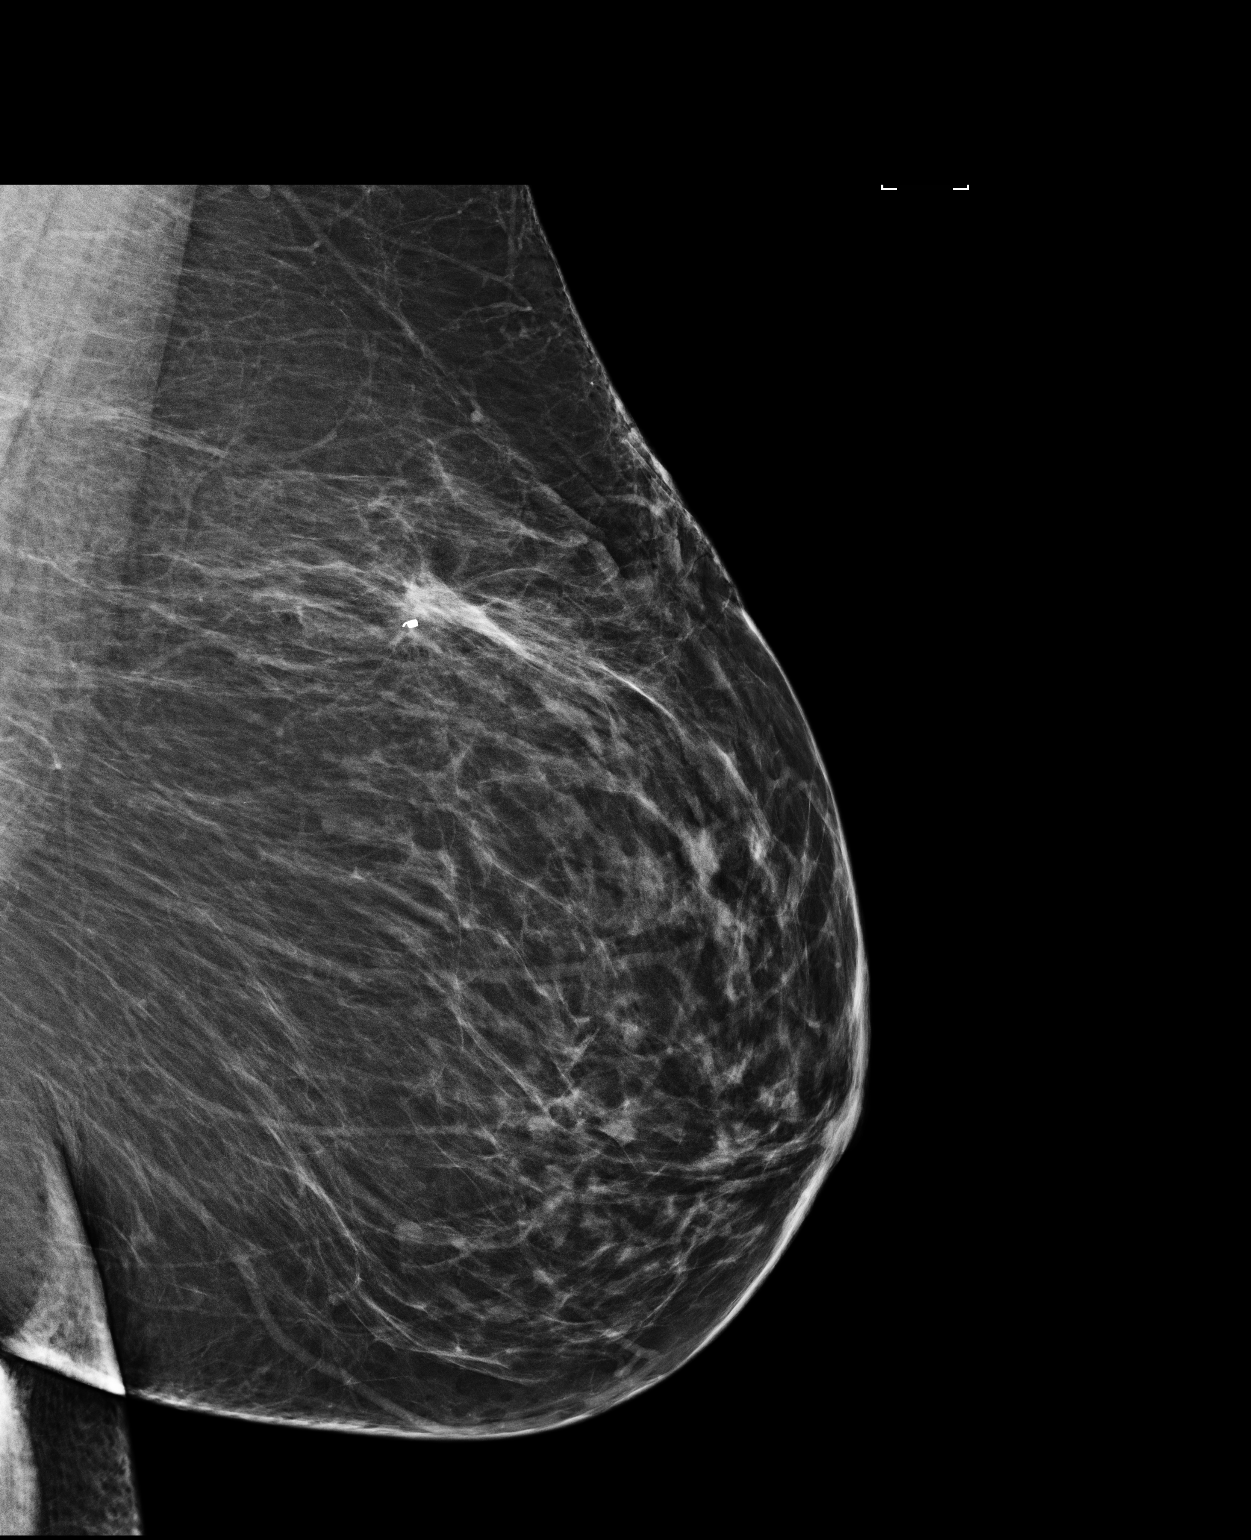

[2 of 2 positions shown; findings below may reference images not displayed]

FINDINGS: Mammographic images were obtained following ultrasound guided biopsy
of an indeterminate left breast mass at 11 o'clock. Post biopsy
mammogram demonstrates the coil shaped biopsy marker to be in the
expected position within the left breast mass at 11 o'clock.
IMPRESSION: Satisfactory marker placement post ultrasound-guided left breast
biopsy.

Final Assessment: Post Procedure Mammograms for Marker Placement

## 2016-11-06 ENCOUNTER — Inpatient Hospital Stay: Payer: Medicare HMO | Attending: Hematology and Oncology

## 2016-11-06 DIAGNOSIS — C50211 Malignant neoplasm of upper-inner quadrant of right female breast: Secondary | ICD-10-CM | POA: Diagnosis not present

## 2016-11-06 DIAGNOSIS — Z170 Estrogen receptor positive status [ER+]: Secondary | ICD-10-CM | POA: Insufficient documentation

## 2016-11-06 DIAGNOSIS — Z452 Encounter for adjustment and management of vascular access device: Secondary | ICD-10-CM | POA: Insufficient documentation

## 2016-11-06 DIAGNOSIS — Z95828 Presence of other vascular implants and grafts: Secondary | ICD-10-CM

## 2016-11-06 MED ORDER — HEPARIN SOD (PORK) LOCK FLUSH 100 UNIT/ML IV SOLN
INTRAVENOUS | Status: AC
  Filled 2016-11-06: qty 5

## 2016-11-06 MED ORDER — SODIUM CHLORIDE 0.9% FLUSH
10.0000 mL | INTRAVENOUS | Status: DC | PRN
  Administered 2016-11-06: 10 mL via INTRAVENOUS
  Filled 2016-11-06: qty 10

## 2016-11-06 MED ORDER — HEPARIN SOD (PORK) LOCK FLUSH 100 UNIT/ML IV SOLN
500.0000 [IU] | Freq: Once | INTRAVENOUS | Status: AC
  Administered 2016-11-06: 500 [IU] via INTRAVENOUS

## 2016-11-24 ENCOUNTER — Other Ambulatory Visit: Payer: Self-pay | Admitting: *Deleted

## 2016-11-24 ENCOUNTER — Inpatient Hospital Stay: Payer: Medicare HMO | Attending: Hematology and Oncology

## 2016-11-24 ENCOUNTER — Inpatient Hospital Stay (HOSPITAL_BASED_OUTPATIENT_CLINIC_OR_DEPARTMENT_OTHER): Payer: Medicare HMO | Admitting: Hematology and Oncology

## 2016-11-24 VITALS — BP 141/82 | HR 80 | Temp 96.4°F | Resp 18 | Wt 231.1 lb

## 2016-11-24 DIAGNOSIS — G629 Polyneuropathy, unspecified: Secondary | ICD-10-CM | POA: Insufficient documentation

## 2016-11-24 DIAGNOSIS — C50212 Malignant neoplasm of upper-inner quadrant of left female breast: Secondary | ICD-10-CM

## 2016-11-24 DIAGNOSIS — Z803 Family history of malignant neoplasm of breast: Secondary | ICD-10-CM | POA: Insufficient documentation

## 2016-11-24 DIAGNOSIS — Z923 Personal history of irradiation: Secondary | ICD-10-CM | POA: Insufficient documentation

## 2016-11-24 DIAGNOSIS — Z87891 Personal history of nicotine dependence: Secondary | ICD-10-CM | POA: Diagnosis not present

## 2016-11-24 DIAGNOSIS — Z85828 Personal history of other malignant neoplasm of skin: Secondary | ICD-10-CM

## 2016-11-24 DIAGNOSIS — R110 Nausea: Secondary | ICD-10-CM | POA: Diagnosis not present

## 2016-11-24 DIAGNOSIS — R109 Unspecified abdominal pain: Secondary | ICD-10-CM | POA: Diagnosis not present

## 2016-11-24 DIAGNOSIS — F329 Major depressive disorder, single episode, unspecified: Secondary | ICD-10-CM | POA: Insufficient documentation

## 2016-11-24 DIAGNOSIS — E785 Hyperlipidemia, unspecified: Secondary | ICD-10-CM | POA: Insufficient documentation

## 2016-11-24 DIAGNOSIS — Z79899 Other long term (current) drug therapy: Secondary | ICD-10-CM | POA: Diagnosis not present

## 2016-11-24 DIAGNOSIS — J449 Chronic obstructive pulmonary disease, unspecified: Secondary | ICD-10-CM | POA: Diagnosis not present

## 2016-11-24 DIAGNOSIS — K219 Gastro-esophageal reflux disease without esophagitis: Secondary | ICD-10-CM

## 2016-11-24 DIAGNOSIS — I10 Essential (primary) hypertension: Secondary | ICD-10-CM | POA: Insufficient documentation

## 2016-11-24 DIAGNOSIS — R17 Unspecified jaundice: Secondary | ICD-10-CM

## 2016-11-24 DIAGNOSIS — R531 Weakness: Secondary | ICD-10-CM | POA: Diagnosis not present

## 2016-11-24 DIAGNOSIS — Z171 Estrogen receptor negative status [ER-]: Secondary | ICD-10-CM

## 2016-11-24 DIAGNOSIS — Z7982 Long term (current) use of aspirin: Secondary | ICD-10-CM | POA: Diagnosis not present

## 2016-11-24 DIAGNOSIS — Z853 Personal history of malignant neoplasm of breast: Secondary | ICD-10-CM

## 2016-11-24 DIAGNOSIS — R300 Dysuria: Secondary | ICD-10-CM

## 2016-11-24 DIAGNOSIS — Z9221 Personal history of antineoplastic chemotherapy: Secondary | ICD-10-CM

## 2016-11-24 DIAGNOSIS — N6310 Unspecified lump in the right breast, unspecified quadrant: Secondary | ICD-10-CM

## 2016-11-24 LAB — CBC WITH DIFFERENTIAL/PLATELET
Basophils Absolute: 0.1 10*3/uL (ref 0–0.1)
Basophils Relative: 1 %
Eosinophils Absolute: 0.1 10*3/uL (ref 0–0.7)
Eosinophils Relative: 2 %
HCT: 43.5 % (ref 35.0–47.0)
Hemoglobin: 15.2 g/dL (ref 12.0–16.0)
Lymphocytes Relative: 35 %
Lymphs Abs: 2.7 10*3/uL (ref 1.0–3.6)
MCH: 30.7 pg (ref 26.0–34.0)
MCHC: 34.9 g/dL (ref 32.0–36.0)
MCV: 87.9 fL (ref 80.0–100.0)
Monocytes Absolute: 0.5 10*3/uL (ref 0.2–0.9)
Monocytes Relative: 7 %
Neutro Abs: 4.2 10*3/uL (ref 1.4–6.5)
Neutrophils Relative %: 55 %
Platelets: 178 10*3/uL (ref 150–440)
RBC: 4.95 MIL/uL (ref 3.80–5.20)
RDW: 13.2 % (ref 11.5–14.5)
WBC: 7.5 10*3/uL (ref 3.6–11.0)

## 2016-11-24 LAB — COMPREHENSIVE METABOLIC PANEL
ALT: 26 U/L (ref 14–54)
AST: 28 U/L (ref 15–41)
Albumin: 4.2 g/dL (ref 3.5–5.0)
Alkaline Phosphatase: 48 U/L (ref 38–126)
Anion gap: 9 (ref 5–15)
BUN: 12 mg/dL (ref 6–20)
CO2: 27 mmol/L (ref 22–32)
Calcium: 9.4 mg/dL (ref 8.9–10.3)
Chloride: 102 mmol/L (ref 101–111)
Creatinine, Ser: 1.09 mg/dL — ABNORMAL HIGH (ref 0.44–1.00)
GFR calc Af Amer: >60 mL/min (ref >60)
GFR calc non Af Amer: 52 mL/min — ABNORMAL LOW (ref >60)
Glucose, Bld: 108 mg/dL — ABNORMAL HIGH (ref 65–99)
Potassium: 3.9 mmol/L (ref 3.5–5.1)
Sodium: 138 mmol/L (ref 135–145)
Total Bilirubin: 1.5 mg/dL — ABNORMAL HIGH (ref 0.3–1.2)
Total Protein: 7.5 g/dL (ref 6.5–8.1)

## 2016-11-24 LAB — BILIRUBIN, DIRECT: Bilirubin, Direct: 0.1 mg/dL (ref 0.1–0.5)

## 2016-11-24 NOTE — Progress Notes (Signed)
Per pt has had abd,and bladder pain x 7 days. Urinary frequency w small amt burning she stated. Had 5 loose stools yesterday , non food related per pt. Per pt went to [facility] [place] 2 weeks ago and found to be having reaction to Levastatin. Which was d/c she stated

## 2016-11-24 NOTE — Progress Notes (Signed)
[facility] day:  11/24/2016   Chief Complaint: Madison Christensen is a 66 y.o. female with stage IA Her2/neu + left breast cancer who is seen for 4 month assessment.  HPI:  The patient was last seen in the medical oncology clinic on 07/22/2016.  At that time, she was denied any breast concerns.  Exam was stable.  Bilirubin was 1.5 (direct 0.2).  Symptomatically, she is "not feeling well" today.  She is having nausea without vomiting.  She denies constipation.  She has had diffuse abdominal discomfort, weakness, and polyarthralgias for the last week.  She had urinary symptoms since yesterday. Patient reports a "spell" x a few weeks ago. Her PCP took patient off of one of her cholesterol medications. Patient has had no fevers or weight loss.   Last colonoscopy was done by Dr. Vira Agar on 05/30/2015.  She denies any breast concerns.   Past Medical History:  Diagnosis Date  . Breast cancer ([place]) 2016   left  . Cancer ([place])    skin   . Carcinoma of left breast ([place]) 07/30/2014   1.4 cm, T1c, N0, ER negative, PR negative, HER-2 positive  . Complication of anesthesia 2004   oversensitive to noise, lights  . COPD (chronic obstructive pulmonary disease) ([place])   . Depression 1995  . GERD (gastroesophageal reflux disease)   . Hyperlipidemia   . Hypertension     Past Surgical History:  Procedure Laterality Date  . AXILLARY LYMPH NODE BIOPSY Left 08/03/2014   Procedure: AXILLARY LYMPH NODE BIOPSY;  Surgeon: Robert Bellow, MD;  Location: [facility] ORS;  Service: General;  Laterality: Left;  . BACK SURGERY    . BREAST BIOPSY Left   . BREAST LUMPECTOMY Left 2016  . BREAST LUMPECTOMY WITH AXILLARY LYMPH NODE DISSECTION Left 08/03/2014   Procedure: BREAST LUMPECTOMY WITH AXILLARY LYMPH NODE DISSECTION;  Surgeon: Robert Bellow, MD;  Location: [facility] ORS;  Service: General;  Laterality: Left;  . BREAST SURGERY Left Aug 03, 2014   Wide excision, sentinel  node biopsy.  . COLONOSCOPY  2013   Dr. Vira Agar  . COLONOSCOPY WITH PROPOFOL N/A 05/30/2015   Procedure: COLONOSCOPY WITH PROPOFOL;  Surgeon: Manya Silvas, MD;  Location: [facility] ENDOSCOPY;  Service: Endoscopy;  Laterality: N/A;  . PORTACATH PLACEMENT Right 08/31/2014   Procedure: INSERTION PORT-A-CATH;  Surgeon: Robert Bellow, MD;  Location: [facility] ORS;  Service: General;  Laterality: Right;  . REDUCTION MAMMAPLASTY Bilateral   . SENTINEL NODE BIOPSY Left 08/03/2014   Procedure: SENTINEL NODE BIOPSY;  Surgeon: Robert Bellow, MD;  Location: [facility] ORS;  Service: General;  Laterality: Left;  . skin cancer  removal  2015   nose   . TONSILLECTOMY      Family History  Problem Relation Age of Onset  . Breast cancer Mother 28          . Breast cancer Cousin 62            Social History:  reports that she quit smoking about 40 years ago. Her smoking use included Cigarettes. She has a 2.00 pack-year smoking history. She has never used smokeless tobacco. She reports that she drinks alcohol. She reports that she does not use drugs.  She is retired from [employer].  She lives in [place].  The patient is accompanied by her sister, Arville Go, today.  Allergies:  Allergies  Allergen Reactions  . Losartan Other (See Comments)  . Lovastatin Other (  See Comments)    Per pt headache  . Sulfa Antibiotics Hives and Rash    Current Medications: Current Outpatient Prescriptions  Medication Sig Dispense Refill  . acetaminophen (TYLENOL) 325 MG tablet Take 650 mg by mouth every 6 (six) hours as needed.    Marland Kitchen aspirin EC 81 MG tablet Take by mouth.    . cholecalciferol (VITAMIN D) 1000 units tablet Take 1,000 Units by mouth daily.    . citalopram (CELEXA) 20 MG tablet TAKE 1 TABLET (20 MG TOTAL) BY MOUTH DAILY. 30 tablet 5  . fluticasone (FLONASE) 50 MCG/ACT nasal spray Place 2 sprays into the nose daily.    Marland Kitchen gabapentin (NEURONTIN) 100 MG capsule TAKE 1 CAPSULE BY MOUTH THREE TIMES DAILY 90 capsule 1   . Ipratropium-Albuterol (COMBIVENT) 20-100 MCG/ACT AERS respimat Inhale into the lungs.    . Multiple Vitamin (MULTIVITAMIN) tablet Take 1 tablet by mouth daily.    . pravastatin (PRAVACHOL) 40 MG tablet Take 40 mg by mouth every morning.   3  . spironolactone (ALDACTONE) 25 MG tablet Take 25 mg by mouth 2 (two) times daily.     Marland Kitchen albuterol (PROVENTIL HFA;VENTOLIN HFA) 108 (90 Base) MCG/ACT inhaler Inhale 2 puffs into the lungs every 6 (six) hours as needed for wheezing or shortness of breath. (Patient not taking: Reported on 11/24/2016) 1 Inhaler 2  . colestipol (COLESTID) 1 g tablet TAKE 2 TABLETS (2 G TOTAL) BY MOUTH 2 (TWO) TIMES DAILY.  1  . lidocaine-prilocaine (EMLA) cream Apply 1 application topically as needed. (Patient not taking: Reported on 11/24/2016) 30 g 1  . triamterene-hydrochlorothiazide (MAXZIDE) 75-50 MG per tablet Take 1 tablet by mouth every morning.   4   No current facility-administered medications for this visit.    Facility-Administered Medications Ordered in Other Visits  Medication Dose Route Frequency Provider Last Rate Last Dose  . 0.9 %  sodium chloride infusion   Intravenous Continuous Manya Silvas, MD      . diphenhydrAMINE (BENADRYL) capsule 25 mg  25 mg Oral Once Choksi, Delorise Shiner, MD      . sodium chloride 0.9 % injection 10 mL  10 mL Intravenous PRN Forest Gleason, MD   10 mL at 09/25/14 1418  . sodium chloride 0.9 % injection 10 mL  10 mL Intravenous PRN Forest Gleason, MD   10 mL at 01/15/15 1325  . sodium chloride flush (NS) 0.9 % injection 10 mL  10 mL Intravenous PRN Lequita Asal, MD   10 mL at 07/22/16 1011    Review of Systems:  GENERAL:  Feels "ok". No fevers or sweats.  Weight down 1 pound. PERFORMANCE STATUS (ECOG): 1 HEENT:  No visual changes, runny nose, sore throat, mouth sores or tenderness. Lungs: Shortness of breath with exertion.  No cough.  No hemoptysis. Cardiac:  No chest pain, palpitations, orthopnea, or PND. GI: Stomach  problems x 1 week.  Nausea. No vomiting, diarrhea, constipation, melena or hematochezia.  Colonoscopy 05/30/2015. GU:  Dysuria with frequency. Suprapubic tenderness. No CVA tenderness. No urgency or hematuria. Musculoskeletal:  No back pain.  Joints ache.  No muscle tenderness. Extremities:  No pain or swelling. Skin:  No rashes or skin changes. Neuro:  Neuropathy in fingers and toes (once in awhile).  No headache, weakness, balance or coordination issues. Endocrine:  No diabetes, thyroid issues, hot flashes or night sweats. Psych:  No mood changes, depression or anxiety. Pain:  No focal pain. Review of systems:  All other systems reviewed  and found to be negative.  Physical Exam: Blood pressure (!) 141/82, pulse 80, temperature (!) 96.4 F (35.8 C), temperature source Tympanic, resp. rate 18, weight 231 lb 1.6 oz (104.8 kg). GENERAL:  Well developed, well nourished, woman sitting comfortably in the exam room in no acute distress. MENTAL STATUS:  Alert and oriented to person, place and time. HEAD:  Short styled white hair.  Normocephalic, atraumatic, face symmetric, no Cushingoid features. EYES:  Glasses.  Blue eyes.  Pupils equal round and reactive to light and accomodation.  No conjunctivitis or scleral icterus. ENT:  Oropharynx clear without lesion.  Tongue normal. Mucous membranes moist.  RESPIRATORY:  Clear to auscultation without rales, wheezes or rhonchi. CARDIOVASCULAR:  Regular rate and rhythm without murmur, rub or gallop. BREAST:  Right breast with tiny pea sized nodule/cyst lateral to the reduction scar.  No skin changes or nipple discharge.  Left breast with a 4 cm area of post-operative and post radiation changes at 12 - 2 o'clock position.  Mild inferior breast edema.  No masses, skin changes or nipple discharge.  ABDOMEN:  Soft, non-tender, with active bowel sounds, and no hepatosplenomegaly.  No guarding or rebound tenderness.  No masses. SKIN:  No rashes, ulcers or  lesions. EXTREMITIES: No edema, no skin discoloration or tenderness.  No palpable cords. LYMPH NODES: No palpable cervical, supraclavicular, axillary or inguinal adenopathy  PSYCH:  Appropriate.   Appointment on 11/24/2016  Component Date Value Ref Range Status  . WBC 11/24/2016 7.5  3.6 - 11.0 K/uL Final  . RBC 11/24/2016 4.95  3.80 - 5.20 MIL/uL Final  . Hemoglobin 11/24/2016 15.2  12.0 - 16.0 g/dL Final  . HCT 11/24/2016 43.5  35.0 - 47.0 % Final  . MCV 11/24/2016 87.9  80.0 - 100.0 fL Final  . MCH 11/24/2016 30.7  26.0 - 34.0 pg Final  . MCHC 11/24/2016 34.9  32.0 - 36.0 g/dL Final  . RDW 11/24/2016 13.2  11.5 - 14.5 % Final  . Platelets 11/24/2016 178  150 - 440 K/uL Final  . Neutrophils Relative % 11/24/2016 55  % Final  . Neutro Abs 11/24/2016 4.2  1.4 - 6.5 K/uL Final  . Lymphocytes Relative 11/24/2016 35  % Final  . Lymphs Abs 11/24/2016 2.7  1.0 - 3.6 K/uL Final  . Monocytes Relative 11/24/2016 7  % Final  . Monocytes Absolute 11/24/2016 0.5  0.2 - 0.9 K/uL Final  . Eosinophils Relative 11/24/2016 2  % Final  . Eosinophils Absolute 11/24/2016 0.1  0 - 0.7 K/uL Final  . Basophils Relative 11/24/2016 1  % Final  . Basophils Absolute 11/24/2016 0.1  0 - 0.1 K/uL Final  . Sodium 11/24/2016 138  135 - 145 mmol/L Final  . Potassium 11/24/2016 3.9  3.5 - 5.1 mmol/L Final  . Chloride 11/24/2016 102  101 - 111 mmol/L Final  . CO2 11/24/2016 27  22 - 32 mmol/L Final  . Glucose, Bld 11/24/2016 108* 65 - 99 mg/dL Final  . BUN 11/24/2016 12  6 - 20 mg/dL Final  . Creatinine, Ser 11/24/2016 1.09* 0.44 - 1.00 mg/dL Final  . Calcium 11/24/2016 9.4  8.9 - 10.3 mg/dL Final  . Total Protein 11/24/2016 7.5  6.5 - 8.1 g/dL Final  . Albumin 11/24/2016 4.2  3.5 - 5.0 g/dL Final  . AST 11/24/2016 28  15 - 41 U/L Final  . ALT 11/24/2016 26  14 - 54 U/L Final  . Alkaline Phosphatase 11/24/2016 48  38 - 126  U/L Final  . Total Bilirubin 11/24/2016 1.5* 0.3 - 1.2 mg/dL Final  . GFR calc non Af  Amer 11/24/2016 52* >60 mL/min Final  . GFR calc Af Amer 11/24/2016 >60  >60 mL/min Final   Comment: (NOTE) The eGFR has been calculated using the CKD EPI equation. This calculation has not been validated in all clinical situations. eGFR's persistently <60 mL/min signify possible Chronic Kidney Disease.   . Anion gap 11/24/2016 9  5 - 15 Final  . Bilirubin, Direct 11/24/2016 0.1  0.1 - 0.5 mg/dL Final    Assessment:  LORIENE TAUNTON is a 66 y.o. female with stage IA Her2/neu + left breast cancer status wide excision on  08/03/2014.  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  Lymphvascular invasion was indeterminate (+ on original biopsy).  DCIS was present.  Three sentinel lymph nodes were negative.  Tumor was ER -, PR -, and Her2/neu 3+ by IHC.  Pathologic stage was T1cN0M0.  She received adjuvant Taxol and Herceptin.  She received 10 of 12 weeks of Taxol (09/04/2014 - 11/06/2014).  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  She received Herceptin x 6 months (09/04/2014 - 03/27/2015).  Herceptin was discontinued secondary to myalgias.  She completed radiation therapy in 02/2015.  Bilateral mammogram on 07/18/2015 revealed no evidence of malignancy.  Bilateral mammogram on 07/22/2016 was benign.  There were scattered areas of fibroglandular density.  Post surgical changes were noted in the left breast.   CA27.29 has been followed: 16.3 on 03/11/2016, 9.8 on 07/22/2016, and 18.6 on 11/24/2016.  Bone density study on 05/12/2016 revealed osteopenia with a T score of -1.1 in the right femur and -0.4 in the AP spine L1-L2.  She has a history on an elevated bilirubin (indirect).  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  Symptomatically, she has had "stomach problems" x 1 week.  She has urinary frequency and burning.  Bilirubin is 1.5 (direct 0.1). CBC and chemistries are unremarkable. Exam reveals a tiny palpable mass in the RIGHT breast lateral to the  reduction scar.  Plan: 1.  Labs today:  CBC with diff, CMP, direct bilirubin, CA27.29 2.  Urinary symptoms today. Will obtain a UA and culture.  3.  Ultrasound RIGHT breast for palpable mass at the 7-8 o'clock position lateral to the incision.  4.  Discuss port removal. Will refer patient to back to Dr. Bary Castilla once the ultrasound is negative. 5.  Port flushes until port is removed.  6.  RTC in 4 months for MD assessment and labs (CBC with diff, CMP, CA27.29)   Honor Loh, NP  11/24/2016, 11:09 AM   I saw and evaluated the patient, participating in the key portions of the service and reviewing pertinent diagnostic studies and records.  I reviewed the nurse practitioner's note and agree with the findings and the plan.  The assessment and plan were discussed with the patient.  Additional diagnostic studies of ultrasound are needed to clarify the tiny palpable abnormality in the right breast and would change the clinical management.  Multiple questions were asked by the patient and answered.   Lequita Asal, MD 11/24/2016, 11:09 AM

## 2016-11-25 LAB — CA 27.29 (SERIAL MONITOR): CA 27.29: 18.6 U/mL (ref 0.0–38.6)

## 2016-12-01 ENCOUNTER — Ambulatory Visit
Admission: RE | Admit: 2016-12-01 | Discharge: 2016-12-01 | Disposition: A | Discharge status: Home / Self Care | Payer: Medicare HMO | Source: Ambulatory Visit | Attending: Urgent Care | Admitting: Urgent Care

## 2016-12-01 ENCOUNTER — Other Ambulatory Visit: Payer: Self-pay | Admitting: Urgent Care

## 2016-12-01 DIAGNOSIS — N6310 Unspecified lump in the right breast, unspecified quadrant: Secondary | ICD-10-CM

## 2016-12-01 DIAGNOSIS — Z171 Estrogen receptor negative status [ER-]: Secondary | ICD-10-CM | POA: Insufficient documentation

## 2016-12-01 DIAGNOSIS — C50212 Malignant neoplasm of upper-inner quadrant of left female breast: Secondary | ICD-10-CM

## 2016-12-05 ENCOUNTER — Encounter: Payer: Self-pay | Admitting: Hematology and Oncology

## 2016-12-18 ENCOUNTER — Inpatient Hospital Stay: Payer: Medicare HMO | Attending: Hematology and Oncology

## 2016-12-23 ENCOUNTER — Other Ambulatory Visit: Payer: Self-pay | Admitting: Medical Oncology

## 2016-12-23 DIAGNOSIS — R109 Unspecified abdominal pain: Secondary | ICD-10-CM

## 2016-12-24 ENCOUNTER — Ambulatory Visit: Payer: Medicare HMO

## 2016-12-25 ENCOUNTER — Ambulatory Visit
Admission: RE | Admit: 2016-12-25 | Discharge: 2016-12-25 | Disposition: A | Discharge status: Home / Self Care | Payer: Medicare HMO | Source: Ambulatory Visit | Attending: Medical Oncology | Admitting: Medical Oncology

## 2016-12-25 DIAGNOSIS — R102 Pelvic and perineal pain: Secondary | ICD-10-CM | POA: Diagnosis present

## 2016-12-25 DIAGNOSIS — R109 Unspecified abdominal pain: Secondary | ICD-10-CM

## 2016-12-25 DIAGNOSIS — D259 Leiomyoma of uterus, unspecified: Secondary | ICD-10-CM | POA: Diagnosis not present

## 2017-01-15 NOTE — H&P (View-Only) (Signed)
HPI:  Madison Christensen is a 66 y.o. G1P1 female here for Discuss surgery . She is new to our practice today, referred by: A mutual friend and patient She had postmenopausal bleeding twice in the last two months, and was being worked up by her GYN, who is retiring.  She is seeking to establish care with another provider to assume her care.  She developed the sensation of pelvic pressure and pain about 3-4 months ago.  Was treated for a UTI, denies significant changes in BM or Urinary habits, no trauma, back injury.   About 26mo ago she [place] present when wiping, not in toilet, nor undergarments.  After this she had another episode of bloody discharge - in combination of mucous and blood, not outright bleeding.  She saw Dr. ATim Lair2 weeks ago for this, and an EMB was attempted but pipelle could not pass through internal os.   Ultrasound was obtained, TV and TA, which showed a small midline uterus, with EE of 629m and a tubular/oblong structure in the right adnexa - 6 x 2cm, likely dilated fallopian tube. No abnormal structures in the left adnexa.  She has not had any bleeding since, but continues to have cramping, intermittently, for which she occasionally needs Tylenol #3. No activity makes anything worse or better; she is not sexually active during this time.  She is s/p LEFT, ER neg, HER2/Neu + Breast cancer with lumpectomy and axillary LN dissection by Dr ByBary Castillan May 2016. Stage !A S/p chemo and no tamoxifen.  No hx HRT             Problem List  Date Reviewed: 1009-10-25       Codes Priority Class Noted - Resolved   Mild chronic obstructive pulmonary disease , unspecified (CMS-HCC) ICD-10-CM: J44.9 ICD-9-CM: 49528 01/09/2016 - Present   Malignant neoplasm of left female breast , unspecified (CMS-HCC) ICD-10-CM: C5U13.244CD-9-CM: 174.9   11/16/2015 - Present   Overview Signed 11/16/2015  6:41 AM by GeAtilano MedianMD     She was diagnosed in 2009 with invasive left breast cancer. [facility] Currently undergoing chemotherapy.      Essential hypertension ICD-10-CM: I10 ICD-9-CM: 401.9   11/16/2015 - Present   Chronic diarrhea, unspecified ICD-10-CM: K52.9 ICD-9-CM: 787.91   11/16/2015 - Present   Stage 3 chronic kidney disease (CMS-HCC) ICD-10-CM: N18.3 ICD-9-CM: 585.3   11/16/2015 - Present   MDD (major depressive disorder), recurrent episode, moderate (CMS-HCC) ICD-10-CM: F33.1 ICD-9-CM: 296.32   11/16/2015 - Present   Chronic cough ICD-10-CM: R05 ICD-9-CM: 786.2   11/16/2015 - Present   Hyperlipidemia, unspecified ICD-10-CM: E78.5 ICD-9-CM: 272.4   11/16/2015 - Present   Depression, unspecified ICD-10-CM: F32.9 ICD-9-CM: 311   Unknown - Present   Low potassium syndrome ICD-10-CM: E87.6 ICD-9-CM: 276.8   Unknown - Present       Past Medical History:  has a past medical history of Breast cancer (CMS-HCC), Depression, Hyperlipidemia, Hypertension, Low potassium syndrome, and Malignant neoplasm of left female breast (CMS-HCC) (11/16/2015).  Past Surgical History:  has a past surgical history that includes back surgery; Colonoscopy (09/14/2009); Colonoscopy (05/30/2015); Tonsillectomy; Breast surgery (2016); and Breast lumpectomy (Left, 2016). Family History: family history includes Breast cancer in her mother; Clotting disorder in her maternal aunt; Diabetes in her paternal grandmother and sister; Heart disease in her father and sister; Stroke in her father. Social  History:  reports that she has quit smoking. She has never used smokeless tobacco. She reports that she drinks about 0.6 - 1.2 oz of alcohol per week. She reports that she does not use drugs. OB/GYN History:          OB History    Gravida  1   Para  1   Term      Preterm      AB      Living        SAB      TAB      Ectopic      Molar      Multiple      Live Births              Allergies: is allergic to losartan; lovastatin; and sulfa (sulfonamide antibiotics). Medications:  Current Outpatient Medications:  .  *multivitamin oral, , Disp: , Rfl:  .  *potassium chloride oral, , Disp: , Rfl:  .  acetaminophen (TYLENOL) 325 MG tablet, Take by mouth, Disp: , Rfl:  .  albuterol 90 mcg/actuation inhaler, Inhale into the lungs, Disp: , Rfl:  .  aspirin 81 MG EC tablet, Take 81 mg by mouth once daily., Disp: , Rfl:  .  citalopram (CELEXA) 20 MG tablet, Take 1.5 tablets (30 mg total) by mouth once daily. (Patient taking differently: Take 20 mg by mouth once daily.  ), Disp: 45 tablet, Rfl: 11 .  ergocalciferol, vitamin D2, (VITAMIN D2) 50,000 unit capsule, Take 1 capsule (50,000 Units total) by mouth once a week., Disp: 12 capsule, Rfl: 3 .  fluticasone (FLONASE) 50 mcg/actuation nasal spray, Place 2 sprays into both nostrils once daily., Disp: 16 g, Rfl: 12 .  gabapentin (NEURONTIN) 100 MG capsule, Take 2 tablets by mouth in the am and 1 tablet by mouth in the pm, Disp: 90 capsule, Rfl: 11 .  ipratropium-albuterol (COMBIVENT RESPIMAT) 20-100 mcg/actuation inhaler, Inhale 2 inhalations into the lungs 3 (three) times daily as needed for Wheezing., Disp: 1 Inhaler, Rfl: 12 .  lidocaine-prilocaine (EMLA) cream, Apply topically, Disp: , Rfl:  .  pravastatin (PRAVACHOL) 40 MG tablet, , Disp: , Rfl:  .  spironolactone (ALDACTONE) 25 MG tablet, Take by mouth, Disp: , Rfl:  .  triamterene-hydrochlorothiazide (MAXZIDE) 75-50 mg tablet, Take by mouth, Disp: , Rfl:   Review of Systems (positives per HPI): General:                      No fatigue or weight loss Eyes:                           No vision changes Ears:                            No hearing difficulty Respiratory:                No cough or shortness of breath Pulmonary:                  No asthma or shortness of breath Cardiovascular:           No chest pain, palpitations, dyspnea on  exertion Gastrointestinal:          No abdominal bloating, chronic diarrhea, constipation, masses, pain or hematochezia Genitourinary:             No hematuria, dysuria, abnormal vaginal discharge,  pelvic pain, Menometrorrhagia Lymphatic:                   No swollen lymph nodes Musculoskeletal:         No muscle weakness Neurologic:                  No extremity weakness, syncope, seizure disorder Psychiatric:                  No delusions or suicidal/homicidal ideation    Exam:   Constitutional: BP 146/86   Ht 163.8 cm (5' 4.5")   Wt (!) 105.4 kg (232 lb 6.4 oz)   BMI 39.28 kg/m    WDWN white female in NAD   HEENT: sclera clear, non-icteric, moist mucous membranes, dentition intact Endocrine:  no thyromegaly Respiratory: normal respiratory effort    CV: no peripheral edema Skin: warm and well perfused, no rashes Neuro: alert, oriented x3,   Psych: appropriate mood and insight, judgement intact  Data: Reviewed: labs, results, imaging, old records from Drs. [place], [place], and [place]. Ultrasound personally reviewed with patient and family.   Impression:   The primary encounter diagnosis was PMB (postmenopausal bleeding). A diagnosis of Adnexal mass was also pertinent to this visit.    Plan:   SHe has two issues at hand: 1. Post menopausal bleeding without sampling and  2. Adnexal mass  The mass appears to be a dilated tube, and is curious given her history of breast cancer. Is this fallopian tube cancer?  Is it a hematoma from postmenopausal bleeding which could not adequately pass the stenotic internal os?  Is it an infection/ PID?   Her pressure and cramping can be explained with this mass, and she is relieved to know she has a reason for it.    I think definitely a sampling of the lining is in order; and this will be done in the OR under sedation, WITHOUT hysteroscopy - whatever is in the tube could be spread further into the abdominal cavity with  distention and pressure via hysteroscopy.  She agrees to visit the OR for this to get a sampling. The reason I want to do this first, is because if she has endometrial cancer or atypical hyperplasia, I'd want to do her surgery with lymph node sampling and with oncology.  I also will likely want to get a CT scan to assess the characteristics of the adnexa/pelvis and if there are other Masses or enlarges lymph nodes. CA 125 collected today.  Depending on the results of the biopsy, we will consider surgical exploration and removal of the right adnexa.  Perhaps she will request or require removal of the bilateral adnexa and/or uterus and cervix.    Need to follow up to see if she had comprehensive genetic testing following the diagnosis of her breast cancer.  Signed consents for [facility] today, will schedule soon. Risks and benefits reviewed in detail     ----- Larey Days, MD Attending Obstetrician and Gynecologist [facility], Department of [facility]

## 2017-01-15 NOTE — H&P (Signed)
HPI:  Madison Christensen is a 66 y.o. G1P1 female here for Discuss surgery . She is new to our practice today, referred by: A mutual friend and patient She had postmenopausal bleeding twice in the last two months, and was being worked up by her GYN, who is retiring.  She is seeking to establish care with another provider to assume her care.  She developed the sensation of pelvic pressure and pain about 3-4 months ago.  Was treated for a UTI, denies significant changes in BM or Urinary habits, no trauma, back injury.   About 2mos ago she had her first episode of PMB while on vacation in [place] - present when wiping, not in toilet, nor undergarments.  After this she had another episode of bloody discharge - in combination of mucous and blood, not outright bleeding.  She saw Dr. Arias 2 weeks ago for this, and an EMB was attempted but pipelle could not pass through internal os.   Ultrasound was obtained, TV and TA, which showed a small midline uterus, with EE of 6mm, and a tubular/oblong structure in the right adnexa - 6 x 2cm, likely dilated fallopian tube. No abnormal structures in the left adnexa.  She has not had any bleeding since, but continues to have cramping, intermittently, for which she occasionally needs Tylenol #3. No activity makes anything worse or better; she is not sexually active during this time.  She is s/p LEFT, ER neg, HER2/Neu + Breast cancer with lumpectomy and axillary LN dissection by Dr Byrnett in May 2016. Stage !A S/p chemo and no tamoxifen.  No hx HRT             Problem List  Date Reviewed: 12/26/2016         Codes Priority Class Noted - Resolved   Mild chronic obstructive pulmonary disease , unspecified (CMS-HCC) ICD-10-CM: J44.9 ICD-9-CM: 496   01/09/2016 - Present   Malignant neoplasm of left female breast , unspecified (CMS-HCC) ICD-10-CM: C50.912 ICD-9-CM: 174.9   11/16/2015 - Present   Overview Signed 11/16/2015  6:41 AM by George, Sionne Anhelicia Cid, MD     She was diagnosed in 2009 with invasive left breast cancer. She is being followed by [facility]. Currently undergoing chemotherapy.      Essential hypertension ICD-10-CM: I10 ICD-9-CM: 401.9   11/16/2015 - Present   Chronic diarrhea, unspecified ICD-10-CM: K52.9 ICD-9-CM: 787.91   11/16/2015 - Present   Stage 3 chronic kidney disease (CMS-HCC) ICD-10-CM: N18.3 ICD-9-CM: 585.3   11/16/2015 - Present   MDD (major depressive disorder), recurrent episode, moderate (CMS-HCC) ICD-10-CM: F33.1 ICD-9-CM: 296.32   11/16/2015 - Present   Chronic cough ICD-10-CM: R05 ICD-9-CM: 786.2   11/16/2015 - Present   Hyperlipidemia, unspecified ICD-10-CM: E78.5 ICD-9-CM: 272.4   11/16/2015 - Present   Depression, unspecified ICD-10-CM: F32.9 ICD-9-CM: 311   Unknown - Present   Low potassium syndrome ICD-10-CM: E87.6 ICD-9-CM: 276.8   Unknown - Present       Past Medical History:  has a past medical history of Breast cancer (CMS-HCC), Depression, Hyperlipidemia, Hypertension, Low potassium syndrome, and Malignant neoplasm of left female breast (CMS-HCC) (11/16/2015).  Past Surgical History:  has a past surgical history that includes back surgery; Colonoscopy (09/14/2009); Colonoscopy (05/30/2015); Tonsillectomy; Breast surgery (2016); and Breast lumpectomy (Left, 2016). Family History: family history includes Breast cancer in her mother; Clotting disorder in her maternal aunt; Diabetes in her paternal grandmother and sister; Heart disease in her father and sister; Stroke in her father. Social   History:  reports that she has quit smoking. She has never used smokeless tobacco. She reports that she drinks about 0.6 - 1.2 oz of alcohol per week. She reports that she does not use drugs. OB/GYN History:          OB History    Gravida  1   Para  1   Term      Preterm      AB      Living        SAB      TAB      Ectopic      Molar      Multiple      Live Births              Allergies: is allergic to losartan; lovastatin; and sulfa (sulfonamide antibiotics). Medications:  Current Outpatient Medications:  .  *multivitamin oral, , Disp: , Rfl:  .  *potassium chloride oral, , Disp: , Rfl:  .  acetaminophen (TYLENOL) 325 MG tablet, Take by mouth, Disp: , Rfl:  .  albuterol 90 mcg/actuation inhaler, Inhale into the lungs, Disp: , Rfl:  .  aspirin 81 MG EC tablet, Take 81 mg by mouth once daily., Disp: , Rfl:  .  citalopram (CELEXA) 20 MG tablet, Take 1.5 tablets (30 mg total) by mouth once daily. (Patient taking differently: Take 20 mg by mouth once daily.  ), Disp: 45 tablet, Rfl: 11 .  ergocalciferol, vitamin D2, (VITAMIN D2) 50,000 unit capsule, Take 1 capsule (50,000 Units total) by mouth once a week., Disp: 12 capsule, Rfl: 3 .  fluticasone (FLONASE) 50 mcg/actuation nasal spray, Place 2 sprays into both nostrils once daily., Disp: 16 g, Rfl: 12 .  gabapentin (NEURONTIN) 100 MG capsule, Take 2 tablets by mouth in the am and 1 tablet by mouth in the pm, Disp: 90 capsule, Rfl: 11 .  ipratropium-albuterol (COMBIVENT RESPIMAT) 20-100 mcg/actuation inhaler, Inhale 2 inhalations into the lungs 3 (three) times daily as needed for Wheezing., Disp: 1 Inhaler, Rfl: 12 .  lidocaine-prilocaine (EMLA) cream, Apply topically, Disp: , Rfl:  .  pravastatin (PRAVACHOL) 40 MG tablet, , Disp: , Rfl:  .  spironolactone (ALDACTONE) 25 MG tablet, Take by mouth, Disp: , Rfl:  .  triamterene-hydrochlorothiazide (MAXZIDE) 75-50 mg tablet, Take by mouth, Disp: , Rfl:   Review of Systems (positives per HPI): General:                      No fatigue or weight loss Eyes:                           No vision changes Ears:                            No hearing difficulty Respiratory:                No cough or shortness of breath Pulmonary:                  No asthma or shortness of breath Cardiovascular:           No chest pain, palpitations, dyspnea on  exertion Gastrointestinal:          No abdominal bloating, chronic diarrhea, constipation, masses, pain or hematochezia Genitourinary:             No hematuria, dysuria, abnormal vaginal discharge,   pelvic pain, Menometrorrhagia Lymphatic:                   No swollen lymph nodes Musculoskeletal:         No muscle weakness Neurologic:                  No extremity weakness, syncope, seizure disorder Psychiatric:                  No delusions or suicidal/homicidal ideation    Exam:   Constitutional: BP 146/86   Ht 163.8 cm (5' 4.5")   Wt (!) 105.4 kg (232 lb 6.4 oz)   BMI 39.28 kg/m    WDWN white female in NAD   HEENT: sclera clear, non-icteric, moist mucous membranes, dentition intact Endocrine:  no thyromegaly Respiratory: normal respiratory effort    CV: no peripheral edema Skin: warm and well perfused, no rashes Neuro: alert, oriented x3,   Psych: appropriate mood and insight, judgement intact  Data: Reviewed: labs, results, imaging, old records from Drs. Corocorn, Byrnett, and Arias. Ultrasound personally reviewed with patient and family.   Impression:   The primary encounter diagnosis was PMB (postmenopausal bleeding). A diagnosis of Adnexal mass was also pertinent to this visit.    Plan:   SHe has two issues at hand: 1. Post menopausal bleeding without sampling and  2. Adnexal mass  The mass appears to be a dilated tube, and is curious given her history of breast cancer. Is this fallopian tube cancer?  Is it a hematoma from postmenopausal bleeding which could not adequately pass the stenotic internal os?  Is it an infection/ PID?   Her pressure and cramping can be explained with this mass, and she is relieved to know she has a reason for it.    I think definitely a sampling of the lining is in order; and this will be done in the OR under sedation, WITHOUT hysteroscopy - whatever is in the tube could be spread further into the abdominal cavity with  distention and pressure via hysteroscopy.  She agrees to visit the OR for this to get a sampling. The reason I want to do this first, is because if she has endometrial cancer or atypical hyperplasia, I'd want to do her surgery with lymph node sampling and with oncology.  I also will likely want to get a CT scan to assess the characteristics of the adnexa/pelvis and if there are other Masses or enlarges lymph nodes. CA 125 collected today.  Depending on the results of the biopsy, we will consider surgical exploration and removal of the right adnexa.  Perhaps she will request or require removal of the bilateral adnexa and/or uterus and cervix.    Need to follow up to see if she had comprehensive genetic testing following the diagnosis of her breast cancer.  Signed consents for D&C today, will schedule soon. Risks and benefits reviewed in detail     ----- Kivon Aprea, MD Attending Obstetrician and Gynecologist [facility], Department of OB/GYN [facility]       

## 2017-01-16 ENCOUNTER — Ambulatory Visit
Admission: RE | Admit: 2017-01-16 | Discharge: 2017-01-16 | Disposition: A | Discharge status: Home / Self Care | Payer: Medicare HMO | Source: Ambulatory Visit | Attending: Obstetrics & Gynecology | Admitting: Obstetrics & Gynecology

## 2017-01-16 ENCOUNTER — Ambulatory Visit: Payer: Medicare HMO | Admitting: Certified Registered"

## 2017-01-16 ENCOUNTER — Encounter
Admission: RE | Disposition: A | Discharge status: Home / Self Care | Payer: Self-pay | Source: Ambulatory Visit | Attending: Obstetrics & Gynecology

## 2017-01-16 DIAGNOSIS — Z7982 Long term (current) use of aspirin: Secondary | ICD-10-CM | POA: Diagnosis not present

## 2017-01-16 DIAGNOSIS — K219 Gastro-esophageal reflux disease without esophagitis: Secondary | ICD-10-CM | POA: Diagnosis not present

## 2017-01-16 DIAGNOSIS — N183 Chronic kidney disease, stage 3 (moderate): Secondary | ICD-10-CM | POA: Insufficient documentation

## 2017-01-16 DIAGNOSIS — I129 Hypertensive chronic kidney disease with stage 1 through stage 4 chronic kidney disease, or unspecified chronic kidney disease: Secondary | ICD-10-CM | POA: Diagnosis not present

## 2017-01-16 DIAGNOSIS — Z79899 Other long term (current) drug therapy: Secondary | ICD-10-CM | POA: Diagnosis not present

## 2017-01-16 DIAGNOSIS — Z9221 Personal history of antineoplastic chemotherapy: Secondary | ICD-10-CM | POA: Diagnosis not present

## 2017-01-16 DIAGNOSIS — E785 Hyperlipidemia, unspecified: Secondary | ICD-10-CM | POA: Insufficient documentation

## 2017-01-16 DIAGNOSIS — R05 Cough: Secondary | ICD-10-CM | POA: Diagnosis not present

## 2017-01-16 DIAGNOSIS — Z87891 Personal history of nicotine dependence: Secondary | ICD-10-CM | POA: Diagnosis not present

## 2017-01-16 DIAGNOSIS — J449 Chronic obstructive pulmonary disease, unspecified: Secondary | ICD-10-CM | POA: Diagnosis not present

## 2017-01-16 DIAGNOSIS — F329 Major depressive disorder, single episode, unspecified: Secondary | ICD-10-CM | POA: Diagnosis not present

## 2017-01-16 DIAGNOSIS — Z853 Personal history of malignant neoplasm of breast: Secondary | ICD-10-CM | POA: Insufficient documentation

## 2017-01-16 DIAGNOSIS — N950 Postmenopausal bleeding: Secondary | ICD-10-CM | POA: Insufficient documentation

## 2017-01-16 DIAGNOSIS — E876 Hypokalemia: Secondary | ICD-10-CM | POA: Diagnosis not present

## 2017-01-16 HISTORY — PX: DILATION AND CURETTAGE OF UTERUS: SHX78

## 2017-01-16 LAB — BASIC METABOLIC PANEL
Anion gap: 12 (ref 5–15)
BUN: 17 mg/dL (ref 6–20)
CO2: 23 mmol/L (ref 22–32)
CREATININE: 1 mg/dL (ref 0.44–1.00)
Calcium: 9 mg/dL (ref 8.9–10.3)
Chloride: 103 mmol/L (ref 101–111)
GFR calc Af Amer: >60 mL/min (ref >60)
GFR, EST NON AFRICAN AMERICAN: 57 mL/min — AB (ref >60)
Glucose, Bld: 95 mg/dL (ref 65–99)
Potassium: 3.9 mmol/L (ref 3.5–5.1)
SODIUM: 138 mmol/L (ref 135–145)

## 2017-01-16 LAB — CBC
HCT: 46.1 % (ref 35.0–47.0)
Hemoglobin: 15.6 g/dL (ref 12.0–16.0)
MCH: 30 pg (ref 26.0–34.0)
MCHC: 33.9 g/dL (ref 32.0–36.0)
MCV: 88.6 fL (ref 80.0–100.0)
PLATELETS: 161 10*3/uL (ref 150–440)
RBC: 5.21 MIL/uL — AB (ref 3.80–5.20)
RDW: 13.1 % (ref 11.5–14.5)
WBC: 6.9 10*3/uL (ref 3.6–11.0)

## 2017-01-16 LAB — TYPE AND SCREEN
ABO/RH(D): O POS
ANTIBODY SCREEN: NEGATIVE

## 2017-01-16 SURGERY — DILATION AND CURETTAGE
Anesthesia: General | Site: Vagina | Wound class: Clean Contaminated

## 2017-01-16 MED ORDER — FENTANYL CITRATE (PF) 100 MCG/2ML IJ SOLN
INTRAMUSCULAR | Status: AC
  Administered 2017-01-16: 50 ug via INTRAVENOUS
  Filled 2017-01-16: qty 2

## 2017-01-16 MED ORDER — FENTANYL CITRATE (PF) 100 MCG/2ML IJ SOLN
25.0000 ug | INTRAMUSCULAR | Status: DC | PRN
  Administered 2017-01-16: 50 ug via INTRAVENOUS

## 2017-01-16 MED ORDER — OXYCODONE HCL 5 MG/5ML PO SOLN: 5.0000 mg | Freq: Once | ORAL | Status: DC | PRN

## 2017-01-16 MED ORDER — MIDAZOLAM HCL 2 MG/2ML IJ SOLN
INTRAMUSCULAR | Status: DC | PRN
Start: 2017-01-16 — End: 2017-01-16
  Administered 2017-01-16: 2 mg via INTRAVENOUS

## 2017-01-16 MED ORDER — FENTANYL CITRATE (PF) 100 MCG/2ML IJ SOLN
INTRAMUSCULAR | Status: AC
  Filled 2017-01-16: qty 2

## 2017-01-16 MED ORDER — PHENYLEPHRINE HCL 10 MG/ML IJ SOLN
INTRAMUSCULAR | Status: DC | PRN
  Administered 2017-01-16: 200 ug via INTRAVENOUS

## 2017-01-16 MED ORDER — SEVOFLURANE IN SOLN
RESPIRATORY_TRACT | Status: AC
  Filled 2017-01-16: qty 250

## 2017-01-16 MED ORDER — LIDOCAINE HCL (PF) 2 % IJ SOLN
INTRAMUSCULAR | Status: AC
  Filled 2017-01-16: qty 10

## 2017-01-16 MED ORDER — ONDANSETRON HCL 4 MG/2ML IJ SOLN
INTRAMUSCULAR | Status: AC
  Filled 2017-01-16: qty 2

## 2017-01-16 MED ORDER — KETAMINE HCL 50 MG/ML IJ SOLN
INTRAMUSCULAR | Status: DC | PRN
  Administered 2017-01-16: 20 mg via INTRAMUSCULAR

## 2017-01-16 MED ORDER — DEXAMETHASONE SODIUM PHOSPHATE 10 MG/ML IJ SOLN
INTRAMUSCULAR | Status: AC
  Filled 2017-01-16: qty 1

## 2017-01-16 MED ORDER — FENTANYL CITRATE (PF) 100 MCG/2ML IJ SOLN
INTRAMUSCULAR | Status: DC | PRN
  Administered 2017-01-16: 50 ug via INTRAVENOUS

## 2017-01-16 MED ORDER — PROPOFOL 10 MG/ML IV BOLUS
INTRAVENOUS | Status: AC
  Filled 2017-01-16: qty 20

## 2017-01-16 MED ORDER — PROPOFOL 10 MG/ML IV BOLUS
INTRAVENOUS | Status: DC | PRN
  Administered 2017-01-16: 150 mg via INTRAVENOUS
  Administered 2017-01-16: 50 mg via INTRAVENOUS
  Administered 2017-01-16: 150 mg via INTRAVENOUS
  Administered 2017-01-16: 100 mg via INTRAVENOUS

## 2017-01-16 MED ORDER — LIDOCAINE HCL (PF) 1 % IJ SOLN
INTRAMUSCULAR | Status: AC
  Filled 2017-01-16: qty 30

## 2017-01-16 MED ORDER — PROMETHAZINE HCL 25 MG/ML IJ SOLN: 6.2500 mg | INTRAMUSCULAR | Status: DC | PRN

## 2017-01-16 MED ORDER — PROPOFOL 500 MG/50ML IV EMUL
INTRAVENOUS | Status: AC
  Filled 2017-01-16: qty 50

## 2017-01-16 MED ORDER — FENTANYL CITRATE (PF) 100 MCG/2ML IJ SOLN
50.0000 ug | Freq: Once | INTRAMUSCULAR | Status: AC
  Administered 2017-01-16: 50 ug via INTRAVENOUS

## 2017-01-16 MED ORDER — OXYCODONE HCL 5 MG PO TABS: 5.0000 mg | ORAL_TABLET | Freq: Once | ORAL | Status: DC | PRN

## 2017-01-16 MED ORDER — PROPOFOL 500 MG/50ML IV EMUL
INTRAVENOUS | Status: DC | PRN
Start: 2017-01-16 — End: 2017-01-16
  Administered 2017-01-16: 150 ug/kg/min via INTRAVENOUS

## 2017-01-16 MED ORDER — LACTATED RINGERS IV SOLN
INTRAVENOUS | Status: DC
  Administered 2017-01-16 (×2): via INTRAVENOUS

## 2017-01-16 MED ORDER — MEPERIDINE HCL 50 MG/ML IJ SOLN: 6.2500 mg | INTRAMUSCULAR | Status: DC | PRN

## 2017-01-16 MED ORDER — SUCCINYLCHOLINE CHLORIDE 20 MG/ML IJ SOLN
INTRAMUSCULAR | Status: DC | PRN
  Administered 2017-01-16: 150 mg via INTRAVENOUS

## 2017-01-16 MED ORDER — MIDAZOLAM HCL 2 MG/2ML IJ SOLN
INTRAMUSCULAR | Status: AC
  Filled 2017-01-16: qty 2

## 2017-01-16 MED ORDER — DEXAMETHASONE SODIUM PHOSPHATE 10 MG/ML IJ SOLN
INTRAMUSCULAR | Status: DC | PRN
  Administered 2017-01-16: 10 mg via INTRAVENOUS

## 2017-01-16 MED ORDER — GLYCOPYRROLATE 0.2 MG/ML IJ SOLN
INTRAMUSCULAR | Status: DC | PRN
  Administered 2017-01-16: 0.2 mg via INTRAVENOUS

## 2017-01-16 MED ORDER — GLYCOPYRROLATE 0.2 MG/ML IJ SOLN
INTRAMUSCULAR | Status: AC
  Filled 2017-01-16: qty 1

## 2017-01-16 MED ORDER — LIDOCAINE HCL (CARDIAC) 20 MG/ML IV SOLN
INTRAVENOUS | Status: DC | PRN
  Administered 2017-01-16: 100 mg via INTRAVENOUS

## 2017-01-16 MED ORDER — KETAMINE HCL 50 MG/ML IJ SOLN
INTRAMUSCULAR | Status: AC
  Filled 2017-01-16: qty 10

## 2017-01-16 MED ORDER — ONDANSETRON HCL 4 MG/2ML IJ SOLN
INTRAMUSCULAR | Status: DC | PRN
  Administered 2017-01-16: 4 mg via INTRAVENOUS

## 2017-01-16 SURGICAL SUPPLY — 14 items
CATH ROBINSON RED A/P 16FR (CATHETERS) ×2 IMPLANT
GLOVE PI ORTHOPRO 6.5 (GLOVE) ×1
GLOVE PI ORTHOPRO STRL 6.5 (GLOVE) ×1 IMPLANT
GLOVE SURG SYN 6.5 ES PF (GLOVE) ×2 IMPLANT
GOWN STRL REUS W/ TWL LRG LVL3 (GOWN DISPOSABLE) ×2 IMPLANT
GOWN STRL REUS W/TWL LRG LVL3 (GOWN DISPOSABLE) ×2
KIT 2% CHLOROHEXIDINE (PERSONAL CARE ITEMS) ×2 IMPLANT
KIT RM TURNOVER CYSTO AR (KITS) ×2 IMPLANT
NEEDLE SPNL 20GX3.5 QUINCKE YW (NEEDLE) ×2 IMPLANT
PACK DNC HYST (MISCELLANEOUS) ×2 IMPLANT
PAD OB MATERNITY 4.3X12.25 (PERSONAL CARE ITEMS) ×2 IMPLANT
PAD PREP 24X41 OB/GYN DISP (PERSONAL CARE ITEMS) ×2 IMPLANT
SYRINGE 10CC LL (SYRINGE) ×2 IMPLANT
TOWEL OR 17X26 4PK STRL BLUE (TOWEL DISPOSABLE) ×2 IMPLANT

## 2017-01-16 NOTE — Interval H&P Note (Signed)
History and Physical Interval Note:  01/16/2017 12:01 PM  Madison Christensen  has presented today for surgery, with the diagnosis of post menopausal bleeding, stenotic cervical OS  The various methods of treatment have been discussed with the patient and family. After consideration of risks, benefits and other options for treatment, the patient has consented to  Procedure(s): DILATATION AND CURETTAGE (N/A) as a surgical intervention .  The patient's history has been reviewed, patient examined, no change in status, stable for surgery.  I have reviewed the patient's chart and labs.  Questions were answered to the patient's satisfaction.     [place]

## 2017-01-16 NOTE — Discharge Instructions (Signed)
  AMBULATORY SURGERY  DISCHARGE INSTRUCTIONS   1) The drugs that you were given will stay in your system until tomorrow so for the next 24 hours you should not:  A) Drive an automobile B) Make any legal decisions C) Drink any alcoholic beverage   2) You may resume regular meals tomorrow.  Today it is better to start with liquids and gradually work up to solid foods.  You may eat anything you prefer, but it is better to start with liquids, then soup and crackers, and gradually work up to solid foods.   3) Please notify your doctor immediately if you have any unusual bleeding, trouble breathing, redness and pain at the surgery site, drainage, fever, or pain not relieved by medication.    4) Additional Instructions: TAKE A STOOL SOFTENER TWICE A DAY WHILE TAKING NARCOTIC PAIN MEDICINE TO PREVENT CONSTIPATION   Please contact your physician with any problems or Same Day Surgery at [phone], Monday through Friday 6 am to 4 pm, or [place] at [place] Main number at [phone].   

## 2017-01-16 NOTE — Anesthesia Procedure Notes (Addendum)
Procedure Name: Intubation Date/Time: 01/16/2017 4:36 PM Performed by: Gunnar Bulla, MD Pre-anesthesia Checklist: Patient identified, Emergency Drugs available, Suction available, Patient being monitored and Timeout performed Patient Re-evaluated:Patient Re-evaluated prior to induction Oxygen Delivery Method: Circle system utilized Preoxygenation: Pre-oxygenation with 100% oxygen Induction Type: IV induction Ventilation: Two handed mask ventilation required Laryngoscope Size: McGraph and 3 Grade View: Grade II Tube type: Oral Tube size: 7.0 mm Number of attempts: 3 Airway Equipment and Method: Stylet Placement Confirmation: ETT inserted through vocal cords under direct vision,  positive ETCO2 and breath sounds checked- equal and bilateral Secured at: 19 cm Tube secured with: Tape Dental Injury: Teeth and Oropharynx as per pre-operative assessment  Future Recommendations: Recommend- induction with short-acting agent, and alternative techniques readily available Comments: Placed LMA 4, patient continued to cough and gag even with more sedation given with oxygen saturations decreasing.  LMA removed, Attempt ETT placement with MAC 3 by Dr. Marcello Moores, he was unable to place tube, McGraph used and shoulder roll placed under patient.  ETT placed by Dr. Marcello Moores.

## 2017-01-16 NOTE — Care Management (Signed)
Unable to control with sedation. LMA not seating properly. Intubated with Mcgrath - difficult intubation. Note will be given to patient.

## 2017-01-16 NOTE — Anesthesia Preprocedure Evaluation (Signed)
Anesthesia Evaluation  Patient identified by MRN, date of birth, ID band Patient awake    Reviewed: Allergy & Precautions, NPO status , Patient's Chart, lab work & pertinent test results  History of Anesthesia Complications Negative for: history of anesthetic complications  Airway Mallampati: II  TM Distance: >3 FB Neck ROM: Full    Dental no notable dental hx.    Pulmonary neg sleep apnea, COPD,  COPD inhaler, former smoker,    breath sounds clear to auscultation- rhonchi (-) wheezing      Cardiovascular hypertension, Pt. on medications (-) CAD, (-) Past MI and (-) Cardiac Stents  Rhythm:Regular Rate:Normal - Systolic murmurs and - Diastolic murmurs    Neuro/Psych PSYCHIATRIC DISORDERS Depression    GI/Hepatic Neg liver ROS, GERD  ,  Endo/Other  negative endocrine ROSneg diabetes  Renal/GU Renal InsufficiencyRenal disease     Musculoskeletal negative musculoskeletal ROS (+)   Abdominal (+) + obese,   Peds  Hematology negative hematology ROS (+)   Anesthesia Other Findings Past Medical History: 2016: Breast cancer ([place])     Comment:  left No date: Cancer [facility])     Comment:  skin  07/30/2014: Carcinoma of left breast ([place])     Comment:  1.4 cm, T1c, N0, ER negative, PR negative, HER-2               positive 6712: Complication of anesthesia     Comment:  oversensitive to noise, lights No date: COPD (chronic obstructive pulmonary disease) ([place]) 1995: Depression No date: GERD (gastroesophageal reflux disease) No date: Hyperlipidemia No date: Hypertension   Reproductive/Obstetrics                             Anesthesia Physical Anesthesia Plan  ASA: III  Anesthesia Plan: General   Post-op Pain Management:    Induction: Intravenous  PONV Risk Score and Plan: 2 and Propofol infusion  Airway Management Planned: Natural Airway  Additional Equipment:   Intra-op Plan:    Post-operative Plan:   Informed Consent: I have reviewed the patients History and Physical, chart, labs and discussed the procedure including the risks, benefits and alternatives for the proposed anesthesia with the patient or authorized representative who has indicated his/her understanding and acceptance.   Dental advisory given  Plan Discussed with: CRNA and Anesthesiologist  Anesthesia Plan Comments: (Plan for IVA and paracervical block by surgeon)        Anesthesia Quick Evaluation

## 2017-01-16 NOTE — Anesthesia Postprocedure Evaluation (Signed)
Anesthesia Post Note  Patient: Madison Christensen  Procedure(s) Performed: DILATATION AND CURETTAGE (N/A Vagina )  Patient location during evaluation: PACU Anesthesia Type: General Level of consciousness: awake and alert Pain management: pain level controlled Vital Signs Assessment: post-procedure vital signs reviewed and stable Respiratory status: spontaneous breathing, nonlabored ventilation, respiratory function stable and patient connected to nasal cannula oxygen Cardiovascular status: blood pressure returned to baseline and stable Postop Assessment: no apparent nausea or vomiting Anesthetic complications: no     Last Vitals:  Vitals:   01/16/17 1807 01/16/17 1828  BP: (!) 151/80 (!) 153/82  Pulse: 92 88  Resp: 13 16  Temp: 36.9 C 36.7 C  SpO2: 93%     Last Pain:  Vitals:   01/16/17 1828  TempSrc: Tympanic  PainSc:                  [name] S

## 2017-01-16 NOTE — Anesthesia Post-op Follow-up Note (Signed)
Anesthesia QCDR form completed.        

## 2017-01-16 NOTE — Transfer of Care (Signed)
Immediate Anesthesia Transfer of Care Note  Patient: Madison Christensen  Procedure(s) Performed: DILATATION AND CURETTAGE (N/A Vagina )  Patient Location: PACU  Anesthesia Type:General  Level of Consciousness: awake, oriented and patient cooperative  Airway & Oxygen Therapy: Patient Spontanous Breathing and Patient connected to face mask oxygen  Post-op Assessment: Report given to RN, Post -op Vital signs reviewed and stable and Patient moving all extremities X 4  Post vital signs: Reviewed and stable  Last Vitals:  Vitals:   01/16/17 1438 01/16/17 1710  BP: 134/67 (!) 143/91  Pulse:  (!) 117  Resp: 18 17  Temp:  36.9 C  SpO2: 95% 99%    Last Pain:  Vitals:   01/16/17 1438  TempSrc:   PainSc: 4       Patients Stated Pain Goal: 2 (10/62/69 4854)  Complications: No apparent anesthesia complications

## 2017-01-19 ENCOUNTER — Encounter: Payer: Self-pay | Admitting: Obstetrics & Gynecology

## 2017-01-20 LAB — SURGICAL PATHOLOGY

## 2017-01-20 NOTE — Op Note (Addendum)
Operative Report Dilation and Curettage 01/16/2017  Patient:  Madison Christensen  66 y.o. female Preoperative diagnosis:  post menopausal bleeding, stenotic cervical OS Postoperative diagnosis:  post menopausal bleeding, stenotic cervical OS  PROCEDURE:  Procedure(s): DILATATION AND CURETTAGE (N/A) Surgeon:  Juliann Mule) and Role:    * Ellen Goris, Honor Loh, MD - Primary Anesthesia:  sedation converted to GET I/O: 651ml cryastalloid in Minimal blood loss, no urine out. Specimens:  Endometrial biopsy Complications: None Apparent Disposition:  VS stable to PACU  Findings:  Limited by body habitus Uterus, mobile,sounding to 7 cm; normal cervix, vagina, perineum.   Indication for procedure/Consents: 66 y.o. G1P0  here for scheduled surgery for the aforementioned diagnoses. Risks of surgery were discussed with the patient including but not limited to: bleeding which may require transfusion; infection which may require antibiotics; injury to uterus or surrounding organs; intrauterine scarring which may impair future fertility; need for additional procedures including laparotomy or laparoscopy; and other postoperative/anesthesia complications. Written informed consent was obtained.    Procedure Details:   The patient had taken 274mcg of buccal cytotec what was scheduled to be 2 hours before the surgery, but due to delays was 6 hours prior.  The patient was then taken to the operating room where anesthesia was administered and was found to be adequate.  After a formal and timeout was performed, she was placed in the dorsal lithotomy position and examined with the above findings. She was then prepped and draped in the sterile manner.  A speculum was then placed in the patient's vagina and a single tooth tenaculum was applied to the anterior lip of the cervix.    The uterus was unable to be sounded, as as the sound would not pass the internal os. The small pratt dilator was gently inserted, and the os was  entered.  Dilation was completed to accommodate the small curettage but it could not be inserted without resistance, even with further dilation.  Thus the biopsy pipelle was inserted and scant specimen retrieved.  This was inserted to a sound of 7cm.  The specimen was handed off to nursing.  The tenaculum was removed from the anterior lip of the cervix and the vaginal speculum was removed after noting good hemostasis. The patient tolerated the procedure well and was taken to the recovery area awake, extubated and in stable condition.  The patient will be discharged to home as per PACU criteria.  Routine postoperative instructions given. She will follow up in the clinic in two to four weeks for postoperative evaluation.  Larey Days, MD [facility] OBGYN Attending Gynecologist

## 2017-01-27 IMAGING — NM NM CARDIA MUGA REST
9 series · 41 of 41 positions shown · non-contrast
Comparison: None.

CLINICAL DATA: Breast cancer.  High risk chemotherapy.

EXAM:
NUCLEAR MEDICINE CARDIAC BLOOD POOL IMAGING (MUGA)
TECHNIQUE: Cardiac multi-gated acquisition was performed at rest following
intravenous injection of Lc-CCm labeled red blood cells.
RADIOPHARMACEUTICALS:  22.56 mCi Lc-CCm MDP in-vitro labeled red
blood cells IV

[Series 1000: 45 lao-gated (original with roi) · 3.30mm/px · 6 of 22 frames shown]
[frame 2/22]
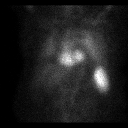
[frame 6/22]
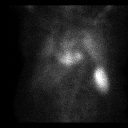
[frame 10/22]
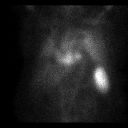
[frame 13/22]
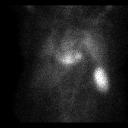
[frame 17/22]
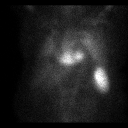
[frame 21/22]
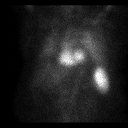

[Series 1000: 70 degree-gated · 3.30mm/px · 6 of 24 frames shown]
[frame 3/24]
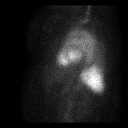
[frame 7/24]
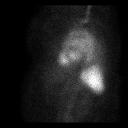
[frame 11/24]
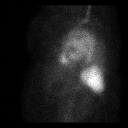
[frame 15/24]
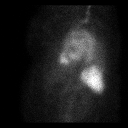
[frame 19/24]
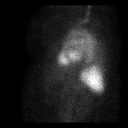
[frame 23/24]
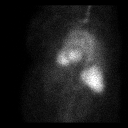

[Series 1000: 45 lao-gated (results) · 3.30mm/px · 6 of 24 frames shown]
[frame 3/24]
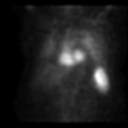
[frame 7/24]
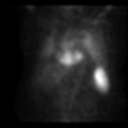
[frame 11/24]
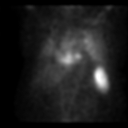
[frame 15/24]
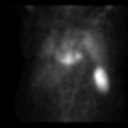
[frame 19/24]
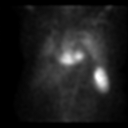
[frame 23/24]
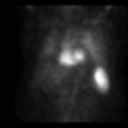

[Series 1000: 70 degree · 3.30mm/px · 1 of 1 slices shown]
[im 1/1]
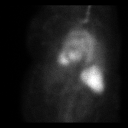

[Series 1000: 45 lao · 3.30mm/px · 1 of 1 slices shown]
[im 1/1]
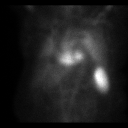

[Series 1000: 45 lao-gated (functional) · 3.30mm/px · 8 of 8 slices shown]
[im 1/8]
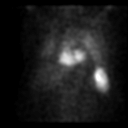
[im 2/8]
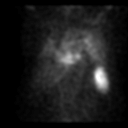
[im 3/8]
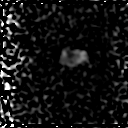
[im 4/8  full-range]
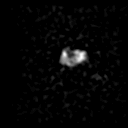
[im 5/8  full-range]
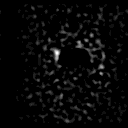
[im 6/8  full-range]
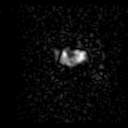
[im 7/8]
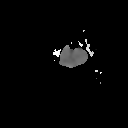
[im 8/8]
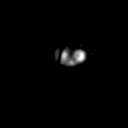

[Series 1000: anterior · 3.30mm/px · 1 of 1 slices shown]
[im 1/1]
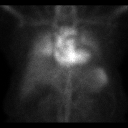

[Series 1000: anterior-gated · 3.30mm/px · 6 of 24 frames shown]
[frame 3/24]
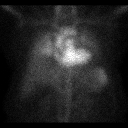
[frame 7/24]
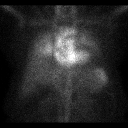
[frame 11/24]
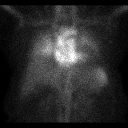
[frame 15/24]
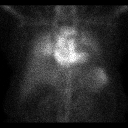
[frame 19/24]
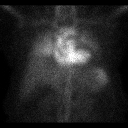
[frame 23/24]
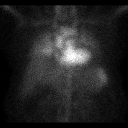

[Series 1000: 45 lao-gated · 3.30mm/px · 6 of 22 frames shown]
[frame 2/22]
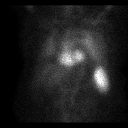
[frame 6/22]
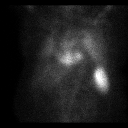
[frame 10/22]
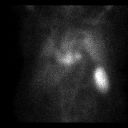
[frame 13/22]
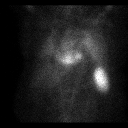
[frame 17/22]
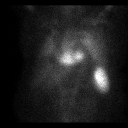
[frame 21/22]
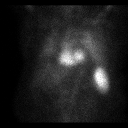

[41 of 41 positions shown; findings below may reference images not displayed]

FINDINGS: The left ventricular ejection fraction equals 62%. Normal left
ventricular wall motion.
IMPRESSION: 1. Left ventricular ejection fraction equals 62%

## 2017-02-02 ENCOUNTER — Inpatient Hospital Stay: Payer: Medicare HMO | Attending: Hematology and Oncology

## 2017-02-02 DIAGNOSIS — Z86000 Personal history of in-situ neoplasm of breast: Secondary | ICD-10-CM | POA: Insufficient documentation

## 2017-02-02 DIAGNOSIS — Z452 Encounter for adjustment and management of vascular access device: Secondary | ICD-10-CM | POA: Diagnosis not present

## 2017-02-02 DIAGNOSIS — Z95828 Presence of other vascular implants and grafts: Secondary | ICD-10-CM

## 2017-02-02 MED ORDER — HEPARIN SOD (PORK) LOCK FLUSH 100 UNIT/ML IV SOLN
500.0000 [IU] | INTRAVENOUS | Status: AC | PRN
  Administered 2017-02-02: 500 [IU]

## 2017-02-02 MED ORDER — SODIUM CHLORIDE 0.9% FLUSH
10.0000 mL | INTRAVENOUS | Status: AC | PRN
  Administered 2017-02-02: 10 mL
  Filled 2017-02-02: qty 10

## 2017-02-03 ENCOUNTER — Other Ambulatory Visit: Payer: Self-pay

## 2017-02-03 ENCOUNTER — Encounter
Admission: RE | Admit: 2017-02-03 | Discharge: 2017-02-03 | Disposition: A | Discharge status: Home / Self Care | Payer: Medicare HMO | Source: Ambulatory Visit | Attending: Obstetrics & Gynecology | Admitting: Obstetrics & Gynecology

## 2017-02-03 HISTORY — DX: Chronic kidney disease, unspecified: N18.9

## 2017-02-03 NOTE — Patient Instructions (Signed)
Your procedure is scheduled on: 02/06/17 Report to Day Surgery. Medical mall second floor To find out your arrival time please call [phone] between 1PM - 3PM on 02/05/17 Remember: Instructions that are not followed completely may result in serious medical risk, up to and including death, or upon the discretion of your surgeon and anesthesiologist your surgery may need to be rescheduled.     _X__ 1. Do not eat food after midnight the night before your procedure.                 No gum chewing or hard candies. You may drink clear liquids up to 2 hours                 before you are scheduled to arrive for your surgery- DO not drink clear                 liquids within 2 hours of the start of your surgery.                 Clear Liquids include:  water, apple juice without pulp, clear carbohydrate                 drink such as Clearfast of Gartorade, Black Coffee or Tea (Do not add                 anything to coffee or tea).     _X__ 2.  No Alcohol for 24 hours before or after surgery.   _X__ 3.  Do Not Smoke or use e-cigarettes For 24 Hours Prior to Your Surgery.                 Do not use any chewable tobacco products for at least 6 hours prior to                 surgery.  ____  4.  Bring all medications with you on the day of surgery if instructed.   _x___  5.  Notify your doctor if there is any change in your medical condition      (cold, fever, infections).     Do not wear jewelry, make-up, hairpins, clips or nail polish. Do not wear lotions, powders, or perfumes. You may wear deodorant. Do not shave 48 hours prior to surgery. Men may shave face and neck. Do not bring valuables to the hospital.    [facility] is not responsible for any belongings or valuables.  Contacts, dentures or bridgework may not be worn into surgery. Leave your suitcase in the car. After surgery it may be brought to your room. For patients admitted to the hospital, discharge time  is determined by your treatment team.   Patients discharged the day of surgery will not be allowed to drive home.    ____ Take these medicines the morning of surgery with A SIP OF WATER:    1. none  2.   3.   4.  5.  6.  ____ Fleet Enema (as directed)   ____ Use CHG Soap as directed  __x__ Use inhalers on the day of surgery  ____ Stop metformin 2 days prior to surgery    ____ Take 1/2 of usual insulin dose the night before surgery. No insulin the morning          of surgery.   ____ Stop Coumadin/Plavix/aspirin on  Do not take aspirin day of surgery ____ Stop Anti-inflammatories on  ____ Stop supplements until  after surgery.    ____ Bring C-Pap to the hospital.

## 2017-02-05 MED ORDER — CEFAZOLIN SODIUM-DEXTROSE 2-4 GM/100ML-% IV SOLN
2.0000 g | Freq: Once | INTRAVENOUS | Status: AC
  Administered 2017-02-06: 2 g via INTRAVENOUS

## 2017-02-06 ENCOUNTER — Other Ambulatory Visit: Payer: Self-pay

## 2017-02-06 ENCOUNTER — Encounter: Payer: Self-pay | Admitting: Anesthesiology

## 2017-02-06 ENCOUNTER — Ambulatory Visit: Payer: Medicare HMO | Admitting: Anesthesiology

## 2017-02-06 ENCOUNTER — Ambulatory Visit
Admission: RE | Admit: 2017-02-06 | Discharge: 2017-02-06 | Disposition: A | Discharge status: Home / Self Care | Payer: Medicare HMO | Source: Ambulatory Visit | Attending: Obstetrics & Gynecology | Admitting: Obstetrics & Gynecology

## 2017-02-06 ENCOUNTER — Encounter
Admission: RE | Disposition: A | Discharge status: Home / Self Care | Payer: Self-pay | Source: Ambulatory Visit | Attending: Obstetrics & Gynecology

## 2017-02-06 DIAGNOSIS — J449 Chronic obstructive pulmonary disease, unspecified: Secondary | ICD-10-CM | POA: Insufficient documentation

## 2017-02-06 DIAGNOSIS — C50912 Malignant neoplasm of unspecified site of left female breast: Secondary | ICD-10-CM

## 2017-02-06 DIAGNOSIS — F32A Depression, unspecified: Secondary | ICD-10-CM

## 2017-02-06 DIAGNOSIS — N72 Inflammatory disease of cervix uteri: Secondary | ICD-10-CM | POA: Diagnosis not present

## 2017-02-06 DIAGNOSIS — E785 Hyperlipidemia, unspecified: Secondary | ICD-10-CM | POA: Insufficient documentation

## 2017-02-06 DIAGNOSIS — Z853 Personal history of malignant neoplasm of breast: Secondary | ICD-10-CM | POA: Diagnosis not present

## 2017-02-06 DIAGNOSIS — N838 Other noninflammatory disorders of ovary, fallopian tube and broad ligament: Secondary | ICD-10-CM | POA: Insufficient documentation

## 2017-02-06 DIAGNOSIS — N800 Endometriosis of uterus: Secondary | ICD-10-CM | POA: Insufficient documentation

## 2017-02-06 DIAGNOSIS — I129 Hypertensive chronic kidney disease with stage 1 through stage 4 chronic kidney disease, or unspecified chronic kidney disease: Secondary | ICD-10-CM | POA: Diagnosis not present

## 2017-02-06 DIAGNOSIS — Z9221 Personal history of antineoplastic chemotherapy: Secondary | ICD-10-CM | POA: Insufficient documentation

## 2017-02-06 DIAGNOSIS — N183 Chronic kidney disease, stage 3 (moderate): Secondary | ICD-10-CM | POA: Insufficient documentation

## 2017-02-06 DIAGNOSIS — N950 Postmenopausal bleeding: Secondary | ICD-10-CM | POA: Insufficient documentation

## 2017-02-06 DIAGNOSIS — Z79899 Other long term (current) drug therapy: Secondary | ICD-10-CM | POA: Diagnosis not present

## 2017-02-06 DIAGNOSIS — D251 Intramural leiomyoma of uterus: Secondary | ICD-10-CM | POA: Diagnosis not present

## 2017-02-06 DIAGNOSIS — F329 Major depressive disorder, single episode, unspecified: Secondary | ICD-10-CM | POA: Diagnosis not present

## 2017-02-06 DIAGNOSIS — Z7982 Long term (current) use of aspirin: Secondary | ICD-10-CM | POA: Diagnosis not present

## 2017-02-06 DIAGNOSIS — Z7951 Long term (current) use of inhaled steroids: Secondary | ICD-10-CM | POA: Diagnosis not present

## 2017-02-06 DIAGNOSIS — Z87891 Personal history of nicotine dependence: Secondary | ICD-10-CM | POA: Insufficient documentation

## 2017-02-06 DIAGNOSIS — N7011 Chronic salpingitis: Secondary | ICD-10-CM | POA: Diagnosis not present

## 2017-02-06 HISTORY — PX: LAPAROSCOPIC HYSTERECTOMY: SHX1926

## 2017-02-06 HISTORY — PX: LAPAROSCOPIC BILATERAL SALPINGO OOPHERECTOMY: SHX5890

## 2017-02-06 LAB — TYPE AND SCREEN
ABO/RH(D): O POS
ANTIBODY SCREEN: NEGATIVE

## 2017-02-06 SURGERY — HYSTERECTOMY, TOTAL, LAPAROSCOPIC
Anesthesia: General

## 2017-02-06 MED ORDER — FENTANYL CITRATE (PF) 100 MCG/2ML IJ SOLN
INTRAMUSCULAR | Status: AC
  Filled 2017-02-06: qty 2

## 2017-02-06 MED ORDER — CEFAZOLIN SODIUM-DEXTROSE 2-4 GM/100ML-% IV SOLN
INTRAVENOUS | Status: AC
  Filled 2017-02-06: qty 100

## 2017-02-06 MED ORDER — LACTATED RINGERS IV SOLN
INTRAVENOUS | Status: DC | PRN
  Administered 2017-02-06: 10:00:00 via INTRAVENOUS

## 2017-02-06 MED ORDER — LIDOCAINE HCL (CARDIAC) 20 MG/ML IV SOLN
INTRAVENOUS | Status: DC | PRN
  Administered 2017-02-06: 100 mg via INTRAVENOUS

## 2017-02-06 MED ORDER — ACETAMINOPHEN-CODEINE #3 300-30 MG PO TABS
ORAL_TABLET | ORAL | Status: AC
  Administered 2017-02-06: 1 via ORAL
  Filled 2017-02-06: qty 1

## 2017-02-06 MED ORDER — GABAPENTIN 300 MG PO CAPS: 900.0000 mg | ORAL_CAPSULE | Freq: Every day | ORAL | 0 refills | Status: DC

## 2017-02-06 MED ORDER — GABAPENTIN 300 MG PO CAPS
600.0000 mg | ORAL_CAPSULE | Freq: Once | ORAL | Status: AC
  Administered 2017-02-06: 600 mg via ORAL

## 2017-02-06 MED ORDER — LIDOCAINE HCL (PF) 2 % IJ SOLN
INTRAMUSCULAR | Status: AC
  Filled 2017-02-06: qty 20

## 2017-02-06 MED ORDER — ONDANSETRON HCL 4 MG/2ML IJ SOLN
INTRAMUSCULAR | Status: AC
  Administered 2017-02-06: 4 mg via INTRAVENOUS
  Filled 2017-02-06: qty 2

## 2017-02-06 MED ORDER — FENTANYL CITRATE (PF) 100 MCG/2ML IJ SOLN
INTRAMUSCULAR | Status: AC
  Administered 2017-02-06: 25 ug via INTRAVENOUS
  Filled 2017-02-06: qty 2

## 2017-02-06 MED ORDER — CELECOXIB 200 MG PO CAPS
400.0000 mg | ORAL_CAPSULE | Freq: Once | ORAL | Status: AC
  Administered 2017-02-06: 400 mg via ORAL

## 2017-02-06 MED ORDER — HYDRALAZINE HCL 20 MG/ML IJ SOLN
INTRAMUSCULAR | Status: DC | PRN
  Administered 2017-02-06: 10 mg via INTRAVENOUS

## 2017-02-06 MED ORDER — FENTANYL CITRATE (PF) 100 MCG/2ML IJ SOLN
INTRAMUSCULAR | Status: DC | PRN
  Administered 2017-02-06: 50 ug via INTRAVENOUS
  Administered 2017-02-06: 100 ug via INTRAVENOUS
  Administered 2017-02-06: 50 ug via INTRAVENOUS

## 2017-02-06 MED ORDER — SUGAMMADEX SODIUM 200 MG/2ML IV SOLN
INTRAVENOUS | Status: DC | PRN
  Administered 2017-02-06: 250 mg via INTRAVENOUS

## 2017-02-06 MED ORDER — ACETAMINOPHEN 500 MG PO TABS
ORAL_TABLET | ORAL | Status: AC
Start: 2017-02-06 — End: 2017-02-06
  Administered 2017-02-06: 1000 mg via ORAL
  Filled 2017-02-06: qty 2

## 2017-02-06 MED ORDER — ONDANSETRON HCL 4 MG/2ML IJ SOLN
4.0000 mg | Freq: Once | INTRAMUSCULAR | Status: AC | PRN
  Administered 2017-02-06: 4 mg via INTRAVENOUS

## 2017-02-06 MED ORDER — ONDANSETRON HCL 4 MG/2ML IJ SOLN
INTRAMUSCULAR | Status: DC | PRN
  Administered 2017-02-06: 4 mg via INTRAVENOUS

## 2017-02-06 MED ORDER — LACTATED RINGERS IV SOLN
INTRAVENOUS | Status: DC
  Administered 2017-02-06: 10:00:00 via INTRAVENOUS

## 2017-02-06 MED ORDER — PROPOFOL 10 MG/ML IV BOLUS
INTRAVENOUS | Status: AC
  Filled 2017-02-06: qty 20

## 2017-02-06 MED ORDER — HEPARIN SODIUM (PORCINE) 5000 UNIT/ML IJ SOLN
INTRAMUSCULAR | Status: AC
  Administered 2017-02-06: 5000 [IU] via SUBCUTANEOUS
  Filled 2017-02-06: qty 1

## 2017-02-06 MED ORDER — CELECOXIB 200 MG PO CAPS
ORAL_CAPSULE | ORAL | Status: AC
  Administered 2017-02-06: 400 mg via ORAL
  Filled 2017-02-06: qty 2

## 2017-02-06 MED ORDER — HYDRALAZINE HCL 20 MG/ML IJ SOLN
INTRAMUSCULAR | Status: AC
  Filled 2017-02-06: qty 1

## 2017-02-06 MED ORDER — HEPARIN SODIUM (PORCINE) 5000 UNIT/ML IJ SOLN
5000.0000 [IU] | Freq: Once | INTRAMUSCULAR | Status: AC
  Administered 2017-02-06: 5000 [IU] via SUBCUTANEOUS

## 2017-02-06 MED ORDER — ONDANSETRON HCL 4 MG/2ML IJ SOLN
INTRAMUSCULAR | Status: AC
  Filled 2017-02-06: qty 2

## 2017-02-06 MED ORDER — EPHEDRINE SULFATE 50 MG/ML IJ SOLN
INTRAMUSCULAR | Status: DC | PRN
  Administered 2017-02-06 (×2): 10 mg via INTRAVENOUS

## 2017-02-06 MED ORDER — FAMOTIDINE 20 MG PO TABS
ORAL_TABLET | ORAL | Status: AC
  Administered 2017-02-06: 20 mg via ORAL
  Filled 2017-02-06: qty 1

## 2017-02-06 MED ORDER — MIDAZOLAM HCL 2 MG/2ML IJ SOLN
INTRAMUSCULAR | Status: AC
  Filled 2017-02-06: qty 2

## 2017-02-06 MED ORDER — FENTANYL CITRATE (PF) 100 MCG/2ML IJ SOLN
25.0000 ug | INTRAMUSCULAR | Status: AC | PRN
  Administered 2017-02-06 (×6): 25 ug via INTRAVENOUS

## 2017-02-06 MED ORDER — ROCURONIUM BROMIDE 100 MG/10ML IV SOLN
INTRAVENOUS | Status: DC | PRN
  Administered 2017-02-06: 50 mg via INTRAVENOUS

## 2017-02-06 MED ORDER — PROPOFOL 500 MG/50ML IV EMUL
INTRAVENOUS | Status: AC
  Filled 2017-02-06: qty 50

## 2017-02-06 MED ORDER — SUGAMMADEX SODIUM 500 MG/5ML IV SOLN
INTRAVENOUS | Status: AC
  Filled 2017-02-06: qty 5

## 2017-02-06 MED ORDER — ACETAMINOPHEN-CODEINE #3 300-30 MG PO TABS: 1.0000 | ORAL_TABLET | ORAL | 0 refills | Status: DC | PRN

## 2017-02-06 MED ORDER — ACETAMINOPHEN-CODEINE #3 300-30 MG PO TABS
1.0000 | ORAL_TABLET | ORAL | Status: DC | PRN
  Administered 2017-02-06: 1 via ORAL

## 2017-02-06 MED ORDER — SUCCINYLCHOLINE CHLORIDE 20 MG/ML IJ SOLN
INTRAMUSCULAR | Status: DC | PRN
  Administered 2017-02-06: 100 mg via INTRAVENOUS

## 2017-02-06 MED ORDER — ACETAMINOPHEN 500 MG PO TABS
1000.0000 mg | ORAL_TABLET | Freq: Once | ORAL | Status: AC
  Administered 2017-02-06: 1000 mg via ORAL

## 2017-02-06 MED ORDER — FAMOTIDINE 20 MG PO TABS
20.0000 mg | ORAL_TABLET | Freq: Once | ORAL | Status: AC
  Administered 2017-02-06: 20 mg via ORAL

## 2017-02-06 MED ORDER — GABAPENTIN 300 MG PO CAPS
ORAL_CAPSULE | ORAL | Status: AC
  Administered 2017-02-06: 600 mg via ORAL
  Filled 2017-02-06: qty 2

## 2017-02-06 MED ORDER — MIDAZOLAM HCL 2 MG/2ML IJ SOLN
INTRAMUSCULAR | Status: DC | PRN
  Administered 2017-02-06: 2 mg via INTRAVENOUS

## 2017-02-06 MED ORDER — DEXAMETHASONE SODIUM PHOSPHATE 10 MG/ML IJ SOLN
INTRAMUSCULAR | Status: DC | PRN
  Administered 2017-02-06: 10 mg via INTRAVENOUS

## 2017-02-06 MED ORDER — PROPOFOL 10 MG/ML IV BOLUS
INTRAVENOUS | Status: DC | PRN
  Administered 2017-02-06: 150 mg via INTRAVENOUS

## 2017-02-06 SURGICAL SUPPLY — 61 items
BAG URINE DRAINAGE (UROLOGICAL SUPPLIES) ×3 IMPLANT
BLADE SURG SZ11 CARB STEEL (BLADE) ×3 IMPLANT
CANISTER SUCT 1200ML W/VALVE (MISCELLANEOUS) ×3 IMPLANT
CATH FOLEY 2WAY  5CC 16FR (CATHETERS) ×1
CATH ROBINSON RED A/P 16FR (CATHETERS) ×3 IMPLANT
CATH URTH 16FR FL 2W BLN LF (CATHETERS) ×2 IMPLANT
CHLORAPREP W/TINT 26ML (MISCELLANEOUS) ×6 IMPLANT
DEFOGGER SCOPE WARMER CLEARIFY (MISCELLANEOUS) ×3 IMPLANT
DERMABOND ADVANCED (GAUZE/BANDAGES/DRESSINGS) ×1
DERMABOND ADVANCED .7 DNX12 (GAUZE/BANDAGES/DRESSINGS) ×2 IMPLANT
DRAPE LEGGINS SURG 28X43 STRL (DRAPES) ×3 IMPLANT
DRAPE SHEET LG 3/4 BI-LAMINATE (DRAPES) ×3 IMPLANT
DRAPE UNDER BUTTOCK W/FLU (DRAPES) ×3 IMPLANT
DRSG TELFA 4X3 1S NADH ST (GAUZE/BANDAGES/DRESSINGS) ×9 IMPLANT
ELECT REM PT RETURN 9FT ADLT (ELECTROSURGICAL) ×3
ELECTRODE REM PT RTRN 9FT ADLT (ELECTROSURGICAL) ×2 IMPLANT
GLOVE PI ORTHOPRO 6.5 (GLOVE) ×1
GLOVE PI ORTHOPRO STRL 6.5 (GLOVE) ×2 IMPLANT
GLOVE SURG SYN 6.5 ES PF (GLOVE) ×9 IMPLANT
GOWN STRL REUS W/ TWL LRG LVL3 (GOWN DISPOSABLE) ×6 IMPLANT
GOWN STRL REUS W/ TWL XL LVL3 (GOWN DISPOSABLE) ×2 IMPLANT
GOWN STRL REUS W/TWL LRG LVL3 (GOWN DISPOSABLE) ×3
GOWN STRL REUS W/TWL XL LVL3 (GOWN DISPOSABLE) ×1
GRASPER SUT TROCAR 14GX15 (MISCELLANEOUS) ×3 IMPLANT
IRRIGATION STRYKERFLOW (MISCELLANEOUS) ×2 IMPLANT
IRRIGATOR STRYKERFLOW (MISCELLANEOUS) ×3
IV LACTATED RINGERS 1000ML (IV SOLUTION) ×3 IMPLANT
KIT PINK PAD W/HEAD ARE REST (MISCELLANEOUS) ×3
KIT PINK PAD W/HEAD ARM REST (MISCELLANEOUS) ×2 IMPLANT
KIT RM TURNOVER CYSTO AR (KITS) ×3 IMPLANT
L-HOOK LAP DISP 36CM (ELECTROSURGICAL) ×3
LABEL OR SOLS (LABEL) IMPLANT
LHOOK LAP DISP 36CM (ELECTROSURGICAL) ×2 IMPLANT
LIGASURE VESSEL 5MM BLUNT TIP (ELECTROSURGICAL) ×3 IMPLANT
MANIPULATOR UTERINE 4.5 ZUMI (MISCELLANEOUS) IMPLANT
MANIPULATOR VCARE LG CRV RETR (MISCELLANEOUS) IMPLANT
MANIPULATOR VCARE SML CRV RETR (MISCELLANEOUS) IMPLANT
MANIPULATOR VCARE STD CRV RETR (MISCELLANEOUS) ×3 IMPLANT
NS IRRIG 500ML POUR BTL (IV SOLUTION) ×3 IMPLANT
PACK LAP CHOLECYSTECTOMY (MISCELLANEOUS) ×3 IMPLANT
PAD OB MATERNITY 4.3X12.25 (PERSONAL CARE ITEMS) ×3 IMPLANT
PAD PREP 24X41 OB/GYN DISP (PERSONAL CARE ITEMS) ×3 IMPLANT
PENCIL ELECTRO HAND CTR (MISCELLANEOUS) ×3 IMPLANT
POUCH SPECIMEN RETRIEVAL 10MM (ENDOMECHANICALS) IMPLANT
SCISSORS METZENBAUM CVD 33 (INSTRUMENTS) ×3 IMPLANT
SCRUB CHG 4% DYNA-HEX 4OZ (MISCELLANEOUS) ×3 IMPLANT
SET CYSTO W/LG BORE CLAMP LF (SET/KITS/TRAYS/PACK) IMPLANT
SET YANKAUER POOLE SUCT (MISCELLANEOUS) ×3 IMPLANT
SLEEVE ENDOPATH XCEL 5M (ENDOMECHANICALS) ×6 IMPLANT
SURGILUBE 2OZ TUBE FLIPTOP (MISCELLANEOUS) ×3 IMPLANT
SUT MNCRL 4-0 (SUTURE) ×1
SUT MNCRL 4-0 27XMFL (SUTURE) ×2
SUT MNCRL AB 4-0 PS2 18 (SUTURE) ×3 IMPLANT
SUT VIC AB 0 CT1 36 (SUTURE) ×6 IMPLANT
SUT VIC AB 2-0 UR6 27 (SUTURE) ×3 IMPLANT
SUTURE MNCRL 4-0 27XMF (SUTURE) ×2 IMPLANT
SYR 10ML LL (SYRINGE) ×3 IMPLANT
TROCAR ENDO BLADELESS 11MM (ENDOMECHANICALS) IMPLANT
TROCAR XCEL NON-BLD 5MMX100MML (ENDOMECHANICALS) ×3 IMPLANT
TUBING INSUF HEATED (TUBING) ×3 IMPLANT
TUBING INSUFFLATION (TUBING) ×3 IMPLANT

## 2017-02-06 NOTE — Discharge Instructions (Signed)
Discharge instructions:  Call office if you have any of the following: fever >101 F, chills, excessive vaginal bleeding, incision drainage or problems, leg pain or redness, or any other concerns.   Activity: Do not lift > 10 lbs for 8 weeks.  No intercourse or tampons for 8 weeks.  No driving for 1-2 weeks.   You may feel some pain in your upper right abdomen/rib and right shoulder.  This is from the gas in the abdomen for surgery. This will subside over time, please be patient!  Take 600mg  Ibuprofen and 1000mg  Tylenol around the clock, every 6 hours for at least the first 3-5 days.  After this you can take as needed.  This will help decrease inflammation and promote healing.  The narcotics you'll take just as needed, as they just trick your brain into thinking its not in pain.    Please don't limit yourself in terms of routine activity.  You will be able to do most things, although they may take longer to do or be a little painful.  You can do it!  Don't be a hero, but don't be a wimp either!   AMBULATORY SURGERY  DISCHARGE INSTRUCTIONS   1) The drugs that you were given will stay in your system until tomorrow so for the next 24 hours you should not:  A) Drive an automobile B) Make any legal decisions C) Drink any alcoholic beverage   2) You may resume regular meals tomorrow.  Today it is better to start with liquids and gradually work up to solid foods.  You may eat anything you prefer, but it is better to start with liquids, then soup and crackers, and gradually work up to solid foods.   3) Please notify your doctor immediately if you have any unusual bleeding, trouble breathing, redness and pain at the surgery site, drainage, fever, or pain not relieved by medication.    4) Additional Instructions: TAKE A STOOL SOFTENER TWICE A DAY WHILE TAKING NARCOTIC PAIN MEDICINE TO PREVENT CONSTIPATION   Please contact your physician with any problems or Same Day Surgery at  [phone], Monday through Friday 6 am to 4 pm, or [place] at [facility] number at [phone].

## 2017-02-06 NOTE — Op Note (Signed)
Total Laparoscopic Hysterectomy Operative Note Procedure Date: 02/06/2017  Patient:  Madison Christensen  66 y.o. female  PRE-OPERATIVE DIAGNOSIS:  PMB  Adnexal Mass  POST-OPERATIVE DIAGNOSIS:  PMB  Adnexal Mass  PROCEDURE:  Procedure(s): HYSTERECTOMY TOTAL LAPAROSCOPIC (N/A) LAPAROSCOPIC BILATERAL SALPINGO OOPHORECTOMY (Bilateral)  SURGEON:  Surgeon(s) and Role:    * Marshaun Lortie, Honor Loh, MD - Primary Assist: Benjaman Kindler, MD  ANESTHESIA:  General via ET  I/O  Total I/O In: 700 [I.V.:700] Out: 350 [Urine:300; Blood:50]  FINDINGS:  Small uterus with left anterior/lateral fibroid, normal ovaries and grossly dilated fallopian tubes bilaterally.  Normal upper abdomen.  SPECIMEN: Uterus, Cervix, bilateral fallopian tubes and ovaries.  COMPLICATIONS: none apparent  DISPOSITION: vital signs stable to PACU  Indication for Surgery: 66 y.o. who presented with postmenopausal bleeding and pelvic pain, found to have hydrosalpinx/bilateral adnexal masses on ultrasound. Endometrial biopsy was obtained and scant tissue was negative for malignancy.  Risks of surgery were discussed with the patient including but not limited to: bleeding which may require transfusion or reoperation; infection which may require antibiotics; injury to bowel, bladder, ureters or other surrounding organs; need for additional procedures including laparotomy, blood clot, incisional problems and other postoperative/anesthesia complications. Written informed consent was obtained.     PROCEDURE IN DETAIL:  The patient had 5000u Heparin Sub-q and sequential compression devices applied to her lower extremities while in the preoperative area.  She was then taken to the operating room. IV antibiotics were given. General anesthesia was administered via endotracheal route.  She was placed in the dorsal lithotomy position, and was prepped and draped in a sterile manner. A surgical time-out was performed.  A Foley catheter was  inserted into her bladder and attached to constant drainage and a V-Care uterine manipulator was then advanced into the uterus and a good fit around the cervix was noted. The gloves were changed, and attention was turned to the abdomen where an umbilical incision was made with the scalpel.  An 64mm trochar was inserted in the umbilical incision using a visiport method.Opening pressure was 39mmHg, and the abdomen was insufflated to 15mg Hg carbon dioxide gas and adequate pneumoperitoneum was obtained. A survey of the patient's pelvis and abdomen revealed the findings as mentioned above. Two 84mm ports were inserted in the lower left and right quadrants under visualization.    The bilateral round ligaments were divided and the surrounding peritoneum divided and deviated medially to expose the ureters.  The IP ligaments were isolated and thrice cauterized, then divided.  The anterior broad ligament divided and brought across the uterus to separate the vesicouterine peritoneum and create a bladder flap. The bladder was pushed away from the uterus. The bilateral uterine arteries were skeletonized, ligated and transected. The bilateral uterosacral and cardinal ligaments were ligated and transected. A colpotomy was made around the V-Care cervical cup and the uterus, cervix, and bilateral tubes were removed through the vagina. The vaginal cuff was closed with the endostitch device and v-lock in a running stitch. This was tested for integrity using the surgeon's finger. After a change of gloves, the pneumoperitoneum was recreated and surgical site inspected, and found to be hemostatic. Bilateral ureters were visualized vermiuclating. No intraoperative injury to surrounding organs was noted. The 26mm port site was closed with 2-0 vicryl using the inlet closure device.  The abdomen was desufflated and all instruments were then removed.   All skin incisions were closed with 4-0 monocryl and covered with surgical  glue. The patient tolerated the  procedures well.  All instruments, needles, and sponge counts were correct x 2. The patient was taken to the recovery room in stable condition.   ---- Larey Days, MD Attending Obstetrician and [facility]

## 2017-02-06 NOTE — Anesthesia Procedure Notes (Signed)
Procedure Name: Intubation Date/Time: 02/06/2017 10:12 AM Performed by: Leander Rams, CRNA Pre-anesthesia Checklist: Patient identified, Emergency Drugs available, Suction available and Patient being monitored Patient Re-evaluated:Patient Re-evaluated prior to induction Oxygen Delivery Method: Circle system utilized Preoxygenation: Pre-oxygenation with 100% oxygen Induction Type: IV induction Ventilation: Mask ventilation without difficulty Laryngoscope Size: Glidescope and 4 Grade View: Grade I Tube type: Oral Tube size: 7.0 mm Number of attempts: 1 Airway Equipment and Method: Patient positioned with wedge pillow,  Stylet and Video-laryngoscopy Placement Confirmation: ETT inserted through vocal cords under direct vision,  positive ETCO2 and breath sounds checked- equal and bilateral Secured at: 22 cm Tube secured with: Tape Dental Injury: Teeth and Oropharynx as per pre-operative assessment

## 2017-02-06 NOTE — Transfer of Care (Signed)
Immediate Anesthesia Transfer of Care Note  Patient: Madison Christensen  Procedure(s) Performed: HYSTERECTOMY TOTAL LAPAROSCOPIC (N/A ) LAPAROSCOPIC BILATERAL SALPINGO OOPHORECTOMY (Bilateral )  Patient Location: PACU  Anesthesia Type:General  Level of Consciousness: awake  Airway & Oxygen Therapy: Patient Spontanous Breathing  Post-op Assessment: Report given to RN  Post vital signs: stable  Last Vitals:  Vitals:   02/06/17 0913 02/06/17 1206  BP: 124/74 124/61  Pulse: 73 88  Resp: 16   Temp: 36.9 C (!) 36.2 C  SpO2: 100%     Last Pain:  Vitals:   02/06/17 0913  TempSrc: Oral         Complications: No apparent anesthesia complications

## 2017-02-06 NOTE — Interval H&P Note (Signed)
History and Physical Interval Note:  02/06/2017 9:21 AM  Madison Christensen  has presented today for surgery, with the diagnosis of PMB  Adnexal Mass  The various methods of treatment have been discussed with the patient and family. After consideration of risks, benefits and other options for treatment, the patient has consented to  Procedure(s): HYSTERECTOMY TOTAL LAPAROSCOPIC (N/A) LAPAROSCOPIC BILATERAL SALPINGO OOPHORECTOMY (Bilateral) as a surgical intervention .  The patient's history has been reviewed, patient examined, no change in status, stable for surgery.  I have reviewed the patient's chart and labs.  Questions were answered to the patient's satisfaction.    BP 124/74   Pulse 73   Temp 98.5 F (36.9 C) (Oral)   Resp 16   SpO2 100%    Ura Hausen C Jaydin Jalomo

## 2017-02-06 NOTE — Anesthesia Preprocedure Evaluation (Signed)
Anesthesia Evaluation  Patient identified by MRN, date of birth, ID band Patient awake    Reviewed: Allergy & Precautions, H&P , NPO status , Patient's Chart, lab work & pertinent test results, reviewed documented beta blocker date and time   History of Anesthesia Complications (+) history of anesthetic complications  Airway Mallampati: III  TM Distance: >3 FB Neck ROM: full    Dental  (+) Teeth Intact   Pulmonary neg pulmonary ROS, COPD, former smoker,    Pulmonary exam normal        Cardiovascular Exercise Tolerance: Good hypertension, On Medications negative cardio ROS Normal cardiovascular exam Rhythm:regular Rate:Normal     Neuro/Psych PSYCHIATRIC DISORDERS negative neurological ROS  negative psych ROS   GI/Hepatic negative GI ROS, Neg liver ROS, GERD  Medicated,  Endo/Other  negative endocrine ROS  Renal/GU CRFRenal diseasenegative Renal ROS  negative genitourinary   Musculoskeletal   Abdominal   Peds  Hematology negative hematology ROS (+)   Anesthesia Other Findings Past Medical History: 2016: Breast cancer ([place])     Comment:  left No date: Cancer [facility])     Comment:  skin  07/30/2014: Carcinoma of left breast ([place])     Comment:  1.4 cm, T1c, N0, ER negative, PR negative, HER-2               positive No date: Chronic kidney disease     Comment:  h/o renal insuff 2992: Complication of anesthesia     Comment:  oversensitive to noise, lights No date: COPD (chronic obstructive pulmonary disease) ([place]) 1995: Depression No date: GERD (gastroesophageal reflux disease) No date: Hyperlipidemia No date: Hypertension Past Surgical History: 08/03/2014: AXILLARY LYMPH NODE BIOPSY; Left     Comment:  Procedure: AXILLARY LYMPH NODE BIOPSY;  Surgeon: Robert Bellow, MD;  Location: [facility] ORS;  Service: General;                Laterality: Left; No date: BACK SURGERY No date: BREAST BIOPSY;  Left 2016: BREAST LUMPECTOMY; Left 08/03/2014: BREAST LUMPECTOMY WITH AXILLARY LYMPH NODE DISSECTION; Left     Comment:  Procedure: BREAST LUMPECTOMY WITH AXILLARY LYMPH NODE               DISSECTION;  Surgeon: Robert Bellow, MD;  Location:               [facility] ORS;  Service: General;  Laterality: Left; Aug 03, 2014: BREAST SURGERY; Left     Comment:  Wide excision, sentinel node biopsy. 2013: COLONOSCOPY     Comment:  Dr. Vira Agar 05/30/2015: COLONOSCOPY WITH PROPOFOL; N/A     Comment:  Procedure: COLONOSCOPY WITH PROPOFOL;  Surgeon: Manya Silvas, MD;  Location: [facility] ENDOSCOPY;  Service:               Endoscopy;  Laterality: N/A; 01/16/2017: DILATION AND CURETTAGE OF UTERUS; N/A     Comment:  Procedure: DILATATION AND CURETTAGE;  Surgeon: Ward,               Honor Loh, MD;  Location: [facility] ORS;  Service: Gynecology;              Laterality: N/A; 08/31/2014: PORTACATH PLACEMENT; Right     Comment:  Procedure: INSERTION PORT-A-CATH;  Surgeon: Forest Gleason  Bary Castilla, MD;  Location: [facility] ORS;  Service: General;                Laterality: Right; 2016: REDUCTION MAMMAPLASTY; Bilateral 08/03/2014: SENTINEL NODE BIOPSY; Left     Comment:  Procedure: SENTINEL NODE BIOPSY;  Surgeon: Robert Bellow, MD;  Location: [facility] ORS;  Service: General;                Laterality: Left; 2015: skin cancer  removal     Comment:  nose  No date: TONSILLECTOMY   Reproductive/Obstetrics negative OB ROS                             Anesthesia Physical Anesthesia Plan  ASA: III  Anesthesia Plan: General ETT   Post-op Pain Management:    Induction:   PONV Risk Score and Plan: 4 or greater  Airway Management Planned:   Additional Equipment:   Intra-op Plan:   Post-operative Plan:   Informed Consent: I have reviewed the patients History and Physical, chart, labs and discussed the procedure including the risks, benefits and  alternatives for the proposed anesthesia with the patient or authorized representative who has indicated his/her understanding and acceptance.   Dental Advisory Given  Plan Discussed with: CRNA  Anesthesia Plan Comments:         Anesthesia Quick Evaluation

## 2017-02-06 NOTE — Anesthesia Post-op Follow-up Note (Signed)
Anesthesia QCDR form completed.        

## 2017-02-06 NOTE — OR Nursing (Signed)
Dr. Leonides Schanz notified that patient's type and screen has just been drawn. She reports no need to wait for results before proceeding to surgery.

## 2017-02-10 LAB — SURGICAL PATHOLOGY

## 2017-02-12 NOTE — Anesthesia Postprocedure Evaluation (Signed)
Anesthesia Post Note  Patient: Madison Christensen  Procedure(s) Performed: HYSTERECTOMY TOTAL LAPAROSCOPIC (N/A ) LAPAROSCOPIC BILATERAL SALPINGO OOPHORECTOMY (Bilateral )  Anesthesia Type: General     Last Vitals:  Vitals:   02/06/17 1428 02/06/17 1500  BP:  (!) 115/56  Pulse: 93 89  Resp:  16  Temp:    SpO2:  96%    Last Pain:  Vitals:   02/09/17 0814  TempSrc:   PainSc: 2                  Molli Barrows

## 2017-02-19 ENCOUNTER — Emergency Department: Payer: Medicare HMO

## 2017-02-19 ENCOUNTER — Other Ambulatory Visit: Payer: Self-pay

## 2017-02-19 ENCOUNTER — Observation Stay
Admission: EM | Admit: 2017-02-19 | Discharge: 2017-02-21 | Disposition: A | Discharge status: Home / Self Care | Payer: Medicare HMO | Attending: Obstetrics and Gynecology | Admitting: Obstetrics and Gynecology

## 2017-02-19 ENCOUNTER — Observation Stay: Payer: Medicare HMO

## 2017-02-19 DIAGNOSIS — R531 Weakness: Secondary | ICD-10-CM | POA: Diagnosis not present

## 2017-02-19 DIAGNOSIS — K5900 Constipation, unspecified: Secondary | ICD-10-CM

## 2017-02-19 DIAGNOSIS — N390 Urinary tract infection, site not specified: Secondary | ICD-10-CM

## 2017-02-19 DIAGNOSIS — G620 Drug-induced polyneuropathy: Secondary | ICD-10-CM | POA: Insufficient documentation

## 2017-02-19 DIAGNOSIS — Z7982 Long term (current) use of aspirin: Secondary | ICD-10-CM | POA: Diagnosis not present

## 2017-02-19 DIAGNOSIS — R109 Unspecified abdominal pain: Secondary | ICD-10-CM | POA: Diagnosis not present

## 2017-02-19 DIAGNOSIS — E785 Hyperlipidemia, unspecified: Secondary | ICD-10-CM | POA: Insufficient documentation

## 2017-02-19 DIAGNOSIS — Z87891 Personal history of nicotine dependence: Secondary | ICD-10-CM | POA: Insufficient documentation

## 2017-02-19 DIAGNOSIS — Z90710 Acquired absence of both cervix and uterus: Secondary | ICD-10-CM | POA: Insufficient documentation

## 2017-02-19 DIAGNOSIS — J449 Chronic obstructive pulmonary disease, unspecified: Secondary | ICD-10-CM | POA: Diagnosis not present

## 2017-02-19 DIAGNOSIS — R110 Nausea: Secondary | ICD-10-CM | POA: Diagnosis not present

## 2017-02-19 DIAGNOSIS — A419 Sepsis, unspecified organism: Secondary | ICD-10-CM

## 2017-02-19 DIAGNOSIS — C50912 Malignant neoplasm of unspecified site of left female breast: Secondary | ICD-10-CM

## 2017-02-19 DIAGNOSIS — N12 Tubulo-interstitial nephritis, not specified as acute or chronic: Secondary | ICD-10-CM | POA: Diagnosis present

## 2017-02-19 DIAGNOSIS — Z853 Personal history of malignant neoplasm of breast: Secondary | ICD-10-CM | POA: Insufficient documentation

## 2017-02-19 DIAGNOSIS — Z881 Allergy status to other antibiotic agents status: Secondary | ICD-10-CM | POA: Diagnosis not present

## 2017-02-19 DIAGNOSIS — Z79899 Other long term (current) drug therapy: Secondary | ICD-10-CM | POA: Diagnosis not present

## 2017-02-19 DIAGNOSIS — F32A Depression, unspecified: Secondary | ICD-10-CM

## 2017-02-19 DIAGNOSIS — F329 Major depressive disorder, single episode, unspecified: Secondary | ICD-10-CM

## 2017-02-19 DIAGNOSIS — Z9221 Personal history of antineoplastic chemotherapy: Secondary | ICD-10-CM | POA: Insufficient documentation

## 2017-02-19 DIAGNOSIS — N183 Chronic kidney disease, stage 3 (moderate): Secondary | ICD-10-CM | POA: Insufficient documentation

## 2017-02-19 DIAGNOSIS — I129 Hypertensive chronic kidney disease with stage 1 through stage 4 chronic kidney disease, or unspecified chronic kidney disease: Secondary | ICD-10-CM | POA: Diagnosis not present

## 2017-02-19 DIAGNOSIS — Z882 Allergy status to sulfonamides status: Secondary | ICD-10-CM | POA: Diagnosis not present

## 2017-02-19 LAB — URINALYSIS, COMPLETE (UACMP) WITH MICROSCOPIC
BACTERIA UA: NONE SEEN
Bilirubin Urine: NEGATIVE
GLUCOSE, UA: NEGATIVE mg/dL
Ketones, ur: NEGATIVE mg/dL
NITRITE: NEGATIVE
PH: 6 (ref 5.0–8.0)
PROTEIN: 30 mg/dL — AB
Specific Gravity, Urine: 1.012 (ref 1.005–1.030)

## 2017-02-19 LAB — COMPREHENSIVE METABOLIC PANEL
ALBUMIN: 4.1 g/dL (ref 3.5–5.0)
ALT: 16 U/L (ref 14–54)
ANION GAP: 9 (ref 5–15)
AST: 22 U/L (ref 15–41)
Alkaline Phosphatase: 54 U/L (ref 38–126)
BILIRUBIN TOTAL: 2.2 mg/dL — AB (ref 0.3–1.2)
BUN: 15 mg/dL (ref 6–20)
CALCIUM: 9.1 mg/dL (ref 8.9–10.3)
CO2: 24 mmol/L (ref 22–32)
Chloride: 103 mmol/L (ref 101–111)
Creatinine, Ser: 1.04 mg/dL — ABNORMAL HIGH (ref 0.44–1.00)
GFR calc non Af Amer: 55 mL/min — ABNORMAL LOW (ref >60)
Glucose, Bld: 135 mg/dL — ABNORMAL HIGH (ref 65–99)
POTASSIUM: 4 mmol/L (ref 3.5–5.1)
Sodium: 136 mmol/L (ref 135–145)
TOTAL PROTEIN: 7.9 g/dL (ref 6.5–8.1)

## 2017-02-19 LAB — CBC
HEMATOCRIT: 45 % (ref 35.0–47.0)
HEMOGLOBIN: 15 g/dL (ref 12.0–16.0)
MCH: 30.1 pg (ref 26.0–34.0)
MCHC: 33.5 g/dL (ref 32.0–36.0)
MCV: 90 fL (ref 80.0–100.0)
Platelets: 189 10*3/uL (ref 150–440)
RBC: 5 MIL/uL (ref 3.80–5.20)
RDW: 13.1 % (ref 11.5–14.5)
WBC: 17.2 10*3/uL — AB (ref 3.6–11.0)

## 2017-02-19 LAB — LACTIC ACID, PLASMA: Lactic Acid, Venous: 1 mmol/L (ref 0.5–1.9)

## 2017-02-19 LAB — LIPASE, BLOOD: Lipase: 30 U/L (ref 11–51)

## 2017-02-19 MED ORDER — HYDROMORPHONE HCL 1 MG/ML IJ SOLN: 0.2000 mg | INTRAMUSCULAR | Status: DC | PRN

## 2017-02-19 MED ORDER — OXYCODONE-ACETAMINOPHEN 5-325 MG PO TABS
1.0000 | ORAL_TABLET | ORAL | Status: DC | PRN
  Administered 2017-02-20: 1 via ORAL
  Filled 2017-02-19: qty 1

## 2017-02-19 MED ORDER — VANCOMYCIN HCL IN DEXTROSE 1-5 GM/200ML-% IV SOLN
1000.0000 mg | Freq: Two times a day (BID) | INTRAVENOUS | Status: DC
  Administered 2017-02-19 – 2017-02-20 (×2): 1000 mg via INTRAVENOUS
  Filled 2017-02-19 (×4): qty 200

## 2017-02-19 MED ORDER — SODIUM CHLORIDE 0.9 % IV BOLUS (SEPSIS)
1000.0000 mL | Freq: Once | INTRAVENOUS | Status: AC
  Administered 2017-02-19: 1000 mL via INTRAVENOUS

## 2017-02-19 MED ORDER — ONDANSETRON 4 MG PO TBDP
4.0000 mg | ORAL_TABLET | Freq: Four times a day (QID) | ORAL | Status: DC | PRN
  Filled 2017-02-19: qty 1

## 2017-02-19 MED ORDER — MAGNESIUM HYDROXIDE 400 MG/5ML PO SUSP
30.0000 mL | Freq: Every day | ORAL | Status: DC | PRN
  Filled 2017-02-19: qty 30

## 2017-02-19 MED ORDER — ONDANSETRON HCL 4 MG/2ML IJ SOLN
4.0000 mg | Freq: Four times a day (QID) | INTRAMUSCULAR | Status: DC | PRN
  Administered 2017-02-19: 4 mg via INTRAVENOUS
  Filled 2017-02-19: qty 2

## 2017-02-19 MED ORDER — ZOLPIDEM TARTRATE 5 MG PO TABS: 5.0000 mg | ORAL_TABLET | Freq: Every evening | ORAL | Status: DC | PRN

## 2017-02-19 MED ORDER — MAGNESIUM CITRATE PO SOLN
1.0000 | Freq: Once | ORAL | Status: DC | PRN
  Filled 2017-02-19: qty 296

## 2017-02-19 MED ORDER — ONDANSETRON HCL 4 MG PO TABS
4.0000 mg | ORAL_TABLET | Freq: Four times a day (QID) | ORAL | Status: DC | PRN
  Administered 2017-02-21: 17:00:00 4 mg via ORAL
  Filled 2017-02-19: qty 1

## 2017-02-19 MED ORDER — IBUPROFEN 400 MG PO TABS
600.0000 mg | ORAL_TABLET | Freq: Four times a day (QID) | ORAL | Status: DC | PRN
  Filled 2017-02-19 (×2): qty 2

## 2017-02-19 MED ORDER — SODIUM CHLORIDE 0.9 % IV SOLN
25.0000 mg | Freq: Once | INTRAVENOUS | Status: DC
  Filled 2017-02-19: qty 1

## 2017-02-19 MED ORDER — CEFTRIAXONE SODIUM IN DEXTROSE 20 MG/ML IV SOLN: 1.0000 g | Freq: Once | INTRAVENOUS | Status: DC

## 2017-02-19 MED ORDER — DOCUSATE SODIUM 100 MG PO CAPS
100.0000 mg | ORAL_CAPSULE | Freq: Two times a day (BID) | ORAL | Status: DC
  Administered 2017-02-19 – 2017-02-21 (×4): 100 mg via ORAL
  Filled 2017-02-19 (×4): qty 1

## 2017-02-19 MED ORDER — PROMETHAZINE HCL 25 MG/ML IJ SOLN
12.5000 mg | Freq: Four times a day (QID) | INTRAMUSCULAR | Status: DC | PRN
  Administered 2017-02-19 – 2017-02-21 (×4): 12.5 mg via INTRAVENOUS
  Filled 2017-02-19 (×4): qty 1

## 2017-02-19 MED ORDER — ALUM & MAG HYDROXIDE-SIMETH 200-200-20 MG/5ML PO SUSP: 30.0000 mL | ORAL | Status: DC | PRN

## 2017-02-19 MED ORDER — ONDANSETRON HCL 4 MG/2ML IJ SOLN
4.0000 mg | Freq: Once | INTRAMUSCULAR | Status: AC
  Administered 2017-02-19: 4 mg via INTRAVENOUS
  Filled 2017-02-19: qty 2

## 2017-02-19 MED ORDER — PIPERACILLIN-TAZOBACTAM 3.375 G IVPB
3.3750 g | Freq: Four times a day (QID) | INTRAVENOUS | Status: DC
  Administered 2017-02-19 – 2017-02-20 (×5): 3.375 g via INTRAVENOUS
  Filled 2017-02-19 (×5): qty 50

## 2017-02-19 MED ORDER — PIPERACILLIN-TAZOBACTAM 3.375 G IVPB 30 MIN
3.3750 g | Freq: Once | INTRAVENOUS | Status: AC
  Administered 2017-02-19: 3.375 g via INTRAVENOUS
  Filled 2017-02-19: qty 50

## 2017-02-19 MED ORDER — BISACODYL 5 MG PO TBEC
5.0000 mg | DELAYED_RELEASE_TABLET | Freq: Every day | ORAL | Status: DC | PRN
  Administered 2017-02-19 – 2017-02-21 (×2): 5 mg via ORAL
  Filled 2017-02-19 (×2): qty 1

## 2017-02-19 MED ORDER — IOPAMIDOL (ISOVUE-300) INJECTION 61%
100.0000 mL | Freq: Once | INTRAVENOUS | Status: AC | PRN
  Administered 2017-02-19: 100 mL via INTRAVENOUS

## 2017-02-19 MED ORDER — SORBITOL 70 % SOLN
960.0000 mL | TOPICAL_OIL | Freq: Once | ORAL | Status: DC
  Filled 2017-02-19: qty 473

## 2017-02-19 MED ORDER — LACTATED RINGERS IV SOLN
INTRAVENOUS | Status: DC
  Administered 2017-02-19: 12:00:00 via INTRAVENOUS

## 2017-02-19 MED ORDER — VANCOMYCIN HCL IN DEXTROSE 1-5 GM/200ML-% IV SOLN
1000.0000 mg | Freq: Once | INTRAVENOUS | Status: AC
  Administered 2017-02-19: 1000 mg via INTRAVENOUS
  Filled 2017-02-19: qty 200

## 2017-02-19 MED ORDER — FENTANYL CITRATE (PF) 100 MCG/2ML IJ SOLN
25.0000 ug | Freq: Once | INTRAMUSCULAR | Status: AC
  Administered 2017-02-19: 25 ug via INTRAVENOUS
  Filled 2017-02-19: qty 2

## 2017-02-19 NOTE — ED Triage Notes (Addendum)
Pt states she had a hysterectomy 11/30 and since has been having nausea with pain , states she has not had a BM in a week with OTC stool softners. Pt states she is currently on abx for UTI.Marland Kitchen

## 2017-02-19 NOTE — H&P (Signed)
Consult History and Physical   SERVICE: Gynecology   Patient Name: Madison Christensen Patient MRN:   [id number]  CC: Pain  HPI: Madison Christensen is a 66 y.o. F who is 14 days postop from [place], BSO for pelvic pain and postmenopausal bleeding, with an adenxal mass in the setting of prior breast cancer. She has a long hx of constipation, with a hx of hemorrhoids. 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  She was seen as an outpatient on Tuesday and dx with UTI and placed on po abx. This morning she noted continued abdominal pain, constipation, nausea and generalized fatigue and weakness. She felt "unsteady on" her feet and required a wheelchair on arrival. Her main sx on my exam was constipation, pelvic pressure and nausea.  Her imaging in ED was unremarkable except for stool in vault. She did have an elevated WBC at 17, and her creatinine was mildly bumped. Her UA postiive for hgb, protein and leukocytes, but neg for bacteria and nitrites. They sent urine culture and blood culture, and started her on zosyn and vanco for possible pyelonephritis.   Review of Systems: positives in bold GEN:   fevers, chills, weight changes, appetite changes, fatigue, night sweats, achiness HEENT:  HA, vision changes, hearing loss, congestion, rhinorrhea, sinus pressure, dysphagia CV:   CP, palpitations PULM:  SOB, cough GI:  abd pain, N/V/D/C - minmial vaginal spotting since surgery GU:  dysuria, urgency, frequency MSK:  arthralgias, myalgias, back pain, swelling SKIN:  rashes, color changes, pallor NEURO:  numbness, weakness, tingling, seizures, dizziness, tremors PSYCH:  depression, anxiety, behavioral problems, confusion  HEME/LYMPH:  easy bruising or bleeding ENDO:  heat/cold intolerance  Past Obstetrical History: OB History    Gravida Para Term Preterm AB Living   1         1   SAB TAB Ectopic Multiple Live Births                  Obstetric  Comments   1st Menstrual Cycle:  11 1st Pregnancy:  21       Past Gynecologic History: No LMP recorded. Patient is postmenopausal.   Past Medical History: Past Medical History:  Diagnosis Date  . Breast cancer ([place]) 2016   left  . Cancer ([place])    skin   . Carcinoma of left breast ([place]) 07/30/2014   1.4 cm, T1c, N0, ER negative, PR negative, HER-2 positive  . Chronic kidney disease    h/o renal insuff  . Complication of anesthesia 2004   oversensitive to noise, lights  . COPD (chronic obstructive pulmonary disease) ([place])   . Depression 1995  . GERD (gastroesophageal reflux disease)   . Hyperlipidemia   . Hypertension     Past Surgical History:   Past Surgical History:  Procedure Laterality Date  . ABDOMINAL HYSTERECTOMY    . AXILLARY LYMPH NODE BIOPSY Left 08/03/2014   Procedure: AXILLARY LYMPH NODE BIOPSY;  Surgeon: Robert Bellow, MD;  Location: [facility] ORS;  Service: General;  Laterality: Left;  . BACK SURGERY    . BREAST BIOPSY Left   . BREAST LUMPECTOMY Left 2016  . BREAST LUMPECTOMY WITH AXILLARY LYMPH NODE DISSECTION Left 08/03/2014   Procedure: BREAST LUMPECTOMY WITH AXILLARY LYMPH NODE DISSECTION;  Surgeon: Robert Bellow, MD;  Location: [facility] OR[facility];  Service: General;  Laterality: Left;  . BREAST SURGERY Left Aug 03, 2014   Wide excision, sentinel node biopsy.  Marland Kitchen  COLONOSCOPY  2013   Dr. Vira Agar  . COLONOSCOPY WITH PROPOFOL N/A 05/30/2015   Procedure: COLONOSCOPY WITH PROPOFOL;  Surgeon: Manya Silvas, MD;  Location: [facility] ENDOSCOPY;  Service: Endoscopy;  Laterality: N/A;  . DILATION AND CURETTAGE OF UTERUS N/A 01/16/2017   Procedure: DILATATION AND CURETTAGE;  Surgeon: Ward, Honor Loh, MD;  Location: [facility] ORS;  Service: Gynecology;  Laterality: N/A;  . LAPAROSCOPIC BILATERAL SALPINGO OOPHERECTOMY Bilateral 02/06/2017   Procedure: LAPAROSCOPIC BILATERAL SALPINGO OOPHORECTOMY;  Surgeon: Ward, Honor Loh, MD;  Location: [facility] ORS;  Service: Gynecology;  Laterality:  Bilateral;  . LAPAROSCOPIC HYSTERECTOMY N/A 02/06/2017   Procedure: HYSTERECTOMY TOTAL LAPAROSCOPIC;  Surgeon: Ward, Honor Loh, MD;  Location: [facility] ORS;  Service: Gynecology;  Laterality: N/A;  . PORTACATH PLACEMENT Right 08/31/2014   Procedure: INSERTION PORT-A-CATH;  Surgeon: Robert Bellow, MD;  Location: [facility] ORS;  Service: General;  Laterality: Right;  . REDUCTION MAMMAPLASTY Bilateral 2016  . SENTINEL NODE BIOPSY Left 08/03/2014   Procedure: SENTINEL NODE BIOPSY;  Surgeon: Robert Bellow, MD;  Location: [facility] ORS;  Service: General;  Laterality: Left;  . skin cancer  removal  2015   nose   . TONSILLECTOMY      Family History:  family history includes Breast cancer (age of onset: 75) in her cousin; Breast cancer (age of onset: 58) in her mother.  Social History:  Social History   Socioeconomic History  . Marital status: Widowed    Spouse name: Not on file  . Number of children: Not on file  . Years of education: Not on file  . Highest education level: Not on file  Social Needs  . Financial resource strain: Not on file  . Food insecurity - worry: Not on file  . Food insecurity - inability: Not on file  . Transportation needs - medical: Not on file  . Transportation needs - non-medical: Not on file  Occupational History  . Not on file  Tobacco Use  . Smoking status: Former Smoker    Packs/day: 1.00    Years: 2.00    Pack years: 2.00    Types: Cigarettes    Last attempt to quit: 07/30/1976    Years since quitting: 40.5  . Smokeless tobacco: Never Used  Substance and Sexual Activity  . Alcohol use: Yes    Alcohol/week: 0.0 - 1.2 oz  . Drug use: No  . Sexual activity: Not on file  Other Topics Concern  . Not on file  Social History Narrative  . Not on file    Home Medications:  Medications reconciled in EPIC  Current Facility-Administered Medications on File Prior to Encounter  Medication Dose Route Frequency Provider Last Rate Last Dose  . 0.9 %  sodium  chloride infusion   Intravenous Continuous Manya Silvas, MD      . diphenhydrAMINE (BENADRYL) capsule 25 mg  25 mg Oral Once [place], Delorise Shiner, MD      . sodium chloride 0.9 % injection 10 mL  10 mL Intravenous PRN Forest Gleason, MD   10 mL at 09/25/14 1418  . sodium chloride 0.9 % injection 10 mL  10 mL Intravenous PRN Forest Gleason, MD   10 mL at 01/15/15 1325  . sodium chloride flush (NS) 0.9 % injection 10 mL  10 mL Intravenous PRN Lequita Asal, MD   10 mL at 07/22/16 1011   Current Outpatient Medications on File Prior to Encounter  Medication Sig Dispense Refill  . acetaminophen-codeine (TYLENOL #3) 300-30 MG  tablet Take 1-2 tablets by mouth every 4 (four) hours as needed for moderate pain. 21 tablet 0  . albuterol (PROVENTIL HFA;VENTOLIN HFA) 108 (90 Base) MCG/ACT inhaler Inhale 2 puffs into the lungs every 6 (six) hours as needed for wheezing or shortness of breath. 1 Inhaler 2  . aspirin EC 81 MG tablet Take 81 mg daily by mouth.     . cholecalciferol (VITAMIN D) 1000 units tablet Take 1,000 Units by mouth daily.    . citalopram (CELEXA) 20 MG tablet TAKE 1 TABLET (20 MG TOTAL) BY MOUTH DAILY. (Patient taking differently: Take 30 mg daily by mouth. ) 30 tablet 5  . gabapentin (NEURONTIN) 100 MG capsule TAKE 1 CAPSULE BY MOUTH THREE TIMES DAILY (Patient taking differently: Take 200 mg by mouth in the morning and take 100 mg by mouth at bedtime) 90 capsule 1  . Ipratropium-Albuterol (COMBIVENT) 20-100 MCG/ACT AERS respimat Inhale 2 puffs 2 (two) times daily into the lungs.     . Multiple Vitamin (MULTIVITAMIN) tablet Take 2 tablets daily by mouth.     . nitrofurantoin, macrocrystal-monohydrate, (MACROBID) 100 MG capsule Take 1 capsule by mouth 2 (two) times daily.    Vladimir Faster Glycol-Propyl Glycol (SYSTANE ULTRA) 0.4-0.3 % SOLN Place 1 drop as needed into both eyes (for dry eyes).    . pravastatin (PRAVACHOL) 40 MG tablet Take 40 mg at bedtime by mouth.   3  . spironolactone  (ALDACTONE) 25 MG tablet Take 25 mg by mouth 2 (two) times daily.     Marland Kitchen acetaminophen (TYLENOL) 500 MG tablet Take 1,000 mg every 6 (six) hours as needed by mouth for moderate pain or headache.     . gabapentin (NEURONTIN) 300 MG capsule Take 3 capsules (900 mg total) by mouth at bedtime for 5 days. 15 capsule 0  . NON FORMULARY Apply 1 application 2 (two) times daily topically. Unscented Free-up Professional Massage Cream Mixed with Lavendar      Allergies:  Allergies  Allergen Reactions  . Losartan Other (See Comments)    Sick on stomach  . Lovastatin Other (See Comments)    Per pt headache  . Sulfa Antibiotics Hives and Rash    Physical Exam:  Temp:  [98.8 F (37.1 C)-100.7 F (38.2 C)] 99.2 F (37.3 C) (12/13 1129) Pulse Rate:  [88-108] 93 (12/13 1129) Resp:  [15-20] 20 (12/13 1129) BP: (107-136)/(37-70) 132/57 (12/13 1129) SpO2:  [92 %-95 %] 92 % (12/13 1129) Weight:  [105.4 kg (232 lb 4.8 oz)] 105.4 kg (232 lb 4.8 oz) (12/13 1129)   General Appearance:  Well developed, well nourished, no acute distress, alert and oriented x3 HEENT:  Normocephalic atraumatic, extraocular movements intact, moist mucous membranes Cardiovascular:  Normal S1/S2, regular rate and rhythm, no murmurs Pulmonary:  clear to auscultation, no wheezes, rales or rhonchi, symmetric air entry, good air exchange Abdomen:  Bowel sounds present, soft, nontender, nondistended, no abnormal masses, no epigastric pain. Healing ecchymoses from Lovenox shots. Incisions are c/d/i. No peritoneal signs. No CVA tenderness, but maybe feels a "twinge" on the left. Extremities:  Full range of motion, no pedal edema, 2+ distal pulses, no tenderness Skin:  normal coloration and turgor, no rashes, no suspicious skin lesions noted  Neurologic:  Cranial nerves 2-12 grossly intact, normal muscle tone, strength 5/5 all four extremities Psychiatric:  Normal mood and affect, appropriate, no AH/VH Pelvic:  NEFG, no vulvar masses or  lesions, normal vaginal mucosa, minimal spotting on glove. Appropriate cuff tenderness. No odor.  Labs/Studies:   CBC and Coags:  Lab Results  Component Value Date   WBC 17.2 (H) 02/19/2017   NEUTOPHILPCT 55 11/24/2016   EOSPCT 2 11/24/2016   BASOPCT 1 11/24/2016   LYMPHOPCT 35 11/24/2016   HGB 15.0 02/19/2017   HCT 45.0 02/19/2017   MCV 90.0 02/19/2017   PLT 189 02/19/2017   CMP:  Lab Results  Component Value Date   NA 136 02/19/2017   K 4.0 02/19/2017   CL 103 02/19/2017   CO2 24 02/19/2017   BUN 15 02/19/2017   CREATININE 1.04 (H) 02/19/2017   CREATININE 1.00 01/16/2017   CREATININE 1.09 (H) 11/24/2016   PROT 7.9 02/19/2017   BILITOT 2.2 (H) 02/19/2017   BILIDIR 0.1 11/24/2016   ALT 16 02/19/2017   AST 22 02/19/2017   ALKPHOS 54 02/19/2017     Other Imaging: Ct Abdomen Pelvis W Contrast  Result Date: 02/19/2017 CLINICAL DATA:  Nausea and pelvic pain since a hysterectomy 02/06/2017. EXAM: CT ABDOMEN AND PELVIS WITH CONTRAST TECHNIQUE: Multidetector CT imaging of the abdomen and pelvis was performed using the standard protocol following bolus administration of intravenous contrast. CONTRAST:  166m ISOVUE-300 IOPAMIDOL (ISOVUE-300) INJECTION 61% COMPARISON:  CT scan 02/01/2005 FINDINGS: Lower chest: The lung bases are clear of acute process. No pleural effusion or pulmonary lesions. The heart is normal in size. No pericardial effusion. The distal esophagus and aorta are unremarkable. Hepatobiliary: No focal hepatic lesions or intrahepatic biliary dilatation. The gallbladder is normal. No common bile duct dilatation. Pancreas: No mass, inflammation or ductal dilatation. Spleen: Normal size. No focal lesions. Moderate splenic artery calcifications near the splenic hilum. Adrenals/Urinary Tract: The adrenal glands and kidneys are unremarkable. Fetal lobulations versus remote cortical scarring. Stomach/Bowel: The stomach, duodenum, small bowel and colon are grossly normal  without oral contrast. No inflammatory changes, mass lesions or obstructive findings. The terminal ileum and appendix are normal. Moderate stool in the rectum is noted. Vascular/Lymphatic: Moderate atherosclerotic calcifications involving the aorta and iliac arteries but no aneurysm or dissection. The major venous structures are patent. Small scattered mesenteric and retroperitoneal lymph nodes but no mass or adenopathy. Reproductive: Status post hysterectomy. Minimal fluid in the cul de sac is probably postoperative liquified hematoma. No findings suspicious for an abscess. Other: No inguinal mass or adenopathy. No abdominal wall hernia or subcutaneous lesions. Musculoskeletal: No significant bony findings. There are advanced facet degenerative changes noted in the lumbar spine. IMPRESSION: 1. Postoperative changes in the pelvis from the recent hysterectomy but no findings suspicious for abscess or hematoma. 2. Moderate stool noted in the rectum. 3. No other significant abdominal/pelvic findings. Electronically Signed   By: PMarijo SanesM.D.   On: 02/19/2017 09:39     Assessment / Plan:   GTJUANA VICKREYis a 66y.o. who presents with postop infection, meeting criteria for SIRS in the ED, feeling much improved now but still shaky and fatigued. She did move her bowels a little over the cours eof the day. DDx does include pyelonephritis, though is not classic. Also in Ddx is constipation with or without mild ileus, sepsis, cuff cellulitis, UTI  1. Continue zosyn and vanco as she feels clinically improved and no further fevers since admission. Will follow for blood and urine cultures and tailor treatment  2. Will order upright flat plate for bowels.  3. Enema tonight 4. Pain meds prn, being careful with narcotics for their constipating qualities. 5. Regular diet 6. Ambulate as tolerated. Unless bed bound, will not restart  anticoagulation or order scds. 7. Recommend incentive spirometry. 8. Follow  for clinica improvement.

## 2017-02-19 NOTE — ED Notes (Signed)
First nurse notes  Presents with abd pain and nausea   States she recently had a hyst

## 2017-02-19 NOTE — ED Provider Notes (Signed)
[facility] Emergency Department Provider Note  ____________________________________________  Time seen: Approximately 9:55 AM  I have reviewed the triage vital signs and the nursing notes.   HISTORY  Chief Complaint Abdominal Pain   HPI Madison Christensen is a 66 y.o. female POD 13 from total hysterectomy and bilateral salpingo-oophorectomy who presents for evaluation of abdominal pain, dysuria, and fever. Patient reports that she has had dysuria since the surgery. She was seen back at the surgeon's office 2 days ago and started on Macrobid. She continues to have a moderate cramping lower abdominal pain and continues to have dysuria and frequency. Patient reports severe nausea for the last week. She denies flank pain or vomiting. She reports that she has been very constipated for the last week however had a bowel movement yesterday. She is passing flatus. She denies abdominal distention or prior history of SBO. Patient has had chills and fever upon arrival to the emergency room.  Past Medical History:  Diagnosis Date  . Breast cancer ([place]) 2016   left  . Cancer ([place])    skin   . Carcinoma of left breast ([place]) 07/30/2014   1.4 cm, T1c, N0, ER negative, PR negative, HER-2 positive  . Chronic kidney disease    h/o renal insuff  . Complication of anesthesia 2004   oversensitive to noise, lights  . COPD (chronic obstructive pulmonary disease) ([place])   . Depression 1995  . GERD (gastroesophageal reflux disease)   . Hyperlipidemia   . Hypertension     Patient Active Problem List   Diagnosis Date Noted  . Pyelonephritis 02/19/2017  . Elevated bilirubin 07/22/2016  . SOB (shortness of breath) 04/29/2016  . Mild chronic obstructive pulmonary disease ([place]) 01/09/2016  . Chronic cough 11/16/2015  . Chronic diarrhea 11/16/2015  . Essential hypertension 11/16/2015  . Hyperlipidemia, unspecified 11/16/2015  . MDD (major depressive disorder), recurrent  episode, moderate ([place]) 11/16/2015  . Stage 3 chronic kidney disease ([place]) 11/16/2015  . Cancer ([place]) 10/02/2014  . Malignant neoplasm of left female breast ([place]) 08/09/2014  . Carcinoma of left breast ([place]) 07/30/2014  . Depression 07/22/2014  . Low potassium syndrome 07/22/2014  . Left breast mass 07/12/2014    Past Surgical History:  Procedure Laterality Date  . ABDOMINAL HYSTERECTOMY    . AXILLARY LYMPH NODE BIOPSY Left 08/03/2014   Procedure: AXILLARY LYMPH NODE BIOPSY;  Surgeon: Robert Bellow, MD;  Location: [facility] ORS;  Service: General;  Laterality: Left;  . BACK SURGERY    . BREAST BIOPSY Left   . BREAST LUMPECTOMY Left 2016  . BREAST LUMPECTOMY WITH AXILLARY LYMPH NODE DISSECTION Left 08/03/2014   Procedure: BREAST LUMPECTOMY WITH AXILLARY LYMPH NODE DISSECTION;  Surgeon: Robert Bellow, MD;  Location: [facility] ORS;  Service: General;  Laterality: Left;  . BREAST SURGERY Left Aug 03, 2014   Wide excision, sentinel node biopsy.  . COLONOSCOPY  2013   Dr. Vira Agar  . COLONOSCOPY WITH PROPOFOL N/A 05/30/2015   Procedure: COLONOSCOPY WITH PROPOFOL;  Surgeon: Manya Silvas, MD;  Location[facility];  Service: Endoscopy;  Laterality: N/A;  . DILATION AND CURETTAGE OF UTERUS N/A 01/16/2017   Procedure: DILATATION AND CURETTAGE;  Surgeon: Ward, Honor Loh, MD;  Location: [facility] ORS;  Service: Gynecology;  Laterality: N/A;  . LAPAROSCOPIC BILATERAL SALPINGO OOPHERECTOMY Bilateral 02/06/2017   Procedure: LAPAROSCOPIC BILATERAL SALPINGO OOPHORECTOMY;  Surgeon: Ward, Honor Loh, MD;  Location: [facility] ORS;  Service: Gynecology;  Laterality: Bilateral;  . LAPAROSCOPIC HYSTERECTOMY N/A  02/06/2017   Procedure: HYSTERECTOMY TOTAL LAPAROSCOPIC;  Surgeon: Ward, Honor Loh, MD;  Location: [facility] ORS;  Service: Gynecology;  Laterality: N/A;  . PORTACATH PLACEMENT Right 08/31/2014   Procedure: INSERTION PORT-A-CATH;  Surgeon: Robert Bellow, MD;  Location: [facility] ORS;  Service: General;  Laterality:  Right;  . REDUCTION MAMMAPLASTY Bilateral 2016  . SENTINEL NODE BIOPSY Left 08/03/2014   Procedure: SENTINEL NODE BIOPSY;  Surgeon: Robert Bellow, MD;  Location: [facility] ORS;  Service: General;  Laterality: Left;  . skin cancer  removal  2015   nose   . TONSILLECTOMY      Prior to Admission medications   Medication Sig Start Date End Date Taking? Authorizing Provider  acetaminophen-codeine (TYLENOL #3) 300-30 MG tablet Take 1-2 tablets by mouth every 4 (four) hours as needed for moderate pain. 02/06/17  Yes Ward, Honor Loh, MD  albuterol (PROVENTIL HFA;VENTOLIN HFA) 108 (90 Base) MCG/ACT inhaler Inhale 2 puffs into the lungs every 6 (six) hours as needed for wheezing or shortness of breath. 05/01/16  Yes Epifanio Lesches, MD  aspirin EC 81 MG tablet Take 81 mg daily by mouth.    Yes [provider]  cholecalciferol (VITAMIN D) 1000 units tablet Take 1,000 Units by mouth daily.   Yes [provider]  citalopram (CELEXA) 20 MG tablet TAKE 1 TABLET (20 MG TOTAL) BY MOUTH DAILY. Patient taking differently: Take 30 mg daily by mouth.  11/27/15  Yes Lloyd Huger, MD  gabapentin (NEURONTIN) 100 MG capsule TAKE 1 CAPSULE BY MOUTH THREE TIMES DAILY Patient taking differently: Take 200 mg by mouth in the morning and take 100 mg by mouth at bedtime 07/15/16  Yes Corcoran, Melissa C, MD  Ipratropium-Albuterol (COMBIVENT) 20-100 MCG/ACT AERS respimat Inhale 2 puffs 2 (two) times daily into the lungs.  06/12/16 06/12/17 Yes [provider]  Multiple Vitamin (MULTIVITAMIN) tablet Take 2 tablets daily by mouth.    Yes [provider]  nitrofurantoin, macrocrystal-monohydrate, (MACROBID) 100 MG capsule Take 1 capsule by mouth 2 (two) times daily. 02/17/17  Yes [provider]  Polyethyl Glycol-Propyl Glycol (SYSTANE ULTRA) 0.4-0.3 % SOLN Place 1 drop as needed into both eyes (for dry eyes).   Yes [provider]  pravastatin (PRAVACHOL) 40 MG tablet Take  40 mg at bedtime by mouth.  06/14/14  Yes [provider]  spironolactone (ALDACTONE) 25 MG tablet Take 25 mg by mouth 2 (two) times daily.  01/09/16 02/19/17 Yes [provider]  acetaminophen (TYLENOL) 500 MG tablet Take 1,000 mg every 6 (six) hours as needed by mouth for moderate pain or headache.     [provider]  gabapentin (NEURONTIN) 300 MG capsule Take 3 capsules (900 mg total) by mouth at bedtime for 5 days. 02/06/17 02/11/17  Ward, Honor Loh, MD  NON FORMULARY Apply 1 application 2 (two) times daily topically. Unscented Free-up Professional Massage Cream Mixed with Engineer, drilling, Historical, MD    Allergies Losartan; Lovastatin; and Sulfa antibiotics  Family History  Problem Relation Age of Onset  . Breast cancer Mother 74          . Breast cancer Cousin 62            Social History Social History   Tobacco Use  . Smoking status: Former Smoker    Packs/day: 1.00    Years: 2.00    Pack years: 2.00    Types: Cigarettes    Last attempt to quit: 07/30/1976  Years since quitting: 40.5  . Smokeless tobacco: Never Used  Substance Use Topics  . Alcohol use: Yes    Alcohol/week: 0.0 - 1.2 oz  . Drug use: No    Review of Systems  Constitutional: + fever and chills Eyes: Negative for visual changes. ENT: Negative for sore throat. Neck: No neck pain  Cardiovascular: Negative for chest pain. Respiratory: Negative for shortness of breath. Gastrointestinal: + Lower abdominal pain and nausea. No vomiting or diarrhea. Genitourinary: + dysuria and frequency Musculoskeletal: Negative for back pain. Skin: Negative for rash. Neurological: Negative for headaches, weakness or numbness. Psych: No SI or HI  ____________________________________________   PHYSICAL EXAM:  VITAL SIGNS: ED Triage Vitals  Enc Vitals Group     BP 02/19/17 0729 136/70     Pulse Rate 02/19/17 0729 (!) 108     Resp 02/19/17 0729 18     Temp 02/19/17 0729 (!)  100.7 F (38.2 C)     Temp Source 02/19/17 0729 Oral     SpO2 02/19/17 0729 93 %     Weight --      Height --      Head Circumference --      Peak Flow --      Pain Score 02/19/17 0733 5     Pain Loc --      Pain Edu? --      Excl. in [place]? --     Constitutional: Alert and oriented. Well appearing and in no apparent distress. HEENT:      Head: Normocephalic and atraumatic.         Eyes: Conjunctivae are normal. Sclera is non-icteric.       Mouth/Throat: Mucous membranes are moist.       Neck: Supple with no signs of meningismus. Cardiovascular: Regular rate and rhythm. No murmurs, gallops, or rubs. 2+ symmetrical distal pulses are present in all extremities. No JVD. Respiratory: Normal respiratory effort. Lungs are clear to auscultation bilaterally. No wheezes, crackles, or rhonchi.  Gastrointestinal: Soft, non tender, and non distended with positive bowel sounds. No rebound or guarding. Genitourinary: No CVA tenderness. Musculoskeletal: Nontender with normal range of motion in all extremities. No edema, cyanosis, or erythema of extremities. Neurologic: Normal speech and language. Face is symmetric. Moving all extremities. No gross focal neurologic deficits are appreciated. Skin: Skin is warm, dry and intact. No rash noted. Psychiatric: Mood and affect are normal. Speech and behavior are normal.  ____________________________________________   LABS (all labs ordered are listed, but only abnormal results are displayed)  Labs Reviewed  COMPREHENSIVE METABOLIC PANEL - Abnormal; Notable for the following components:      Result Value   Glucose, Bld 135 (*)    Creatinine, Ser 1.04 (*)    Total Bilirubin 2.2 (*)    GFR calc non Af Amer 55 (*)    All other components within normal limits  CBC - Abnormal; Notable for the following components:   WBC 17.2 (*)    All other components within normal limits  URINALYSIS, COMPLETE (UACMP) WITH MICROSCOPIC - Abnormal; Notable for the  following components:   Color, Urine YELLOW (*)    APPearance HAZY (*)    Hgb urine dipstick MODERATE (*)    Protein, ur 30 (*)    Leukocytes, UA SMALL (*)    Squamous Epithelial / LPF 0-5 (*)    Non Squamous Epithelial 0-5 (*)    All other components within normal limits  URINE CULTURE  CULTURE, BLOOD (ROUTINE X  2)  CULTURE, BLOOD (ROUTINE X 2)  LIPASE, BLOOD  LACTIC ACID, PLASMA   ____________________________________________  EKG  none  ____________________________________________  RADIOLOGY  CT a/p:  1. Postoperative changes in the pelvis from the recent hysterectomy but no findings suspicious for abscess or hematoma. 2. Moderate stool noted in the rectum. 3. No other significant abdominal/pelvic findings. ____________________________________________   PROCEDURES  Procedure(s) performed: None Procedures Critical Care performed: yes  CRITICAL CARE Performed by: Rudene Re  ?  Total critical care time: 40 min  Critical care time was exclusive of separately billable procedures and treating other patients.  Critical care was necessary to treat or prevent imminent or life-threatening deterioration.  Critical care was time spent personally by me on the following activities: development of treatment plan with patient and/or surrogate as well as nursing, discussions with consultants, evaluation of patient's response to treatment, examination of patient, obtaining history from patient or surrogate, ordering and performing treatments and interventions, ordering and review of laboratory studies, ordering and review of radiographic studies, pulse oximetry and re-evaluation of patient's condition.  ____________________________________________   INITIAL IMPRESSION / ASSESSMENT AND PLAN / ED COURSE   66 y.o. female POD 13 from total hysterectomy and bilateral salpingo-oophorectomy who presents for evaluation of abdominal pain, dysuria, and fever. Patient with fever,  tachycardia, leukocytosis with white count of 17.2. CT scan with no evidence of postop intra-abdominal complications. UA concerning for UTI. Patient meets sepsis criteria with concerns for complicated UTI in the setting of foley catheter during recent surgery so she was given zosyn and vancomycin and was admitted to [facility] for further management.      As part of my medical decision making, I reviewed the following data within the [facility] notes reviewed and incorporated, Labs reviewed , Radiograph reviewed , Discussed with admitting physician , Notes from prior ED visits and [place] Controlled Substance Database    Pertinent labs & imaging results that were available during my care of the patient were reviewed by me and considered in my medical decision making (see chart for details).    ____________________________________________   FINAL CLINICAL IMPRESSION(S) / ED DIAGNOSES  Final diagnoses:  Abdominal pain  Sepsis, due to unspecified organism ([place])  Complicated UTI (urinary tract infection)      NEW MEDICATIONS STARTED DURING THIS VISIT:  ED Discharge Orders    None       Note:  This document was prepared using Dragon voice recognition software and may include unintentional dictation errors.    Rudene Re, MD 02/19/17 [phone]

## 2017-02-19 NOTE — ED Notes (Signed)
Attempting to call report at this time. 

## 2017-02-20 DIAGNOSIS — R109 Unspecified abdominal pain: Secondary | ICD-10-CM | POA: Diagnosis not present

## 2017-02-20 LAB — COMPREHENSIVE METABOLIC PANEL
ALBUMIN: 3.6 g/dL (ref 3.5–5.0)
ALK PHOS: 46 U/L (ref 38–126)
ALT: 13 U/L — AB (ref 14–54)
ANION GAP: 7 (ref 5–15)
AST: 16 U/L (ref 15–41)
BUN: 15 mg/dL (ref 6–20)
CALCIUM: 8.6 mg/dL — AB (ref 8.9–10.3)
CO2: 26 mmol/L (ref 22–32)
Chloride: 105 mmol/L (ref 101–111)
Creatinine, Ser: 1.11 mg/dL — ABNORMAL HIGH (ref 0.44–1.00)
GFR calc Af Amer: 59 mL/min — ABNORMAL LOW (ref >60)
GFR calc non Af Amer: 51 mL/min — ABNORMAL LOW (ref >60)
GLUCOSE: 108 mg/dL — AB (ref 65–99)
Potassium: 3.6 mmol/L (ref 3.5–5.1)
SODIUM: 138 mmol/L (ref 135–145)
Total Bilirubin: 2.5 mg/dL — ABNORMAL HIGH (ref 0.3–1.2)
Total Protein: 6.9 g/dL (ref 6.5–8.1)

## 2017-02-20 LAB — URINE CULTURE

## 2017-02-20 LAB — CBC WITH DIFFERENTIAL/PLATELET
BASOS ABS: 0.1 10*3/uL (ref 0–0.1)
BASOS PCT: 1 %
EOS ABS: 0.4 10*3/uL (ref 0–0.7)
Eosinophils Relative: 4 %
HCT: 40.5 % (ref 35.0–47.0)
HEMOGLOBIN: 13.6 g/dL (ref 12.0–16.0)
Lymphocytes Relative: 15 %
Lymphs Abs: 1.5 10*3/uL (ref 1.0–3.6)
MCH: 30.6 pg (ref 26.0–34.0)
MCHC: 33.7 g/dL (ref 32.0–36.0)
MCV: 90.9 fL (ref 80.0–100.0)
Monocytes Absolute: 0.8 10*3/uL (ref 0.2–0.9)
Monocytes Relative: 8 %
NEUTROS PCT: 72 %
Neutro Abs: 7.2 10*3/uL — ABNORMAL HIGH (ref 1.4–6.5)
Platelets: 160 10*3/uL (ref 150–440)
RBC: 4.45 MIL/uL (ref 3.80–5.20)
RDW: 13.3 % (ref 11.5–14.5)
WBC: 9.9 10*3/uL (ref 3.6–11.0)

## 2017-02-20 MED ORDER — SPIRONOLACTONE 25 MG PO TABS
25.0000 mg | ORAL_TABLET | Freq: Two times a day (BID) | ORAL | Status: DC
  Administered 2017-02-20 – 2017-02-21 (×3): 25 mg via ORAL
  Filled 2017-02-20 (×3): qty 1

## 2017-02-20 MED ORDER — CITALOPRAM HYDROBROMIDE 20 MG PO TABS
20.0000 mg | ORAL_TABLET | Freq: Every day | ORAL | Status: DC
  Administered 2017-02-20 – 2017-02-21 (×2): 20 mg via ORAL
  Filled 2017-02-20 (×2): qty 1

## 2017-02-20 MED ORDER — IPRATROPIUM-ALBUTEROL 0.5-2.5 (3) MG/3ML IN SOLN
3.0000 mL | Freq: Two times a day (BID) | RESPIRATORY_TRACT | Status: DC
  Administered 2017-02-20 – 2017-02-21 (×2): 3 mL via RESPIRATORY_TRACT
  Filled 2017-02-20 (×2): qty 3

## 2017-02-20 MED ORDER — ALBUTEROL SULFATE (2.5 MG/3ML) 0.083% IN NEBU: 2.5000 mg | INHALATION_SOLUTION | Freq: Four times a day (QID) | RESPIRATORY_TRACT | Status: DC | PRN

## 2017-02-20 MED ORDER — ASPIRIN EC 81 MG PO TBEC
81.0000 mg | DELAYED_RELEASE_TABLET | Freq: Every day | ORAL | Status: DC
  Administered 2017-02-20 – 2017-02-21 (×2): 81 mg via ORAL
  Filled 2017-02-20 (×2): qty 1

## 2017-02-20 MED ORDER — POLYVINYL ALCOHOL 1.4 % OP SOLN
1.0000 [drp] | OPHTHALMIC | Status: DC | PRN
  Filled 2017-02-20: qty 15

## 2017-02-20 MED ORDER — AMOXICILLIN-POT CLAVULANATE 875-125 MG PO TABS
1.0000 | ORAL_TABLET | Freq: Two times a day (BID) | ORAL | Status: DC
  Administered 2017-02-20 – 2017-02-21 (×3): 1 via ORAL
  Filled 2017-02-20 (×3): qty 1

## 2017-02-20 MED ORDER — VITAMIN D 1000 UNITS PO TABS
1000.0000 [IU] | ORAL_TABLET | Freq: Every day | ORAL | Status: DC
  Administered 2017-02-20 – 2017-02-21 (×2): 1000 [IU] via ORAL
  Filled 2017-02-20 (×2): qty 1

## 2017-02-20 MED ORDER — ACETAMINOPHEN 500 MG PO TABS
1000.0000 mg | ORAL_TABLET | Freq: Four times a day (QID) | ORAL | Status: DC | PRN
  Administered 2017-02-21: 1000 mg via ORAL
  Filled 2017-02-20: qty 2

## 2017-02-20 MED ORDER — ADULT MULTIVITAMIN W/MINERALS CH
2.0000 | ORAL_TABLET | Freq: Every day | ORAL | Status: DC
  Filled 2017-02-20: qty 2

## 2017-02-20 MED ORDER — GABAPENTIN 100 MG PO CAPS
200.0000 mg | ORAL_CAPSULE | Freq: Every morning | ORAL | Status: DC
  Administered 2017-02-21: 200 mg via ORAL
  Filled 2017-02-20: qty 2

## 2017-02-20 MED ORDER — GABAPENTIN 100 MG PO CAPS
100.0000 mg | ORAL_CAPSULE | Freq: Every day | ORAL | Status: DC
  Administered 2017-02-20: 22:00:00 100 mg via ORAL
  Filled 2017-02-20: qty 1

## 2017-02-20 MED ORDER — PRAVASTATIN SODIUM 20 MG PO TABS
40.0000 mg | ORAL_TABLET | Freq: Every day | ORAL | Status: DC
  Administered 2017-02-20: 22:00:00 40 mg via ORAL
  Filled 2017-02-20 (×2): qty 2

## 2017-02-20 NOTE — Plan of Care (Signed)
  Progressing Education: Knowledge of General Education information will improve 02/20/2017 1429 - Progressing by Herbie [place], RN Health Behavior/Discharge Planning: Ability to manage health-related needs will improve 02/20/2017 1429 - Progressing by Herbie [place], RN Clinical Measurements: Ability to maintain clinical measurements within normal limits will improve 02/20/2017 1429 - Progressing by Herbie [place], RN Will remain free from infection 02/20/2017 1429 - Progressing by Herbie [place], RN Diagnostic test results will improve 02/20/2017 1429 - Progressing by Herbie [place], RN Respiratory complications will improve 02/20/2017 1429 - Progressing by Herbie [place], RN Cardiovascular complication will be avoided 02/20/2017 1429 - Progressing by Herbie [place], RN Activity: Risk for activity intolerance will decrease 02/20/2017 1429 - Progressing by Herbie [place], RN Nutrition: Adequate nutrition will be maintained 02/20/2017 1429 - Progressing by Herbie [place], RN Coping: Level of anxiety will decrease 02/20/2017 1429 - Progressing by Herbie [place], RN Elimination: Will not experience complications related to bowel motility 02/20/2017 1429 - Progressing by Herbie [place], RN Will not experience complications related to urinary retention 02/20/2017 1429 - Progressing by Herbie Baltimore, RN Pain Managment: General experience of comfort will improve 02/20/2017 1429 - Progressing by Herbie Baltimore, RN Safety: Ability to remain free from injury will improve 02/20/2017 1429 - Progressing by Herbie [place], RN Skin Integrity: Risk for impaired skin integrity will decrease 02/20/2017 1429 - Progressing by Herbie [place], RN

## 2017-02-20 NOTE — Progress Notes (Signed)
Obstetric and Gynecology  HD # 2  Subjective  Patient doing well, no complaints, tolerating PO intake, tolerating pain with PO meds, ambulating without difficulty, voiding spontaneously.    Had 3 BMs today and pain/pressure is improving. Nausea persists. Has not had her home meds today or yesterday.  Denies CP, SOB, F/C, V/D, or leg pain.   Objective  Objective:   Vitals:   02/19/17 2056 02/20/17 0457 02/20/17 1125 02/20/17 1236  BP: (!) 142/52 (!) 125/58 (!) 112/50 125/74  Pulse: 88 73 73 82  Resp: 13 18 18 20   Temp: 98.5 F (36.9 C) 98.1 F (36.7 C) 97.8 F (36.6 C) 98.7 F (37.1 C)  TempSrc: Oral Oral Oral Oral  SpO2: 94% 95% 96% 98%  Weight:      Height:        General: NAD Cardiovascular: RRR, no murmurs Pulmonary: CTAB, normal respiratory effort Abdomen: Benign. Non-tender, +BS, no guarding. Extremities: No erythema or cords, no calf tenderness, with normal peripheral pulses.  Labs: Results for orders placed or performed during the hospital encounter of 02/19/17 (from the past 24 hour(s))  CBC WITH DIFFERENTIAL     Status: Abnormal   Collection Time: 02/20/17  5:09 AM  Result Value Ref Range   WBC 9.9 3.6 - 11.0 K/uL   RBC 4.45 3.80 - 5.20 MIL/uL   Hemoglobin 13.6 12.0 - 16.0 g/dL   HCT 40.5 35.0 - 47.0 %   MCV 90.9 80.0 - 100.0 fL   MCH 30.6 26.0 - 34.0 pg   MCHC 33.7 32.0 - 36.0 g/dL   RDW 13.3 11.5 - 14.5 %   Platelets 160 150 - 440 K/uL   Neutrophils Relative % 72 %   Neutro Abs 7.2 (H) 1.4 - 6.5 K/uL   Lymphocytes Relative 15 %   Lymphs Abs 1.5 1.0 - 3.6 K/uL   Monocytes Relative 8 %   Monocytes Absolute 0.8 0.2 - 0.9 K/uL   Eosinophils Relative 4 %   Eosinophils Absolute 0.4 0 - 0.7 K/uL   Basophils Relative 1 %   Basophils Absolute 0.1 0 - 0.1 K/uL  Comprehensive metabolic panel     Status: Abnormal   Collection Time: 02/20/17  5:09 AM  Result Value Ref Range   Sodium 138 135 - 145 mmol/L   Potassium 3.6 3.5 - 5.1 mmol/L   Chloride 105  101 - 111 mmol/L   CO2 26 22 - 32 mmol/L   Glucose, Bld 108 (H) 65 - 99 mg/dL   BUN 15 6 - 20 mg/dL   Creatinine, Ser 1.11 (H) 0.44 - 1.00 mg/dL   Calcium 8.6 (L) 8.9 - 10.3 mg/dL   Total Protein 6.9 6.5 - 8.1 g/dL   Albumin 3.6 3.5 - 5.0 g/dL   AST 16 15 - 41 U/L   ALT 13 (L) 14 - 54 U/L   Alkaline Phosphatase 46 38 - 126 U/L   Total Bilirubin 2.5 (H) 0.3 - 1.2 mg/dL   GFR calc non Af Amer 51 (L) >60 mL/min   GFR calc Af Amer 59 (L) >60 mL/min   Anion gap 7 5 - 15    Cultures: Results for orders placed or performed during the hospital encounter of 02/19/17  Urine Culture     Status: Abnormal   Collection Time: 02/19/17  8:15 AM  Result Value Ref Range Status   Specimen Description URINE, RANDOM  Final   Special Requests NONE  Final   Culture MULTIPLE SPECIES PRESENT, SUGGEST RECOLLECTION (  A)  Final   Report Status 02/20/2017 FINAL  Final  Blood culture (routine x 2)     Status: None (Preliminary result)   Collection Time: 02/19/17  9:54 AM  Result Value Ref Range Status   Specimen Description BLOOD RIGHT ANTECUBITAL  Final   Special Requests   Final    BOTTLES DRAWN AEROBIC AND ANAEROBIC Blood Culture results may not be optimal due to an inadequate volume of blood received in culture bottles   Culture NO GROWTH < 24 HOURS  Final   Report Status PENDING  Incomplete  Blood culture (routine x 2)     Status: None (Preliminary result)   Collection Time: 02/19/17  9:59 AM  Result Value Ref Range Status   Specimen Description BLOOD BLOOD RIGHT HAND  Final   Special Requests   Final    BOTTLES DRAWN AEROBIC AND ANAEROBIC Blood Culture results may not be optimal due to an inadequate volume of blood received in culture bottles   Culture NO GROWTH < 24 HOURS  Final   Report Status PENDING  Incomplete    Imaging: Dg Abd 1 View  Result Date: 02/19/2017 CLINICAL DATA:  Abdominal pain and constipation EXAM: ABDOMEN - 1 VIEW COMPARISON:  CT abdomen pelvis 02/19/2017 FINDINGS:  Nonspecific bowel gas pattern. There are air-fluid levels in the right mid abdomen, nonspecific. No dilated bowel is visible. IMPRESSION: No radiographic evidence of small-bowel obstruction. Normal stool volume. Electronically Signed   By: Ulyses Jarred M.D.   On: 02/19/2017 22:33   Ct Abdomen Pelvis W Contrast  Result Date: 02/19/2017 CLINICAL DATA:  Nausea and pelvic pain since a hysterectomy 02/06/2017. EXAM: CT ABDOMEN AND PELVIS WITH CONTRAST TECHNIQUE: Multidetector CT imaging of the abdomen and pelvis was performed using the standard protocol following bolus administration of intravenous contrast. CONTRAST:  126mL ISOVUE-300 IOPAMIDOL (ISOVUE-300) INJECTION 61% COMPARISON:  CT scan 02/01/2005 FINDINGS: Lower chest: The lung bases are clear of acute process. No pleural effusion or pulmonary lesions. The heart is normal in size. No pericardial effusion. The distal esophagus and aorta are unremarkable. Hepatobiliary: No focal hepatic lesions or intrahepatic biliary dilatation. The gallbladder is normal. No common bile duct dilatation. Pancreas: No mass, inflammation or ductal dilatation. Spleen: Normal size. No focal lesions. Moderate splenic artery calcifications near the splenic hilum. Adrenals/Urinary Tract: The adrenal glands and kidneys are unremarkable. Fetal lobulations versus remote cortical scarring. Stomach/Bowel: The stomach, duodenum, small bowel and colon are grossly normal without oral contrast. No inflammatory changes, mass lesions or obstructive findings. The terminal ileum and appendix are normal. Moderate stool in the rectum is noted. Vascular/Lymphatic: Moderate atherosclerotic calcifications involving the aorta and iliac arteries but no aneurysm or dissection. The major venous structures are patent. Small scattered mesenteric and retroperitoneal lymph nodes but no mass or adenopathy. Reproductive: Status post hysterectomy. Minimal fluid in the cul de sac is probably postoperative  liquified hematoma. No findings suspicious for an abscess. Other: No inguinal mass or adenopathy. No abdominal wall hernia or subcutaneous lesions. Musculoskeletal: No significant bony findings. There are advanced facet degenerative changes noted in the lumbar spine. IMPRESSION: 1. Postoperative changes in the pelvis from the recent hysterectomy but no findings suspicious for abscess or hematoma. 2. Moderate stool noted in the rectum. 3. No other significant abdominal/pelvic findings. Electronically Signed   By: Marijo Sanes M.D.   On: 02/19/2017 09:39     Assessment   66 y.o. Hospital Day: 2 with postop infection of unknown location/source  Plan  1. Doing well, stay on bowel regimen to have daily BMs. 2. FUO: afebrile since admission on zosyn and vanco. Will finish this round of abx and change to PO meds.   3. Blood cultures pending, office urine culture negative.  Leukocytosis improved. 4. Kidney function:  Baseline 1.0-1.09 (in may 2016).  Currently 1.11 Cr.  Will recheck in the AM, but not overly concerned at this point.  CT scan negative for ureter injury or hydronephrosis.    Will consider d/c home tomorrow AM if labs, BMs, and pain are improved, and remains afebrile.  ----- Larey Days, MD Attending Obstetrician and Gynecologist [facility], Department of [facility]

## 2017-02-20 NOTE — Progress Notes (Signed)
Stopped LR fluids, patient stated she was drinking water at bedside. Patient has drank 1L of water thus far. Encouraging plenty of fluids. Pharmacy aware of LR d/c.

## 2017-02-20 NOTE — Care Management Obs Status (Signed)
[place] NOTIFICATION   Patient Details  Name: MAKAIYA GEERDES MRN: [id number] Date of Birth: Mar 22, 1950   Medicare Observation Status Notification Given:  Yes    Shelbie Ammons, RN 02/20/2017, 11:17 AM

## 2017-02-21 DIAGNOSIS — R109 Unspecified abdominal pain: Secondary | ICD-10-CM | POA: Diagnosis not present

## 2017-02-21 LAB — BASIC METABOLIC PANEL
ANION GAP: 9 (ref 5–15)
BUN: 15 mg/dL (ref 6–20)
CALCIUM: 8.8 mg/dL — AB (ref 8.9–10.3)
CO2: 23 mmol/L (ref 22–32)
CREATININE: 0.96 mg/dL (ref 0.44–1.00)
Chloride: 106 mmol/L (ref 101–111)
Glucose, Bld: 112 mg/dL — ABNORMAL HIGH (ref 65–99)
Potassium: 3.6 mmol/L (ref 3.5–5.1)
SODIUM: 138 mmol/L (ref 135–145)

## 2017-02-21 MED ORDER — DOCUSATE SODIUM 100 MG PO CAPS: 100.0000 mg | ORAL_CAPSULE | Freq: Two times a day (BID) | ORAL | 0 refills | Status: AC

## 2017-02-21 MED ORDER — AMOXICILLIN-POT CLAVULANATE 875-125 MG PO TABS: 1.0000 | ORAL_TABLET | Freq: Two times a day (BID) | ORAL | 0 refills | Status: AC

## 2017-02-21 MED ORDER — ONDANSETRON HCL 4 MG PO TABS: 4.0000 mg | ORAL_TABLET | Freq: Four times a day (QID) | ORAL | 0 refills | Status: DC | PRN

## 2017-02-21 MED ORDER — METOCLOPRAMIDE HCL 10 MG PO TABS: 10.0000 mg | ORAL_TABLET | Freq: Three times a day (TID) | ORAL | 0 refills | Status: DC

## 2017-02-21 MED ORDER — SIMETHICONE 80 MG PO CHEW
160.0000 mg | CHEWABLE_TABLET | Freq: Three times a day (TID) | ORAL | Status: DC | PRN
  Filled 2017-02-21: qty 2

## 2017-02-21 MED ORDER — METOCLOPRAMIDE HCL 5 MG PO TABS
10.0000 mg | ORAL_TABLET | Freq: Three times a day (TID) | ORAL | Status: DC
  Administered 2017-02-21 (×2): 10 mg via ORAL
  Filled 2017-02-21 (×2): qty 2

## 2017-02-21 NOTE — Discharge Summary (Signed)
Gynecological Discharge Summary  Patient Name: Madison Christensen DOB: 08-04-1950 MRN: [id number]  Date of Admission: 02/19/2017 Date of Discharge: 02/21/2017  Hospital course:   The patient was admitted 14 days postop from [place] for pelvic pain and postmenopausal bleeding, with an adenxal mass in the setting of prior breast cancer. She has a long hx of constipation, with a hx of hemorrhoids. 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  She was seen as an outpatient on Tuesday the 11th and dx with UTI and placed on PO ABX. On Thursday morning she noted continued abdominal pain, constipation, nausea and generalized fatigue and weakness. She felt "unsteady on" her feet and required a wheelchair on arrival. Her main sx on my exam was constipation, pelvic pressure, and nausea.  Her imaging in the ED was unremarkable except for stool in vault. She did have an elevated WBC at 17, and her creatinine was mildly bumped. Her UA postiive for hgb, protein and leukocytes, but neg for bacteria and nitrites. They sent urine culture and blood culture, and started her on Zosyn and Vanco for possible pyelonephritis. She was also started on a bowel regimen for constipation.   On hospital day 2 her WBC had normalized at 9.9 and she was switched from IV to PO antibiotics. She was also moving her bowels regularly on a bowel regimen.   Patient was felt to be stable for discharge on hospital day number 3. Her creatinine was in the normal range and at its baseline. She was ambulating without difficulty, her pain was controlled with PO medications, and she was moving her bowels regularly and voiding without issue. She was tolerating small meals with occasional nausea, with no episodes of vomiting. Vital signs were stable and physical exam was benign.  She will follow up per below for already scheduled post-op check as well as see GI for persistent nausea of unclear  etiology. Rx given for Augmentin, Reglan, Zofran, and Colace. She was given specific instructions and numbers to call in written and verbal format. She verbalized understanding, agrees with the plan of care, and all questions answered to her satisfaction.  Discharge Physical Exam:  BP (!) 136/59   Pulse 75   Temp 98.4 F (36.9 C) (Oral)   Resp 16   Ht 5\' 4"  (1.626 m)   Wt 105.4 kg (232 lb 4.8 oz)   SpO2 98%   BMI 39.87 kg/m   General: NAD CV: RRR Pulm: breath sounds CTA b/l, respirations unlabored ABD: soft, tender, and non-distended DVT Evaluation: b/l LE non-TTP, no evidence of DVT on exam.  Hemoglobin  Date Value Ref Range Status  02/20/2017 13.6 12.0 - 16.0 g/dL Final   HCT  Date Value Ref Range Status  02/20/2017 40.5 35.0 - 47.0 % Final    Plan:  KENNEDE LUSK was discharged to home in stable condition. Follow-up appointment at [facility] on 02/26/18 with Dr. Larey Days.  Follow-up with GI for assessment of persistent nausea.   Discharge Medications: Allergies as of 02/21/2017      Reactions   Losartan Other (See Comments)   Sick on stomach   Lovastatin Other (See Comments)   Per pt headache   Sulfa Antibiotics Hives, Rash      Medication List    TAKE these medications   acetaminophen 500 MG tablet Commonly known as:  TYLENOL Take 1,000 mg every 6 (six) hours as needed by mouth for moderate pain  or headache.   acetaminophen-codeine 300-30 MG tablet Commonly known as:  TYLENOL #3 Take 1-2 tablets by mouth every 4 (four) hours as needed for moderate pain.   albuterol 108 (90 Base) MCG/ACT inhaler Commonly known as:  PROVENTIL HFA;VENTOLIN HFA Inhale 2 puffs into the lungs every 6 (six) hours as needed for wheezing or shortness of breath.   amoxicillin-clavulanate 875-125 MG tablet Commonly known as:  AUGMENTIN Take 1 tablet by mouth every 12 (twelve) hours for 13 days.   aspirin EC 81 MG tablet Take 81 mg daily by mouth.    cholecalciferol 1000 units tablet Commonly known as:  VITAMIN D Take 1,000 Units by mouth daily.   citalopram 20 MG tablet Commonly known as:  CELEXA TAKE 1 TABLET (20 MG TOTAL) BY MOUTH DAILY. What changed:  how much to take   docusate sodium 100 MG capsule Commonly known as:  COLACE Take 1 capsule (100 mg total) by mouth 2 (two) times daily for 14 days.   gabapentin 100 MG capsule Commonly known as:  NEURONTIN TAKE 1 CAPSULE BY MOUTH THREE TIMES DAILY What changed:    how much to take  how to take this  when to take this   gabapentin 300 MG capsule Commonly known as:  NEURONTIN Take 3 capsules (900 mg total) by mouth at bedtime for 5 days. What changed:  Another medication with the same name was changed. Make sure you understand how and when to take each.   Ipratropium-Albuterol 20-100 MCG/ACT Aers respimat Commonly known as:  COMBIVENT Inhale 2 puffs 2 (two) times daily into the lungs.   metoCLOPramide 10 MG tablet Commonly known as:  REGLAN Take 1 tablet (10 mg total) by mouth every 8 (eight) hours for 13 days.   multivitamin tablet Take 2 tablets daily by mouth.   NON FORMULARY Apply 1 application 2 (two) times daily topically. Unscented Free-up Professional Massage Cream Mixed with Lavendar   ondansetron 4 MG tablet Commonly known as:  ZOFRAN Take 1 tablet (4 mg total) by mouth every 6 (six) hours as needed for nausea.   pravastatin 40 MG tablet Commonly known as:  PRAVACHOL Take 40 mg at bedtime by mouth.   spironolactone 25 MG tablet Commonly known as:  ALDACTONE Take 25 mg by mouth 2 (two) times daily.   SYSTANE ULTRA 0.4-0.3 % Soln Generic drug:  Polyethyl Glycol-Propyl Glycol Place 1 drop as needed into both eyes (for dry eyes).      Case reviewed with Dr. Benjaman Kindler who is agreement with this assessment and plan.   Signed: Lisette Grinder, CNM 6:24 PM  Lisette Grinder Certified Nurse Midwife  [facility]

## 2017-02-21 NOTE — Plan of Care (Signed)
  Progressing Education: Knowledge of General Education information will improve 02/21/2017 1740 - Progressing by Herbie Baltimore, RN Health Behavior/Discharge Planning: Ability to manage health-related needs will improve 02/21/2017 1740 - Progressing by Herbie Baltimore, RN Clinical Measurements: Ability to maintain clinical measurements within normal limits will improve 02/21/2017 1740 - Progressing by Herbie Baltimore, RN Will remain free from infection 02/21/2017 1740 - Progressing by Herbie Baltimore, RN Diagnostic test results will improve 02/21/2017 1740 - Progressing by Herbie Baltimore, RN Respiratory complications will improve 02/21/2017 1740 - Progressing by Herbie Baltimore, RN Cardiovascular complication will be avoided 02/21/2017 1740 - Progressing by Herbie Baltimore, RN Activity: Risk for activity intolerance will decrease 02/21/2017 1740 - Progressing by Herbie Baltimore, RN Nutrition: Adequate nutrition will be maintained 02/21/2017 1740 - Progressing by Herbie Baltimore, RN Coping: Level of anxiety will decrease 02/21/2017 1740 - Progressing by Herbie Baltimore, RN Elimination: Will not experience complications related to bowel motility 02/21/2017 1740 - Progressing by Herbie Baltimore, RN Will not experience complications related to urinary retention 02/21/2017 1740 - Progressing by Herbie Baltimore, RN Pain Managment: General experience of comfort will improve 02/21/2017 1740 - Progressing by Herbie Baltimore, RN Safety: Ability to remain free from injury will improve 02/21/2017 1740 - Progressing by Herbie Baltimore, RN Skin Integrity: Risk for impaired skin integrity will decrease 02/21/2017 1740 - Progressing by Herbie Baltimore, RN

## 2017-02-21 NOTE — Discharge Instructions (Signed)
Medications:  Constipation  Take Colace 100mg  twice a day for the next two weeks and after that as needed. Prescription sent to your pharmacy for this.   You can add Senna 1-2 tabs 1-2 times a day as well. You can buy this over the counter.   If you need something else, you can try the following, all of which are available over the counter:  5mg  Dulcolax a day as needed for minor constipation  Milk of Magnesia as needed for minor constipation  Magnesium citrate for severe constipation  Gas/Bloating  125-250mg  of simethicone (GasX) as needed, available over the counter  Nausea  10mg  of Reglan every 8 hours for the next two weeks. Prescription sent to your pharmacy for this. Take this medication until you finish all of the tablets.   4mg  of Zofran as needed for nausea, up to every 6 hours. Prescription sent to your pharmacy for this, along with a printed prescription that you can fill tonight in case you need it overnight.  Antibiotics  Augmentin (amoxicillin-clavulanate) 875-125 MG tablet every 12 hours for the next two weeks. Prescription sent to your pharmacy for this. Take this medication until you finish all of the tablets.   Pain medications  Take your pain medications previously prescribed as needed. Be careful to make sure that you are not taking more than 4,000mg  of acetaminophen (Tylenol) a day.   Other medications  Resume your normal home medications that you were taking before and after surgery.       Discharge Instructions after   Total laparoscopic hysterectomy  Signs and Symptoms to Report Call our office at [phone] if you have any of the following:  Fever over 100.4 degrees or higher  Severe stomach pain not relieved with pain medications  Bright red bleeding thats heavier than a period that does not slow with rest  To go the bathroom a lot (frequency), you cant hold your urine (urgency), or it hurts when you empty your bladder (urinate)   Chest pain  Shortness of breath  Pain in the calves of your legs  Severe nausea and vomiting not relieved with anti-nausea medications  Signs of infection around your wounds, such as redness, hot to touch, swelling, green/yellow drainage (like pus), bad smelling discharge  Any concerns  What You Can Expect after Surgery  You may see some pink tinged, bloody fluid and bruising around the wound. This is normal.  You may notice shoulder and neck pain. This is caused by the gas used during surgery to expand your abdomen so your surgeon could get to the uterus easier.  You may have a sore throat because of the tube in your mouth during general anesthesia. This will go away in 2 to 3 days.  You may have some stomach cramps.  You may notice spotting on your panties.  You may have pain around the incision sites.  Activities after Your Discharge Follow these guidelines to help speed your recovery at home:  Do the coughing and deep breathing as you did in the hospital for 2 weeks. Use the small blue breathing device, called the incentive spirometer for 2 weeks.  Dont drive if you are in pain or taking narcotic pain medicine. You may drive when you can safely slam on the brakes, turn the wheel forcefully, and rotate your torso comfortably. This is typically 1-2 weeks. Practice in a parking lot or side street prior to attempting to drive regularly.   Ask others to help with  household chores for 4 weeks.  Do not lift anything heavier that 10 pounds for 4-6 weeks. This includes pets, children, and groceries.  Dont do strenuous activities, exercises, or sports like vacuuming, tennis, squash, etc. until your doctor says it is safe to do so. --Maintain pelvic rest for 8 weeks. This means nothing in the vagina or rectum at all (no douching, tampons, intercourse) for 8 weeks.   Walk as you feel able. Rest often since it may take two or three weeks for your energy level to return to normal.    You may climb stairs  Avoid constipation:   -Eat fruits, vegetables, and whole grains. Eat small meals as your appetite will take time to return to normal.   -Drink 6 to 8 glasses of water each day unless your doctor has told you to limit your fluids.   -Use a laxative or stool softener as needed if constipation becomes a problem.   You may shower. Gently wash the wounds with a mild soap and water. Pat dry.  Do not get in a hot tub, swimming pool, etc. for 6 weeks.  Do not use lotions, oils, powders on the wounds.  Do not douche, use tampons, or have sex until your doctor says it is okay.  Take your pain medicine when you need it. The medicine may not work as well if the pain is bad.

## 2017-02-21 NOTE — Progress Notes (Signed)
Patient being discharged, discharged instructions and prescriptions reviewed with patient and friend, states understanding. Patient with no complaints at discharge.

## 2017-02-21 NOTE — Progress Notes (Signed)
Obstetric and Gynecology  HD #3  Subjective  Patient doing okay. Reports nausea and abdominal discomfort with meals, no vomiting. States that she is only eating a few bites at meals due to discomfort. Ambulating in the room without difficulty, but has not been up in the hallways. She had 3-4 BMs yesterday and 1 today. She is voiding spontaneously without issue.   Denies CP, SOB, F/C, V/D, or leg pain.   Objective  Objective:   Vitals:   02/20/17 2023 02/21/17 0500 02/21/17 0632 02/21/17 0900  BP:   (!) 129/57 (!) 137/55  Pulse:   73 88  Resp:   16 16  Temp:  98.3 F (36.8 C) 98.1 F (36.7 C) 98.4 F (36.9 C)  TempSrc:  Oral Oral Oral  SpO2: 94%  95% 93%  Weight:      Height:       Temp:  [97.8 F (36.6 C)-98.7 F (37.1 C)] 98.4 F (36.9 C) (12/15 0900) Pulse Rate:  [73-88] 88 (12/15 0900) Resp:  [16-20] 16 (12/15 0900) BP: (112-137)/(49-74) 137/55 (12/15 0900) SpO2:  [93 %-98 %] 93 % (12/15 0900) I/O last 3 completed shifts: In: 1915 [P.O.:240; I.V.:1375; IV Piggyback:300] Out: -  No intake/output data recorded.  Intake/Output Summary (Last 24 hours) at 02/21/2017 1050 Last data filed at 02/20/2017 1700 Gross per 24 hour  Intake 240 ml  Output -  Net 240 ml     Current Vital Signs 24h Vital Sign Ranges  T 98.4 F (36.9 C) Temp  Avg: 98.3 F (36.8 C)  Min: 97.8 F (36.6 C)  Max: 98.7 F (37.1 C)  BP (!) 137/55 BP  Min: 112/50  Max: 137/55  HR 88 Pulse  Avg: 78.8  Min: 73  Max: 88  RR 16 Resp  Avg: 17.4  Min: 16  Max: 20  SaO2 93 % Not Delivered SpO2  Avg: 94.8 %  Min: 93 %  Max: 98 %           24 Hour I/O Current Shift I/O  Time Ins Outs 12/14 0701 - 12/15 0700 In: 240 [P.O.:240] Out: -  No intake/output data recorded.   General: mild distress, pleasant and cooperative Cardiovascular: RRR, no murmurs Pulmonary: CTAB, normal respiratory effort Abdomen: Benign. Mild tenderness in LLQ, otherwise non-tender, +BS, no guarding.  Extremities: No  erythema or cords, no calf tenderness, with normal peripheral pulses.  Labs: Results for orders placed or performed during the hospital encounter of 02/19/17 (from the past 24 hour(s))  Basic metabolic panel     Status: Abnormal   Collection Time: 02/21/17  6:53 AM  Result Value Ref Range   Sodium 138 135 - 145 mmol/L   Potassium 3.6 3.5 - 5.1 mmol/L   Chloride 106 101 - 111 mmol/L   CO2 23 22 - 32 mmol/L   Glucose, Bld 112 (H) 65 - 99 mg/dL   BUN 15 6 - 20 mg/dL   Creatinine, Ser 0.96 0.44 - 1.00 mg/dL   Calcium 8.8 (L) 8.9 - 10.3 mg/dL   GFR calc non Af Amer >60 >60 mL/min   GFR calc Af Amer >60 >60 mL/min   Anion gap 9 5 - 15    Cultures: Results for orders placed or performed during the hospital encounter of 02/19/17  Urine Culture     Status: Abnormal   Collection Time: 02/19/17  8:15 AM  Result Value Ref Range Status   Specimen Description URINE, RANDOM  Final   Special Requests NONE  Final   Culture MULTIPLE SPECIES PRESENT, SUGGEST RECOLLECTION (A)  Final   Report Status 02/20/2017 FINAL  Final  Blood culture (routine x 2)     Status: None (Preliminary result)   Collection Time: 02/19/17  9:54 AM  Result Value Ref Range Status   Specimen Description BLOOD RIGHT ANTECUBITAL  Final   Special Requests   Final    BOTTLES DRAWN AEROBIC AND ANAEROBIC Blood Culture results may not be optimal due to an inadequate volume of blood received in culture bottles   Culture NO GROWTH 2 DAYS  Final   Report Status PENDING  Incomplete  Blood culture (routine x 2)     Status: None (Preliminary result)   Collection Time: 02/19/17  9:59 AM  Result Value Ref Range Status   Specimen Description BLOOD BLOOD RIGHT HAND  Final   Special Requests   Final    BOTTLES DRAWN AEROBIC AND ANAEROBIC Blood Culture results may not be optimal due to an inadequate volume of blood received in culture bottles   Culture NO GROWTH 2 DAYS  Final   Report Status PENDING  Incomplete    Imaging: Dg Abd 1  View  Result Date: 02/19/2017 CLINICAL DATA:  Abdominal pain and constipation EXAM: ABDOMEN - 1 VIEW COMPARISON:  CT abdomen pelvis 02/19/2017 FINDINGS: Nonspecific bowel gas pattern. There are air-fluid levels in the right mid abdomen, nonspecific. No dilated bowel is visible. IMPRESSION: No radiographic evidence of small-bowel obstruction. Normal stool volume. Electronically Signed   By: Ulyses Jarred M.D.   On: 02/19/2017 22:33   Ct Abdomen Pelvis W Contrast  Result Date: 02/19/2017 CLINICAL DATA:  Nausea and pelvic pain since a hysterectomy 02/06/2017. EXAM: CT ABDOMEN AND PELVIS WITH CONTRAST TECHNIQUE: Multidetector CT imaging of the abdomen and pelvis was performed using the standard protocol following bolus administration of intravenous contrast. CONTRAST:  140mL ISOVUE-300 IOPAMIDOL (ISOVUE-300) INJECTION 61% COMPARISON:  CT scan 02/01/2005 FINDINGS: Lower chest: The lung bases are clear of acute process. No pleural effusion or pulmonary lesions. The heart is normal in size. No pericardial effusion. The distal esophagus and aorta are unremarkable. Hepatobiliary: No focal hepatic lesions or intrahepatic biliary dilatation. The gallbladder is normal. No common bile duct dilatation. Pancreas: No mass, inflammation or ductal dilatation. Spleen: Normal size. No focal lesions. Moderate splenic artery calcifications near the splenic hilum. Adrenals/Urinary Tract: The adrenal glands and kidneys are unremarkable. Fetal lobulations versus remote cortical scarring. Stomach/Bowel: The stomach, duodenum, small bowel and colon are grossly normal without oral contrast. No inflammatory changes, mass lesions or obstructive findings. The terminal ileum and appendix are normal. Moderate stool in the rectum is noted. Vascular/Lymphatic: Moderate atherosclerotic calcifications involving the aorta and iliac arteries but no aneurysm or dissection. The major venous structures are patent. Small scattered mesenteric and  retroperitoneal lymph nodes but no mass or adenopathy. Reproductive: Status post hysterectomy. Minimal fluid in the cul de sac is probably postoperative liquified hematoma. No findings suspicious for an abscess. Other: No inguinal mass or adenopathy. No abdominal wall hernia or subcutaneous lesions. Musculoskeletal: No significant bony findings. There are advanced facet degenerative changes noted in the lumbar spine. IMPRESSION: 1. Postoperative changes in the pelvis from the recent hysterectomy but no findings suspicious for abscess or hematoma. 2. Moderate stool noted in the rectum. 3. No other significant abdominal/pelvic findings. Electronically Signed   By: Marijo Sanes M.D.   On: 02/19/2017 09:39     Assessment   66 y.o. [facility] Day: 3 with postop  infection of unknown location/source  Plan   1. Doing okay, but nausea and abdominal discomfort persist along with decreased PO intake. Ordered simethicone PRN and Reglan 10mg  TID x 2 weeks.  2. Stay on bowel regimen to have daily BMs. 2. Remains afebrile with VSS last 24 hours. ABX switched from IV to PO Augmentin last night. Continue this for a total of 14 days.   3. Blood cultures with no growth over 2 days, final result pending. 4. Kidney function:  Baseline 1.0-1.09 (in May 2016). Cr today 0.96.  CT scan negative for ureter injury or hydronephrosis.   5. Plan for possible discharge to home this afternoon, depending on activity, pain, and nausea.   Patient case and progress reviewed with Dr. Benjaman Kindler, who is in agreement with this plan.    Lisette Grinder CNM 02/21/2017 10:51 AM    Lisette Grinder Certified Nurse Midwife [facility]

## 2017-02-24 LAB — CULTURE, BLOOD (ROUTINE X 2)
CULTURE: NO GROWTH
Culture: NO GROWTH

## 2017-03-12 ENCOUNTER — Inpatient Hospital Stay: Payer: Medicare HMO

## 2017-03-26 ENCOUNTER — Encounter: Payer: Self-pay | Admitting: Hematology and Oncology

## 2017-03-26 ENCOUNTER — Other Ambulatory Visit: Payer: Self-pay

## 2017-03-26 ENCOUNTER — Other Ambulatory Visit: Payer: Self-pay | Admitting: Hematology and Oncology

## 2017-03-26 ENCOUNTER — Inpatient Hospital Stay (HOSPITAL_BASED_OUTPATIENT_CLINIC_OR_DEPARTMENT_OTHER): Payer: Medicare HMO | Admitting: Hematology and Oncology

## 2017-03-26 ENCOUNTER — Inpatient Hospital Stay: Payer: Medicare HMO | Attending: Hematology and Oncology

## 2017-03-26 VITALS — BP 144/84 | HR 84 | Temp 97.7°F | Resp 16 | Ht 64.0 in | Wt 232.0 lb

## 2017-03-26 DIAGNOSIS — Z95828 Presence of other vascular implants and grafts: Secondary | ICD-10-CM

## 2017-03-26 DIAGNOSIS — R17 Unspecified jaundice: Secondary | ICD-10-CM

## 2017-03-26 DIAGNOSIS — C50212 Malignant neoplasm of upper-inner quadrant of left female breast: Secondary | ICD-10-CM

## 2017-03-26 DIAGNOSIS — Z87891 Personal history of nicotine dependence: Secondary | ICD-10-CM | POA: Insufficient documentation

## 2017-03-26 DIAGNOSIS — Z853 Personal history of malignant neoplasm of breast: Secondary | ICD-10-CM

## 2017-03-26 DIAGNOSIS — Z79899 Other long term (current) drug therapy: Secondary | ICD-10-CM | POA: Insufficient documentation

## 2017-03-26 DIAGNOSIS — Z171 Estrogen receptor negative status [ER-]: Principal | ICD-10-CM

## 2017-03-26 DIAGNOSIS — Z923 Personal history of irradiation: Secondary | ICD-10-CM

## 2017-03-26 DIAGNOSIS — Z7982 Long term (current) use of aspirin: Secondary | ICD-10-CM | POA: Diagnosis not present

## 2017-03-26 LAB — COMPREHENSIVE METABOLIC PANEL
ALT: 21 U/L (ref 14–54)
AST: 30 U/L (ref 15–41)
Albumin: 4.1 g/dL (ref 3.5–5.0)
Alkaline Phosphatase: 42 U/L (ref 38–126)
Anion gap: 10 (ref 5–15)
BUN: 14 mg/dL (ref 6–20)
CO2: 25 mmol/L (ref 22–32)
Calcium: 9.2 mg/dL (ref 8.9–10.3)
Chloride: 103 mmol/L (ref 101–111)
Creatinine, Ser: 0.93 mg/dL (ref 0.44–1.00)
GFR calc Af Amer: >60 mL/min (ref >60)
GFR calc non Af Amer: >60 mL/min (ref >60)
Glucose, Bld: 116 mg/dL — ABNORMAL HIGH (ref 65–99)
Potassium: 3.7 mmol/L (ref 3.5–5.1)
Sodium: 138 mmol/L (ref 135–145)
Total Bilirubin: 1 mg/dL (ref 0.3–1.2)
Total Protein: 7.3 g/dL (ref 6.5–8.1)

## 2017-03-26 LAB — CBC WITH DIFFERENTIAL/PLATELET
Basophils Absolute: 0.1 10*3/uL (ref 0–0.1)
Basophils Relative: 1 %
Eosinophils Absolute: 0.2 10*3/uL (ref 0–0.7)
Eosinophils Relative: 3 %
HCT: 43.1 % (ref 35.0–47.0)
Hemoglobin: 14.4 g/dL (ref 12.0–16.0)
Lymphocytes Relative: 47 %
Lymphs Abs: 3.3 10*3/uL (ref 1.0–3.6)
MCH: 30 pg (ref 26.0–34.0)
MCHC: 33.5 g/dL (ref 32.0–36.0)
MCV: 89.7 fL (ref 80.0–100.0)
Monocytes Absolute: 0.5 10*3/uL (ref 0.2–0.9)
Monocytes Relative: 8 %
Neutro Abs: 2.9 10*3/uL (ref 1.4–6.5)
Neutrophils Relative %: 41 %
Platelets: 148 10*3/uL — ABNORMAL LOW (ref 150–440)
RBC: 4.8 MIL/uL (ref 3.80–5.20)
RDW: 13.2 % (ref 11.5–14.5)
WBC: 7.1 10*3/uL (ref 3.6–11.0)

## 2017-03-26 LAB — BILIRUBIN, DIRECT: Bilirubin, Direct: <0.1 mg/dL — ABNORMAL LOW (ref 0.1–0.5)

## 2017-03-26 MED ORDER — SODIUM CHLORIDE 0.9% FLUSH
10.0000 mL | INTRAVENOUS | Status: AC | PRN
  Administered 2017-03-26: 10 mL
  Filled 2017-03-26: qty 10

## 2017-03-26 MED ORDER — HEPARIN SOD (PORK) LOCK FLUSH 100 UNIT/ML IV SOLN
500.0000 [IU] | INTRAVENOUS | Status: AC | PRN
  Administered 2017-03-26: 500 [IU]

## 2017-03-26 NOTE — Progress Notes (Signed)
[facility] day:  03/26/2017  Chief Complaint: Madison Christensen is a 67 y.o. female with stage IA Her2/neu + left breast cancer who is seen for 4 month assessment.  HPI:  The patient was last seen in the medical oncology clinic on 11/24/2016.  At that time, she had "stomach problems" x 1 week.  She had urinary frequency and burning.  Bilirubin was 1.5 (direct 0.1). CBC and chemistries were unremarkable. Exam revealed a tiny palpable mass in the RIGHT breast lateral to the reduction scar.  Diagnostic right mammogram and ultrasound on 12/01/2016 revealed normal appearing breast tissue and postreduction scar in the area of clinical concern in the lower outer right breast.  No mass was seen.  She underwent total laparoscopic hysterectomy and bilateral oophorectomy on 02/06/2017 for pelvic pain, adnexal mass, and postmenopausal bleeding by Dr. Vikki Ports Ward.  Pathology revealed no evidence of atypia or malignancy.  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  She was admitted from 02/19/2017 - 02/21/2017 with abdominal pain, constipation, nausea and generalized fatigue and weakness. She felt "unsteady on" her feet and required a wheelchair.  WBC was 17,000.  She was treated with vancomycin and Zosyn.  She was discharged on Augmentin.  During the interim, she has done well.  She denies any breast concerns.  She notes pain medications caused constipation.  She takes stool softeners BID.     Past Medical History:  Diagnosis Date  . Breast cancer ([place]) 2016   left  . Cancer ([place])    skin   . Carcinoma of left breast ([place]) 07/30/2014   1.4 cm, T1c, N0, ER negative, PR negative, HER-2 positive  . Chronic kidney disease    h/o renal insuff  . Complication of anesthesia 2004   oversensitive to noise, lights  . COPD (chronic obstructive pulmonary disease) ([place])   . Depression 1995  . GERD  (gastroesophageal reflux disease)   . Hyperlipidemia   . Hypertension     Past Surgical History:  Procedure Laterality Date  . ABDOMINAL HYSTERECTOMY    . AXILLARY LYMPH NODE BIOPSY Left 08/03/2014   Procedure: AXILLARY LYMPH NODE BIOPSY;  Surgeon: Robert Bellow, MD;  Location: [facility] ORS;  Service: General;  Laterality: Left;  . BACK SURGERY    . BREAST BIOPSY Left   . BREAST LUMPECTOMY Left 2016  . BREAST LUMPECTOMY WITH AXILLARY LYMPH NODE DISSECTION Left 08/03/2014   Procedure: BREAST LUMPECTOMY WITH AXILLARY LYMPH NODE DISSECTION;  Surgeon: Robert Bellow, MD;  Location: [facility] ORS;  Service: General;  Laterality: Left;  . BREAST SURGERY Left Aug 03, 2014   Wide excision, sentinel node biopsy.  . CHOLECYSTECTOMY    . COLONOSCOPY  2013   Dr. Vira Agar  . COLONOSCOPY WITH PROPOFOL N/A 05/30/2015   Procedure: COLONOSCOPY WITH PROPOFOL;  Surgeon: Manya Silvas, MD;  Location: [facility] ENDOSCOPY;  Service: Endoscopy;  Laterality: N/A;  . DILATION AND CURETTAGE OF UTERUS N/A 01/16/2017   Procedure: DILATATION AND CURETTAGE;  Surgeon: Ward, Honor Loh, MD;  Location: [facility] ORS;  Service: Gynecology;  Laterality: N/A;  . LAPAROSCOPIC BILATERAL SALPINGO OOPHERECTOMY Bilateral 02/06/2017   Procedure: LAPAROSCOPIC BILATERAL SALPINGO OOPHORECTOMY;  Surgeon: Ward, Honor Loh, MD;  Location: [facility] ORS;  Service: Gynecology;  Laterality: Bilateral;  . LAPAROSCOPIC HYSTERECTOMY N/A 02/06/2017   Procedure: HYSTERECTOMY TOTAL LAPAROSCOPIC;  Surgeon: Ward, Honor Loh, MD;  Location: [facility] ORS;  Service: Gynecology;  Laterality: N/A;  .  PORTACATH PLACEMENT Right 08/31/2014   Procedure: INSERTION PORT-A-CATH;  Surgeon: Robert Bellow, MD;  Location: [facility] ORS;  Service: General;  Laterality: Right;  . REDUCTION MAMMAPLASTY Bilateral 2016  . SENTINEL NODE BIOPSY Left 08/03/2014   Procedure: SENTINEL NODE BIOPSY;  Surgeon: Robert Bellow, MD;  Location: [facility] ORS;  Service: General;  Laterality: Left;  . skin  cancer  removal  2015   nose   . TONSILLECTOMY      Family History  Problem Relation Age of Onset  . Breast cancer Mother 7          . Breast cancer Cousin 62            Social History:  reports that she quit smoking about 40 years ago. Her smoking use included cigarettes. She has a 2.00 pack-year smoking history. she has never used smokeless tobacco. She reports that she drinks alcohol. She reports that she does not use drugs.  She is retired from [employer].  She lives in [place].  Her husband died 4 years ago.  Her sister's name is Arville Go.  The patient is alone today.  Allergies:  Allergies  Allergen Reactions  . Losartan Other (See Comments)    Sick on stomach  . Lovastatin Other (See Comments)    Per pt headache  . Sulfa Antibiotics Hives and Rash    Current Medications: Current Outpatient Medications  Medication Sig Dispense Refill  . acetaminophen-codeine (TYLENOL #3) 300-30 MG tablet Take 1-2 tablets by mouth every 4 (four) hours as needed for moderate pain. 21 tablet 0  . albuterol (PROVENTIL HFA;VENTOLIN HFA) 108 (90 Base) MCG/ACT inhaler Inhale 2 puffs into the lungs every 6 (six) hours as needed for wheezing or shortness of breath. 1 Inhaler 2  . aspirin EC 81 MG tablet Take 81 mg daily by mouth.     . cholecalciferol (VITAMIN D) 1000 units tablet Take 1,000 Units by mouth daily.    . citalopram (CELEXA) 20 MG tablet TAKE 1 TABLET (20 MG TOTAL) BY MOUTH DAILY. (Patient taking differently: Take 30 mg daily by mouth. ) 30 tablet 5  . gabapentin (NEURONTIN) 100 MG capsule TAKE 1 CAPSULE BY MOUTH THREE TIMES DAILY (Patient taking differently: Take 200 mg by mouth in the morning and take 100 mg by mouth at bedtime) 90 capsule 1  . Ipratropium-Albuterol (COMBIVENT) 20-100 MCG/ACT AERS respimat Inhale 2 puffs 2 (two) times daily into the lungs.     . Multiple Vitamin (MULTIVITAMIN) tablet Take 2 tablets daily by mouth.     . NON FORMULARY Apply 1 application 2 (two) times  daily topically. Unscented Free-up Professional Massage Cream Mixed with Engineer, technical sales    . ondansetron (ZOFRAN) 4 MG tablet Take 1 tablet (4 mg total) by mouth every 6 (six) hours as needed for nausea. 10 tablet 0  . Polyethyl Glycol-Propyl Glycol (SYSTANE ULTRA) 0.4-0.3 % SOLN Place 1 drop as needed into both eyes (for dry eyes).    . pravastatin (PRAVACHOL) 40 MG tablet Take 40 mg at bedtime by mouth.   3  . spironolactone (ALDACTONE) 25 MG tablet Take 25 mg by mouth 2 (two) times daily.      No current facility-administered medications for this visit.    Facility-Administered Medications Ordered in Other Visits  Medication Dose Route Frequency Provider Last Rate Last Dose  . 0.9 %  sodium chloride infusion   Intravenous Continuous Manya Silvas, MD      . diphenhydrAMINE (BENADRYL) capsule 25  mg  25 mg Oral Once Choksi, Delorise Shiner, MD      . sodium chloride 0.9 % injection 10 mL  10 mL Intravenous PRN Forest Gleason, MD   10 mL at 09/25/14 1418  . sodium chloride 0.9 % injection 10 mL  10 mL Intravenous PRN Forest Gleason, MD   10 mL at 01/15/15 1325  . sodium chloride flush (NS) 0.9 % injection 10 mL  10 mL Intravenous PRN Lequita Asal, MD   10 mL at 07/22/16 1011    Review of Systems:  GENERAL:  Feels "ok".  No fevers or sweats.  Weight up 1 pound. PERFORMANCE STATUS (ECOG): 1 HEENT:  No visual changes, runny nose, sore throat, mouth sores or tenderness. Lungs: Shortness of breath with exertion.  No cough.  No hemoptysis. Cardiac:  No chest pain, palpitations, orthopnea, or PND. GI:  Pain medications cause constipation.  No nausea, vomiting, diarrhea, constipation, melena or hematochezia.  Colonoscopy 05/30/2015. GU:  Dysuria with frequency. Suprapubic tenderness. No CVA tenderness. No urgency or hematuria. Musculoskeletal:  No back pain.  Joints ache.  No muscle tenderness. Extremities:  No pain or swelling. Skin:  No rashes or skin changes. Neuro:  Neuropathy in fingers and toes  (once in awhile).  No headache, weakness, balance or coordination issues. Endocrine:  No diabetes, thyroid issues, hot flashes or night sweats. Psych:  No mood changes, depression or anxiety. Pain:  No focal pain. Review of systems:  All other systems reviewed and found to be negative.  Physical Exam: Blood pressure (!) 144/84, pulse 84, temperature 97.7 F (36.5 C), temperature source Tympanic, resp. rate 16, height 5' 4"  (1.626 m), weight 232 lb (105.2 kg). GENERAL:  Well developed, well nourished, woman sitting comfortably in the exam room in no acute distress. MENTAL STATUS:  Alert and oriented to person, place and time. HEAD:  Short white hair.  Normocephalic, atraumatic, face symmetric, no Cushingoid features. EYES:  Glasses.  Blue eyes.  Pupils equal round and reactive to light and accomodation.  No conjunctivitis or scleral icterus. ENT:  Oropharynx clear without lesion.  Tongue normal. Mucous membranes moist.  RESPIRATORY:  Clear to auscultation without rales, wheezes or rhonchi. CARDIOVASCULAR:  Regular rate and rhythm without murmur, rub or gallop. BREAST:  Right breast tender.  No masses, skin changes or nipple discharge.  Left breast with a 4 cm area of post-operative and post radiation changes at 11 - 1 o'clock position.  Mild inferior breast edema.  No masses, skin changes or nipple discharge.  ABDOMEN:  Soft, non-tender, with active bowel sounds, and no hepatosplenomegaly.  No guarding or rebound tenderness.  No masses. SKIN:  Keloid right upper chest.  No rashes, ulcers or lesions. EXTREMITIES: No edema, no skin discoloration or tenderness.  No palpable cords. LYMPH NODES: No palpable cervical, supraclavicular, axillary or inguinal adenopathy  PSYCH:  Appropriate.   Infusion on 03/26/2017  Component Date Value Ref Range Status  . Bilirubin, Direct 03/26/2017 <0.1* 0.1 - 0.5 mg/dL Final   Performed at [facility], [address] [place],  [address]  . CA  27.29 03/26/2017 15.5  0.0 - 38.6 U/mL Final   Comment: (NOTE) Siemens Centaur Immunochemiluminometric Methodology [facility], [place]) Values obtained with different assay methods or kits cannot be used interchangeably. Results cannot be interpreted as absolute evidence of the presence or absence of malignant disease. Performed At: [facility] [place], [place] [id number] Rush Farmer MD IO:[id number] Performed at [facility], [address]  [address] [place], [place] [address]   . Sodium 03/26/2017 138  135 - 145 mmol/L Final  . Potassium 03/26/2017 3.7  3.5 - 5.1 mmol/L Final  . Chloride 03/26/2017 103  101 - 111 mmol/L Final  . CO2 03/26/2017 25  22 - 32 mmol/L Final  . Glucose, Bld 03/26/2017 116* 65 - 99 mg/dL Final  . BUN 03/26/2017 14  6 - 20 mg/dL Final  . Creatinine, Ser 03/26/2017 0.93  0.44 - 1.00 mg/dL Final  . Calcium 03/26/2017 9.2  8.9 - 10.3 mg/dL Final  . Total Protein 03/26/2017 7.3  6.5 - 8.1 g/dL Final  . Albumin 03/26/2017 4.1  3.5 - 5.0 g/dL Final  . AST 03/26/2017 30  15 - 41 U/L Final  . ALT 03/26/2017 21  14 - 54 U/L Final  . Alkaline Phosphatase 03/26/2017 42  38 - 126 U/L Final  . Total Bilirubin 03/26/2017 1.0  0.3 - 1.2 mg/dL Final  . GFR calc non Af Amer 03/26/2017 >60  >60 mL/min Final  . GFR calc Af Amer 03/26/2017 >60  >60 mL/min Final   Comment: (NOTE) The eGFR has been calculated using the CKD EPI equation. This calculation has not been validated in all clinical situations. eGFR's persistently <60 mL/min signify possible Chronic Kidney Disease.   Georgiann Hahn gap 03/26/2017 10  5 - 15 Final   Performed at [facility], [address] [place], [place] [address]  . WBC 03/26/2017 7.1  3.6 - 11.0 K/uL Final  . RBC 03/26/2017 4.80  3.80 - 5.20 MIL/uL Final  . Hemoglobin 03/26/2017 14.4  12.0 - 16.0 g/dL Final  . HCT 03/26/2017 43.1  35.0 - 47.0 % Final  . MCV 03/26/2017 89.7  80.0 - 100.0 fL Final  . MCH 03/26/2017 30.0  26.0 -  34.0 pg Final  . MCHC 03/26/2017 33.5  32.0 - 36.0 g/dL Final  . RDW 03/26/2017 13.2  11.5 - 14.5 % Final  . Platelets 03/26/2017 148* 150 - 440 K/uL Final  . Neutrophils Relative % 03/26/2017 41  % Final  . Neutro Abs 03/26/2017 2.9  1.4 - 6.5 K/uL Final  . Lymphocytes Relative 03/26/2017 47  % Final  . Lymphs Abs 03/26/2017 3.3  1.0 - 3.6 K/uL Final  . Monocytes Relative 03/26/2017 8  % Final  . Monocytes Absolute 03/26/2017 0.5  0.2 - 0.9 K/uL Final  . Eosinophils Relative 03/26/2017 3  % Final  . Eosinophils Absolute 03/26/2017 0.2  0 - 0.7 K/uL Final  . Basophils Relative 03/26/2017 1  % Final  . Basophils Absolute 03/26/2017 0.1  0 - 0.1 K/uL Final   Performed at [facility], [address] [place], [place] [address]    Assessment:  BLISS BEHNKE is a 67 y.o. female with stage IA Her2/neu + left breast cancer status wide excision on  08/03/2014.  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  Lymphvascular invasion was indeterminate (+ on original biopsy).  DCIS was present.  Three sentinel lymph nodes were negative.  Tumor was ER -, PR -, and Her2/neu 3+ by IHC.  Pathologic stage was [id number].  She received adjuvant Taxol and Herceptin.  She received 10 of 12 weeks of Taxol (09/04/2014 - 11/06/2014).  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  She received Herceptin x 6 months (09/04/2014 - 03/27/2015).  Herceptin was discontinued secondary to myalgias.  She completed radiation therapy in 02/2015.  Bilateral mammogram on 07/18/2015 revealed  no evidence of malignancy.  Bilateral mammogram on 07/22/2016 was benign.  There were scattered areas of fibroglandular density.  Post surgical changes were noted in the left breast.  Diagnostic right mammogram and ultrasound on 12/01/2016 revealed normal appearing breast tissue and postreduction scar in the area of clinical concern in the lower outer right breast.  No mass was seen.  CA27.29 has  been followed: 16.3 on 03/11/2016, 9.8 on 07/22/2016, 18.6 on 11/24/2016, and 15.5 on 03/26/2017.  Bone density study on 05/12/2016 revealed osteopenia with a T score of -1.1 in the right femur and -0.4 in the AP spine L1-L2.  She underwent total laparoscopic hysterectomy and bilateral oophorectomy on 02/06/2017 for pelvic pain, adnexal mass, and postmenopausal bleeding. Pathology revealed no evidence of atypia or malignancy.  She has a history on an elevated bilirubin (indirect).  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  Symptomatically, she denies any concerns.  Exam reveals stable post-operative changes.  Plan: 1.  Labs today:  CBC with diff, CMP, direct bilirubin, CA27.29. 2.  Discuss interval mammogram and ultrasound- no evidence of malignancy. 3.  Discuss interval hysterectomy and oophorectomy. 4.  Schedule bilateral mammogram on 07/22/2017. 5.  Continue port flushes every 6-8 weeks until removal. 6.  Discuss port removal.  Will refer patient to back to Dr. Bary Castilla. 7.  RTC in 6 months for MD assessment and labs (CBC with diff, CMP, CA27.29).   Lequita Asal, MD  03/26/2017, 3:35 PM

## 2017-03-26 NOTE — Progress Notes (Signed)
Patient here for follow up. See follow up. 

## 2017-03-27 LAB — CANCER ANTIGEN 27.29: CA 27.29: 15.5 U/mL (ref 0.0–38.6)

## 2017-03-28 ENCOUNTER — Encounter: Payer: Self-pay | Admitting: Hematology and Oncology

## 2017-05-07 ENCOUNTER — Inpatient Hospital Stay: Payer: Medicare HMO | Attending: Hematology and Oncology

## 2017-06-18 ENCOUNTER — Inpatient Hospital Stay: Payer: Medicare Other | Attending: Hematology and Oncology

## 2017-06-18 DIAGNOSIS — Z452 Encounter for adjustment and management of vascular access device: Secondary | ICD-10-CM | POA: Insufficient documentation

## 2017-06-18 DIAGNOSIS — Z853 Personal history of malignant neoplasm of breast: Secondary | ICD-10-CM | POA: Diagnosis present

## 2017-06-18 DIAGNOSIS — Z95828 Presence of other vascular implants and grafts: Secondary | ICD-10-CM

## 2017-06-18 MED ORDER — SODIUM CHLORIDE 0.9% FLUSH
10.0000 mL | INTRAVENOUS | Status: AC | PRN
  Administered 2017-06-18: 10 mL
  Filled 2017-06-18: qty 10

## 2017-06-18 MED ORDER — HEPARIN SOD (PORK) LOCK FLUSH 100 UNIT/ML IV SOLN
500.0000 [IU] | INTRAVENOUS | Status: AC | PRN
  Administered 2017-06-18: 500 [IU]

## 2017-07-30 ENCOUNTER — Inpatient Hospital Stay: Payer: 59 | Attending: Hematology and Oncology

## 2017-08-19 ENCOUNTER — Ambulatory Visit
Admission: RE | Admit: 2017-08-19 | Discharge: 2017-08-19 | Disposition: A | Discharge status: Home / Self Care | Payer: Medicare Other | Source: Ambulatory Visit | Attending: Urgent Care | Admitting: Urgent Care

## 2017-08-19 DIAGNOSIS — C50212 Malignant neoplasm of upper-inner quadrant of left female breast: Secondary | ICD-10-CM

## 2017-08-19 DIAGNOSIS — Z171 Estrogen receptor negative status [ER-]: Secondary | ICD-10-CM | POA: Insufficient documentation

## 2017-08-19 HISTORY — DX: Personal history of irradiation: Z92.3

## 2017-09-02 NOTE — Progress Notes (Signed)
[facility] day:  09/03/17  Chief Complaint: Madison Christensen is a 67 y.o. female with stage IA Her2/neu + left breast cancer who is seen for 6 month assessment.  HPI:  The patient was last seen in the medical oncology clinic on 03/26/2017.  At that time, patient had been doing well. She was having some opioid induced constipation and was using stool softeners BID.  Exam revealed stable post-operative changes. Discussed port removal.   Routine mammogram done on 08/19/2017 revealed mild evolving fat necrosis with dystrophic calcifications in the lower outer aspect of the RIGHT breast. There was no mammographic evidence of malignancy in either breast.  In the interim, patient is doing well today, and does not express any acute concerns. Shortness of breath has improved. She is trying to be more active. 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 Patient denies B symptoms.  Patient does not verbalize any concerns with regards to her breasts today. Patient  does perform monthly self breast examinations as recommended.  She has not experienced any significant interval infections. Patient has not had port removed at this point.   Patient advises that she maintains an adequate appetite. She is eating well. Weight today is 234 lb 2 oz (106.2 kg), which compared to her last visit to the clinic, represents a  2 pound increase.   Patient denies pain in the clinic today.   Past Medical History:  Diagnosis Date  . Breast cancer ([place]) 2016   left  . Cancer ([place])    skin   . Carcinoma of left breast ([place]) 07/30/2014   1.4 cm, T1c, N0, ER negative, PR negative, HER-2 positive  . Chronic kidney disease    h/o renal insuff  . Complication of anesthesia 2004   oversensitive to noise, lights  . COPD (chronic obstructive pulmonary disease) ([place])   . Depression 1995  . GERD (gastroesophageal reflux disease)   . Hyperlipidemia   . Hypertension   . Personal history of  radiation therapy 2016   LEFT lumpectomy    Past Surgical History:  Procedure Laterality Date  . ABDOMINAL HYSTERECTOMY    . AXILLARY LYMPH NODE BIOPSY Left 08/03/2014   Procedure: AXILLARY LYMPH NODE BIOPSY;  Surgeon: Robert Bellow, MD;  Location: [facility] ORS;  Service: General;  Laterality: Left;  . BACK SURGERY    . BREAST BIOPSY Left   . 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 BREAST LUMPECTOMY WITH AXILLARY LYMPH NODE DISSECTION Left 08/03/2014   Procedure: BREAST LUMPECTOMY WITH AXILLARY LYMPH NODE DISSECTION;  Surgeon: Robert Bellow, MD;  Location: [facility] ORS;  Service: General;  Laterality: Left;  . BREAST SURGERY Left Aug 03, 2014   Wide excision, sentinel node biopsy.  . CHOLECYSTECTOMY    . COLONOSCOPY  2013   Dr. Vira Agar  . COLONOSCOPY WITH PROPOFOL N/A 05/30/2015   Procedure: COLONOSCOPY WITH PROPOFOL;  Surgeon: Manya Silvas, MD;  Location: [facility] ENDOSCOPY;  Service: Endoscopy;  Laterality: N/A;  . DILATION AND CURETTAGE OF UTERUS N/A 01/16/2017   Procedure: DILATATION AND CURETTAGE;  Surgeon: Ward, Honor Loh, MD;  Location: [facility] ORS;  Service: Gynecology;  Laterality: N/A;  . LAPAROSCOPIC BILATERAL SALPINGO OOPHERECTOMY Bilateral 02/06/2017   Procedure: LAPAROSCOPIC BILATERAL SALPINGO OOPHORECTOMY;  Surgeon: Ward, Honor Loh, MD;  Location: [facility] ORS;  Service: Gynecology;  Laterality: Bilateral;  . LAPAROSCOPIC HYSTERECTOMY N/A 02/06/2017   Procedure: HYSTERECTOMY TOTAL LAPAROSCOPIC;  Surgeon: Ward, Honor Loh, MD;  Location:  [facility] ORS;  Service: Gynecology;  Laterality: N/A;  . PORTACATH PLACEMENT Right 08/31/2014   Procedure: INSERTION PORT-A-CATH;  Surgeon: Robert Bellow, MD;  Location: [facility] ORS;  Service: General;  Laterality: Right;  . REDUCTION MAMMAPLASTY Bilateral 2016  . SENTINEL NODE BIOPSY Left 08/03/2014   Procedure: SENTINEL NODE BIOPSY;  Surgeon: Robert Bellow, MD;  Location: [facility] ORS;  Service: General;  Laterality: Left;  . skin  cancer  removal  2015   nose   . TONSILLECTOMY      Family History  Problem Relation Age of Onset  . Breast cancer Mother 75          . Breast cancer Cousin 62            Social History:  reports that she quit smoking about 41 years ago. Her smoking use included cigarettes. She has a 2.00 pack-year smoking history. She has never used smokeless tobacco. She reports that she drinks alcohol. She reports that she does not use drugs.  She is retired from [employer].  She lives in [place].  Her husband died 4 years ago.  Her sister's name is Arville Go.  The patient is alone today.  Allergies:  Allergies  Allergen Reactions  . Codeine Other (See Comments)    Severe constipation  . Losartan Other (See Comments)    Sick on stomach  . Lovastatin Other (See Comments)    Per pt headache  . Sulfa Antibiotics Hives and Rash    Current Medications: Current Outpatient Medications  Medication Sig Dispense Refill  . acetaminophen-codeine (TYLENOL #3) 300-30 MG tablet Take 1-2 tablets by mouth every 4 (four) hours as needed for moderate pain. 21 tablet 0  . albuterol (PROVENTIL HFA;VENTOLIN HFA) 108 (90 Base) MCG/ACT inhaler Inhale 2 puffs into the lungs every 6 (six) hours as needed for wheezing or shortness of breath. 1 Inhaler 2  . aspirin EC 81 MG tablet Take 81 mg daily by mouth.     . cholecalciferol (VITAMIN D) 1000 units tablet Take 1,000 Units by mouth daily.    . citalopram (CELEXA) 20 MG tablet TAKE 1 TABLET (20 MG TOTAL) BY MOUTH DAILY. (Patient taking differently: Take 30 mg daily by mouth. ) 30 tablet 5  . gabapentin (NEURONTIN) 100 MG capsule TAKE 1 CAPSULE BY MOUTH THREE TIMES DAILY (Patient taking differently: Take 200 mg by mouth in the morning and take 100 mg by mouth at bedtime) 90 capsule 1  . Multiple Vitamin (MULTIVITAMIN) tablet Take 2 tablets daily by mouth.     . NON FORMULARY Apply 1 application 2 (two) times daily topically. Unscented Free-up Professional Massage Cream  Mixed with Engineer, technical sales    . ondansetron (ZOFRAN) 4 MG tablet Take 1 tablet (4 mg total) by mouth every 6 (six) hours as needed for nausea. 10 tablet 0  . Polyethyl Glycol-Propyl Glycol (SYSTANE ULTRA) 0.4-0.3 % SOLN Place 1 drop as needed into both eyes (for dry eyes).    . pravastatin (PRAVACHOL) 40 MG tablet Take 40 mg at bedtime by mouth.   3  . spironolactone (ALDACTONE) 25 MG tablet Take 25 mg by mouth 2 (two) times daily.      No current facility-administered medications for this visit.    Facility-Administered Medications Ordered in Other Visits  Medication Dose Route Frequency Provider Last Rate Last Dose  . 0.9 %  sodium chloride infusion   Intravenous Continuous Manya Silvas, MD      . diphenhydrAMINE (BENADRYL) capsule 25  mg  25 mg Oral Once Choksi, Delorise Shiner, MD      . sodium chloride 0.9 % injection 10 mL  10 mL Intravenous PRN Forest Gleason, MD   10 mL at 09/25/14 1418  . sodium chloride 0.9 % injection 10 mL  10 mL Intravenous PRN Forest Gleason, MD   10 mL at 01/15/15 1325  . sodium chloride flush (NS) 0.9 % injection 10 mL  10 mL Intravenous PRN Nolon Stalls C, MD   10 mL at 07/22/16 1011    Review of Systems  Constitutional: Negative for diaphoresis, fever, malaise/fatigue and weight loss.       "I feel good. I am trying to be more active".   HENT: Negative.   Eyes: Negative.   Respiratory: Positive for shortness of breath (exertional). Negative for cough, hemoptysis and sputum production.   Cardiovascular: Negative for chest pain, palpitations, orthopnea, leg swelling and PND.  Gastrointestinal: Negative for abdominal pain, blood in stool, constipation, diarrhea, melena, nausea and vomiting.       Last colonoscopy 05/30/2015  Genitourinary: Negative for dysuria, frequency, hematuria and urgency.  Musculoskeletal: Negative for back pain, falls, joint pain and myalgias.  Skin: Negative for itching and rash.  Neurological: Positive for tingling (intermittent neuropathy  in toes >> fingers). Negative for dizziness, tremors, weakness and headaches.  Endo/Heme/Allergies: Does not bruise/bleed easily.  Psychiatric/Behavioral: Negative for depression, memory loss and suicidal ideas. The patient is not nervous/anxious and does not have insomnia.   All other systems reviewed and are negative.  Performance status (ECOG): 1 - Symptomatic but completely ambulatory  Physical Exam: Blood pressure (!) 153/83, pulse 80, temperature (!) 97.3 F (36.3 C), temperature source Tympanic, resp. rate 18, weight 234 lb 2 oz (106.2 kg). GENERAL:  Well developed, well nourished, woman sitting comfortably in the exam room in no acute distress. MENTAL STATUS:  Alert and oriented to person, place and time. HEAD:  White/blonde hair.  Normocephalic, atraumatic, face symmetric, no Cushingoid features. EYES:  Glasses.  Blue eyes.  Pupils equal round and reactive to light and accomodation.  No conjunctivitis or scleral icterus. ENT:  Oropharynx clear without lesion.  Tongue normal. Mucous membranes moist.  RESPIRATORY:  Clear to auscultation without rales, wheezes or rhonchi. CARDIOVASCULAR:  Regular rate and rhythm without murmur, rub or gallop. BREAST:  Right breast without masses, skin changes or nipple discharge.  Slight inferior right breast edema.  Left breast tender with moderate post-operative and post-radiation changes 11 - 12 o'clock.  No masses, skin changes or nipple discharge. ABDOMEN:  Soft, non-tender, with active bowel sounds, and no hepatosplenomegaly.  No masses. SKIN:  Keloid right upper chest.  No rashes, ulcers or lesions. EXTREMITIES: No edema, no skin discoloration or tenderness.  No palpable cords. LYMPH NODES: No palpable cervical, supraclavicular, axillary or inguinal adenopathy  NEUROLOGICAL: Unremarkable. PSYCH:  Appropriate.    Infusion on 09/03/2017  Component Date Value Ref Range Status  . Sodium 09/03/2017 139  135 - 145 mmol/L Final  . Potassium  09/03/2017 4.0  3.5 - 5.1 mmol/L Final  . Chloride 09/03/2017 106  98 - 111 mmol/L Final   Please note change in reference range.  . CO2 09/03/2017 24  22 - 32 mmol/L Final  . Glucose, Bld 09/03/2017 117* 70 - 99 mg/dL Final   Please note change in reference range.  . BUN 09/03/2017 15  8 - 23 mg/dL Final   Please note change in reference range.  . Creatinine, Ser 09/03/2017 1.02* 0.44 -  1.00 mg/dL Final  . Calcium 09/03/2017 9.1  8.9 - 10.3 mg/dL Final  . Total Protein 09/03/2017 7.2  6.5 - 8.1 g/dL Final  . Albumin 09/03/2017 4.2  3.5 - 5.0 g/dL Final  . AST 09/03/2017 29  15 - 41 U/L Final  . ALT 09/03/2017 24  0 - 44 U/L Final   Please note change in reference range.  . Alkaline Phosphatase 09/03/2017 49  38 - 126 U/L Final  . Total Bilirubin 09/03/2017 1.2  0.3 - 1.2 mg/dL Final  . GFR calc non Af Amer 09/03/2017 56* >60 mL/min Final  . GFR calc Af Amer 09/03/2017 >60  >60 mL/min Final   Comment: (NOTE) The eGFR has been calculated using the CKD EPI equation. This calculation has not been validated in all clinical situations. eGFR's persistently <60 mL/min signify possible Chronic Kidney Disease.   Georgiann Hahn gap 09/03/2017 9  5 - 15 Final   Performed at [facility], [address] [place], [place] [address]  . WBC 09/03/2017 6.3  3.6 - 11.0 K/uL Final  . RBC 09/03/2017 4.88  3.80 - 5.20 MIL/uL Final  . Hemoglobin 09/03/2017 14.9  12.0 - 16.0 g/dL Final  . HCT 09/03/2017 43.2  35.0 - 47.0 % Final  . MCV 09/03/2017 88.5  80.0 - 100.0 fL Final  . MCH 09/03/2017 30.5  26.0 - 34.0 pg Final  . MCHC 09/03/2017 34.5  32.0 - 36.0 g/dL Final  . RDW 09/03/2017 13.4  11.5 - 14.5 % Final  . Platelets 09/03/2017 170  150 - 440 K/uL Final  . Neutrophils Relative % 09/03/2017 49  % Final  . Neutro Abs 09/03/2017 3.1  1.4 - 6.5 K/uL Final  . Lymphocytes Relative 09/03/2017 41  % Final  . Lymphs Abs 09/03/2017 2.5  1.0 - 3.6 K/uL Final  . Monocytes Relative 09/03/2017 7  % Final  .  Monocytes Absolute 09/03/2017 0.4  0.2 - 0.9 K/uL Final  . Eosinophils Relative 09/03/2017 2  % Final  . Eosinophils Absolute 09/03/2017 0.1  0 - 0.7 K/uL Final  . Basophils Relative 09/03/2017 1  % Final  . Basophils Absolute 09/03/2017 0.1  0 - 0.1 K/uL Final   Performed at [facility], [address] [place], [place] [address]    Assessment:  JESSAH DANSER is a 67 y.o. female with stage IA Her2/neu + left breast cancer status wide excision on  08/03/2014.  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  Lymphvascular invasion was indeterminate (+ on original biopsy).  DCIS was present.  Three sentinel lymph nodes were negative.  Tumor was ER -, PR -, and Her2/neu 3+ by IHC.  Pathologic stage was [id number].  She received adjuvant Taxol and Herceptin.  She received 10 of 12 weeks of Taxol (09/04/2014 - 11/06/2014).  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  She received Herceptin x 6 months (09/04/2014 - 03/27/2015).  Herceptin was discontinued secondary to myalgias.  She completed radiation therapy in 02/2015.  Bilateral mammogram on 07/18/2015 revealed no evidence of malignancy.  Bilateral mammogram on 07/22/2016 was benign.  There were scattered areas of fibroglandular density.  Post surgical changes were noted in the left breast.  Diagnostic right mammogram and ultrasound on 12/01/2016 revealed normal appearing breast tissue and postreduction scar in the area of clinical concern in the lower outer right breast.  No mass was seen. Bilateral mammogram on 08/19/2017 revealed mild evolving fat necrosis with dystrophic  calcifications in the lower outer aspect of the RIGHT breast. There was no mammographic evidence of malignancy in either breast.  CA27.29 has been followed: 16.3 on 03/11/2016, 9.8 on 07/22/2016, 18.6 on 11/24/2016, and 15.5 on 03/26/2017.  Bone density study on 05/12/2016 revealed osteopenia with a T score of -1.1 in the right femur  and -0.4 in the AP spine L1-L2.  She underwent total laparoscopic hysterectomy and bilateral oophorectomy on 02/06/2017 for pelvic pain, adnexal mass, and postmenopausal bleeding. Pathology revealed no evidence of atypia or malignancy.  She has a history on an elevated bilirubin (indirect).  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  Symptomatically, she denies any acute concerns. Shortness of breath improved. She has neuropathy in her feet that is not causing difficulties with her ambulation.  Exam reveals stable post-operative changes. LEFT breast is tender to palpation (stable) with palpable scarring.   Plan: 1. Labs today:  CBC with diff, CMP,  CA27.29. 2. Discuss interval mammogram -  mild evolving fat necrosis with dystrophic calcifications in the lower outer aspect of the RIGHT breast. There was no mammographic evidence of malignancy in either breast. 3. Discuss port removal. Patient is not ready to have it removed. Continue port flushes every 6-8 weeks until removal. 4. RTC in 6 months for MD assessment and labs (CBC with diff, CMP, CA27.29).   Honor Loh, NP  09/03/17, 10:55 AM   I saw and evaluated the patient, participating in the key portions of the service and reviewing pertinent diagnostic studies and records.  I reviewed the nurse practitioner's note and agree with the findings and the plan.  The assessment and plan were discussed with the patient.  Several questions were asked by the patient and answered.   Nolon Stalls, MD 09/03/2017,10:55 AM

## 2017-09-03 ENCOUNTER — Encounter: Payer: Self-pay | Admitting: Hematology and Oncology

## 2017-09-03 ENCOUNTER — Inpatient Hospital Stay: Payer: Medicare Other | Attending: Hematology and Oncology

## 2017-09-03 ENCOUNTER — Inpatient Hospital Stay (HOSPITAL_BASED_OUTPATIENT_CLINIC_OR_DEPARTMENT_OTHER): Payer: Medicare Other | Admitting: Hematology and Oncology

## 2017-09-03 VITALS — BP 153/83 | HR 80 | Temp 97.3°F | Resp 18 | Wt 234.1 lb

## 2017-09-03 DIAGNOSIS — C50212 Malignant neoplasm of upper-inner quadrant of left female breast: Secondary | ICD-10-CM

## 2017-09-03 DIAGNOSIS — Z171 Estrogen receptor negative status [ER-]: Principal | ICD-10-CM

## 2017-09-03 DIAGNOSIS — Z923 Personal history of irradiation: Secondary | ICD-10-CM | POA: Insufficient documentation

## 2017-09-03 DIAGNOSIS — G629 Polyneuropathy, unspecified: Secondary | ICD-10-CM | POA: Diagnosis not present

## 2017-09-03 DIAGNOSIS — Z87891 Personal history of nicotine dependence: Secondary | ICD-10-CM

## 2017-09-03 DIAGNOSIS — Z853 Personal history of malignant neoplasm of breast: Secondary | ICD-10-CM | POA: Insufficient documentation

## 2017-09-03 DIAGNOSIS — Z7982 Long term (current) use of aspirin: Secondary | ICD-10-CM | POA: Insufficient documentation

## 2017-09-03 DIAGNOSIS — Z79899 Other long term (current) drug therapy: Secondary | ICD-10-CM | POA: Diagnosis not present

## 2017-09-03 LAB — COMPREHENSIVE METABOLIC PANEL
ALT: 24 U/L (ref 0–44)
AST: 29 U/L (ref 15–41)
Albumin: 4.2 g/dL (ref 3.5–5.0)
Alkaline Phosphatase: 49 U/L (ref 38–126)
Anion gap: 9 (ref 5–15)
BUN: 15 mg/dL (ref 8–23)
CO2: 24 mmol/L (ref 22–32)
Calcium: 9.1 mg/dL (ref 8.9–10.3)
Chloride: 106 mmol/L (ref 98–111)
Creatinine, Ser: 1.02 mg/dL — ABNORMAL HIGH (ref 0.44–1.00)
GFR calc Af Amer: >60 mL/min (ref >60)
GFR calc non Af Amer: 56 mL/min — ABNORMAL LOW (ref >60)
Glucose, Bld: 117 mg/dL — ABNORMAL HIGH (ref 70–99)
Potassium: 4 mmol/L (ref 3.5–5.1)
Sodium: 139 mmol/L (ref 135–145)
Total Bilirubin: 1.2 mg/dL (ref 0.3–1.2)
Total Protein: 7.2 g/dL (ref 6.5–8.1)

## 2017-09-03 LAB — CBC WITH DIFFERENTIAL/PLATELET
Basophils Absolute: 0.1 10*3/uL (ref 0–0.1)
Basophils Relative: 1 %
Eosinophils Absolute: 0.1 10*3/uL (ref 0–0.7)
Eosinophils Relative: 2 %
HCT: 43.2 % (ref 35.0–47.0)
Hemoglobin: 14.9 g/dL (ref 12.0–16.0)
Lymphocytes Relative: 41 %
Lymphs Abs: 2.5 10*3/uL (ref 1.0–3.6)
MCH: 30.5 pg (ref 26.0–34.0)
MCHC: 34.5 g/dL (ref 32.0–36.0)
MCV: 88.5 fL (ref 80.0–100.0)
Monocytes Absolute: 0.4 10*3/uL (ref 0.2–0.9)
Monocytes Relative: 7 %
Neutro Abs: 3.1 10*3/uL (ref 1.4–6.5)
Neutrophils Relative %: 49 %
Platelets: 170 10*3/uL (ref 150–440)
RBC: 4.88 MIL/uL (ref 3.80–5.20)
RDW: 13.4 % (ref 11.5–14.5)
WBC: 6.3 10*3/uL (ref 3.6–11.0)

## 2017-09-03 MED ORDER — SODIUM CHLORIDE 0.9% FLUSH
10.0000 mL | Freq: Once | INTRAVENOUS | Status: AC
  Administered 2017-09-03: 10 mL via INTRAVENOUS
  Filled 2017-09-03: qty 10

## 2017-09-03 MED ORDER — HEPARIN SOD (PORK) LOCK FLUSH 100 UNIT/ML IV SOLN
500.0000 [IU] | Freq: Once | INTRAVENOUS | Status: AC
  Administered 2017-09-03: 500 [IU] via INTRAVENOUS

## 2017-09-03 NOTE — Progress Notes (Signed)
Patient states she sometimes has a little nausea but nothing significant.  Patent states she had a normal mammogram about 3 years ago.

## 2017-09-04 LAB — CA 27.29 (SERIAL MONITOR): CA 27.29: 13.2 U/mL (ref 0.0–38.6)

## 2017-09-22 ENCOUNTER — Other Ambulatory Visit: Payer: Self-pay

## 2017-09-22 ENCOUNTER — Encounter: Payer: Self-pay | Admitting: Emergency Medicine

## 2017-09-22 ENCOUNTER — Emergency Department
Admission: EM | Admit: 2017-09-22 | Discharge: 2017-09-22 | Disposition: A | Discharge status: Home / Self Care | Payer: Medicare Other | Attending: Emergency Medicine | Admitting: Emergency Medicine

## 2017-09-22 DIAGNOSIS — Z923 Personal history of irradiation: Secondary | ICD-10-CM | POA: Diagnosis not present

## 2017-09-22 DIAGNOSIS — Z7902 Long term (current) use of antithrombotics/antiplatelets: Secondary | ICD-10-CM | POA: Diagnosis not present

## 2017-09-22 DIAGNOSIS — R110 Nausea: Secondary | ICD-10-CM

## 2017-09-22 DIAGNOSIS — Z7982 Long term (current) use of aspirin: Secondary | ICD-10-CM | POA: Insufficient documentation

## 2017-09-22 DIAGNOSIS — Z85828 Personal history of other malignant neoplasm of skin: Secondary | ICD-10-CM | POA: Insufficient documentation

## 2017-09-22 DIAGNOSIS — J449 Chronic obstructive pulmonary disease, unspecified: Secondary | ICD-10-CM | POA: Diagnosis not present

## 2017-09-22 DIAGNOSIS — Z87891 Personal history of nicotine dependence: Secondary | ICD-10-CM | POA: Insufficient documentation

## 2017-09-22 DIAGNOSIS — R112 Nausea with vomiting, unspecified: Secondary | ICD-10-CM | POA: Insufficient documentation

## 2017-09-22 DIAGNOSIS — R197 Diarrhea, unspecified: Secondary | ICD-10-CM | POA: Diagnosis not present

## 2017-09-22 DIAGNOSIS — R109 Unspecified abdominal pain: Secondary | ICD-10-CM

## 2017-09-22 DIAGNOSIS — Z79899 Other long term (current) drug therapy: Secondary | ICD-10-CM | POA: Diagnosis not present

## 2017-09-22 DIAGNOSIS — I129 Hypertensive chronic kidney disease with stage 1 through stage 4 chronic kidney disease, or unspecified chronic kidney disease: Secondary | ICD-10-CM | POA: Diagnosis not present

## 2017-09-22 DIAGNOSIS — N183 Chronic kidney disease, stage 3 (moderate): Secondary | ICD-10-CM | POA: Diagnosis not present

## 2017-09-22 DIAGNOSIS — Z853 Personal history of malignant neoplasm of breast: Secondary | ICD-10-CM | POA: Diagnosis not present

## 2017-09-22 DIAGNOSIS — R1030 Lower abdominal pain, unspecified: Secondary | ICD-10-CM | POA: Insufficient documentation

## 2017-09-22 LAB — COMPREHENSIVE METABOLIC PANEL
ALT: 22 U/L (ref 0–44)
AST: 25 U/L (ref 15–41)
Albumin: 4.2 g/dL (ref 3.5–5.0)
Alkaline Phosphatase: 46 U/L (ref 38–126)
Anion gap: 9 (ref 5–15)
BUN: 14 mg/dL (ref 8–23)
CHLORIDE: 108 mmol/L (ref 98–111)
CO2: 25 mmol/L (ref 22–32)
Calcium: 9.4 mg/dL (ref 8.9–10.3)
Creatinine, Ser: 0.91 mg/dL (ref 0.44–1.00)
GFR calc Af Amer: >60 mL/min (ref >60)
Glucose, Bld: 104 mg/dL — ABNORMAL HIGH (ref 70–99)
Potassium: 3.7 mmol/L (ref 3.5–5.1)
SODIUM: 142 mmol/L (ref 135–145)
Total Bilirubin: 1.2 mg/dL (ref 0.3–1.2)
Total Protein: 7.5 g/dL (ref 6.5–8.1)

## 2017-09-22 LAB — LIPASE, BLOOD: LIPASE: 51 U/L (ref 11–51)

## 2017-09-22 LAB — CBC
HCT: 44.5 % (ref 35.0–47.0)
Hemoglobin: 15.6 g/dL (ref 12.0–16.0)
MCH: 31.1 pg (ref 26.0–34.0)
MCHC: 35.2 g/dL (ref 32.0–36.0)
MCV: 88.5 fL (ref 80.0–100.0)
Platelets: 189 10*3/uL (ref 150–440)
RBC: 5.03 MIL/uL (ref 3.80–5.20)
RDW: 13.8 % (ref 11.5–14.5)
WBC: 8.4 10*3/uL (ref 3.6–11.0)

## 2017-09-22 MED ORDER — DICYCLOMINE HCL 20 MG PO TABS: 20.0000 mg | ORAL_TABLET | Freq: Three times a day (TID) | ORAL | 0 refills | Status: DC | PRN

## 2017-09-22 MED ORDER — ONDANSETRON HCL 4 MG PO TABS: 4.0000 mg | ORAL_TABLET | Freq: Three times a day (TID) | ORAL | 0 refills | Status: DC | PRN

## 2017-09-22 MED ORDER — DICYCLOMINE HCL 10 MG PO CAPS
10.0000 mg | ORAL_CAPSULE | Freq: Once | ORAL | Status: AC
  Administered 2017-09-22: 10 mg via ORAL
  Filled 2017-09-22: qty 1

## 2017-09-22 NOTE — ED Triage Notes (Signed)
Pt reports that she has abd pain for the last month and a half with nausea. States that for the last three weeks she has had diarrhea. Pt does have cancer and has a port.

## 2017-09-22 NOTE — Discharge Instructions (Addendum)
Please seek medical attention for any high fevers, chest pain, shortness of breath, change in behavior, persistent vomiting, bloody stool or any other new or concerning symptoms.  

## 2017-09-22 NOTE — ED Provider Notes (Signed)
[facility] Emergency Department Provider Note   ____________________________________________   I have reviewed the triage vital signs and the nursing notes.   HISTORY  Chief Complaint Abdominal Pain; Nausea; and Diarrhea   History limited by: Not Limited   HPI Madison Christensen is a 67 y.o. female who presents to the emergency department today because of concerns for nausea vomiting abdominal discomfort.  The symptoms have been present for the past month and a half.  She stated she called her primary care doctor but was unable to get an appointment until next month.  She came to the emergency department today because she is tired of waiting.  She denies any new symptoms today.  The abdominal pain does somewhat move around however at the time my examination is located in the lower abdomen.  Does sometimes come up to the epigastric region.  She also states that she has had some diarrhea.  Denies any fevers.  Per medical record review patient has a history of HTN, HLD.  Past Medical History:  Diagnosis Date  . Breast cancer ([place]) 2016   left  . Cancer ([place])    skin   . Carcinoma of left breast ([place]) 07/30/2014   1.4 cm, T1c, N0, ER negative, PR negative, HER-2 positive  . Chronic kidney disease    h/o renal insuff  . Complication of anesthesia 2004   oversensitive to noise, lights  . COPD (chronic obstructive pulmonary disease) ([place])   . Depression 1995  . GERD (gastroesophageal reflux disease)   . Hyperlipidemia   . Hypertension   . Personal history of radiation therapy 2016   LEFT lumpectomy    Patient Active Problem List   Diagnosis Date Noted  . Pyelonephritis 02/19/2017  . Elevated bilirubin 07/22/2016  . SOB (shortness of breath) 04/29/2016  . Mild chronic obstructive pulmonary disease ([place]) 01/09/2016  . Chronic cough 11/16/2015  . Chronic diarrhea 11/16/2015  . Essential hypertension 11/16/2015  . Hyperlipidemia, unspecified 11/16/2015   . MDD (major depressive disorder), recurrent episode, moderate ([place]) 11/16/2015  . Stage 3 chronic kidney disease ([place]) 11/16/2015  . Cancer ([place]) 10/02/2014  . Malignant neoplasm of left female breast ([place]) 08/09/2014  . Carcinoma of left breast ([place]) 07/30/2014  . Depression 07/22/2014  . Low potassium syndrome 07/22/2014  . Left breast mass 07/12/2014    Past Surgical History:  Procedure Laterality Date  . ABDOMINAL HYSTERECTOMY    . AXILLARY LYMPH NODE BIOPSY Left 08/03/2014   Procedure: AXILLARY LYMPH NODE BIOPSY;  Surgeon: Robert Bellow, MD;  Location: [facility] ORS;  Service: General;  Laterality: Left;  . BACK SURGERY    . BREAST BIOPSY Left   . 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 BREAST LUMPECTOMY WITH AXILLARY LYMPH NODE DISSECTION Left 08/03/2014   Procedure: BREAST LUMPECTOMY WITH AXILLARY LYMPH NODE DISSECTION;  Surgeon: Robert Bellow, MD;  Location: [facility] ORS;  Service: General;  Laterality: Left;  . BREAST SURGERY Left Aug 03, 2014   Wide excision, sentinel node biopsy.  . CHOLECYSTECTOMY    . COLONOSCOPY  2013   Dr. Vira Agar  . COLONOSCOPY WITH PROPOFOL N/A 05/30/2015   Procedure: COLONOSCOPY WITH PROPOFOL;  Surgeon: Manya Silvas, MD;  Location: [facility] ENDOSCOPY;  Service: Endoscopy;  Laterality: N/A;  . DILATION AND CURETTAGE OF UTERUS N/A 01/16/2017   Procedure: DILATATION AND CURETTAGE;  Surgeon: Ward, Honor Loh, MD;  Location: [facility] ORS;  Service: Gynecology;  Laterality: N/A;  . LAPAROSCOPIC  BILATERAL SALPINGO OOPHERECTOMY Bilateral 02/06/2017   Procedure: LAPAROSCOPIC BILATERAL SALPINGO OOPHORECTOMY;  Surgeon: Ward, Honor Loh, MD;  Location: [facility] ORS;  Service: Gynecology;  Laterality: Bilateral;  . LAPAROSCOPIC HYSTERECTOMY N/A 02/06/2017   Procedure: HYSTERECTOMY TOTAL LAPAROSCOPIC;  Surgeon: Ward, Honor Loh, MD;  Location: [facility] ORS;  Service: Gynecology;  Laterality: N/A;  . PORTACATH PLACEMENT Right 08/31/2014   Procedure: INSERTION  PORT-A-CATH;  Surgeon: Robert Bellow, MD;  Location: [facility] ORS;  Service: General;  Laterality: Right;  . REDUCTION MAMMAPLASTY Bilateral 2016  . SENTINEL NODE BIOPSY Left 08/03/2014   Procedure: SENTINEL NODE BIOPSY;  Surgeon: Robert Bellow, MD;  Location: [facility] ORS;  Service: General;  Laterality: Left;  . skin cancer  removal  2015   nose   . TONSILLECTOMY      Prior to Admission medications   Medication Sig Start Date End Date Taking? Authorizing Provider  acetaminophen-codeine (TYLENOL #3) 300-30 MG tablet Take 1-2 tablets by mouth every 4 (four) hours as needed for moderate pain. 02/06/17   Ward, Honor Loh, MD  albuterol (PROVENTIL HFA;VENTOLIN HFA) 108 (90 Base) MCG/ACT inhaler Inhale 2 puffs into the lungs every 6 (six) hours as needed for wheezing or shortness of breath. 05/01/16   Epifanio Lesches, MD  aspirin EC 81 MG tablet Take 81 mg daily by mouth.     [provider]  cholecalciferol (VITAMIN D) 1000 units tablet Take 1,000 Units by mouth daily.    [provider]  citalopram (CELEXA) 20 MG tablet TAKE 1 TABLET (20 MG TOTAL) BY MOUTH DAILY. Patient taking differently: Take 30 mg daily by mouth.  11/27/15   Lloyd Huger, MD  gabapentin (NEURONTIN) 100 MG capsule TAKE 1 CAPSULE BY MOUTH THREE TIMES DAILY Patient taking differently: Take 200 mg by mouth in the morning and take 100 mg by mouth at bedtime 07/15/16   Lequita Asal, MD  Multiple Vitamin (MULTIVITAMIN) tablet Take 2 tablets daily by mouth.     [provider]  NON FORMULARY Apply 1 application 2 (two) times daily topically. Unscented Free-up Professional Massage Cream Mixed with Engineer, drilling, Historical, MD  ondansetron (ZOFRAN) 4 MG tablet Take 1 tablet (4 mg total) by mouth every 6 (six) hours as needed for nausea. 02/21/17   Lisette Grinder, CNM  Polyethyl Glycol-Propyl Glycol (SYSTANE ULTRA) 0.4-0.3 % SOLN Place 1 drop as needed into both eyes (for dry eyes).     [provider]  pravastatin (PRAVACHOL) 40 MG tablet Take 40 mg at bedtime by mouth.  06/14/14   [provider]  spironolactone (ALDACTONE) 25 MG tablet Take 25 mg by mouth 2 (two) times daily.  01/09/16 09/03/17  [provider]    Allergies Codeine; Losartan; Lovastatin; and Sulfa antibiotics  Family History  Problem Relation Age of Onset  . Breast cancer Mother 61          . Breast cancer Cousin 62            Social History Social History   Tobacco Use  . Smoking status: Former Smoker    Packs/day: 1.00    Years: 2.00    Pack years: 2.00    Types: Cigarettes    Last attempt to quit: 07/30/1976    Years since quitting: 41.1  . Smokeless tobacco: Never Used  Substance Use Topics  . Alcohol use: Yes    Alcohol/week: 0.0 - 1.2 oz  . Drug use: No    Review of Systems  Constitutional: No fever/chills Eyes: No visual changes. ENT: No sore throat. Cardiovascular: Denies chest pain. Respiratory: Denies shortness of breath. Gastrointestinal: Positive abdominal discomfort, nausea vomiting diarrhea Genitourinary: Negative for dysuria. Musculoskeletal: Negative for back pain. Skin: Negative for rash. Neurological: Negative for headaches, focal weakness or numbness.  ____________________________________________   PHYSICAL EXAM:  VITAL SIGNS: ED Triage Vitals  Enc Vitals Group     BP 09/22/17 1813 (!) 157/67     Pulse Rate 09/22/17 1813 94     Resp 09/22/17 1813 20     Temp 09/22/17 1813 98.5 F (36.9 C)     Temp Source 09/22/17 1813 Oral     SpO2 09/22/17 1813 96 %     Weight 09/22/17 1814 234 lb (106.1 kg)     Height 09/22/17 1814 5' 4.5" (1.638 m)     Head Circumference --      Peak Flow --      Pain Score 09/22/17 1813 8   Constitutional: Alert and oriented.  Eyes: Conjunctivae are normal.  ENT      Head: Normocephalic and atraumatic.      Nose: No congestion/rhinnorhea.      Mouth/Throat: Mucous membranes are moist.      Neck:  No stridor. Hematological/Lymphatic/Immunilogical: No cervical lymphadenopathy. Cardiovascular: Normal rate, regular rhythm.  No murmurs, rubs, or gallops. Respiratory: Normal respiratory effort without tachypnea nor retractions. Breath sounds are clear and equal bilaterally. No wheezes/rales/rhonchi. Gastrointestinal: Soft and non tender. No rebound. No guarding.  Genitourinary: Deferred Musculoskeletal: Normal range of motion in all extremities. No lower extremity edema. Neurologic:  Normal speech and language. No gross focal neurologic deficits are appreciated.  Skin:  Skin is warm, dry and intact. No rash noted. Psychiatric: Mood and affect are normal. Speech and behavior are normal. Patient exhibits appropriate insight and judgment.  ____________________________________________    LABS (pertinent positives/negatives)  Lipase 51 CMP wnl except glu 104 CBC wbc 8.4, hgb 15.6, plt 189  ____________________________________________   EKG  None  ____________________________________________    RADIOLOGY  None  ____________________________________________   PROCEDURES  Procedures  ____________________________________________   INITIAL IMPRESSION / ASSESSMENT AND PLAN / ED COURSE  Pertinent labs & imaging results that were available during my care of the patient were reviewed by me and considered in my medical decision making (see chart for details).   Patient presented to the emergency department today because of concerns for abdominal discomfort and nausea.  Has been going on for a month and a half.  Blood work without any concerning findings.  At this point I doubt significant abdominal infection.  Doubt obstruction or volvulus.  Did trial patient on Bentyl.  No significant relief.  I do think it is reasonable for patient to follow-up with primary care.  This point do not feel any emergent imaging is warranted.  ____________________________________________   FINAL  CLINICAL IMPRESSION(S) / ED DIAGNOSES  Final diagnoses:  Nausea  Abdominal pain, unspecified abdominal location     Note: This dictation was prepared with Dragon dictation. Any transcriptional errors that result from this process are unintentional     Nance Pear, MD 09/22/17 2124

## 2017-09-22 NOTE — ED Notes (Signed)
Pt reports she is feeling better at this time and requests to go home, MD Archie Balboa made aware.

## 2017-10-15 ENCOUNTER — Inpatient Hospital Stay: Payer: Medicare Other | Attending: Hematology and Oncology

## 2017-11-26 ENCOUNTER — Inpatient Hospital Stay: Payer: Medicare Other | Attending: Hematology and Oncology

## 2017-11-26 DIAGNOSIS — Z853 Personal history of malignant neoplasm of breast: Secondary | ICD-10-CM | POA: Insufficient documentation

## 2017-11-26 DIAGNOSIS — Z95828 Presence of other vascular implants and grafts: Secondary | ICD-10-CM

## 2017-11-26 DIAGNOSIS — Z452 Encounter for adjustment and management of vascular access device: Secondary | ICD-10-CM | POA: Diagnosis present

## 2017-11-26 MED ORDER — HEPARIN SOD (PORK) LOCK FLUSH 100 UNIT/ML IV SOLN
INTRAVENOUS | Status: AC
  Filled 2017-11-26: qty 5

## 2017-11-26 MED ORDER — HEPARIN SOD (PORK) LOCK FLUSH 100 UNIT/ML IV SOLN
500.0000 [IU] | Freq: Once | INTRAVENOUS | Status: AC
  Administered 2017-11-26: 500 [IU] via INTRAVENOUS

## 2017-11-26 MED ORDER — SODIUM CHLORIDE 0.9% FLUSH
10.0000 mL | Freq: Once | INTRAVENOUS | Status: AC
  Administered 2017-11-26: 10 mL via INTRAVENOUS
  Filled 2017-11-26: qty 10

## 2018-01-14 ENCOUNTER — Inpatient Hospital Stay: Payer: Medicare Other | Attending: Hematology and Oncology

## 2018-01-14 DIAGNOSIS — Z853 Personal history of malignant neoplasm of breast: Secondary | ICD-10-CM | POA: Diagnosis present

## 2018-01-14 DIAGNOSIS — Z452 Encounter for adjustment and management of vascular access device: Secondary | ICD-10-CM | POA: Insufficient documentation

## 2018-01-14 DIAGNOSIS — C50212 Malignant neoplasm of upper-inner quadrant of left female breast: Secondary | ICD-10-CM

## 2018-01-14 DIAGNOSIS — Z95828 Presence of other vascular implants and grafts: Secondary | ICD-10-CM

## 2018-01-14 DIAGNOSIS — Z171 Estrogen receptor negative status [ER-]: Secondary | ICD-10-CM

## 2018-01-14 LAB — CBC WITH DIFFERENTIAL/PLATELET
Abs Immature Granulocytes: 0.01 10*3/uL (ref 0.00–0.07)
Basophils Absolute: 0.1 10*3/uL (ref 0.0–0.1)
Basophils Relative: 1 %
Eosinophils Absolute: 0.1 10*3/uL (ref 0.0–0.5)
Eosinophils Relative: 1 %
HCT: 44 % (ref 36.0–46.0)
Hemoglobin: 14.5 g/dL (ref 12.0–15.0)
Immature Granulocytes: 0 %
Lymphocytes Relative: 35 %
Lymphs Abs: 2.1 10*3/uL (ref 0.7–4.0)
MCH: 29.9 pg (ref 26.0–34.0)
MCHC: 33 g/dL (ref 30.0–36.0)
MCV: 90.7 fL (ref 80.0–100.0)
Monocytes Absolute: 0.4 10*3/uL (ref 0.1–1.0)
Monocytes Relative: 7 %
Neutro Abs: 3.3 10*3/uL (ref 1.7–7.7)
Neutrophils Relative %: 56 %
Platelets: 165 10*3/uL (ref 150–400)
RBC: 4.85 MIL/uL (ref 3.87–5.11)
RDW: 12.5 % (ref 11.5–15.5)
WBC: 6 10*3/uL (ref 4.0–10.5)
nRBC: 0 % (ref 0.0–0.2)

## 2018-01-14 LAB — COMPREHENSIVE METABOLIC PANEL
ALT: 21 U/L (ref 0–44)
AST: 23 U/L (ref 15–41)
Albumin: 4 g/dL (ref 3.5–5.0)
Alkaline Phosphatase: 45 U/L (ref 38–126)
Anion gap: 7 (ref 5–15)
BUN: 14 mg/dL (ref 8–23)
CO2: 27 mmol/L (ref 22–32)
Calcium: 9.4 mg/dL (ref 8.9–10.3)
Chloride: 104 mmol/L (ref 98–111)
Creatinine, Ser: 0.88 mg/dL (ref 0.44–1.00)
GFR calc Af Amer: >60 mL/min (ref >60)
GFR calc non Af Amer: >60 mL/min (ref >60)
Glucose, Bld: 110 mg/dL — ABNORMAL HIGH (ref 70–99)
Potassium: 3.8 mmol/L (ref 3.5–5.1)
Sodium: 138 mmol/L (ref 135–145)
Total Bilirubin: 1.3 mg/dL — ABNORMAL HIGH (ref 0.3–1.2)
Total Protein: 7.2 g/dL (ref 6.5–8.1)

## 2018-01-14 MED ORDER — HEPARIN SOD (PORK) LOCK FLUSH 100 UNIT/ML IV SOLN
500.0000 [IU] | Freq: Once | INTRAVENOUS | Status: AC
  Administered 2018-01-14: 500 [IU] via INTRAVENOUS

## 2018-01-14 MED ORDER — SODIUM CHLORIDE 0.9% FLUSH
10.0000 mL | Freq: Once | INTRAVENOUS | Status: AC
  Administered 2018-01-14: 10 mL via INTRAVENOUS
  Filled 2018-01-14: qty 10

## 2018-01-15 LAB — CANCER ANTIGEN 27.29: CA 27.29: 12 U/mL (ref 0.0–38.6)

## 2018-03-04 ENCOUNTER — Other Ambulatory Visit: Payer: Self-pay | Admitting: *Deleted

## 2018-03-04 DIAGNOSIS — C50212 Malignant neoplasm of upper-inner quadrant of left female breast: Secondary | ICD-10-CM

## 2018-03-04 DIAGNOSIS — Z171 Estrogen receptor negative status [ER-]: Principal | ICD-10-CM

## 2018-03-05 ENCOUNTER — Inpatient Hospital Stay: Payer: Medicare Other | Admitting: Hematology and Oncology

## 2018-03-05 ENCOUNTER — Inpatient Hospital Stay: Payer: Medicare Other

## 2018-03-05 DIAGNOSIS — M8580 Other specified disorders of bone density and structure, unspecified site: Secondary | ICD-10-CM | POA: Insufficient documentation

## 2018-03-05 NOTE — Progress Notes (Deleted)
[facility] day:  03/05/18  Chief Complaint: Madison Christensen is a 67 y.o. female with stage IA Her2/neu + left breast cancer who is seen for 6 month assessment.  HPI:  The patient was last seen in the medical oncology clinic on 08/24/2017.  At that time,  she denied any acute concerns. Shortness of breath had improved. She had neuropathy in her feet that was not causing difficulties with her ambulation.  Exam revealed stable post-operative changes. LEFT breast was tender to palpation (stable) with palpable scarring.   During the interim,   Past Medical History:  Diagnosis Date  . Breast cancer ([place]) 2016   left  . Cancer ([place])    skin   . Carcinoma of left breast ([place]) 07/30/2014   1.4 cm, T1c, N0, ER negative, PR negative, HER-2 positive  . Chronic kidney disease    h/o renal insuff  . Complication of anesthesia 2004   oversensitive to noise, lights  . COPD (chronic obstructive pulmonary disease) ([place])   . Depression 1995  . GERD (gastroesophageal reflux disease)   . Hyperlipidemia   . Hypertension   . Personal history of radiation therapy 2016   LEFT lumpectomy    Past Surgical History:  Procedure Laterality Date  . ABDOMINAL HYSTERECTOMY    . AXILLARY LYMPH NODE BIOPSY Left 08/03/2014   Procedure: AXILLARY LYMPH NODE BIOPSY;  Surgeon: Robert Bellow, MD;  Location: [facility] ORS;  Service: General;  Laterality: Left;  . BACK SURGERY    . BREAST BIOPSY Left   . 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 BREAST LUMPECTOMY WITH AXILLARY LYMPH NODE DISSECTION Left 08/03/2014   Procedure: BREAST LUMPECTOMY WITH AXILLARY LYMPH NODE DISSECTION;  Surgeon: Robert Bellow, MD;  Location: [facility] ORS;  Service: General;  Laterality: Left;  . BREAST SURGERY Left Aug 03, 2014   Wide excision, sentinel node biopsy.  . CHOLECYSTECTOMY    . COLONOSCOPY  2013   Dr. Vira Agar  . COLONOSCOPY WITH PROPOFOL N/A 05/30/2015   Procedure: COLONOSCOPY WITH PROPOFOL;  Surgeon: Manya Silvas, MD;  Location[facility] ENDOSCOPY;  Service: Endoscopy;  Laterality: N/A;  . DILATION AND CURETTAGE OF UTERUS N/A 01/16/2017   Procedure: DILATATION AND CURETTAGE;  Surgeon: Ward, Honor Loh, MD;  Location: [facility] ORS;  Service: Gynecology;  Laterality: N/A;  . LAPAROSCOPIC BILATERAL SALPINGO OOPHERECTOMY Bilateral 02/06/2017   Procedure: LAPAROSCOPIC BILATERAL SALPINGO OOPHORECTOMY;  Surgeon: Ward, Honor Loh, MD;  Location: [facility] ORS;  Service: Gynecology;  Laterality: Bilateral;  . LAPAROSCOPIC HYSTERECTOMY N/A 02/06/2017   Procedure: HYSTERECTOMY TOTAL LAPAROSCOPIC;  Surgeon: Ward, Honor Loh, MD;  Location: [facility] ORS;  Service: Gynecology;  Laterality: N/A;  . PORTACATH PLACEMENT Right 08/31/2014   Procedure: INSERTION PORT-A-CATH;  Surgeon: Robert Bellow, MD;  Location: [facility] ORS;  Service: General;  Laterality: Right;  . REDUCTION MAMMAPLASTY Bilateral 2016  . SENTINEL NODE BIOPSY Left 08/03/2014   Procedure: SENTINEL NODE BIOPSY;  Surgeon: Robert Bellow, MD;  Location: [facility] ORS;  Service: General;  Laterality: Left;  . skin cancer  removal  2015   nose   . TONSILLECTOMY      Family History  Problem Relation Age of Onset  . Breast cancer Mother 69          . Breast cancer Cousin 62            Social History:  reports that she quit smoking about 41 years ago.  Her smoking use included cigarettes. She has a 2.00 pack-year smoking history. She has never used smokeless tobacco. She reports current alcohol use. She reports that she does not use drugs.  She is retired from [employer].  She lives in [place].  Her husband died 4 years ago.  Her sister's name is Arville Go.  The patient is alone today.  Allergies:  Allergies  Allergen Reactions  . Codeine Other (See Comments)    Severe constipation  . Losartan Other (See Comments)    Sick on stomach  . Lovastatin Other (See Comments)    Per pt headache  . Sulfa Antibiotics  Hives and Rash    Current Medications: Current Outpatient Medications  Medication Sig Dispense Refill  . acetaminophen-codeine (TYLENOL #3) 300-30 MG tablet Take 1-2 tablets by mouth every 4 (four) hours as needed for moderate pain. 21 tablet 0  . albuterol (PROVENTIL HFA;VENTOLIN HFA) 108 (90 Base) MCG/ACT inhaler Inhale 2 puffs into the lungs every 6 (six) hours as needed for wheezing or shortness of breath. 1 Inhaler 2  . aspirin EC 81 MG tablet Take 81 mg daily by mouth.     . cholecalciferol (VITAMIN D) 1000 units tablet Take 1,000 Units by mouth daily.    . citalopram (CELEXA) 20 MG tablet TAKE 1 TABLET (20 MG TOTAL) BY MOUTH DAILY. (Patient taking differently: Take 30 mg daily by mouth. ) 30 tablet 5  . dicyclomine (BENTYL) 20 MG tablet Take 1 tablet (20 mg total) by mouth 3 (three) times daily as needed (abdominal pain). 30 tablet 0  . gabapentin (NEURONTIN) 100 MG capsule TAKE 1 CAPSULE BY MOUTH THREE TIMES DAILY (Patient taking differently: Take 200 mg by mouth in the morning and take 100 mg by mouth at bedtime) 90 capsule 1  . Multiple Vitamin (MULTIVITAMIN) tablet Take 2 tablets daily by mouth.     . NON FORMULARY Apply 1 application 2 (two) times daily topically. Unscented Free-up Professional Massage Cream Mixed with Engineer, technical sales    . ondansetron (ZOFRAN) 4 MG tablet Take 1 tablet (4 mg total) by mouth every 6 (six) hours as needed for nausea. 10 tablet 0  . ondansetron (ZOFRAN) 4 MG tablet Take 1 tablet (4 mg total) by mouth every 8 (eight) hours as needed for nausea or vomiting. 20 tablet 0  . Polyethyl Glycol-Propyl Glycol (SYSTANE ULTRA) 0.4-0.3 % SOLN Place 1 drop as needed into both eyes (for dry eyes).    . pravastatin (PRAVACHOL) 40 MG tablet Take 40 mg at bedtime by mouth.   3  . spironolactone (ALDACTONE) 25 MG tablet Take 25 mg by mouth 2 (two) times daily.      No current facility-administered medications for this visit.    Facility-Administered Medications Ordered in  Other Visits  Medication Dose Route Frequency Provider Last Rate Last Dose  . 0.9 %  sodium chloride infusion   Intravenous Continuous Manya Silvas, MD      . diphenhydrAMINE (BENADRYL) capsule 25 mg  25 mg Oral Once Choksi, Delorise Shiner, MD      . sodium chloride 0.9 % injection 10 mL  10 mL Intravenous PRN Forest Gleason, MD   10 mL at 09/25/14 1418  . sodium chloride 0.9 % injection 10 mL  10 mL Intravenous PRN Forest Gleason, MD   10 mL at 01/15/15 1325  . sodium chloride flush (NS) 0.9 % injection 10 mL  10 mL Intravenous PRN Nolon Stalls C, MD   10 mL at 07/22/16 1011  Review of Systems  Constitutional: Negative for diaphoresis, fever, malaise/fatigue and weight loss.       "I feel good. I am trying to be more active".   HENT: Negative.   Eyes: Negative.   Respiratory: Positive for shortness of breath (exertional). Negative for cough, hemoptysis and sputum production.   Cardiovascular: Negative for chest pain, palpitations, orthopnea, leg swelling and PND.  Gastrointestinal: Negative for abdominal pain, blood in stool, constipation, diarrhea, melena, nausea and vomiting.       Last colonoscopy 05/30/2015  Genitourinary: Negative for dysuria, frequency, hematuria and urgency.  Musculoskeletal: Negative for back pain, falls, joint pain and myalgias.  Skin: Negative for itching and rash.  Neurological: Positive for tingling (intermittent neuropathy in toes >> fingers). Negative for dizziness, tremors, weakness and headaches.  Endo/Heme/Allergies: Does not bruise/bleed easily.  Psychiatric/Behavioral: Negative for depression, memory loss and suicidal ideas. The patient is not nervous/anxious and does not have insomnia.   All other systems reviewed and are negative.  Performance status (ECOG): 1 - Symptomatic but completely ambulatory  Physical Exam: There were no vitals taken for this visit. GENERAL:  Well developed, well nourished, woman sitting comfortably in the exam room in no  acute distress. MENTAL STATUS:  Alert and oriented to person, place and time. HEAD:  White/blonde hair.  Normocephalic, atraumatic, face symmetric, no Cushingoid features. EYES:  Glasses.  Blue eyes.  Pupils equal round and reactive to light and accomodation.  No conjunctivitis or scleral icterus. ENT:  Oropharynx clear without lesion.  Tongue normal. Mucous membranes moist.  RESPIRATORY:  Clear to auscultation without rales, wheezes or rhonchi. CARDIOVASCULAR:  Regular rate and rhythm without murmur, rub or gallop. BREAST:  Right breast without masses, skin changes or nipple discharge.  Slight inferior right breast edema.  Left breast tender with moderate post-operative and post-radiation changes 11 - 12 o'clock.  No masses, skin changes or nipple discharge. ABDOMEN:  Soft, non-tender, with active bowel sounds, and no hepatosplenomegaly.  No masses. SKIN:  Keloid right upper chest.  No rashes, ulcers or lesions. EXTREMITIES: No edema, no skin discoloration or tenderness.  No palpable cords. LYMPH NODES: No palpable cervical, supraclavicular, axillary or inguinal adenopathy  NEUROLOGICAL: Unremarkable. PSYCH:  Appropriate.    No visits with results within 3 Day(s) from this visit.  Latest known visit with results is:  Infusion on 01/14/2018  Component Date Value Ref Range Status  . CA 27.29 01/14/2018 12.0  0.0 - 38.6 U/mL Final   Comment: (NOTE) Siemens Centaur Immunochemiluminometric Methodology [facility]) Values obtained with different assay methods or kits cannot be used interchangeably. Results cannot be interpreted as absolute evidence of the presence or absence of malignant disease. Performed At: [facility] [place], [place] [id number] Rush Farmer MD [id number]   . Sodium 01/14/2018 138  135 - 145 mmol/L Final  . Potassium 01/14/2018 3.8  3.5 - 5.1 mmol/L Final  . Chloride 01/14/2018 104  98 - 111 mmol/L Final  . CO2 01/14/2018 27  22 - 32 mmol/L  Final  . Glucose, Bld 01/14/2018 110* 70 - 99 mg/dL Final  . BUN 01/14/2018 14  8 - 23 mg/dL Final  . Creatinine, Ser 01/14/2018 0.88  0.44 - 1.00 mg/dL Final  . Calcium 01/14/2018 9.4  8.9 - 10.3 mg/dL Final  . Total Protein 01/14/2018 7.2  6.5 - 8.1 g/dL Final  . Albumin 01/14/2018 4.0  3.5 - 5.0 g/dL Final  . AST 01/14/2018 23  15 - 41 U/L Final  .  ALT 01/14/2018 21  0 - 44 U/L Final  . Alkaline Phosphatase 01/14/2018 45  38 - 126 U/L Final  . Total Bilirubin 01/14/2018 1.3* 0.3 - 1.2 mg/dL Final  . GFR calc non Af Amer 01/14/2018 >60  >60 mL/min Final  . GFR calc Af Amer 01/14/2018 >60  >60 mL/min Final   Comment: (NOTE) The eGFR has been calculated using the CKD EPI equation. This calculation has not been validated in all clinical situations. eGFR's persistently <60 mL/min signify possible Chronic Kidney Disease.   Georgiann Hahn gap 01/14/2018 7  5 - 15 Final   Performed at [facility], [address] [place], [place] [address]  . WBC 01/14/2018 6.0  4.0 - 10.5 K/uL Final  . RBC 01/14/2018 4.85  3.87 - 5.11 MIL/uL Final  . Hemoglobin 01/14/2018 14.5  12.0 - 15.0 g/dL Final  . HCT 01/14/2018 44.0  36.0 - 46.0 % Final  . MCV 01/14/2018 90.7  80.0 - 100.0 fL Final  . MCH 01/14/2018 29.9  26.0 - 34.0 pg Final  . MCHC 01/14/2018 33.0  30.0 - 36.0 g/dL Final  . RDW 01/14/2018 12.5  11.5 - 15.5 % Final  . Platelets 01/14/2018 165  150 - 400 K/uL Final  . nRBC 01/14/2018 0.0  0.0 - 0.2 % Final  . Neutrophils Relative % 01/14/2018 56  % Final  . Neutro Abs 01/14/2018 3.3  1.7 - 7.7 K/uL Final  . Lymphocytes Relative 01/14/2018 35  % Final  . Lymphs Abs 01/14/2018 2.1  0.7 - 4.0 K/uL Final  . Monocytes Relative 01/14/2018 7  % Final  . Monocytes Absolute 01/14/2018 0.4  0.1 - 1.0 K/uL Final  . Eosinophils Relative 01/14/2018 1  % Final  . Eosinophils Absolute 01/14/2018 0.1  0.0 - 0.5 K/uL Final  . Basophils Relative 01/14/2018 1  % Final  . Basophils Absolute 01/14/2018 0.1   0.0 - 0.1 K/uL Final  . Immature Granulocytes 01/14/2018 0  % Final  . Abs Immature Granulocytes 01/14/2018 0.01  0.00 - 0.07 K/uL Final   Performed at [facility], [address] [place], [place] [address]    Assessment:  Madison Christensen is a 67 y.o. female with stage IA Her2/neu + left breast cancer status wide excision on  08/03/2014.  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  Lymphvascular invasion was indeterminate (+ on original biopsy).  DCIS was present.  Three sentinel lymph nodes were negative.  Tumor was ER -, PR -, and Her2/neu 3+ by IHC.  Pathologic stage was [id number].  She received adjuvant Taxol and Herceptin.  She received 10 of 12 weeks of Taxol (09/04/2014 - 11/06/2014).  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  She received Herceptin x 6 months (09/04/2014 - 03/27/2015).  Herceptin was discontinued secondary to myalgias.  She completed radiation therapy in 02/2015.  Bilateral mammogram on 07/18/2015 revealed no evidence of malignancy.  Bilateral mammogram on 07/22/2016 was benign.  There were scattered areas of fibroglandular density.  Post surgical changes were noted in the left breast.  Diagnostic right mammogram and ultrasound on 12/01/2016 revealed normal appearing breast tissue and postreduction scar in the area of clinical concern in the lower outer right breast.  No mass was seen. Bilateral mammogram on 08/19/2017 revealed mild evolving fat necrosis with dystrophic calcifications in the lower outer aspect of the RIGHT breast. There was no mammographic evidence of malignancy in either breast.  CA27.29 has been followed:  16.3 on 03/11/2016, 9.8 on 07/22/2016, 18.6 on 11/24/2016, and 15.5 on 03/26/2017.  Bone density study on 05/12/2016 revealed osteopenia with a T score of -1.1 in the right femur and -0.4 in the AP spine L1-L2.  She underwent total laparoscopic hysterectomy and bilateral oophorectomy on 02/06/2017 for  pelvic pain, adnexal mass, and postmenopausal bleeding. Pathology revealed no evidence of atypia or malignancy.  She has a history on an elevated bilirubin (indirect).  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  Symptomatically,  she denies any acute concerns. Shortness of breath improved. She has neuropathy in her feet that is not causing difficulties with her ambulation.  Exam reveals stable post-operative changes. LEFT breast is tender to palpation (stable) with palpable scarring.   Plan: 1. Labs today:  CBC with diff, CMP, CA27.29. 2.   Stage IA Her2/neu + left breast cancer:  Bilateral mammogram on 08/20/2018. 3.   Osteopenia:  Bone density study on 05/13/2018.  2. Discuss interval mammogram -  mild evolving fat necrosis with dystrophic calcifications in the lower outer aspect of the RIGHT breast. There was no mammographic evidence of malignancy in either breast. 3. Discuss port removal. Patient is not ready to have it removed. Continue port flushes every 6-8 weeks until removal. 4. RTC in 6 months for MD assessment and labs (CBC with diff, CMP, CA27.29).   Lequita Asal, MD  03/05/18, 5:24 AM   I saw and evaluated the patient, participating in the key portions of the service and reviewing pertinent diagnostic studies and records.  I reviewed the nurse practitioner's note and agree with the findings and the plan.  The assessment and plan were discussed with the patient.  Several questions were asked by the patient and answered.   Nolon Stalls, MD 03/05/2018,5:24 AM

## 2018-03-18 ENCOUNTER — Inpatient Hospital Stay: Payer: Medicare Other | Admitting: Hematology and Oncology

## 2018-03-18 ENCOUNTER — Other Ambulatory Visit: Payer: Medicare Other

## 2018-03-18 NOTE — Progress Notes (Deleted)
[facility] day:  03/18/18  Chief Complaint: Madison Christensen is a 67 y.o. female with stage IA Her2/neu + left breast cancer who is seen for 6 month assessment.  HPI:  The patient was last seen in the medical oncology clinic on 08/24/2017.  At that time,  she denied any acute concerns. Shortness of breath had improved. She had neuropathy in her feet that was not causing difficulties with her ambulation.  Exam revealed stable post-operative changes. LEFT breast was tender to palpation (stable) with palpable scarring.   During the interim,   Past Medical History:  Diagnosis Date  . Breast cancer ([place]) 2016   left  . Cancer ([place])    skin   . Carcinoma of left breast ([place]) 07/30/2014   1.4 cm, T1c, N0, ER negative, PR negative, HER-2 positive  . Chronic kidney disease    h/o renal insuff  . Complication of anesthesia 2004   oversensitive to noise, lights  . COPD (chronic obstructive pulmonary disease) ([place])   . Depression 1995  . GERD (gastroesophageal reflux disease)   . Hyperlipidemia   . Hypertension   . Personal history of radiation therapy 2016   LEFT lumpectomy    Past Surgical History:  Procedure Laterality Date  . ABDOMINAL HYSTERECTOMY    . AXILLARY LYMPH NODE BIOPSY Left 08/03/2014   Procedure: AXILLARY LYMPH NODE BIOPSY;  Surgeon: Robert Bellow, MD;  Location: [facility] ORS;  Service: General;  Laterality: Left;  . BACK SURGERY    . BREAST BIOPSY Left   . 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 BREAST LUMPECTOMY WITH AXILLARY LYMPH NODE DISSECTION Left 08/03/2014   Procedure: BREAST LUMPECTOMY WITH AXILLARY LYMPH NODE DISSECTION;  Surgeon: Robert Bellow, MD;  Location: [facility] ORS;  Service: General;  Laterality: Left;  . BREAST SURGERY Left Aug 03, 2014   Wide excision, sentinel node biopsy.  . CHOLECYSTECTOMY    . COLONOSCOPY  2013   Dr. Vira Agar  . COLONOSCOPY WITH PROPOFOL N/A 05/30/2015   Procedure: COLONOSCOPY WITH PROPOFOL;  Surgeon: Manya Silvas, MD;  Location: [facility] ENDOSCOPY;  Service: Endoscopy;  Laterality: N/A;  . DILATION AND CURETTAGE OF UTERUS N/A 01/16/2017   Procedure: DILATATION AND CURETTAGE;  Surgeon: Ward, Honor Loh, MD;  Location: [facility] ORS;  Service: Gynecology;  Laterality: N/A;  . LAPAROSCOPIC BILATERAL SALPINGO OOPHERECTOMY Bilateral 02/06/2017   Procedure: LAPAROSCOPIC BILATERAL SALPINGO OOPHORECTOMY;  Surgeon: Ward, Honor Loh, MD;  Location: [facility] ORS;  Service: Gynecology;  Laterality: Bilateral;  . LAPAROSCOPIC HYSTERECTOMY N/A 02/06/2017   Procedure: HYSTERECTOMY TOTAL LAPAROSCOPIC;  Surgeon: Ward, Honor Loh, MD;  Location: [facility] ORS;  Service: Gynecology;  Laterality: N/A;  . PORTACATH PLACEMENT Right 08/31/2014   Procedure: INSERTION PORT-A-CATH;  Surgeon: Robert Bellow, MD;  Location: [facility] ORS;  Service: General;  Laterality: Right;  . REDUCTION MAMMAPLASTY Bilateral 2016  . SENTINEL NODE BIOPSY Left 08/03/2014   Procedure: SENTINEL NODE BIOPSY;  Surgeon: Robert Bellow, MD;  Location: [facility] ORS;  Service: General;  Laterality: Left;  . skin cancer  removal  2015   nose   . TONSILLECTOMY      Family History  Problem Relation Age of Onset  . Breast cancer Mother 25          . Breast cancer Cousin 62            Social History:  reports that she quit smoking about 41 years ago.  Her smoking use included cigarettes. She has a 2.00 pack-year smoking history. She has never used smokeless tobacco. She reports current alcohol use. She reports that she does not use drugs.  She is retired from [employer].  She lives in [place].  Her husband died 4 years ago.  Her sister's name is Arville Go.  The patient is alone today.  Allergies:  Allergies  Allergen Reactions  . Codeine Other (See Comments)    Severe constipation  . Losartan Other (See Comments)    Sick on stomach  . Lovastatin Other (See Comments)    Per pt headache  . Sulfa Antibiotics  Hives and Rash    Current Medications: Current Outpatient Medications  Medication Sig Dispense Refill  . acetaminophen-codeine (TYLENOL #3) 300-30 MG tablet Take 1-2 tablets by mouth every 4 (four) hours as needed for moderate pain. 21 tablet 0  . albuterol (PROVENTIL HFA;VENTOLIN HFA) 108 (90 Base) MCG/ACT inhaler Inhale 2 puffs into the lungs every 6 (six) hours as needed for wheezing or shortness of breath. 1 Inhaler 2  . aspirin EC 81 MG tablet Take 81 mg daily by mouth.     . cholecalciferol (VITAMIN D) 1000 units tablet Take 1,000 Units by mouth daily.    . citalopram (CELEXA) 20 MG tablet TAKE 1 TABLET (20 MG TOTAL) BY MOUTH DAILY. (Patient taking differently: Take 30 mg daily by mouth. ) 30 tablet 5  . dicyclomine (BENTYL) 20 MG tablet Take 1 tablet (20 mg total) by mouth 3 (three) times daily as needed (abdominal pain). 30 tablet 0  . gabapentin (NEURONTIN) 100 MG capsule TAKE 1 CAPSULE BY MOUTH THREE TIMES DAILY (Patient taking differently: Take 200 mg by mouth in the morning and take 100 mg by mouth at bedtime) 90 capsule 1  . Multiple Vitamin (MULTIVITAMIN) tablet Take 2 tablets daily by mouth.     . NON FORMULARY Apply 1 application 2 (two) times daily topically. Unscented Free-up Professional Massage Cream Mixed with Engineer, technical sales    . ondansetron (ZOFRAN) 4 MG tablet Take 1 tablet (4 mg total) by mouth every 6 (six) hours as needed for nausea. 10 tablet 0  . ondansetron (ZOFRAN) 4 MG tablet Take 1 tablet (4 mg total) by mouth every 8 (eight) hours as needed for nausea or vomiting. 20 tablet 0  . Polyethyl Glycol-Propyl Glycol (SYSTANE ULTRA) 0.4-0.3 % SOLN Place 1 drop as needed into both eyes (for dry eyes).    . pravastatin (PRAVACHOL) 40 MG tablet Take 40 mg at bedtime by mouth.   3  . spironolactone (ALDACTONE) 25 MG tablet Take 25 mg by mouth 2 (two) times daily.      No current facility-administered medications for this visit.    Facility-Administered Medications Ordered in  Other Visits  Medication Dose Route Frequency Provider Last Rate Last Dose  . 0.9 %  sodium chloride infusion   Intravenous Continuous Manya Silvas, MD      . diphenhydrAMINE (BENADRYL) capsule 25 mg  25 mg Oral Once Choksi, Delorise Shiner, MD      . sodium chloride 0.9 % injection 10 mL  10 mL Intravenous PRN Forest Gleason, MD   10 mL at 09/25/14 1418  . sodium chloride 0.9 % injection 10 mL  10 mL Intravenous PRN Forest Gleason, MD   10 mL at 01/15/15 1325  . sodium chloride flush (NS) 0.9 % injection 10 mL  10 mL Intravenous PRN Nolon Stalls C, MD   10 mL at 07/22/16 1011  Review of Systems  Constitutional: Negative for diaphoresis, fever, malaise/fatigue and weight loss.       "I feel good. I am trying to be more active".   HENT: Negative.   Eyes: Negative.   Respiratory: Positive for shortness of breath (exertional). Negative for cough, hemoptysis and sputum production.   Cardiovascular: Negative for chest pain, palpitations, orthopnea, leg swelling and PND.  Gastrointestinal: Negative for abdominal pain, blood in stool, constipation, diarrhea, melena, nausea and vomiting.       Last colonoscopy 05/30/2015  Genitourinary: Negative for dysuria, frequency, hematuria and urgency.  Musculoskeletal: Negative for back pain, falls, joint pain and myalgias.  Skin: Negative for itching and rash.  Neurological: Positive for tingling (intermittent neuropathy in toes >> fingers). Negative for dizziness, tremors, weakness and headaches.  Endo/Heme/Allergies: Does not bruise/bleed easily.  Psychiatric/Behavioral: Negative for depression, memory loss and suicidal ideas. The patient is not nervous/anxious and does not have insomnia.   All other systems reviewed and are negative.  Performance status (ECOG): 1 - Symptomatic but completely ambulatory  Physical Exam: There were no vitals taken for this visit. GENERAL:  Well developed, well nourished, woman sitting comfortably in the exam room in no  acute distress. MENTAL STATUS:  Alert and oriented to person, place and time. HEAD:  White/blonde hair.  Normocephalic, atraumatic, face symmetric, no Cushingoid features. EYES:  Glasses.  Blue eyes.  Pupils equal round and reactive to light and accomodation.  No conjunctivitis or scleral icterus. ENT:  Oropharynx clear without lesion.  Tongue normal. Mucous membranes moist.  RESPIRATORY:  Clear to auscultation without rales, wheezes or rhonchi. CARDIOVASCULAR:  Regular rate and rhythm without murmur, rub or gallop. BREAST:  Right breast without masses, skin changes or nipple discharge.  Slight inferior right breast edema.  Left breast tender with moderate post-operative and post-radiation changes 11 - 12 o'clock.  No masses, skin changes or nipple discharge. ABDOMEN:  Soft, non-tender, with active bowel sounds, and no hepatosplenomegaly.  No masses. SKIN:  Keloid right upper chest.  No rashes, ulcers or lesions. EXTREMITIES: No edema, no skin discoloration or tenderness.  No palpable cords. LYMPH NODES: No palpable cervical, supraclavicular, axillary or inguinal adenopathy  NEUROLOGICAL: Unremarkable. PSYCH:  Appropriate.    No visits with results within 3 Day(s) from this visit.  Latest known visit with results is:  Infusion on 01/14/2018  Component Date Value Ref Range Status  . CA 27.29 01/14/2018 12.0  0.0 - 38.6 U/mL Final   Comment: (NOTE) Siemens Centaur Immunochemiluminometric Methodology [facility]) Values obtained with different assay methods or kits cannot be used interchangeably. Results cannot be interpreted as absolute evidence of the presence or absence of malignant disease. Performed At: [facility] [place], [place] [id number] Rush Farmer MD [id number]   . Sodium 01/14/2018 138  135 - 145 mmol/L Final  . Potassium 01/14/2018 3.8  3.5 - 5.1 mmol/L Final  . Chloride 01/14/2018 104  98 - 111 mmol/L Final  . CO2 01/14/2018 27  22 - 32 mmol/L  Final  . Glucose, Bld 01/14/2018 110* 70 - 99 mg/dL Final  . BUN 01/14/2018 14  8 - 23 mg/dL Final  . Creatinine, Ser 01/14/2018 0.88  0.44 - 1.00 mg/dL Final  . Calcium 01/14/2018 9.4  8.9 - 10.3 mg/dL Final  . Total Protein 01/14/2018 7.2  6.5 - 8.1 g/dL Final  . Albumin 01/14/2018 4.0  3.5 - 5.0 g/dL Final  . AST 01/14/2018 23  15 - 41 U/L Final  .  ALT 01/14/2018 21  0 - 44 U/L Final  . Alkaline Phosphatase 01/14/2018 45  38 - 126 U/L Final  . Total Bilirubin 01/14/2018 1.3* 0.3 - 1.2 mg/dL Final  . GFR calc non Af Amer 01/14/2018 >60  >60 mL/min Final  . GFR calc Af Amer 01/14/2018 >60  >60 mL/min Final   Comment: (NOTE) The eGFR has been calculated using the CKD EPI equation. This calculation has not been validated in all clinical situations. eGFR's persistently <60 mL/min signify possible Chronic Kidney Disease.   Georgiann Hahn gap 01/14/2018 7  5 - 15 Final   Performed at [facility], [address] [place], [place] [address]  . WBC 01/14/2018 6.0  4.0 - 10.5 K/uL Final  . RBC 01/14/2018 4.85  3.87 - 5.11 MIL/uL Final  . Hemoglobin 01/14/2018 14.5  12.0 - 15.0 g/dL Final  . HCT 01/14/2018 44.0  36.0 - 46.0 % Final  . MCV 01/14/2018 90.7  80.0 - 100.0 fL Final  . MCH 01/14/2018 29.9  26.0 - 34.0 pg Final  . MCHC 01/14/2018 33.0  30.0 - 36.0 g/dL Final  . RDW 01/14/2018 12.5  11.5 - 15.5 % Final  . Platelets 01/14/2018 165  150 - 400 K/uL Final  . nRBC 01/14/2018 0.0  0.0 - 0.2 % Final  . Neutrophils Relative % 01/14/2018 56  % Final  . Neutro Abs 01/14/2018 3.3  1.7 - 7.7 K/uL Final  . Lymphocytes Relative 01/14/2018 35  % Final  . Lymphs Abs 01/14/2018 2.1  0.7 - 4.0 K/uL Final  . Monocytes Relative 01/14/2018 7  % Final  . Monocytes Absolute 01/14/2018 0.4  0.1 - 1.0 K/uL Final  . Eosinophils Relative 01/14/2018 1  % Final  . Eosinophils Absolute 01/14/2018 0.1  0.0 - 0.5 K/uL Final  . Basophils Relative 01/14/2018 1  % Final  . Basophils Absolute 01/14/2018 0.1   0.0 - 0.1 K/uL Final  . Immature Granulocytes 01/14/2018 0  % Final  . Abs Immature Granulocytes 01/14/2018 0.01  0.00 - 0.07 K/uL Final   Performed at [facility], [address] [place], [place] [address]    Assessment:  Madison Christensen is a 68 y.o. female with stage IA Her2/neu + left breast cancer status wide excision on  08/03/2014.  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  Lymphvascular invasion was indeterminate (+ on original biopsy).  DCIS was present.  Three sentinel lymph nodes were negative.  Tumor was ER -, PR -, and Her2/neu 3+ by IHC.  Pathologic stage was [id number].  She received adjuvant Taxol and Herceptin.  She received 10 of 12 weeks of Taxol (09/04/2014 - 11/06/2014).  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  She received Herceptin x 6 months (09/04/2014 - 03/27/2015).  Herceptin was discontinued secondary to myalgias.  She completed radiation therapy in 02/2015.  Bilateral mammogram on 07/18/2015 revealed no evidence of malignancy.  Bilateral mammogram on 07/22/2016 was benign.  There were scattered areas of fibroglandular density.  Post surgical changes were noted in the left breast.  Diagnostic right mammogram and ultrasound on 12/01/2016 revealed normal appearing breast tissue and postreduction scar in the area of clinical concern in the lower outer right breast.  No mass was seen. Bilateral mammogram on 08/19/2017 revealed mild evolving fat necrosis with dystrophic calcifications in the lower outer aspect of the RIGHT breast. There was no mammographic evidence of malignancy in either breast.  CA27.29 has been followed:  16.3 on 03/11/2016, 9.8 on 07/22/2016, 18.6 on 11/24/2016, and 15.5 on 03/26/2017.  Bone density study on 05/12/2016 revealed osteopenia with a T score of -1.1 in the right femur and -0.4 in the AP spine L1-L2.  She underwent total laparoscopic hysterectomy and bilateral oophorectomy on 02/06/2017 for  pelvic pain, adnexal mass, and postmenopausal bleeding. Pathology revealed no evidence of atypia or malignancy.  She has a history on an elevated bilirubin (indirect).  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  Symptomatically,  she denies any acute concerns. Shortness of breath improved. She has neuropathy in her feet that is not causing difficulties with her ambulation.  Exam reveals stable post-operative changes. LEFT breast is tender to palpation (stable) with palpable scarring.   Plan: 1. Labs today:  CBC with diff, CMP, CA27.29. 2.   Stage IA Her2/neu + left breast cancer:  Bilateral mammogram on 08/20/2018. 3.   Osteopenia:  Bone density study on 05/13/2018.  2. Discuss interval mammogram -  mild evolving fat necrosis with dystrophic calcifications in the lower outer aspect of the RIGHT breast. There was no mammographic evidence of malignancy in either breast. 3. Discuss port removal. Patient is not ready to have it removed. Continue port flushes every 6-8 weeks until removal. 4. RTC in 6 months for MD assessment and labs (CBC with diff, CMP, CA27.29).   Lequita Asal, MD  03/18/18, 4:46 AM   I saw and evaluated the patient, participating in the key portions of the service and reviewing pertinent diagnostic studies and records.  I reviewed the nurse practitioner's note and agree with the findings and the plan.  The assessment and plan were discussed with the patient.  Several questions were asked by the patient and answered.   Nolon Stalls, MD 03/18/2018,4:46 AM

## 2018-03-25 ENCOUNTER — Encounter: Payer: Self-pay | Admitting: Hematology and Oncology

## 2018-03-25 ENCOUNTER — Other Ambulatory Visit: Payer: Medicare Other

## 2018-03-25 ENCOUNTER — Inpatient Hospital Stay: Payer: Medicare Other | Attending: Hematology and Oncology

## 2018-03-25 ENCOUNTER — Inpatient Hospital Stay: Payer: Medicare Other

## 2018-03-25 ENCOUNTER — Ambulatory Visit: Payer: Medicare Other | Admitting: Hematology and Oncology

## 2018-03-25 ENCOUNTER — Inpatient Hospital Stay (HOSPITAL_BASED_OUTPATIENT_CLINIC_OR_DEPARTMENT_OTHER): Payer: Medicare Other | Admitting: Hematology and Oncology

## 2018-03-25 VITALS — BP 143/86 | HR 69 | Temp 97.1°F | Resp 18 | Wt 235.3 lb

## 2018-03-25 DIAGNOSIS — C50912 Malignant neoplasm of unspecified site of left female breast: Secondary | ICD-10-CM

## 2018-03-25 DIAGNOSIS — Z87891 Personal history of nicotine dependence: Secondary | ICD-10-CM | POA: Diagnosis not present

## 2018-03-25 DIAGNOSIS — Z90710 Acquired absence of both cervix and uterus: Secondary | ICD-10-CM | POA: Insufficient documentation

## 2018-03-25 DIAGNOSIS — R17 Unspecified jaundice: Secondary | ICD-10-CM

## 2018-03-25 DIAGNOSIS — N189 Chronic kidney disease, unspecified: Secondary | ICD-10-CM | POA: Diagnosis not present

## 2018-03-25 DIAGNOSIS — Z923 Personal history of irradiation: Secondary | ICD-10-CM | POA: Diagnosis not present

## 2018-03-25 DIAGNOSIS — Z171 Estrogen receptor negative status [ER-]: Secondary | ICD-10-CM | POA: Diagnosis not present

## 2018-03-25 DIAGNOSIS — Z90722 Acquired absence of ovaries, bilateral: Secondary | ICD-10-CM | POA: Diagnosis not present

## 2018-03-25 DIAGNOSIS — K219 Gastro-esophageal reflux disease without esophagitis: Secondary | ICD-10-CM | POA: Diagnosis not present

## 2018-03-25 DIAGNOSIS — I129 Hypertensive chronic kidney disease with stage 1 through stage 4 chronic kidney disease, or unspecified chronic kidney disease: Secondary | ICD-10-CM | POA: Insufficient documentation

## 2018-03-25 DIAGNOSIS — M8580 Other specified disorders of bone density and structure, unspecified site: Secondary | ICD-10-CM

## 2018-03-25 DIAGNOSIS — Z9221 Personal history of antineoplastic chemotherapy: Secondary | ICD-10-CM | POA: Diagnosis not present

## 2018-03-25 DIAGNOSIS — M85851 Other specified disorders of bone density and structure, right thigh: Secondary | ICD-10-CM

## 2018-03-25 DIAGNOSIS — N644 Mastodynia: Secondary | ICD-10-CM

## 2018-03-25 DIAGNOSIS — C50212 Malignant neoplasm of upper-inner quadrant of left female breast: Secondary | ICD-10-CM

## 2018-03-25 DIAGNOSIS — Z95828 Presence of other vascular implants and grafts: Secondary | ICD-10-CM

## 2018-03-25 DIAGNOSIS — J449 Chronic obstructive pulmonary disease, unspecified: Secondary | ICD-10-CM | POA: Insufficient documentation

## 2018-03-25 DIAGNOSIS — E785 Hyperlipidemia, unspecified: Secondary | ICD-10-CM | POA: Diagnosis not present

## 2018-03-25 LAB — CBC WITH DIFFERENTIAL/PLATELET
Abs Immature Granulocytes: 0.03 10*3/uL (ref 0.00–0.07)
Basophils Absolute: 0.1 10*3/uL (ref 0.0–0.1)
Basophils Relative: 1 %
Eosinophils Absolute: 0.1 10*3/uL (ref 0.0–0.5)
Eosinophils Relative: 1 %
HCT: 45.7 % (ref 36.0–46.0)
Hemoglobin: 15.4 g/dL — ABNORMAL HIGH (ref 12.0–15.0)
Immature Granulocytes: 0 %
Lymphocytes Relative: 35 %
Lymphs Abs: 2.4 10*3/uL (ref 0.7–4.0)
MCH: 30.6 pg (ref 26.0–34.0)
MCHC: 33.7 g/dL (ref 30.0–36.0)
MCV: 90.9 fL (ref 80.0–100.0)
Monocytes Absolute: 0.5 10*3/uL (ref 0.1–1.0)
Monocytes Relative: 7 %
Neutro Abs: 3.8 10*3/uL (ref 1.7–7.7)
Neutrophils Relative %: 56 %
Platelets: 181 10*3/uL (ref 150–400)
RBC: 5.03 MIL/uL (ref 3.87–5.11)
RDW: 13 % (ref 11.5–15.5)
WBC: 6.8 10*3/uL (ref 4.0–10.5)
nRBC: 0 % (ref 0.0–0.2)

## 2018-03-25 LAB — COMPREHENSIVE METABOLIC PANEL
ALT: 18 U/L (ref 0–44)
AST: 22 U/L (ref 15–41)
Albumin: 4.3 g/dL (ref 3.5–5.0)
Alkaline Phosphatase: 47 U/L (ref 38–126)
Anion gap: 11 (ref 5–15)
BUN: 14 mg/dL (ref 8–23)
CO2: 26 mmol/L (ref 22–32)
Calcium: 9.8 mg/dL (ref 8.9–10.3)
Chloride: 104 mmol/L (ref 98–111)
Creatinine, Ser: 0.95 mg/dL (ref 0.44–1.00)
GFR calc Af Amer: >60 mL/min (ref >60)
GFR calc non Af Amer: >60 mL/min (ref >60)
Glucose, Bld: 109 mg/dL — ABNORMAL HIGH (ref 70–99)
Potassium: 3.7 mmol/L (ref 3.5–5.1)
Sodium: 141 mmol/L (ref 135–145)
Total Bilirubin: 1.4 mg/dL — ABNORMAL HIGH (ref 0.3–1.2)
Total Protein: 7.5 g/dL (ref 6.5–8.1)

## 2018-03-25 LAB — BILIRUBIN, DIRECT: Bilirubin, Direct: 0.2 mg/dL (ref 0.0–0.2)

## 2018-03-25 MED ORDER — HEPARIN SOD (PORK) LOCK FLUSH 100 UNIT/ML IV SOLN
500.0000 [IU] | Freq: Once | INTRAVENOUS | Status: AC
  Administered 2018-03-25: 500 [IU] via INTRAVENOUS

## 2018-03-25 MED ORDER — SODIUM CHLORIDE 0.9% FLUSH
10.0000 mL | INTRAVENOUS | Status: DC | PRN
  Administered 2018-03-25: 10 mL via INTRAVENOUS
  Filled 2018-03-25: qty 10

## 2018-03-25 NOTE — Progress Notes (Signed)
[facility] day:  03/25/18  Chief Complaint: Madison Christensen is a 68 y.o. female with stage IA Her2/neu + left breast cancer who is seen for 6 month assessment.  HPI:  The patient was last seen in the medical oncology clinic on 09/03/2017.  At that time,  she denied any acute concerns. Shortness of breath had improved. She had neuropathy in her feet that was not causing difficulties with her ambulation.  Exam revealed stable post-operative changes. LEFT breast was tender to palpation (stable) with palpable scarring.   During the interim, patient is doing well overall. She complains of an area of tender firmness in her LEFT breast. She describes intermittent peri-areolar erythema. She states, "I think it might be scar tissue from where they had to pack me". Her surgery was performed by Dr. Hervey Ard. 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 She notes that her neuropathy does not impose any functional limitations. Patient denies that she has experienced any B symptoms. She denies any interval infections.   Patient advises that she maintains an adequate appetite. She is eating well. Weight today is 235 lb 5.5 oz (106.8 kg), which compared to her last visit to the clinic, represents a 1 pound increase.  Patient denies pain in the clinic today.   Past Medical History:  Diagnosis Date  . Breast cancer ([place]) 2016   left  . Cancer ([place])    skin   . Carcinoma of left breast ([place]) 07/30/2014   1.4 cm, T1c, N0, ER negative, PR negative, HER-2 positive  . Chronic kidney disease    h/o renal insuff  . Complication of anesthesia 2004   oversensitive to noise, lights  . COPD (chronic obstructive pulmonary disease) ([place])   . Depression 1995  . GERD (gastroesophageal reflux disease)   . Hyperlipidemia   . Hypertension   . Personal history of radiation therapy 2016   LEFT lumpectomy    Past Surgical History:  Procedure Laterality Date  .  ABDOMINAL HYSTERECTOMY    . AXILLARY LYMPH NODE BIOPSY Left 08/03/2014   Procedure: AXILLARY LYMPH NODE BIOPSY;  Surgeon: Robert Bellow, MD;  Location: [facility] ORS;  Service: General;  Laterality: Left;  . BACK SURGERY    . BREAST BIOPSY Left   . 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 BREAST LUMPECTOMY WITH AXILLARY LYMPH NODE DISSECTION Left 08/03/2014   Procedure: BREAST LUMPECTOMY WITH AXILLARY LYMPH NODE DISSECTION;  Surgeon: Robert Bellow, MD;  Location: [facility] ORS;  Service: General;  Laterality: Left;  . BREAST SURGERY Left Aug 03, 2014   Wide excision, sentinel node biopsy.  . CHOLECYSTECTOMY    . COLONOSCOPY  2013   Dr. Vira Agar  . COLONOSCOPY WITH PROPOFOL N/A 05/30/2015   Procedure: COLONOSCOPY WITH PROPOFOL;  Surgeon: Manya Silvas, MD;  Location[facility] END[facility]COPY;  Service: Endoscopy;  Laterality: N/A;  . DILATION AND CURETTAGE OF UTERUS N/A 01/16/2017   Procedure: DILATATION AND CURETTAGE;  Surgeon: Ward, Honor Loh, MD;  Location: [facility] ORS;  Service: Gynecology;  Laterality: N/A;  . LAPAROSCOPIC BILATERAL SALPINGO OOPHERECTOMY Bilateral 02/06/2017   Procedure: LAPAROSCOPIC BILATERAL SALPINGO OOPHORECTOMY;  Surgeon: Ward, Honor Loh, MD;  Location: [facility] ORS;  Service: Gynecology;  Laterality: Bilateral;  . LAPAROSCOPIC HYSTERECTOMY N/A 02/06/2017   Procedure: HYSTERECTOMY TOTAL LAPAROSCOPIC;  Surgeon: Ward, Honor Loh, MD;  Location: [facility] ORS;  Service: Gynecology;  Laterality: N/A;  . PORTACATH PLACEMENT Right 08/31/2014   Procedure:  INSERTION PORT-A-CATH;  Surgeon: Robert Bellow, MD;  Location: [facility] ORS;  Service: General;  Laterality: Right;  . REDUCTION MAMMAPLASTY Bilateral 2016  . SENTINEL NODE BIOPSY Left 08/03/2014   Procedure: SENTINEL NODE BIOPSY;  Surgeon: Robert Bellow, MD;  Location: [facility] ORS;  Service: General;  Laterality: Left;  . skin cancer  removal  2015   nose   . TONSILLECTOMY      Family History  Problem Relation Age of Onset   . Breast cancer Mother 1          . Breast cancer Cousin 62            Social History:  reports that she quit smoking about 41 years ago. Her smoking use included cigarettes. She has a 2.00 pack-year smoking history. She has never used smokeless tobacco. She reports current alcohol use. She reports that she does not use drugs.  She is retired from [employer].  She lives in [place].  Her husband died 4 years ago.  Her sister's name is Arville Go.  The patient is alone today.  Allergies:  Allergies  Allergen Reactions  . Codeine Other (See Comments)    Severe constipation  . Losartan Other (See Comments)    Sick on stomach  . Lovastatin Other (See Comments)    Per pt headache  . Sulfa Antibiotics Hives and Rash    Current Medications: Current Outpatient Medications  Medication Sig Dispense Refill  . acetaminophen (TYLENOL) 500 MG tablet Take 500 mg by mouth every 6 (six) hours as needed.    Marland Kitchen albuterol (PROVENTIL HFA;VENTOLIN HFA) 108 (90 Base) MCG/ACT inhaler Inhale 2 puffs into the lungs every 6 (six) hours as needed for wheezing or shortness of breath. 1 Inhaler 2  . aspirin EC 81 MG tablet Take 81 mg daily by mouth.     . cholecalciferol (VITAMIN D) 1000 units tablet Take 1,000 Units by mouth daily.    . citalopram (CELEXA) 20 MG tablet TAKE 1 TABLET (20 MG TOTAL) BY MOUTH DAILY. (Patient taking differently: Take 30 mg daily by mouth. ) 30 tablet 5  . docusate sodium (COLACE) 100 MG capsule Take 100 mg by mouth 2 (two) times daily.    . fluticasone (FLONASE) 50 MCG/ACT nasal spray Place 1 spray into both nostrils daily.     Marland Kitchen gabapentin (NEURONTIN) 100 MG capsule TAKE 1 CAPSULE BY MOUTH THREE TIMES DAILY (Patient taking differently: Take 100 mg by mouth 2 (two) times daily. ) 90 capsule 1  . Multiple Vitamin (MULTIVITAMIN) tablet Take 2 tablets daily by mouth.     . NON FORMULARY Apply 1 application 2 (two) times daily topically. Unscented Free-up Professional Massage Cream Mixed  with Engineer, technical sales    . Polyethyl Glycol-Propyl Glycol (SYSTANE ULTRA) 0.4-0.3 % SOLN Place 1 drop as needed into both eyes (for dry eyes).    . pravastatin (PRAVACHOL) 40 MG tablet Take 40 mg at bedtime by mouth.   3  . spironolactone (ALDACTONE) 25 MG tablet Take 25 mg by mouth 2 (two) times daily.      No current facility-administered medications for this visit.    Facility-Administered Medications Ordered in Other Visits  Medication Dose Route Frequency Provider Last Rate Last Dose  . 0.9 %  sodium chloride infusion   Intravenous Continuous Manya Silvas, MD      . diphenhydrAMINE (BENADRYL) capsule 25 mg  25 mg Oral Once Choksi, Delorise Shiner, MD      . sodium chloride 0.9 %  injection 10 mL  10 mL Intravenous PRN Forest Gleason, MD   10 mL at 09/25/14 1418  . sodium chloride 0.9 % injection 10 mL  10 mL Intravenous PRN Forest Gleason, MD   10 mL at 01/15/15 1325  . sodium chloride flush (NS) 0.9 % injection 10 mL  10 mL Intravenous PRN Lequita Asal, MD   10 mL at 07/22/16 1011    Review of Systems  Constitutional: Negative.  Negative for chills, diaphoresis, fever, malaise/fatigue and weight loss (up 1 pound).       Feels "good".  HENT: Negative for congestion, ear pain, nosebleeds, sinus pain and sore throat.   Eyes: Negative.  Negative for blurred vision, double vision and photophobia.  Respiratory: Positive for shortness of breath (exertional). Negative for cough, hemoptysis and sputum production.   Cardiovascular: Negative.  Negative for chest pain, palpitations, orthopnea, leg swelling and PND.  Gastrointestinal: Negative for abdominal pain, blood in stool, constipation, diarrhea, melena and nausea.       Last colonoscopy 05/30/2015.  Genitourinary: Negative.  Negative for dysuria, frequency, hematuria and urgency.  Musculoskeletal: Negative.  Negative for back pain, falls, joint pain, myalgias and neck pain.  Skin: Negative.  Negative for itching and rash.  Neurological: Positive  for tingling (intermittent neuropathy in toes >> fingers). Negative for dizziness, tremors, focal weakness, weakness and headaches.  Endo/Heme/Allergies: Negative.  Does not bruise/bleed easily.  Psychiatric/Behavioral: Negative.  Negative for depression and memory loss. The patient is not nervous/anxious and does not have insomnia.   All other systems reviewed and are negative.  Performance status (ECOG): 1  Physical Exam: Blood pressure (!) 143/86, pulse 69, temperature (!) 97.1 F (36.2 C), temperature source Tympanic, resp. rate 18, weight 235 lb 5.5 oz (106.8 kg). GENERAL:  Well developed, well nourished, woman sitting comfortably in the exam room in no acute distress. MENTAL STATUS:  Alert and oriented to person, place and time. HEAD:  White hair.  Normocephalic, atraumatic, face symmetric, no Cushingoid features. EYES:  Glasses.  Blue eyes.  Pupils equal round and reactive to light and accomodation.  No conjunctivitis or scleral icterus. ENT:  Oropharynx clear without lesion.  Tongue normal. Mucous membranes moist.  RESPIRATORY:  Clear to auscultation without rales, wheezes or rhonchi. CARDIOVASCULAR:  Regular rate and rhythm without murmur, rub or gallop. BREAST:  Right breast without masses, skin changes or nipple discharge.  Left breast with significant post-operative scarring.  No erythema or induration.  No discrete masses, skin changes or nipple discharge. ABDOMEN:  Soft, non-tender, with active bowel sounds, and no hepatosplenomegaly.  No masses. SKIN:  Keloid right upper chest.  No rashes, ulcers or lesions. EXTREMITIES: No edema, no skin discoloration or tenderness.  No palpable cords. LYMPH NODES: No palpable cervical, supraclavicular, axillary or inguinal adenopathy  NEUROLOGICAL: Unremarkable. PSYCH:  Appropriate.    Appointment on 03/25/2018  Component Date Value Ref Range Status  . Sodium 03/25/2018 141  135 - 145 mmol/L Final  . Potassium 03/25/2018 3.7  3.5 - 5.1  mmol/L Final  . Chloride 03/25/2018 104  98 - 111 mmol/L Final  . CO2 03/25/2018 26  22 - 32 mmol/L Final  . Glucose, Bld 03/25/2018 109* 70 - 99 mg/dL Final  . BUN 03/25/2018 14  8 - 23 mg/dL Final  . Creatinine, Ser 03/25/2018 0.95  0.44 - 1.00 mg/dL Final  . Calcium 03/25/2018 9.8  8.9 - 10.3 mg/dL Final  . Total Protein 03/25/2018 7.5  6.5 - 8.1 g/dL  Final  . Albumin 03/25/2018 4.3  3.5 - 5.0 g/dL Final  . AST 03/25/2018 22  15 - 41 U/L Final  . ALT 03/25/2018 18  0 - 44 U/L Final  . Alkaline Phosphatase 03/25/2018 47  38 - 126 U/L Final  . Total Bilirubin 03/25/2018 1.4* 0.3 - 1.2 mg/dL Final  . GFR calc non Af Amer 03/25/2018 >60  >60 mL/min Final  . GFR calc Af Amer 03/25/2018 >60  >60 mL/min Final  . Anion gap 03/25/2018 11  5 - 15 Final   Performed at [facility], [address], [place], [place] [address]  . WBC 03/25/2018 6.8  4.0 - 10.5 K/uL Final  . RBC 03/25/2018 5.03  3.87 - 5.11 MIL/uL Final  . Hemoglobin 03/25/2018 15.4* 12.0 - 15.0 g/dL Final  . HCT 03/25/2018 45.7  36.0 - 46.0 % Final  . MCV 03/25/2018 90.9  80.0 - 100.0 fL Final  . MCH 03/25/2018 30.6  26.0 - 34.0 pg Final  . MCHC 03/25/2018 33.7  30.0 - 36.0 g/dL Final  . RDW 03/25/2018 13.0  11.5 - 15.5 % Final  . Platelets 03/25/2018 181  150 - 400 K/uL Final  . nRBC 03/25/2018 0.0  0.0 - 0.2 % Final  . Neutrophils Relative % 03/25/2018 56  % Final  . Neutro Abs 03/25/2018 3.8  1.7 - 7.7 K/uL Final  . Lymphocytes Relative 03/25/2018 35  % Final  . Lymphs Abs 03/25/2018 2.4  0.7 - 4.0 K/uL Final  . Monocytes Relative 03/25/2018 7  % Final  . Monocytes Absolute 03/25/2018 0.5  0.1 - 1.0 K/uL Final  . Eosinophils Relative 03/25/2018 1  % Final  . Eosinophils Absolute 03/25/2018 0.1  0.0 - 0.5 K/uL Final  . Basophils Relative 03/25/2018 1  % Final  . Basophils Absolute 03/25/2018 0.1  0.0 - 0.1 K/uL Final  . Immature Granulocytes 03/25/2018 0  % Final  . Abs Immature Granulocytes 03/25/2018 0.03   0.00 - 0.07 K/uL Final   Performed at [facility], [address], [place], [place] [address]    Assessment:  BAYLE CALVO is a 68 y.o. female with stage IA Her2/neu + left breast cancer status wide excision on  08/03/2014.  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  Lymphvascular invasion was indeterminate (+ on original biopsy).  DCIS was present.  Three sentinel lymph nodes were negative.  Tumor was ER -, PR -, and Her2/neu 3+ by IHC.  Pathologic stage was [id number].  She received adjuvant Taxol and Herceptin.  She received 10 of 12 weeks of Taxol (09/04/2014 - 11/06/2014).  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  She received Herceptin x 6 months (09/04/2014 - 03/27/2015).  Herceptin was discontinued secondary to myalgias.  She completed radiation therapy in 02/2015.  Bilateral mammogram on 07/18/2015 revealed no evidence of malignancy.  Bilateral mammogram on 07/22/2016 was benign.  There were scattered areas of fibroglandular density.  Post surgical changes were noted in the left breast.  Diagnostic right mammogram and ultrasound on 12/01/2016 revealed normal appearing breast tissue and postreduction scar in the area of clinical concern in the lower outer right breast.  No mass was seen. Bilateral mammogram on 08/19/2017 revealed mild evolving fat necrosis with dystrophic calcifications in the lower outer aspect of the RIGHT breast. There was no mammographic evidence of malignancy in either breast.  CA27.29 has been followed: 16.3 on 03/11/2016, 9.8 on 07/22/2016, 18.6 on  11/24/2016, 15.5 on 03/26/2017, 13.2 on 09/03/2017, 12.0 on 01/14/2018, and 14.4 on 03/25/2018.  Bone density study on 05/12/2016 revealed osteopenia with a T score of -1.1 in the right femur and -0.4 in the AP spine L1-L2.  She underwent total laparoscopic hysterectomy and bilateral oophorectomy on 02/06/2017 for pelvic pain, adnexal mass, and postmenopausal  bleeding. Pathology revealed no evidence of atypia or malignancy.  She has a history on an elevated bilirubin (indirect).  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  Symptomatically, she is doing well.  She notes some tenderness in her left breast.  Exam reveals post-operative changes.  Bilirubin is 1.4.  Plan: 1. Labs today:  CBC with diff, CMP, CA27.29. 2.   Stage IA Her2/neu + left breast cancer  Clinically doing well.  Exam reveals tender post-operative changes.  Left breast ultrasound.  Bilateral mammogram on 08/20/2018. 3.   Osteopenia  Continue calcium and vitamin D.  Bone density study on 05/13/2018. 4.   Continue port flushes every 6-8 weeks until removal. 5.   RTC in 6 months for MD assessment and labs (CBC with diff, CMP, CA27.29).    Honor Loh, NP  03/25/18, 10:50 AM   I saw and evaluated the patient, participating in the key portions of the service and reviewing pertinent diagnostic studies and records.  I reviewed the nurse practitioner's note and agree with the findings and the plan.  The assessment and plan were discussed with the patient.  Several questions were asked by the patient and answered.   Nolon Stalls, MD 03/25/2018,10:50 AM

## 2018-03-25 NOTE — Progress Notes (Signed)
Pt here for f/u. Denies any concerns at this time.

## 2018-03-26 ENCOUNTER — Ambulatory Visit
Admission: RE | Admit: 2018-03-26 | Discharge: 2018-03-26 | Disposition: A | Discharge status: Home / Self Care | Payer: Medicare Other | Source: Ambulatory Visit | Attending: Urgent Care | Admitting: Urgent Care

## 2018-03-26 DIAGNOSIS — N644 Mastodynia: Secondary | ICD-10-CM

## 2018-03-26 DIAGNOSIS — C50912 Malignant neoplasm of unspecified site of left female breast: Secondary | ICD-10-CM | POA: Insufficient documentation

## 2018-03-26 DIAGNOSIS — Z171 Estrogen receptor negative status [ER-]: Secondary | ICD-10-CM | POA: Diagnosis present

## 2018-03-26 HISTORY — DX: Personal history of antineoplastic chemotherapy: Z92.21

## 2018-03-26 LAB — CANCER ANTIGEN 27.29: CA 27.29: 14.4 U/mL (ref 0.0–38.6)

## 2018-04-05 ENCOUNTER — Other Ambulatory Visit: Payer: Self-pay

## 2018-04-05 ENCOUNTER — Ambulatory Visit: Payer: Medicare Other

## 2018-04-05 ENCOUNTER — Ambulatory Visit
Admission: EM | Admit: 2018-04-05 | Discharge: 2018-04-05 | Disposition: A | Discharge status: Home / Self Care | Payer: Medicare Other | Attending: Family Medicine | Admitting: Family Medicine

## 2018-04-05 DIAGNOSIS — Z87891 Personal history of nicotine dependence: Secondary | ICD-10-CM | POA: Diagnosis not present

## 2018-04-05 DIAGNOSIS — J441 Chronic obstructive pulmonary disease with (acute) exacerbation: Secondary | ICD-10-CM

## 2018-04-05 MED ORDER — DOXYCYCLINE HYCLATE 100 MG PO TABS
100.0000 mg | ORAL_TABLET | Freq: Two times a day (BID) | ORAL | Status: DC
  Administered 2018-04-05: 100 mg via ORAL

## 2018-04-05 MED ORDER — IPRATROPIUM-ALBUTEROL 0.5-2.5 (3) MG/3ML IN SOLN
3.0000 mL | Freq: Once | RESPIRATORY_TRACT | Status: AC
  Administered 2018-04-05: 3 mL via RESPIRATORY_TRACT

## 2018-04-05 MED ORDER — AMOXICILLIN-POT CLAVULANATE 875-125 MG PO TABS: 1.0000 | ORAL_TABLET | Freq: Two times a day (BID) | ORAL | 0 refills | Status: AC

## 2018-04-05 NOTE — ED Provider Notes (Signed)
[facility]    CSN: [id number] Arrival date & time: 04/05/18  [place]     History   Chief Complaint Chief Complaint  Patient presents with  . Shortness of Breath    HPI Madison Christensen is a 68 y.o. female.   The history is provided by the patient.  Shortness of Breath  Associated symptoms: cough and wheezing   URI  Presenting symptoms: congestion, cough and fatigue   Severity:  Moderate Onset quality:  Sudden Duration:  4 weeks Timing:  Constant Progression:  Worsening Chronicity:  New Relieved by:  Nothing Ineffective treatments:  Prescription medications and OTC medications Associated symptoms: wheezing   Risk factors: chronic respiratory disease (copd) and sick contacts     Past Medical History:  Diagnosis Date  . Breast cancer ([place]) 2016   left  . Cancer ([place])    skin   . Carcinoma of left breast ([place]) 07/30/2014   1.4 cm, T1c, N0, ER negative, PR negative, HER-2 positive  . Chronic kidney disease    h/o renal insuff  . Complication of anesthesia 2004   oversensitive to noise, lights  . COPD (chronic obstructive pulmonary disease) ([place])   . Depression 1995  . GERD (gastroesophageal reflux disease)   . Hyperlipidemia   . Hypertension   . Personal history of chemotherapy   . Personal history of radiation therapy 2016   LEFT lumpectomy    Patient Active Problem List   Diagnosis Date Noted  . Osteopenia 03/05/2018  . Pyelonephritis 02/19/2017  . Elevated bilirubin 07/22/2016  . SOB (shortness of breath) 04/29/2016  . Mild chronic obstructive pulmonary disease ([place]) 01/09/2016  . Chronic cough 11/16/2015  . Chronic diarrhea 11/16/2015  . Essential hypertension 11/16/2015  . Hyperlipidemia, unspecified 11/16/2015  . MDD (major depressive disorder), recurrent episode, moderate ([place]) 11/16/2015  . Stage 3 chronic kidney disease ([place]) 11/16/2015  . Cancer ([place]) 10/02/2014  . Malignant neoplasm of left female breast ([place]) 08/09/2014  .  Carcinoma of left breast ([place]) 07/30/2014  . Depression 07/22/2014  . Low potassium syndrome 07/22/2014  . Left breast mass 07/12/2014    Past Surgical History:  Procedure Laterality Date  . ABDOMINAL HYSTERECTOMY    . AXILLARY LYMPH NODE BIOPSY Left 08/03/2014   Procedure: AXILLARY LYMPH NODE BIOPSY;  Surgeon: Robert Bellow, MD;  Location: [facility];  Service: General;  Laterality: Left;  . BACK SURGERY    . BREAST BIOPSY Left   . 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 BREAST LUMPECTOMY WITH AXILLARY LYMPH NODE DISSECTION Left 08/03/2014   Procedure: BREAST LUMPECTOMY WITH AXILLARY LYMPH NODE DISSECTION;  Surgeon: Robert Bellow, MD;  Location: [facility];  Service: General;  Laterality: Left;  . BREAST SURGERY Left Aug 03, 2014   Wide excision, sentinel node biopsy.  . CHOLECYSTECTOMY    . COLONOSCOPY  2013   Dr. Vira Agar  . COLONOSCOPY WITH PROPOFOL N/A 05/30/2015   Procedure: COLONOSCOPY WITH PROPOFOL;  Surgeon: Manya Silvas, MD;  Location: [facility];  Service: Endoscopy;  Laterality: N/A;  . DILATION AND CURETTAGE OF UTERUS N/A 01/16/2017   Procedure: DILATATION AND CURETTAGE;  Surgeon: Ward, Honor Loh, MD;  Location: [facility];  Service: Gynecology;  Laterality: N/A;  . LAPAROSCOPIC BILATERAL SALPINGO OOPHERECTOMY Bilateral 02/06/2017   Procedure: LAPAROSCOPIC BILATERAL SALPINGO OOPHORECTOMY;  Surgeon: Ward, Honor Loh, MD;  Location: [facility];  Service: Gynecology;  Laterality: Bilateral;  . LAPAROSCOPIC HYSTERECTOMY N/A 02/06/2017   Procedure: HYSTERECTOMY  TOTAL LAPAROSCOPIC;  Surgeon: Ward, Honor Loh, MD;  Location: [facility];  Service: Gynecology;  Laterality: N/A;  . OOPHORECTOMY    . PORTACATH PLACEMENT Right 08/31/2014   Procedure: INSERTION PORT-A-CATH;  Surgeon: Robert Bellow, MD;  Location: [facility];  Service: General;  Laterality: Right;  . REDUCTION MAMMAPLASTY Bilateral 2016  . SENTINEL NODE BIOPSY Left 08/03/2014   Procedure: SENTINEL  NODE BIOPSY;  Surgeon: Robert Bellow, MD;  Location: [facility];  Service: General;  Laterality: Left;  . skin cancer  removal  2015   nose   . TONSILLECTOMY      OB History    Gravida  1   Para      Term      Preterm      AB      Living  1     SAB      TAB      Ectopic      Multiple      Live Births           Obstetric Comments  1st Menstrual Cycle:  11 1st Pregnancy:  21          Home Medications    Prior to Admission medications   Medication Sig Start Date End Date Taking? Authorizing Provider  acetaminophen (TYLENOL) 500 MG tablet Take 500 mg by mouth every 6 (six) hours as needed.   Yes [provider]  albuterol (PROVENTIL HFA;VENTOLIN HFA) 108 (90 Base) MCG/ACT inhaler Inhale 2 puffs into the lungs every 6 (six) hours as needed for wheezing or shortness of breath. 05/01/16  Yes Epifanio Lesches, MD  aspirin EC 81 MG tablet Take 81 mg daily by mouth.    Yes [provider]  benzonatate (TESSALON) 200 MG capsule TAKE 1 CAPSULE BY MOUTH THREE TIMES DAILY AS NEEDED FOR COUGH FOR UP TO 7 DAYS 04/02/18  Yes [provider]  cholecalciferol (VITAMIN D) 1000 units tablet Take 1,000 Units by mouth daily.   Yes [provider]  citalopram (CELEXA) 20 MG tablet TAKE 1 TABLET (20 MG TOTAL) BY MOUTH DAILY. Patient taking differently: Take 30 mg daily by mouth.  11/27/15  Yes Lloyd Huger, MD  docusate sodium (COLACE) 100 MG capsule Take 100 mg by mouth 2 (two) times daily.   Yes [provider]  fluticasone (FLONASE) 50 MCG/ACT nasal spray Place 1 spray into both nostrils daily.  02/09/18 02/09/19 Yes [provider]  gabapentin (NEURONTIN) 100 MG capsule TAKE 1 CAPSULE BY MOUTH THREE TIMES DAILY Patient taking differently: Take 100 mg by mouth 2 (two) times daily.  07/15/16  Yes Lequita Asal, MD  methylPREDNISolone (MEDROL DOSEPAK) 4 MG TBPK tablet See admin instructions. see package 04/02/18  Yes  [provider]  Multiple Vitamin (MULTIVITAMIN) tablet Take 2 tablets daily by mouth.    Yes [provider]  NON FORMULARY Apply 1 application 2 (two) times daily topically. Unscented Free-up Professional Massage Cream Mixed with Engineer, technical sales   Yes [provider]  Polyethyl Glycol-Propyl Glycol (SYSTANE ULTRA) 0.4-0.3 % SOLN Place 1 drop as needed into both eyes (for dry eyes).   Yes [provider]  pravastatin (PRAVACHOL) 40 MG tablet Take 40 mg at bedtime by mouth.  06/14/14  Yes [provider]  spironolactone (ALDACTONE) 25 MG tablet Take 25 mg by mouth 2 (two) times daily.  01/09/16 04/05/18 Yes [provider]  amoxicillin-clavulanate (AUGMENTIN) 875-125 MG tablet Take 1 tablet by mouth 2 (  two) times daily. 04/05/18   Norval Gable, MD    Family History Family History  Problem Relation Age of Onset  . Breast cancer Mother 41          . Breast cancer Cousin 62            Social History Social History   Tobacco Use  . Smoking status: Former Smoker    Packs/day: 1.00    Years: 2.00    Pack years: 2.00    Types: Cigarettes    Last attempt to quit: 07/30/1976    Years since quitting: 41.7  . Smokeless tobacco: Never Used  Substance Use Topics  . Alcohol use: Yes    Alcohol/week: 0.0 - 2.0 standard drinks  . Drug use: No     Allergies   Codeine; Losartan; Lovastatin; and Sulfa antibiotics   Review of Systems Review of Systems  Constitutional: Positive for fatigue.  HENT: Positive for congestion.   Respiratory: Positive for cough, shortness of breath and wheezing.      Physical Exam Triage Vital Signs ED Triage Vitals  Enc Vitals Group     BP 04/05/18 1902 (!) 172/80     Pulse Rate 04/05/18 1902 74     Resp 04/05/18 1902 18     Temp 04/05/18 1902 97.9 F (36.6 C)     Temp Source 04/05/18 1902 Oral     SpO2 04/05/18 1902 97 %     Weight 04/05/18 1859 230 lb (104.3 kg)     Height 04/05/18 1859 5' 4"  (1.626 m)       Head Circumference --      Peak Flow --      Pain Score 04/05/18 1859 0     Pain Loc --      Pain Edu? --      Excl. in [place]? --    No data found.  Updated Vital Signs BP (!) 172/80 (BP Location: Right Arm)   Pulse 74   Temp 97.9 F (36.6 C) (Oral)   Resp 18   Ht 5' 4"  (1.626 m)   Wt 104.3 kg   SpO2 97%   BMI 39.48 kg/m   Visual Acuity Right Eye Distance:   Left Eye Distance:   Bilateral Distance:    Right Eye Near:   Left Eye Near:    Bilateral Near:     Physical Exam Vitals signs and nursing note reviewed.  Constitutional:      General: She is not in acute distress.    Appearance: She is well-developed. She is not toxic-appearing or diaphoretic.  Cardiovascular:     Rate and Rhythm: Normal rate and regular rhythm.     Heart sounds: Normal heart sounds.  Pulmonary:     Effort: Pulmonary effort is normal. No respiratory distress.     Breath sounds: No stridor. Wheezing and rales present. No rhonchi.  Neurological:     Mental Status: She is alert.      UC Treatments / Results  Labs (all labs ordered are listed, but only abnormal results are displayed) Labs Reviewed - No data to display  EKG None  Radiology Dg Chest 2 View  Result Date: 04/05/2018 CLINICAL DATA:  Cough, shortness of Breath EXAM: CHEST - 2 VIEW COMPARISON:  04/30/2016 FINDINGS: Right Port-A-Cath remains in place, unchanged. Heart and mediastinal contours are within normal limits. No focal opacities or effusions. No acute bony abnormality. IMPRESSION: No active cardiopulmonary disease. Electronically Signed   By: Rolm Baptise  M.D.   On: 04/05/2018 21:32    Procedures Procedures (including critical care time)  Medications Ordered in UC Medications  doxycycline (VIBRA-TABS) tablet 100 mg (100 mg Oral Given 04/05/18 2155)  ipratropium-albuterol (DUONEB) 0.5-2.5 (3) MG/3ML nebulizer solution 3 mL (3 mLs Nebulization Given 04/05/18 2134)    Initial Impression / Assessment and Plan / UC  Course  I have reviewed the triage vital signs and the nursing notes.  Pertinent labs & imaging results that were available during my care of the patient were reviewed by me and considered in my medical decision making (see chart for details).      Final Clinical Impressions(s) / UC Diagnoses   Final diagnoses:  COPD exacerbation [facility])    ED Prescriptions    Medication Sig Dispense Auth. Provider   amoxicillin-clavulanate (AUGMENTIN) 875-125 MG tablet Take 1 tablet by mouth 2 (two) times daily. 20 tablet Norval Gable, MD      1. x-ray results and diagnosis reviewed with patient; dose of doxycycline given in clinic 2. rx as per orders above; reviewed possible side effects, interactions, risks and benefits  3. Recommend continue steroid and albuterol  4. Follow-up prn if symptoms worsen or don't improve  Controlled Substance Prescriptions [place] Controlled Substance Registry consulted? Not Applicable   Norval Gable, MD 04/05/18 2217

## 2018-04-05 NOTE — ED Triage Notes (Signed)
Patient complains of cough, congestion, wheezing that started around Christmas. Patient states that symptoms have been off and on and states that she saw her primary office clinic on Friday and was given steroid. States that today her breathing worsened and she noticed wheezing. States that she has a follow up appt with doctor tomorrow.

## 2018-05-17 ENCOUNTER — Inpatient Hospital Stay: Admission: RE | Admit: 2018-05-17 | Payer: Medicare Other | Source: Ambulatory Visit

## 2018-05-20 ENCOUNTER — Inpatient Hospital Stay: Payer: Medicare Other | Attending: Hematology and Oncology

## 2018-05-20 ENCOUNTER — Other Ambulatory Visit: Payer: Self-pay

## 2018-05-20 DIAGNOSIS — Z853 Personal history of malignant neoplasm of breast: Secondary | ICD-10-CM | POA: Insufficient documentation

## 2018-05-20 DIAGNOSIS — Z452 Encounter for adjustment and management of vascular access device: Secondary | ICD-10-CM | POA: Diagnosis present

## 2018-05-20 DIAGNOSIS — Z95828 Presence of other vascular implants and grafts: Secondary | ICD-10-CM

## 2018-05-20 MED ORDER — SODIUM CHLORIDE 0.9% FLUSH
10.0000 mL | INTRAVENOUS | Status: DC | PRN
  Administered 2018-05-20: 10 mL via INTRAVENOUS
  Filled 2018-05-20: qty 10

## 2018-05-20 MED ORDER — HEPARIN SOD (PORK) LOCK FLUSH 100 UNIT/ML IV SOLN
500.0000 [IU] | Freq: Once | INTRAVENOUS | Status: AC
  Administered 2018-05-20: 500 [IU] via INTRAVENOUS

## 2018-07-14 ENCOUNTER — Other Ambulatory Visit: Payer: Self-pay

## 2018-07-15 ENCOUNTER — Inpatient Hospital Stay: Payer: Medicare Other

## 2018-09-14 NOTE — Progress Notes (Signed)
[facility]  [address] [place], [place] [address] Phone: [phone]  Fax: [phone]   Clinic Day:  09/16/2018  Referring physician: Sharyne Peach, MD  Chief Complaint: Madison Christensen is a 68 y.o. female with stage IA Her2/neu + left breast cancer who is seen for 6 month assessment.  HPI: The patient was last seen in the medical oncology clinic on 03/25/2018. At that time, she was doing well. She noted some tenderness in her left breast. Exam revealed post-operative changes. Bilirubin was 1.4 (direct 0.2).  Left breast mammogram on 03/26/2018 revealed probable postoperative changes in the left breast.  Ultrasound revealed a 1.7 x 0.8 x 1.3 cm complex cystic mass at 12 o'clock 2 cm from the nipple felt secondary to postoperative hematoma/seroma.  Recommendation was bilateral diagnostic mammogram and left breast ultrasound in 6 months.  CXR on 04/05/2018 revealed no active cardiopulmonary disease.  During the interim, the patient states "I feel good". She reports back pain and burning sensations. She notes knocking off a mole that was on her back that bleed a lot which she attributes to taking aspirin. 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 She likes to go walking everyday for at least an hour. She has shortness of breath on exertion.   She notes a rash on her left upper abdomen and reports it does not itch. She thinks it could be from her new dove soap or from a trip she took to the mountains and being outdoors.    Past Medical History:  Diagnosis Date  . Breast cancer ([place]) 2016   left  . Cancer ([place])    skin   . Carcinoma of left breast ([place]) 07/30/2014   1.4 cm, T1c, N0, ER negative, PR negative, HER-2 positive  . Chronic kidney disease    h/o renal insuff  . Complication of anesthesia 2004   oversensitive to noise, lights  . COPD (chronic obstructive pulmonary disease) ([place])   . Depression 1995  . GERD (gastroesophageal reflux disease)    . Hyperlipidemia   . Hypertension   . Personal history of chemotherapy   . Personal history of radiation therapy 2016   LEFT lumpectomy    Past Surgical History:  Procedure Laterality Date  . ABDOMINAL HYSTERECTOMY    . AXILLARY LYMPH NODE BIOPSY Left 08/03/2014   Procedure: AXILLARY LYMPH NODE BIOPSY;  Surgeon: Robert Bellow, MD;  Location: [facility] ORS;  Service: General;  Laterality: Left;  . BACK SURGERY    . BREAST BIOPSY Left   . 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 BREAST LUMPECTOMY WITH AXILLARY LYMPH NODE DISSECTION Left 08/03/2014   Procedure: BREAST LUMPECTOMY WITH AXILLARY LYMPH NODE DISSECTION;  Surgeon: Robert Bellow, MD;  Location: [facility] ORS;  Service: General;  Laterality: Left;  . BREAST SURGERY Left Aug 03, 2014   Wide excision, sentinel node biopsy.  . CHOLECYSTECTOMY    . COLONOSCOPY  2013   Dr. Vira Agar  . COLONOSCOPY WITH PROPOFOL N/A 05/30/2015   Procedure: COLONOSCOPY WITH PROPOFOL;  Surgeon: Manya Silvas, MD;  Location: [facility] ENDOSCOPY;  Service: Endoscopy;  Laterality: N/A;  . DILATION AND CURETTAGE OF UTERUS N/A 01/16/2017   Procedure: DILATATION AND CURETTAGE;  Surgeon: Ward, Honor Loh, MD;  Location: [facility] ORS;  Service: Gynecology;  Laterality: N/A;  . LAPAROSCOPIC BILATERAL SALPINGO OOPHERECTOMY Bilateral 02/06/2017   Procedure: LAPAROSCOPIC BILATERAL SALPINGO OOPHORECTOMY;  Surgeon: Ward, Honor Loh, MD;  Location: [facility] ORS;  Service: Gynecology;  Laterality: Bilateral;  . LAPAROSCOPIC HYSTERECTOMY N/A 02/06/2017   Procedure: HYSTERECTOMY TOTAL LAPAROSCOPIC;  Surgeon: Ward, Honor Loh, MD;  Location: [facility] ORS;  Service: Gynecology;  Laterality: N/A;  . OOPHORECTOMY    . PORTACATH PLACEMENT Right 08/31/2014   Procedure: INSERTION PORT-A-CATH;  Surgeon: Robert Bellow, MD;  Location: [facility] ORS;  Service: General;  Laterality: Right;  . REDUCTION MAMMAPLASTY Bilateral 2016  . SENTINEL NODE BIOPSY Left 08/03/2014   Procedure:  SENTINEL NODE BIOPSY;  Surgeon: Robert Bellow, MD;  Location: [facility] ORS;  Service: General;  Laterality: Left;  . skin cancer  removal  2015   nose   . TONSILLECTOMY      Family History  Problem Relation Age of Onset  . Breast cancer Mother 78          . Breast cancer Cousin 62            Social History:  reports that she quit smoking about 42 years ago. Her smoking use included cigarettes. She has a 2.00 pack-year smoking history. She has never used smokeless tobacco. She reports current alcohol use. She reports that she does not use drugs. She is retired from [employer].  She lives in [place].  Her husband died 4 years ago.  Her sister's name is Arville Go. The patient is alone today.  Allergies:  Allergies  Allergen Reactions  . Codeine Other (See Comments)    Severe constipation  . Losartan Other (See Comments)    Sick on stomach  . Lovastatin Other (See Comments)    Per pt headache  . Sulfa Antibiotics Hives and Rash    Current Medications: Current Outpatient Medications  Medication Sig Dispense Refill  . acetaminophen (TYLENOL) 500 MG tablet Take 500 mg by mouth every 6 (six) hours as needed.    Marland Kitchen aspirin EC 81 MG tablet Take 81 mg daily by mouth.     . cholecalciferol (VITAMIN D) 1000 units tablet Take 1,000 Units by mouth daily.    . citalopram (CELEXA) 20 MG tablet TAKE 1 TABLET (20 MG TOTAL) BY MOUTH DAILY. (Patient taking differently: Take 30 mg daily by mouth. ) 30 tablet 5  . docusate sodium (COLACE) 100 MG capsule Take 100 mg by mouth 2 (two) times daily.    . fluticasone (FLONASE) 50 MCG/ACT nasal spray Place 1 spray into both nostrils daily.     Marland Kitchen gabapentin (NEURONTIN) 100 MG capsule TAKE 1 CAPSULE BY MOUTH THREE TIMES DAILY (Patient taking differently: Take 100 mg by mouth 2 (two) times daily. ) 90 capsule 1  . Multiple Vitamin (MULTIVITAMIN) tablet Take 2 tablets daily by mouth.     Vladimir Faster Glycol-Propyl Glycol (SYSTANE ULTRA) 0.4-0.3 % SOLN Place 1 drop  as needed into both eyes (for dry eyes).    . pravastatin (PRAVACHOL) 40 MG tablet Take 40 mg at bedtime by mouth.   3  . spironolactone (ALDACTONE) 25 MG tablet Take 25 mg by mouth 2 (two) times daily.     Marland Kitchen albuterol (PROVENTIL HFA;VENTOLIN HFA) 108 (90 Base) MCG/ACT inhaler Inhale 2 puffs into the lungs every 6 (six) hours as needed for wheezing or shortness of breath. (Patient not taking: Reported on 09/16/2018) 1 Inhaler 2  . amoxicillin-clavulanate (AUGMENTIN) 875-125 MG tablet Take 1 tablet by mouth 2 (two) times daily. (Patient not taking: Reported on 09/16/2018) 20 tablet 0  . benzonatate (TESSALON) 200 MG capsule TAKE 1 CAPSULE BY MOUTH THREE TIMES DAILY AS NEEDED FOR COUGH FOR UP  TO 7 DAYS    . methylPREDNISolone (MEDROL DOSEPAK) 4 MG TBPK tablet See admin instructions. see package    . NON FORMULARY Apply 1 application 2 (two) times daily topically. Unscented Free-up Professional Massage Cream Mixed with Lavendar     No current facility-administered medications for this visit.    Facility-Administered Medications Ordered in Other Visits  Medication Dose Route Frequency Provider Last Rate Last Dose  . 0.9 %  sodium chloride infusion   Intravenous Continuous Manya Silvas, MD      . diphenhydrAMINE (BENADRYL) capsule 25 mg  25 mg Oral Once Choksi, Delorise Shiner, MD      . sodium chloride 0.9 % injection 10 mL  10 mL Intravenous PRN Forest Gleason, MD   10 mL at 09/25/14 1418  . sodium chloride 0.9 % injection 10 mL  10 mL Intravenous PRN Forest Gleason, MD   10 mL at 01/15/15 1325  . sodium chloride flush (NS) 0.9 % injection 10 mL  10 mL Intravenous PRN Lequita Asal, MD   10 mL at 07/22/16 1011    Review of Systems  Constitutional: Negative.  Negative for chills, diaphoresis, fever, malaise/fatigue and weight loss (up 3 pound).       "I feel real good".  HENT: Negative for congestion, ear pain, nosebleeds, sinus pain and sore throat.   Eyes: Negative.  Negative for blurred vision,  double vision and photophobia.  Respiratory: Positive for shortness of breath (exertional). Negative for cough, hemoptysis and sputum production.   Cardiovascular: Negative.  Negative for chest pain, palpitations, orthopnea, leg swelling and PND.  Gastrointestinal: Negative for abdominal pain, blood in stool, constipation, diarrhea, melena and nausea.       Last colonoscopy 05/30/2015.  Genitourinary: Negative.  Negative for dysuria, frequency, hematuria and urgency.  Musculoskeletal: Positive for back pain (burning sensation). Negative for falls, joint pain, myalgias and neck pain.  Skin: Positive for rash (upper left abdomen). Negative for itching.  Neurological: Positive for tingling (intermittent neuropathy in toes >> fingers). Negative for dizziness, tremors, focal weakness, weakness and headaches.  Endo/Heme/Allergies: Negative.  Does not bruise/bleed easily.  Psychiatric/Behavioral: Negative.  Negative for depression and memory loss. The patient is not nervous/anxious and does not have insomnia.   All other systems reviewed and are negative.   Performance status (ECOG): 1  Vital Signs Blood pressure 130/80, pulse 84, temperature (!) 97.4 F (36.3 C), temperature source Tympanic, resp. rate 18, height _0  (1.626 m), weight 238 lb 1.6 oz (108 kg).  Physical Exam  Constitutional: She is oriented to person, place, and time. She appears well-developed and well-nourished. No distress.  Woman sitting comfortably in the exam room in no acute distress.  HENT:  Head: Normocephalic and atraumatic.  Mouth/Throat: Oropharynx is clear and moist. No oropharyngeal exudate.  White hair.  Eyes: Pupils are equal, round, and reactive to light. Conjunctivae and EOM are normal. No scleral icterus.  Glasses. Blue eyes.  Neck: Normal range of motion. Neck supple.  Cardiovascular: Normal rate, regular rhythm and normal heart sounds.  No murmur heard. Pulmonary/Chest: Effort normal and breath sounds  normal. No respiratory distress. Left breast exhibits tenderness.  Left breast with significant post -operative scarring.  Abdominal: Soft. Bowel sounds are normal. There is no abdominal tenderness.  Musculoskeletal: Normal range of motion.        General: No tenderness or edema.  Lymphadenopathy:    She has no cervical adenopathy.  Neurological: She is alert and oriented to person, place,  and time. She has normal reflexes.  Skin: Skin is warm and dry. Rash (left upper abdomen) noted. She is not diaphoretic.  Keloid right upper chest.  Psychiatric: She has a normal mood and affect. Her behavior is normal. Judgment and thought content normal.  Nursing note and vitals reviewed.       Abdominal rash   Imaging studies: 07/18/2015:  Bilateral mammogram revealed no evidence of malignancy.   07/22/2016:  Bilateral mammogram was benign.  There were scattered areas of fibroglandular density.  Post surgical changes were noted in the left breast. 12/01/2016:  Diagnostic right mammogram and ultrasound revealed normal appearing breast tissue and postreduction scar in the area of clinical concern in the lower outer right breast.  No mass was seen.  08/19/2017:  Bilateral mammogram revealed mild evolving fat necrosis with dystrophic calcifications in the lower outer aspect of the RIGHT breast. There was no mammographic evidence of malignancy in either breast.   03/26/2018:  Left breast mammogram and ultrasound revealed probable postoperative changes in the left breast.  There was a 1.7 x 0.8 x 1.3 cm complex cystic mass at 12 o'clock 2 cm from the nipple felt secondary to postoperative hematoma/seroma.  Recommendation was bilateral diagnostic mammogram and left breast ultrasound in 6 months.   Infusion on 09/16/2018  Component Date Value Ref Range Status  . WBC 09/16/2018 6.1  4.0 - 10.5 K/uL Final  . RBC 09/16/2018 4.80  3.87 - 5.11 MIL/uL Final  . Hemoglobin 09/16/2018 14.6  12.0 - 15.0 g/dL Final  .  HCT 09/16/2018 43.5  36.0 - 46.0 % Final  . MCV 09/16/2018 90.6  80.0 - 100.0 fL Final  . MCH 09/16/2018 30.4  26.0 - 34.0 pg Final  . MCHC 09/16/2018 33.6  30.0 - 36.0 g/dL Final  . RDW 09/16/2018 12.8  11.5 - 15.5 % Final  . Platelets 09/16/2018 172  150 - 400 K/uL Final  . nRBC 09/16/2018 0.0  0.0 - 0.2 % Final  . Neutrophils Relative % 09/16/2018 57  % Final  . Neutro Abs 09/16/2018 3.5  1.7 - 7.7 K/uL Final  . Lymphocytes Relative 09/16/2018 32  % Final  . Lymphs Abs 09/16/2018 2.0  0.7 - 4.0 K/uL Final  . Monocytes Relative 09/16/2018 8  % Final  . Monocytes Absolute 09/16/2018 0.5  0.1 - 1.0 K/uL Final  . Eosinophils Relative 09/16/2018 2  % Final  . Eosinophils Absolute 09/16/2018 0.1  0.0 - 0.5 K/uL Final  . Basophils Relative 09/16/2018 1  % Final  . Basophils Absolute 09/16/2018 0.1  0.0 - 0.1 K/uL Final  . Immature Granulocytes 09/16/2018 0  % Final  . Abs Immature Granulocytes 09/16/2018 0.01  0.00 - 0.07 K/uL Final   Performed at [facility], [address], [place], [place] [address]    Assessment:  Madison Christensen is a 68 y.o. female with stage IA Her2/neu + left breast cancer status wide excision on  08/03/2014.  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  Lymphvascular invasion was indeterminate (+ on original biopsy).  DCIS was present.  Three sentinel lymph nodes were negative.  Tumor was ER -, PR -, and Her2/neu 3+ by IHC.  Pathologic stage was T1cN0M0.  She received adjuvant Taxol and Herceptin.  She received 10 of 12 weeks of Taxol (09/04/2014 - 11/06/2014).  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  She received Herceptin x 6 months (09/04/2014 - 03/27/2015).  Herceptin was  discontinued secondary to myalgias.  She completed radiation therapy in 02/2015.  Left breast mammogram on 03/26/2018 revealed probable postoperative changes in the left breast.  Ultrasound revealed a 1.7 x 0.8 x 1.3 cm complex  cystic mass at 12 o'clock 2 cm from the nipple felt secondary to postoperative hematoma/seroma.  Recommendation was bilateral diagnostic mammogram and left breast ultrasound in 6 months.  CA27.29 has been followed: 16.3 on 03/11/2016, 9.8 on 07/22/2016, 18.6 on 11/24/2016, 15.5 on 03/26/2017, 13.2 on 09/03/2017, 12.0 on 01/14/2018, and 14.4 on 03/25/2018.  Bone density on 05/12/2016 revealed osteopenia with a T score of -1.1 in the right femur and -0.4 in the AP spine L1-L2.  She underwent total laparoscopic hysterectomy and bilateral oophorectomy on 02/06/2017 for pelvic pain, adnexal mass, and postmenopausal bleeding. Pathology revealed no evidence of atypia or malignancy.  She has a history on an elevated bilirubin (indirect).  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  Symptomatically, she feels good.  Breast exam is stable.  She has a rash.  Plan: 1.   Labs today: CBC with diff, CMP, CA 27.29. 2.   Stage IA Her2/neu + left breast cancer             Clinically, she is doing well.    Exam reveals no evidence of recurrent disease.    Left breast mammogram on 03/26/2018 revealed probable postoperative changes.  Follow-up bilateral diagnostic mammogram and left breast ultrasound on 10/01/2018. 3.   Osteopenia             Continue calcium and vitamin D.    Re-schedule bone density study. 4.   Rash  Follow-up with Dr Iona Beard if does not resolve. 5.   Port flushesevery 8-12 weeksuntil removal. 6.   RTC in 6 months for MD assessment and labs (CBC with diff, CMP, CA27.29).   I discussed the assessment and treatment plan with the patient.  The patient was provided an opportunity to ask questions and all were answered.  The patient agreed with the plan and demonstrated an understanding of the instructions.  The patient was advised to call back if the symptoms worsen or if the condition fails to improve as anticipated.  I provided 20 minutes of face-to-face time during this this encounter and > 50%  was spent counseling as documented under my assessment and plan.    Lequita Asal, MD, PhD    09/16/2018, 10:29 AM  I, Selena Batten, am acting as scribe for [employer]. Mike Gip, MD, PhD.  I, Melissa C. Mike Gip, MD, have reviewed the above documentation for accuracy and completeness, and I agree with the above.

## 2018-09-15 ENCOUNTER — Other Ambulatory Visit: Payer: Self-pay

## 2018-09-16 ENCOUNTER — Inpatient Hospital Stay: Payer: Medicare Other | Attending: Hematology and Oncology | Admitting: Hematology and Oncology

## 2018-09-16 ENCOUNTER — Other Ambulatory Visit: Payer: Self-pay

## 2018-09-16 ENCOUNTER — Inpatient Hospital Stay: Payer: Medicare Other

## 2018-09-16 ENCOUNTER — Encounter: Payer: Self-pay | Admitting: Hematology and Oncology

## 2018-09-16 VITALS — BP 130/80 | HR 84 | Temp 97.4°F | Resp 18 | Ht 64.0 in | Wt 238.1 lb

## 2018-09-16 DIAGNOSIS — Z87891 Personal history of nicotine dependence: Secondary | ICD-10-CM | POA: Insufficient documentation

## 2018-09-16 DIAGNOSIS — Z171 Estrogen receptor negative status [ER-]: Secondary | ICD-10-CM | POA: Insufficient documentation

## 2018-09-16 DIAGNOSIS — J449 Chronic obstructive pulmonary disease, unspecified: Secondary | ICD-10-CM

## 2018-09-16 DIAGNOSIS — G620 Drug-induced polyneuropathy: Secondary | ICD-10-CM | POA: Diagnosis not present

## 2018-09-16 DIAGNOSIS — M85851 Other specified disorders of bone density and structure, right thigh: Secondary | ICD-10-CM

## 2018-09-16 DIAGNOSIS — Z9221 Personal history of antineoplastic chemotherapy: Secondary | ICD-10-CM

## 2018-09-16 DIAGNOSIS — R21 Rash and other nonspecific skin eruption: Secondary | ICD-10-CM | POA: Diagnosis not present

## 2018-09-16 DIAGNOSIS — I129 Hypertensive chronic kidney disease with stage 1 through stage 4 chronic kidney disease, or unspecified chronic kidney disease: Secondary | ICD-10-CM | POA: Diagnosis not present

## 2018-09-16 DIAGNOSIS — Z79899 Other long term (current) drug therapy: Secondary | ICD-10-CM | POA: Diagnosis not present

## 2018-09-16 DIAGNOSIS — C50912 Malignant neoplasm of unspecified site of left female breast: Secondary | ICD-10-CM

## 2018-09-16 DIAGNOSIS — Z7982 Long term (current) use of aspirin: Secondary | ICD-10-CM | POA: Diagnosis not present

## 2018-09-16 DIAGNOSIS — E785 Hyperlipidemia, unspecified: Secondary | ICD-10-CM | POA: Diagnosis not present

## 2018-09-16 DIAGNOSIS — M549 Dorsalgia, unspecified: Secondary | ICD-10-CM | POA: Diagnosis not present

## 2018-09-16 DIAGNOSIS — N189 Chronic kidney disease, unspecified: Secondary | ICD-10-CM | POA: Insufficient documentation

## 2018-09-16 DIAGNOSIS — M8580 Other specified disorders of bone density and structure, unspecified site: Secondary | ICD-10-CM | POA: Diagnosis not present

## 2018-09-16 DIAGNOSIS — C50212 Malignant neoplasm of upper-inner quadrant of left female breast: Secondary | ICD-10-CM | POA: Diagnosis not present

## 2018-09-16 DIAGNOSIS — Z923 Personal history of irradiation: Secondary | ICD-10-CM | POA: Diagnosis not present

## 2018-09-16 DIAGNOSIS — K219 Gastro-esophageal reflux disease without esophagitis: Secondary | ICD-10-CM | POA: Diagnosis not present

## 2018-09-16 DIAGNOSIS — Z803 Family history of malignant neoplasm of breast: Secondary | ICD-10-CM | POA: Insufficient documentation

## 2018-09-16 LAB — BILIRUBIN, DIRECT: Bilirubin, Direct: 0.2 mg/dL (ref 0.0–0.2)

## 2018-09-16 LAB — CBC WITH DIFFERENTIAL/PLATELET
Abs Immature Granulocytes: 0.01 10*3/uL (ref 0.00–0.07)
Basophils Absolute: 0.1 10*3/uL (ref 0.0–0.1)
Basophils Relative: 1 %
Eosinophils Absolute: 0.1 10*3/uL (ref 0.0–0.5)
Eosinophils Relative: 2 %
HCT: 43.5 % (ref 36.0–46.0)
Hemoglobin: 14.6 g/dL (ref 12.0–15.0)
Immature Granulocytes: 0 %
Lymphocytes Relative: 32 %
Lymphs Abs: 2 10*3/uL (ref 0.7–4.0)
MCH: 30.4 pg (ref 26.0–34.0)
MCHC: 33.6 g/dL (ref 30.0–36.0)
MCV: 90.6 fL (ref 80.0–100.0)
Monocytes Absolute: 0.5 10*3/uL (ref 0.1–1.0)
Monocytes Relative: 8 %
Neutro Abs: 3.5 10*3/uL (ref 1.7–7.7)
Neutrophils Relative %: 57 %
Platelets: 172 10*3/uL (ref 150–400)
RBC: 4.8 MIL/uL (ref 3.87–5.11)
RDW: 12.8 % (ref 11.5–15.5)
WBC: 6.1 10*3/uL (ref 4.0–10.5)
nRBC: 0 % (ref 0.0–0.2)

## 2018-09-16 LAB — COMPREHENSIVE METABOLIC PANEL
ALT: 25 U/L (ref 0–44)
AST: 26 U/L (ref 15–41)
Albumin: 4.1 g/dL (ref 3.5–5.0)
Alkaline Phosphatase: 48 U/L (ref 38–126)
Anion gap: 10 (ref 5–15)
BUN: 15 mg/dL (ref 8–23)
CO2: 24 mmol/L (ref 22–32)
Calcium: 8.9 mg/dL (ref 8.9–10.3)
Chloride: 103 mmol/L (ref 98–111)
Creatinine, Ser: 0.9 mg/dL (ref 0.44–1.00)
GFR calc Af Amer: >60 mL/min (ref >60)
GFR calc non Af Amer: >60 mL/min (ref >60)
Glucose, Bld: 106 mg/dL — ABNORMAL HIGH (ref 70–99)
Potassium: 3.5 mmol/L (ref 3.5–5.1)
Sodium: 137 mmol/L (ref 135–145)
Total Bilirubin: 1.1 mg/dL (ref 0.3–1.2)
Total Protein: 7.3 g/dL (ref 6.5–8.1)

## 2018-09-16 MED ORDER — SODIUM CHLORIDE 0.9% FLUSH
10.0000 mL | INTRAVENOUS | Status: DC | PRN
  Administered 2018-09-16: 10:00:00 10 mL via INTRAVENOUS
  Filled 2018-09-16: qty 10

## 2018-09-16 MED ORDER — HEPARIN SOD (PORK) LOCK FLUSH 100 UNIT/ML IV SOLN
500.0000 [IU] | Freq: Once | INTRAVENOUS | Status: AC
  Administered 2018-09-16: 500 [IU] via INTRAVENOUS
  Filled 2018-09-16: qty 5

## 2018-09-16 NOTE — Progress Notes (Signed)
Patient c/o pain to mid back ( there is a spot she want Dr Mike Gip to look at) she think it was a mole that has falling off

## 2018-09-17 LAB — CANCER ANTIGEN 27.29: CA 27.29: 16.7 U/mL (ref 0.0–38.6)

## 2018-10-01 ENCOUNTER — Ambulatory Visit
Admission: RE | Admit: 2018-10-01 | Discharge: 2018-10-01 | Disposition: A | Discharge status: Home / Self Care | Payer: Medicare Other | Source: Ambulatory Visit | Attending: Hematology and Oncology | Admitting: Hematology and Oncology

## 2018-10-01 DIAGNOSIS — Z171 Estrogen receptor negative status [ER-]: Secondary | ICD-10-CM

## 2018-10-01 DIAGNOSIS — C50212 Malignant neoplasm of upper-inner quadrant of left female breast: Secondary | ICD-10-CM

## 2018-10-27 ENCOUNTER — Other Ambulatory Visit: Payer: Self-pay

## 2018-10-27 ENCOUNTER — Ambulatory Visit
Admission: RE | Admit: 2018-10-27 | Discharge: 2018-10-27 | Disposition: A | Discharge status: Home / Self Care | Payer: Medicare Other | Source: Ambulatory Visit | Attending: Urgent Care | Admitting: Urgent Care

## 2018-10-27 DIAGNOSIS — M85851 Other specified disorders of bone density and structure, right thigh: Secondary | ICD-10-CM

## 2018-12-08 ENCOUNTER — Other Ambulatory Visit: Payer: Self-pay

## 2018-12-09 ENCOUNTER — Inpatient Hospital Stay: Payer: Medicare Other | Attending: Family Medicine

## 2019-03-09 ENCOUNTER — Inpatient Hospital Stay: Payer: Medicare Other

## 2019-03-09 NOTE — Progress Notes (Deleted)
[facility]  [address], [address] [place], [place] [address] Phone: [phone]  Fax: [phone]   Telemedicine Office Visit:  03/09/2019  Referring physician: Sharyne Peach, MD  I connected with Madison Christensen on 03/10/2019 at 10:30 AM by videoconferencing and verified that I was speaking with the correct person using 2 identifiers.  The patient was at *** home.  I discussed the limitations, risk, security and privacy concerns of performing an evaluation and management service by videoconferencing and the availability of in person appointments.  I also discussed with the patient that there may be a patient responsible charge related to this service.  The patient expressed understanding and agreed to proceed.   Chief Complaint: Madison Christensen is a 68 y.o. female with stage IA Her2/neu + left breast cancer who is seen for 6 month assessment.  HPI: The patient was last seen in the medical oncology clinic on 09/16/2018. At that time, she felt good. Breast exam was stable. She had a rash. CBC and CMP were normal. Direct bilirubin was 0.2. CA 27.29 was 16.7.We re-scheduled her bone density study. She continued to take calcium and vitamin D.   Bilateral mammogram and left breast ultrasound on 10/01/2018 revealed no significant change in focal fat necrosis/oil cyst.    She was seen by her PCP on 10/21/2018 for cellulitis. She had a bump on her right upper back draining fluid. She had no fevers or chills. She received doxycycline 100 mg BID for 10 days.   Bone density on 10/27/2018 revealed osteopenia with a T-score of -1.3 in the AP spine.   During the interim, ***   Past Medical History:  Diagnosis Date  . Breast cancer ([place]) 2016   left  . Cancer ([place])    skin   . Carcinoma of left breast ([place]) 07/30/2014   1.4 cm, T1c, N0, ER negative, PR negative, HER-2 positive  . Chronic kidney disease    h/o renal insuff  . Complication of anesthesia 2004     oversensitive to noise, lights  . COPD (chronic obstructive pulmonary disease) ([place])   . Depression 1995  . GERD (gastroesophageal reflux disease)   . Hyperlipidemia   . Hypertension   . Personal history of chemotherapy   . Personal history of radiation therapy 2016   LEFT lumpectomy    Past Surgical History:  Procedure Laterality Date  . ABDOMINAL HYSTERECTOMY    . AXILLARY LYMPH NODE BIOPSY Left 08/03/2014   Procedure: AXILLARY LYMPH NODE BIOPSY;  Surgeon: Robert Bellow, MD;  Location: [facility] ORS;  Service: General;  Laterality: Left;  . BACK SURGERY    . BREAST BIOPSY Left   . BREAST LUMPECTOMY Left 2016   INVASIVE DUCTAL CARCINOMA & IMC neg margins   . BREAST LUMPECTOMY WITH AXILLARY LYMPH NODE DISSECTION Left 08/03/2014   Procedure: BREAST LUMPECTOMY WITH AXILLARY LYMPH NODE DISSECTION;  Surgeon: Robert Bellow, MD;  Location: [facility] ORS;  Service: General;  Laterality: Left;  . BREAST SURGERY Left Aug 03, 2014   Wide excision, sentinel node biopsy.  . CHOLECYSTECTOMY    . COLONOSCOPY  2013   Dr. Vira Agar  . COLONOSCOPY WITH PROPOFOL N/A 05/30/2015   Procedure: COLONOSCOPY WITH PROPOFOL;  Surgeon: Manya Silvas, MD;  Location: [facility] ENDOSCOPY;  Service: Endoscopy;  Laterality: N/A;  . DILATION AND CURETTAGE OF UTERUS N/A 01/16/2017   Procedure: DILATATION AND CURETTAGE;  Surgeon: Ward, Honor Loh, MD;  Location: [facility] ORS;  Service: Gynecology;  Laterality: N/A;  . LAPAROSCOPIC BILATERAL SALPINGO OOPHERECTOMY Bilateral 02/06/2017   Procedure: LAPAROSCOPIC BILATERAL SALPINGO OOPHORECTOMY;  Surgeon: Ward, Honor Loh, MD;  Location: [facility] ORS;  Service: Gynecology;  Laterality: Bilateral;  . LAPAROSCOPIC HYSTERECTOMY N/A 02/06/2017   Procedure: HYSTERECTOMY TOTAL LAPAROSCOPIC;  Surgeon: Ward, Honor Loh, MD;  Location: [facility] ORS;  Service: Gynecology;  Laterality: N/A;  . OOPHORECTOMY    . PORTACATH PLACEMENT Right 08/31/2014   Procedure: INSERTION PORT-A-CATH;  Surgeon: Robert Bellow, MD;  Location: [facility] ORS;  Service: General;  Laterality: Right;  . REDUCTION MAMMAPLASTY Bilateral 2016  . SENTINEL NODE BIOPSY Left 08/03/2014   Procedure: SENTINEL NODE BIOPSY;  Surgeon: Robert Bellow, MD;  Location: [facility] ORS;  Service: General;  Laterality: Left;  . skin cancer  removal  2015   nose   . TONSILLECTOMY      Family History  Problem Relation Age of Onset  . Breast cancer Mother 25          . Breast cancer Cousin 62            Social History:  reports that she quit smoking about 42 years ago. Her smoking use included cigarettes. She has a 2.00 pack-year smoking history. She has never used smokeless tobacco. She reports current alcohol use. She reports that she does not use drugs. She is retired from [employer]. She lives in [place]. Her husband died 4 years ago. Her sister's name is Arville Go. The patient is ***alone/accompanied by *** today.  Participants in the patient's visit and their role in the encounter included the patient, ***, and Waymon Budge, RN or Vito Berger, CMA, today.  The intake visit was provided by *** Waymon Budge, RN or Vito Berger, [place].   Allergies:  Allergies  Allergen Reactions  . Codeine Other (See Comments)    Severe constipation  . Losartan Other (See Comments)    Sick on stomach  . Lovastatin Other (See Comments)    Per pt headache  . Sulfa Antibiotics Hives and Rash    Current Medications: Current Outpatient Medications  Medication Sig Dispense Refill  . acetaminophen (TYLENOL) 500 MG tablet Take 500 mg by mouth every 6 (six) hours as needed.    Marland Kitchen albuterol (PROVENTIL HFA;VENTOLIN HFA) 108 (90 Base) MCG/ACT inhaler Inhale 2 puffs into the lungs every 6 (six) hours as needed for wheezing or shortness of breath. (Patient not taking: Reported on 09/16/2018) 1 Inhaler 2  . amoxicillin-clavulanate (AUGMENTIN) 875-125 MG tablet Take 1 tablet by mouth 2 (two) times daily. (Patient not taking: Reported on  09/16/2018) 20 tablet 0  . aspirin EC 81 MG tablet Take 81 mg daily by mouth.     . benzonatate (TESSALON) 200 MG capsule TAKE 1 CAPSULE BY MOUTH THREE TIMES DAILY AS NEEDED FOR COUGH FOR UP TO 7 DAYS    . cholecalciferol (VITAMIN D) 1000 units tablet Take 1,000 Units by mouth daily.    . citalopram (CELEXA) 20 MG tablet TAKE 1 TABLET (20 MG TOTAL) BY MOUTH DAILY. (Patient taking differently: Take 30 mg daily by mouth. ) 30 tablet 5  . docusate sodium (COLACE) 100 MG capsule Take 100 mg by mouth 2 (two) times daily.    Marland Kitchen gabapentin (NEURONTIN) 100 MG capsule TAKE 1 CAPSULE BY MOUTH THREE TIMES DAILY (Patient taking differently: Take 100 mg by mouth 2 (two) times daily. ) 90 capsule 1  . methylPREDNISolone (MEDROL DOSEPAK) 4 MG TBPK tablet See admin instructions. see package    .  Multiple Vitamin (MULTIVITAMIN) tablet Take 2 tablets daily by mouth.     . NON FORMULARY Apply 1 application 2 (two) times daily topically. Unscented Free-up Professional Massage Cream Mixed with Engineer, technical sales    . Polyethyl Glycol-Propyl Glycol (SYSTANE ULTRA) 0.4-0.3 % SOLN Place 1 drop as needed into both eyes (for dry eyes).    . pravastatin (PRAVACHOL) 40 MG tablet Take 40 mg at bedtime by mouth.   3  . spironolactone (ALDACTONE) 25 MG tablet Take 25 mg by mouth 2 (two) times daily.      No current facility-administered medications for this visit.   Facility-Administered Medications Ordered in Other Visits  Medication Dose Route Frequency Provider Last Rate Last Admin  . 0.9 %  sodium chloride infusion   Intravenous Continuous Manya Silvas, MD      . diphenhydrAMINE (BENADRYL) capsule 25 mg  25 mg Oral Once Choksi, Delorise Shiner, MD      . sodium chloride 0.9 % injection 10 mL  10 mL Intravenous PRN Forest Gleason, MD   10 mL at 09/25/14 1418  . sodium chloride 0.9 % injection 10 mL  10 mL Intravenous PRN Forest Gleason, MD   10 mL at 01/15/15 1325  . sodium chloride flush (NS) 0.9 % injection 10 mL  10 mL Intravenous PRN  Lequita Asal, MD   10 mL at 07/22/16 1011    Review of Systems  Constitutional: Negative.  Negative for chills, diaphoresis, fever, malaise/fatigue and weight loss (up 3 pound).       "I feel real good".  HENT: Negative for congestion, ear pain, nosebleeds, sinus pain and sore throat.   Eyes: Negative.  Negative for blurred vision, double vision and photophobia.  Respiratory: Positive for shortness of breath (exertional). Negative for cough, hemoptysis and sputum production.   Cardiovascular: Negative.  Negative for chest pain, palpitations, orthopnea, leg swelling and PND.  Gastrointestinal: Negative for abdominal pain, blood in stool, constipation, diarrhea, melena and nausea.       Last colonoscopy 05/30/2015.  Genitourinary: Negative.  Negative for dysuria, frequency, hematuria and urgency.  Musculoskeletal: Positive for back pain (burning sensation). Negative for falls, joint pain, myalgias and neck pain.  Skin: Positive for rash (upper left abdomen). Negative for itching.  Neurological: Positive for tingling (intermittent neuropathy in toes >> fingers). Negative for dizziness, tremors, focal weakness, weakness and headaches.  Endo/Heme/Allergies: Negative.  Does not bruise/bleed easily.  Psychiatric/Behavioral: Negative.  Negative for depression and memory loss. The patient is not nervous/anxious and does not have insomnia.   All other systems reviewed and are negative.   Performance status (ECOG): 1   Physical Exam  Constitutional: She is oriented to person, place, and time. She appears well-developed and well-nourished. No distress.  Woman sitting comfortably in the exam room in no acute distress.  HENT:  Head: Normocephalic and atraumatic.  Mouth/Throat: Oropharynx is clear and moist. No oropharyngeal exudate.  White hair.  Eyes: Pupils are equal, round, and reactive to light. Conjunctivae and EOM are normal. No scleral icterus.  Glasses. Blue eyes.  Cardiovascular:  Normal rate, regular rhythm and normal heart sounds.  No murmur heard. Pulmonary/Chest: Effort normal and breath sounds normal. No respiratory distress. Left breast exhibits tenderness.  Left breast with significant post -operative scarring.  Abdominal: Soft. Bowel sounds are normal. There is no abdominal tenderness.  Musculoskeletal:        General: No tenderness or edema. Normal range of motion.     Cervical back: Normal range of  motion and neck supple.  Lymphadenopathy:    She has no cervical adenopathy.  Neurological: She is alert and oriented to person, place, and time. She has normal reflexes.  Skin: Skin is warm and dry. Rash (left upper abdomen) noted. She is not diaphoretic.  Keloid right upper chest.  Psychiatric: She has a normal mood and affect. Her behavior is normal. Judgment and thought content normal.  Nursing note and vitals reviewed.  Imaging studies: 07/18/2015:  Bilateral mammogramrevealed no evidence of malignancy.  07/22/2016:  Bilateral mammogramwas benign. There were scattered areas of fibroglandular density. Post surgical changes were noted in the left breast. 12/01/2016:  Diagnostic right mammogram and ultrasoundrevealed normal appearing breast tissue and postreduction scar in the area of clinical concern in the lower outer right breast. No mass was seen.  08/19/2017:  Bilateral mammogramrevealed mild evolving fat necrosis with dystrophic calcifications in the lower outer aspect of the RIGHT breast. There was no mammographic evidence of malignancy in either breast.   03/26/2018:  Left breast mammogram and ultrasound revealed probable postoperative changes in the left breast.  There was a 1.7 x 0.8 x 1.3 cm complex cystic mass at 12 o'clock 2 cm from the nipple felt secondary to postoperative hematoma/seroma.  Recommendation was bilateral diagnostic mammogram and left breast ultrasound in 6 months.  No visits with results within 3 Day(s) from this visit.    Latest known visit with results is:  Infusion on 09/16/2018  Component Date Value Ref Range Status  . Bilirubin, Direct 09/16/2018 0.2  0.0 - 0.2 mg/dL Final   Performed at [facility], [address] [place], [place] [address]  . CA 27.29 09/16/2018 16.7  0.0 - 38.6 U/mL Final   Comment: (NOTE) Siemens Centaur Immunochemiluminometric Methodology [facility]) Values obtained with different assay methods or kits cannot be used interchangeably. Results cannot be interpreted as absolute evidence of the presence or absence of malignant disease. Performed At: [facility] [place], [place] [id number] Rush Farmer MD EX:[id number]   . Sodium 09/16/2018 137  135 - 145 mmol/L Final  . Potassium 09/16/2018 3.5  3.5 - 5.1 mmol/L Final  . Chloride 09/16/2018 103  98 - 111 mmol/L Final  . CO2 09/16/2018 24  22 - 32 mmol/L Final  . Glucose, Bld 09/16/2018 106* 70 - 99 mg/dL Final  . BUN 09/16/2018 15  8 - 23 mg/dL Final  . Creatinine, Ser 09/16/2018 0.90  0.44 - 1.00 mg/dL Final  . Calcium 09/16/2018 8.9  8.9 - 10.3 mg/dL Final  . Total Protein 09/16/2018 7.3  6.5 - 8.1 g/dL Final  . Albumin 09/16/2018 4.1  3.5 - 5.0 g/dL Final  . AST 09/16/2018 26  15 - 41 U/L Final  . ALT 09/16/2018 25  0 - 44 U/L Final  . Alkaline Phosphatase 09/16/2018 48  38 - 126 U/L Final  . Total Bilirubin 09/16/2018 1.1  0.3 - 1.2 mg/dL Final  . GFR calc non Af Amer 09/16/2018 >60  >60 mL/min Final  . GFR calc Af Amer 09/16/2018 >60  >60 mL/min Final  . Anion gap 09/16/2018 10  5 - 15 Final   Performed at [facility], [address], [place], [place] [address]  . WBC 09/16/2018 6.1  4.0 - 10.5 K/uL Final  . RBC 09/16/2018 4.80  3.87 - 5.11 MIL/uL Final  . Hemoglobin 09/16/2018 14.6  12.0 - 15.0 g/dL Final  . HCT 09/16/2018 43.5  36.0 - 46.0 % Final  .  MCV 09/16/2018 90.6  80.0 - 100.0 fL Final  . MCH 09/16/2018 30.4  26.0 - 34.0 pg Final  . MCHC 09/16/2018 33.6  30.0  - 36.0 g/dL Final  . RDW 09/16/2018 12.8  11.5 - 15.5 % Final  . Platelets 09/16/2018 172  150 - 400 K/uL Final  . nRBC 09/16/2018 0.0  0.0 - 0.2 % Final  . Neutrophils Relative % 09/16/2018 57  % Final  . Neutro Abs 09/16/2018 3.5  1.7 - 7.7 K/uL Final  . Lymphocytes Relative 09/16/2018 32  % Final  . Lymphs Abs 09/16/2018 2.0  0.7 - 4.0 K/uL Final  . Monocytes Relative 09/16/2018 8  % Final  . Monocytes Absolute 09/16/2018 0.5  0.1 - 1.0 K/uL Final  . Eosinophils Relative 09/16/2018 2  % Final  . Eosinophils Absolute 09/16/2018 0.1  0.0 - 0.5 K/uL Final  . Basophils Relative 09/16/2018 1  % Final  . Basophils Absolute 09/16/2018 0.1  0.0 - 0.1 K/uL Final  . Immature Granulocytes 09/16/2018 0  % Final  . Abs Immature Granulocytes 09/16/2018 0.01  0.00 - 0.07 K/uL Final   Performed at [facility], [address] [place], [place] [address]    Assessment:  SAMAIRA HOLZWORTH is a 68 y.o. female with stage IA Her2/neu + left breast cancerstatus wide excision on 08/03/2014. 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 Lymphvascular invasion was indeterminate (+ on original biopsy). DCIS was present. Three sentinel lymph nodes were negative. Tumor was ER -, PR -, and Her2/neu 3+ by IHC. Pathologic stage was T1cN0M0.  She received adjuvant TaxolandHerceptin. She received 10 of 12 weeks of Taxol(09/04/2014 - 11/06/2014). 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 She received Herceptinx 6 months (09/04/2014 - 03/27/2015). Herceptin was discontinued secondary to myalgias.  She completed radiationtherapy in 02/2015.  Left breast mammogram on 03/26/2018 revealed probable postoperative changes in the left breast.  Ultrasound revealed a 1.7 x 0.8 x 1.3 cm complex cystic mass at 12 o'clock 2 cm from the nipple felt secondary to postoperative hematoma/seroma.  Recommendation was bilateral diagnostic mammogram and left breast  ultrasound in 6 months.  CA27.29has been followed: 16.3 on 03/11/2016, 9.8 on 07/22/2016, 18.6 on 11/24/2016, 15.5 on 03/26/2017, 13.2 on 09/03/2017, 12.0 on 01/14/2018, and 14.4 on 03/25/2018.  Bone density on 05/12/2016 revealed osteopeniawith a T score of -1.1 in the right femur and -0.4 in the AP spine L1-L2.  Bone density on 10/27/2018 revealed osteopenia with a T-score of -1.3 in the AP spine L1-L2.   She underwent total laparoscopic hysterectomy and bilateral oophorectomyon 02/06/2017 for pelvic pain, adnexal mass, and postmenopausal bleeding. Pathologyrevealed no evidence of atypia or malignancy.  She has a history on an elevated bilirubin(indirect). 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  Symptomatically, ***  Plan: 1.   Review labs from 03/10/2019.  2. Stage IA Her2/neu + left breast cancer Clinically, she is doing well.               Exam reveals no evidence of recurrent disease.               Left breast mammogram on 03/26/2018 revealed probable postoperative changes.             Follow-up bilateral diagnostic mammogram and left breast ultrasound on 10/01/2018. 3. Osteopenia Continue calcium and vitamin D.               Re-schedule bone density study. 4.   Rash  Follow-up with Dr Iona Beard if does not resolve. 5.   Port flushesevery 8-12 weeksuntil removal. 6.   RTC in 6 months for MD assessment and labs (CBC with diff, CMP, CA27.29).   I discussed the assessment and treatment plan with the patient.  The patient was provided an opportunity to ask questions and all were answered.  The patient agreed with the plan and demonstrated an understanding of the instructions.  The patient was advised to call back if the symptoms worsen or if the condition fails to improve as anticipated.  I provided *** minutes (10:30 AM - x:xx) of face-to-face video visit time during this this encounter and > 50% was spent counseling as documented under my  assessment and plan.  I provided these services from the [facility] office ***.    Lequita Asal, MD, PhD    03/09/2019, 10:30 AM  I, Selena Batten, am acting as scribe for [employer]. Mike Gip, MD, PhD.  {Add scribe attestation statement}

## 2019-03-10 ENCOUNTER — Other Ambulatory Visit: Payer: Self-pay

## 2019-03-10 ENCOUNTER — Ambulatory Visit: Payer: Medicare Other | Admitting: Hematology and Oncology

## 2019-03-10 ENCOUNTER — Inpatient Hospital Stay: Payer: Medicare Other | Attending: Hematology and Oncology

## 2019-03-10 ENCOUNTER — Other Ambulatory Visit: Payer: Medicare Other

## 2019-03-10 VITALS — Temp 97.1°F | Resp 16

## 2019-03-10 DIAGNOSIS — Z171 Estrogen receptor negative status [ER-]: Secondary | ICD-10-CM | POA: Diagnosis not present

## 2019-03-10 DIAGNOSIS — Z95828 Presence of other vascular implants and grafts: Secondary | ICD-10-CM

## 2019-03-10 DIAGNOSIS — C50212 Malignant neoplasm of upper-inner quadrant of left female breast: Secondary | ICD-10-CM

## 2019-03-10 LAB — COMPREHENSIVE METABOLIC PANEL
ALT: 22 U/L (ref 0–44)
AST: 22 U/L (ref 15–41)
Albumin: 4.3 g/dL (ref 3.5–5.0)
Alkaline Phosphatase: 47 U/L (ref 38–126)
Anion gap: 8 (ref 5–15)
BUN: 12 mg/dL (ref 8–23)
CO2: 25 mmol/L (ref 22–32)
Calcium: 9.2 mg/dL (ref 8.9–10.3)
Chloride: 106 mmol/L (ref 98–111)
Creatinine, Ser: 0.86 mg/dL (ref 0.44–1.00)
GFR calc Af Amer: >60 mL/min (ref >60)
GFR calc non Af Amer: >60 mL/min (ref >60)
Glucose, Bld: 90 mg/dL (ref 70–99)
Potassium: 3.7 mmol/L (ref 3.5–5.1)
Sodium: 139 mmol/L (ref 135–145)
Total Bilirubin: 1.3 mg/dL — ABNORMAL HIGH (ref 0.3–1.2)
Total Protein: 7.4 g/dL (ref 6.5–8.1)

## 2019-03-10 LAB — CBC WITH DIFFERENTIAL/PLATELET
Abs Immature Granulocytes: 0.01 10*3/uL (ref 0.00–0.07)
Basophils Absolute: 0.1 10*3/uL (ref 0.0–0.1)
Basophils Relative: 1 %
Eosinophils Absolute: 0.1 10*3/uL (ref 0.0–0.5)
Eosinophils Relative: 2 %
HCT: 44 % (ref 36.0–46.0)
Hemoglobin: 14.8 g/dL (ref 12.0–15.0)
Immature Granulocytes: 0 %
Lymphocytes Relative: 32 %
Lymphs Abs: 2 10*3/uL (ref 0.7–4.0)
MCH: 29.9 pg (ref 26.0–34.0)
MCHC: 33.6 g/dL (ref 30.0–36.0)
MCV: 88.9 fL (ref 80.0–100.0)
Monocytes Absolute: 0.4 10*3/uL (ref 0.1–1.0)
Monocytes Relative: 7 %
Neutro Abs: 3.7 10*3/uL (ref 1.7–7.7)
Neutrophils Relative %: 58 %
Platelets: 182 10*3/uL (ref 150–400)
RBC: 4.95 MIL/uL (ref 3.87–5.11)
RDW: 12.7 % (ref 11.5–15.5)
WBC: 6.3 10*3/uL (ref 4.0–10.5)
nRBC: 0 % (ref 0.0–0.2)

## 2019-03-10 MED ORDER — HEPARIN SOD (PORK) LOCK FLUSH 100 UNIT/ML IV SOLN
500.0000 [IU] | Freq: Once | INTRAVENOUS | Status: AC
  Administered 2019-03-10: 500 [IU] via INTRAVENOUS
  Filled 2019-03-10: qty 5

## 2019-03-10 MED ORDER — SODIUM CHLORIDE 0.9% FLUSH
10.0000 mL | INTRAVENOUS | Status: DC | PRN
  Administered 2019-03-10: 10 mL via INTRAVENOUS
  Filled 2019-03-10: qty 10

## 2019-03-11 LAB — CANCER ANTIGEN 27.29: CA 27.29: 15 U/mL (ref 0.0–38.6)

## 2019-03-15 ENCOUNTER — Encounter: Payer: Self-pay | Admitting: Hematology and Oncology

## 2019-03-15 NOTE — Progress Notes (Signed)
[facility]  [address] [place], [place] [address] Phone: [phone]  Fax: [phone]   [place] Office Visit:  03/16/2019  Referring physician: Sharyne Peach, MD  I connected with Madison Christensen on 03/16/19 at 11:58 AM by videoconferencing and verified that I was speaking with the correct person using 2 identifiers.  The patient was at home.  I discussed the limitations, risk, security and privacy concerns of performing an evaluation and management service by videoconferencing and the availability of in person appointments.  I also discussed with the patient that there may be a patient responsible charge related to this service.  The patient expressed understanding and agreed to proceed.   Chief Complaint: Madison Christensen is a 69 y.o. female with stage IA Her2/neu + left breast cancer who is seen for 6 month assessment.  HPI: The patient was last seen in the medical oncology clinic on 09/16/2018. At that time, she felt good.  Breast exam was stable.  CA 27.29 was normal.  Bilateral breast mammogram with unilateral left ultrasound on 10/01/2018 showed no significant change in the patient's focal fat necrosis/oil cyst.  Bone density on 10/27/2018 showed osteopenia with a T-score of -1.3 of L1-L2 spine and normal right dual femur neck with a T-score of -1.0.  Labs on 03/10/2019 revealed a Hematocrit 44.0, hemoglobin 148, MCV 88.9, platelets 182,000, WBC 6300, ANC 3700. CA27.29 was 15.0.  Port was flushed.   During the interim, she has done well.  She does regular breast exams and voices no concerns.  She is taking vitamin D daily but is unsure if she is taking calcium.  Her rash cleared up with a cream provided by Dr. Iona Beard.  She will be seeing Dr. Phillip Heal in dermatology in 05/2019. Her shortness of breath has gotten better. She notes improvement as she gets out and walks during the day.    She has stopped going to church due to [place]. A lady  she was close friends with was diagnosed with COVID on Sunday.  She was around her Saturday to deliver her groceries. She was wearing a mask. She doesn't note any symptoms.   Past Medical History:  Diagnosis Date  . Breast cancer ([place]) 2016   left  . Cancer ([place])    skin   . Carcinoma of left breast ([place]) 07/30/2014   1.4 cm, T1c, N0, ER negative, PR negative, HER-2 positive  . Chronic kidney disease    h/o renal insuff  . Complication of anesthesia 2004   oversensitive to noise, lights  . COPD (chronic obstructive pulmonary disease) ([place])   . Depression 1995  . GERD (gastroesophageal reflux disease)   . Hyperlipidemia   . Hypertension   . Personal history of chemotherapy   . Personal history of radiation therapy 2016   LEFT lumpectomy    Past Surgical History:  Procedure Laterality Date  . ABDOMINAL HYSTERECTOMY    . AXILLARY LYMPH NODE BIOPSY Left 08/03/2014   Procedure: AXILLARY LYMPH NODE BIOPSY;  Surgeon: Robert Bellow, MD;  Location: [facility] ORS;  Service: General;  Laterality: Left;  . BACK SURGERY    . BREAST BIOPSY Left   . BREAST LUMPECTOMY Left 2016   INVASIVE DUCTAL CARCINOMA & IMC neg margins   . BREAST LUMPECTOMY WITH AXILLARY LYMPH NODE DISSECTION Left 08/03/2014   Procedure: BREAST LUMPECTOMY WITH AXILLARY LYMPH NODE DISSECTION;  Surgeon: Robert Bellow, MD;  Location: [facility] OR[facility];  Service: General;  Laterality: Left;  .  BREAST SURGERY Left Aug 03, 2014   Wide excision, sentinel node biopsy.  . CHOLECYSTECTOMY    . COLONOSCOPY  2013   Dr. Vira Agar  . COLONOSCOPY WITH PROPOFOL N/A 05/30/2015   Procedure: COLONOSCOPY WITH PROPOFOL;  Surgeon: Manya Silvas, MD;  Location: [facility];  Service: Endoscopy;  Laterality: N/A;  . DILATION AND CURETTAGE OF UTERUS N/A 01/16/2017   Procedure: DILATATION AND CURETTAGE;  Surgeon: Ward, Honor Loh, MD;  Location: [facility] ORS;  Service: Gynecology;  Laterality: N/A;  . LAPAROSCOPIC BILATERAL SALPINGO OOPHERECTOMY  Bilateral 02/06/2017   Procedure: LAPAROSCOPIC BILATERAL SALPINGO OOPHORECTOMY;  Surgeon: Ward, Honor Loh, MD;  Location: [facility] ORS;  Service: Gynecology;  Laterality: Bilateral;  . LAPAROSCOPIC HYSTERECTOMY N/A 02/06/2017   Procedure: HYSTERECTOMY TOTAL LAPAROSCOPIC;  Surgeon: Ward, Honor Loh, MD;  Location: [facility] OR[facility];  Service: Gynecology;  Laterality: N/A;  . OOPHORECTOMY    . PORTACATH PLACEMENT Right 08/31/2014   Procedure: INSERTION PORT-A-CATH;  Surgeon: Robert Bellow, MD;  Location: [facility] ORS;  Service: General;  Laterality: Right;  . REDUCTION MAMMAPLASTY Bilateral 2016  . SENTINEL NODE BIOPSY Left 08/03/2014   Procedure: SENTINEL NODE BIOPSY;  Surgeon: Robert Bellow, MD;  Location: [facility] ORS;  Service: General;  Laterality: Left;  . skin cancer  removal  2015   nose   . TONSILLECTOMY      Family History  Problem Relation Age of Onset  . Breast cancer Mother 43          . Breast cancer Cousin 62            Social History:  reports that she quit smoking about 42 years ago. Her smoking use included cigarettes. She has a 2.00 pack-year smoking history. She has never used smokeless tobacco. She reports current alcohol use. She reports that she does not use drugs. She is retired from [employer]. She lives in [place]. Her husband died 4 years ago. Her sister's name is Arville Go. She has a pitbull/greyhound mix named Chance. The patient is accompanied by her puppy today.  Participants in the patient's visit and their role in the encounter included the patient, Samul Dada, scribe, and [employer], CMA, today.  The intake visit was provided by Samul Dada, scribe, and Vito Berger, CMA.   Discussed avoiding crowds, wearing a face mask while in public, and practicing stringent handwashing.  Allergies:  Allergies  Allergen Reactions  . Codeine Other (See Comments)    Severe constipation  . Losartan Other (See Comments)    Sick on stomach  . Lovastatin Other (See  Comments)    Per pt headache  . Sulfa Antibiotics Hives and Rash    Current Medications: Current Outpatient Medications  Medication Sig Dispense Refill  . albuterol (PROVENTIL HFA;VENTOLIN HFA) 108 (90 Base) MCG/ACT inhaler Inhale 2 puffs into the lungs every 6 (six) hours as needed for wheezing or shortness of breath. 1 Inhaler 2  . aspirin EC 81 MG tablet Take 81 mg daily by mouth.     . cholecalciferol (VITAMIN D) 1000 units tablet Take 1,000 Units by mouth daily.    . citalopram (CELEXA) 20 MG tablet TAKE 1 TABLET (20 MG TOTAL) BY MOUTH DAILY. (Patient taking differently: Take 30 mg daily by mouth. ) 30 tablet 5  . docusate sodium (COLACE) 100 MG capsule Take 100 mg by mouth 2 (two) times daily.    . fluticasone (FLOVENT HFA) 44 MCG/ACT inhaler Inhale into the lungs.    . gabapentin (NEURONTIN) 100  MG capsule TAKE 1 CAPSULE BY MOUTH THREE TIMES DAILY (Patient taking differently: Take 100 mg by mouth 2 (two) times daily. ) 90 capsule 1  . Multiple Vitamin (MULTIVITAMIN) tablet Take 2 tablets daily by mouth.     . NON FORMULARY Apply 1 application 2 (two) times daily topically. Unscented Free-up Professional Massage Cream Mixed with Lavendar    . Polyethyl Glycol-Propyl Glycol (SYSTANE ULTRA) 0.4-0.3 % SOLN Place 1 drop as needed into both eyes (for dry eyes).    . pravastatin (PRAVACHOL) 40 MG tablet Take 40 mg at bedtime by mouth.   3  . spironolactone (ALDACTONE) 25 MG tablet Take 25 mg by mouth 2 (two) times daily.     . acetaminophen (TYLENOL) 500 MG tablet Take 500 mg by mouth every 6 (six) hours as needed.    . amoxicillin-clavulanate (AUGMENTIN) 875-125 MG tablet Take 1 tablet by mouth 2 (two) times daily. (Patient not taking: Reported on 09/16/2018) 20 tablet 0  . benzonatate (TESSALON) 200 MG capsule TAKE 1 CAPSULE BY MOUTH THREE TIMES DAILY AS NEEDED FOR COUGH FOR UP TO 7 DAYS    . methylPREDNISolone (MEDROL DOSEPAK) 4 MG TBPK tablet See admin instructions. see package     No  current facility-administered medications for this visit.   Facility-Administered Medications Ordered in Other Visits  Medication Dose Route Frequency Provider Last Rate Last Admin  . 0.9 %  sodium chloride infusion   Intravenous Continuous Elliott, Robert T, MD      . diphenhydrAMINE (BENADRYL) capsule 25 mg  25 mg Oral Once Choksi, Janak, MD      . sodium chloride 0.9 % injection 10 mL  10 mL Intravenous PRN Choksi, Janak, MD   10 mL at 09/25/14 1418  . sodium chloride 0.9 % injection 10 mL  10 mL Intravenous PRN Choksi, Janak, MD   10 mL at 01/15/15 1325  . sodium chloride flush (NS) 0.9 % injection 10 mL  10 mL Intravenous PRN Corcoran, Melissa C, MD   10 mL at 07/22/16 1011    Review of Systems  Constitutional: Negative.  Negative for chills, diaphoresis, fever, malaise/fatigue and weight loss (no new weight).       Feels "good".  HENT: Negative.  Negative for congestion, ear pain, nosebleeds, sinus pain and sore throat.   Eyes: Negative.  Negative for blurred vision, double vision and photophobia.  Respiratory: Positive for shortness of breath (exertional- improved). Negative for cough, hemoptysis and sputum production.   Cardiovascular: Negative.  Negative for chest pain, palpitations, orthopnea, leg swelling and PND.  Gastrointestinal: Negative for abdominal pain, blood in stool, constipation, diarrhea, melena, nausea and vomiting.       Last colonoscopy 05/30/2015.  Genitourinary: Negative.  Negative for dysuria, frequency, hematuria and urgency.  Musculoskeletal: Positive for back pain (acts up at times). Negative for falls, joint pain, myalgias and neck pain.  Skin: Negative for itching and rash (improved with topicsl cream).  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 Negative for dizziness, tremors, focal weakness, seizures, weakness and headaches.  Endo/Heme/Allergies: Negative.  Does not bruise/bleed easily.   Psychiatric/Behavioral: Negative.  Negative for depression and memory loss. The patient is not nervous/anxious and does not have insomnia.   All other systems reviewed and are negative.  Performance status (ECOG):  1  Vitals There were no vitals taken for this visit.   Physical Exam  Constitutional: She is oriented to person, place, and time. She appears well-developed and well-nourished.   No distress.  HENT:  Head: Normocephalic and atraumatic.  Gray/white hair.  Eyes: Conjunctivae and EOM are normal. No scleral icterus.  Glasses.  Blue eyes.  Neurological: She is alert and oriented to person, place, and time.  Skin: She is not diaphoretic.  Psychiatric: She has a normal mood and affect. Her behavior is normal. Judgment and thought content normal.  Nursing note reviewed.   No visits with results within 3 Day(s) from this visit.  Latest known visit with results is:  Infusion on 03/10/2019  Component Date Value Ref Range Status  . Sodium 03/10/2019 139  135 - 145 mmol/L Final  . Potassium 03/10/2019 3.7  3.5 - 5.1 mmol/L Final  . Chloride 03/10/2019 106  98 - 111 mmol/L Final  . CO2 03/10/2019 25  22 - 32 mmol/L Final  . Glucose, Bld 03/10/2019 90  70 - 99 mg/dL Final  . BUN 03/10/2019 12  8 - 23 mg/dL Final  . Creatinine, Ser 03/10/2019 0.86  0.44 - 1.00 mg/dL Final  . Calcium 03/10/2019 9.2  8.9 - 10.3 mg/dL Final  . Total Protein 03/10/2019 7.4  6.5 - 8.1 g/dL Final  . Albumin 03/10/2019 4.3  3.5 - 5.0 g/dL Final  . AST 03/10/2019 22  15 - 41 U/L Final  . ALT 03/10/2019 22  0 - 44 U/L Final  . Alkaline Phosphatase 03/10/2019 47  38 - 126 U/L Final  . Total Bilirubin 03/10/2019 1.3* 0.3 - 1.2 mg/dL Final  . GFR calc non Af Amer 03/10/2019 >60  >60 mL/min Final  . GFR calc Af Amer 03/10/2019 >60  >60 mL/min Final  . Anion gap 03/10/2019 8  5 - 15 Final   Performed at [facility], [address] [place], [place] [address]  . CA 27.29 03/10/2019 15.0  0.0 -  38.6 U/mL Final   Comment: (NOTE) Siemens Centaur Immunochemiluminometric Methodology (ICMA) Values obtained with different assay methods or kits cannot be used interchangeably. Results cannot be interpreted as absolute evidence of the presence or absence of malignant disease. Performed At: [facility] [address] [place], [place] [id number] Nagendra Sanjai MD Ph:[phone]   . WBC 03/10/2019 6.3  4.0 - 10.5 K/uL Final  . RBC 03/10/2019 4.95  3.87 - 5.11 MIL/uL Final  . Hemoglobin 03/10/2019 14.8  12.0 - 15.0 g/dL Final  . HCT 03/10/2019 44.0  36.0 - 46.0 % Final  . MCV 03/10/2019 88.9  80.0 - 100.0 fL Final  . MCH 03/10/2019 29.9  26.0 - 34.0 pg Final  . MCHC 03/10/2019 33.6  30.0 - 36.0 g/dL Final  . RDW 03/10/2019 12.7  11.5 - 15.5 % Final  . Platelets 03/10/2019 182  150 - 400 K/uL Final  . nRBC 03/10/2019 0.0  0.0 - 0.2 % Final  . Neutrophils Relative % 03/10/2019 58  % Final  . Neutro Abs 03/10/2019 3.7  1.7 - 7.7 K/uL Final  . Lymphocytes Relative 03/10/2019 32  % Final  . Lymphs Abs 03/10/2019 2.0  0.7 - 4.0 K/uL Final  . Monocytes Relative 03/10/2019 7  % Final  . Monocytes Absolute 03/10/2019 0.4  0.1 - 1.0 K/uL Final  . Eosinophils Relative 03/10/2019 2  % Final  . Eosinophils Absolute 03/10/2019 0.1  0.0 - 0.5 K/uL Final  . Basophils Relative 03/10/2019 1  % Final  . Basophils Absolute 03/10/2019 0.1  0.0 - 0.1 K/uL Final  . Immature Granulocytes 03/10/2019 0  % Final  . Abs Immature   Granulocytes 03/10/2019 0.01  0.00 - 0.07 K/uL Final   Performed at [facility], [address], [place], [place] [address]    Assessment:  JAZLENE BARES is a 69 y.o. female with stage IA Her2/neu + left breast cancerstatus wide excision on 08/03/2014. 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 Lymphvascular invasion was indeterminate (+ on original biopsy). DCIS was present. Three sentinel lymph nodes were negative. Tumor was ER -,  PR -, and Her2/neu 3+ by IHC. Pathologic stage was [id number].  She received adjuvant TaxolandHerceptin. She received 10 of 12 weeks of Taxol(09/04/2014 - 11/06/2014). 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 She received Herceptinx 6 months (09/04/2014 - 03/27/2015). Herceptin was discontinued secondary to myalgias.  She completed radiationtherapy in 02/2015.  Left mammogram on 03/26/2018 revealed probable postoperative changes in the left breast.  Ultrasound revealed a 1.7 x 0.8 x 1.3 cm complex cystic mass at 12 o'clock 2 cm from the nipple felt secondary to postoperative hematoma/seroma.  Bilateral mammogram with unilateral left ultrasound on 10/01/2018 showed no significant change in the patient's focal fat necrosis/oil cyst.  CA27.29has been followed: 16.3 on 03/11/2016, 9.8 on 07/22/2016, 18.6 on 11/24/2016, 15.5 on 03/26/2017, 13.2 on 09/03/2017, 12.0 on 01/14/2018, and 14.4 on 03/25/2018.  Bone density on 05/12/2016 revealed osteopeniawith a T score of -1.1 in the right femur and -0.4 in the AP spine L1-L2.  Bone density on 10/27/2018 showed osteopenia with a T-score of -1.3 of L1-L2 spine and normal right dual femur neck with a T-score of -1.0.  She underwent total laparoscopic hysterectomy and bilateral oophorectomyon 02/06/2017 for pelvic pain, adnexal mass, and postmenopausal bleeding. Pathologyrevealed no evidence of atypia or malignancy.  She has a history on an elevated bilirubin(indirect). 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  Symptomatically, she is doing well.  She denies any breast concerns.  Plan: 1.    Review labs from 03/10/2019. 2. Stage IA Her2/neu + left breast cancer Clinically, he continues to do well.  She notes no concerns with monthly self breast exams.               Bilateral mammogram with unilateral left ultrasound on 10/01/2018 showed no significant change in the focal fat necrosis/oil cyst.  Schedule  bilateral diagnostic mammogram on 10/03/2019. 3. Osteopenia Continue calcium and vitamin D.               Bone density on 10/27/2018 revealed osteopenia with a T-score of -1.3 of L1-L2 spine. 4.   Port-a-cath  Discuss port-a-cath flush every 12 weeks until removal.  Patient interested in keeping her port for 5 years. 5.   RTC in 6 months for MD assessment, labs (CBC with diff, CMP, CA 27.29).  I discussed the assessment and treatment plan with the patient.  The patient was provided an opportunity to ask questions and all were answered.  The patient agreed with the plan and demonstrated an understanding of the instructions.  The patient was advised to call back if the symptoms worsen or if the condition fails to improve as anticipated.  I provided 12 minutes (11:46 AM - 11:58 AM) of face-to-face time during this this encounter and > 50% was spent counseling as documented under my assessment and plan.    Lequita Asal, MD, PhD    03/16/2019, 11:58 AM  I, Samul Dada, am acting as a scribe for Lequita Asal, MD.  I, Woodlynne Mike Gip, MD, have reviewed the above documentation for accuracy and  completeness, and I agree with the above.

## 2019-03-15 NOTE — Progress Notes (Signed)
No new changes noted today. The patient Name and DOB has been verified by phone today. 

## 2019-03-16 ENCOUNTER — Inpatient Hospital Stay: Payer: Medicare Other | Attending: Hematology and Oncology | Admitting: Hematology and Oncology

## 2019-03-16 ENCOUNTER — Encounter: Payer: Self-pay | Admitting: Hematology and Oncology

## 2019-03-16 DIAGNOSIS — C50912 Malignant neoplasm of unspecified site of left female breast: Secondary | ICD-10-CM

## 2019-03-16 DIAGNOSIS — Z171 Estrogen receptor negative status [ER-]: Secondary | ICD-10-CM

## 2019-03-16 DIAGNOSIS — Z95828 Presence of other vascular implants and grafts: Secondary | ICD-10-CM

## 2019-03-16 DIAGNOSIS — M85851 Other specified disorders of bone density and structure, right thigh: Secondary | ICD-10-CM | POA: Diagnosis not present

## 2019-04-09 DIAGNOSIS — Z95828 Presence of other vascular implants and grafts: Secondary | ICD-10-CM | POA: Insufficient documentation

## 2019-06-08 ENCOUNTER — Other Ambulatory Visit: Payer: Self-pay

## 2019-06-08 ENCOUNTER — Inpatient Hospital Stay: Payer: Medicare HMO | Attending: Hematology and Oncology

## 2019-06-08 DIAGNOSIS — C50812 Malignant neoplasm of overlapping sites of left female breast: Secondary | ICD-10-CM | POA: Insufficient documentation

## 2019-06-08 DIAGNOSIS — Z95828 Presence of other vascular implants and grafts: Secondary | ICD-10-CM

## 2019-06-08 DIAGNOSIS — M8580 Other specified disorders of bone density and structure, unspecified site: Secondary | ICD-10-CM | POA: Insufficient documentation

## 2019-06-08 MED ORDER — SODIUM CHLORIDE 0.9% FLUSH
10.0000 mL | INTRAVENOUS | Status: DC | PRN
  Administered 2019-06-08: 10 mL via INTRAVENOUS
  Filled 2019-06-08: qty 10

## 2019-06-08 MED ORDER — HEPARIN SOD (PORK) LOCK FLUSH 100 UNIT/ML IV SOLN
500.0000 [IU] | Freq: Once | INTRAVENOUS | Status: AC
  Administered 2019-06-08: 500 [IU] via INTRAVENOUS
  Filled 2019-06-08: qty 5

## 2019-08-31 ENCOUNTER — Inpatient Hospital Stay: Payer: Medicare HMO

## 2019-10-03 ENCOUNTER — Ambulatory Visit
Admission: RE | Admit: 2019-10-03 | Discharge: 2019-10-03 | Disposition: A | Discharge status: Home / Self Care | Payer: Medicare HMO | Source: Ambulatory Visit | Attending: Hematology and Oncology | Admitting: Hematology and Oncology

## 2019-10-03 DIAGNOSIS — Z08 Encounter for follow-up examination after completed treatment for malignant neoplasm: Secondary | ICD-10-CM | POA: Insufficient documentation

## 2019-10-03 DIAGNOSIS — Z171 Estrogen receptor negative status [ER-]: Secondary | ICD-10-CM | POA: Diagnosis not present

## 2019-10-03 DIAGNOSIS — C50912 Malignant neoplasm of unspecified site of left female breast: Secondary | ICD-10-CM

## 2019-10-03 DIAGNOSIS — Z853 Personal history of malignant neoplasm of breast: Secondary | ICD-10-CM | POA: Diagnosis not present

## 2019-10-05 ENCOUNTER — Other Ambulatory Visit: Payer: Self-pay

## 2019-10-05 ENCOUNTER — Encounter: Payer: Self-pay | Admitting: Hematology and Oncology

## 2019-10-05 NOTE — Progress Notes (Signed)
No new changes noted today. The patient Name and DOB has been verified by phone. 

## 2019-10-05 NOTE — Progress Notes (Signed)
[facility]  [address] [place], [place] [address] Phone: [phone]  Fax: [phone]   Clinic Day:  10/06/2019  Referring physician: Sharyne Peach, MD  Chief Complaint: Madison Christensen is a 69 y.o. female with stage IA Her2/neu + left breast cancer who is seen for 6 month assessment.  HPI: The patient was last seen in the medical oncology clinic on 03/16/2019 via telemedicine. At that time, she was doing well. She denied any breast concerns. Hematocrit was 44.0, hemoglobin 14.8, platelets 182,000, WBC 6,300. Total bilirubin was 1.3. CA27.29 was 15.0.  The patient saw Dr. Duanne Moron on 05/23/2019 for persistent right ear pain. She was given Levaquin, guaifenesin, prednisone, and ciprodex for acute malignant otitis externa.   Diagnostic mammogram on 10/03/2019 revealed no evidence of malignancy in either breast.  Labs on 05/19/2019 showed a hematocrit 45.7, hemoglobin 15.0, platelets 182,000, WBC 7,200. Creatinine 1.1.  During the interim, she has been fine. For the past couple of months, she sometimes wakes up with nausea but it goes away throughout the day. She sometimes gets dizzy for a few seconds. Her shortness of breath is stable and she uses an inhaler. Her neuropathy is stable.  She denies any recent falls, headaches, change in vision, numbness, weakness, or balance problem. She takes vitamin D but does not take calcium. The patient is thinking about getting her port-a-cath removed. She performs monthly breast self-exams and reports that her breast is not as tender as it used to be.   She does not plan to receive the COVID-19 vaccine at this time.   Past Medical History:  Diagnosis Date  . Breast cancer ([place]) 2016   left  . Cancer ([place])    skin   . Carcinoma of left breast ([place]) 07/30/2014   1.4 cm, T1c, N0, ER negative, PR negative, HER-2 positive  . Chronic kidney disease    h/o renal insuff  . Complication of anesthesia 2004    oversensitive to noise, lights  . COPD (chronic obstructive pulmonary disease) ([place])   . Depression 1995  . GERD (gastroesophageal reflux disease)   . Hyperlipidemia   . Hypertension   . Personal history of chemotherapy   . Personal history of radiation therapy 2016   LEFT lumpectomy    Past Surgical History:  Procedure Laterality Date  . ABDOMINAL HYSTERECTOMY    . AXILLARY LYMPH NODE BIOPSY Left 08/03/2014   Procedure: AXILLARY LYMPH NODE BIOPSY;  Surgeon: Robert Bellow, MD;  Location: [facility] ORS;  Service: General;  Laterality: Left;  . BACK SURGERY    . BREAST BIOPSY Left   . BREAST LUMPECTOMY Left 2016   INVASIVE DUCTAL CARCINOMA & IMC neg margins   . BREAST LUMPECTOMY WITH AXILLARY LYMPH NODE DISSECTION Left 08/03/2014   Procedure: BREAST LUMPECTOMY WITH AXILLARY LYMPH NODE DISSECTION;  Surgeon: Robert Bellow, MD;  Location: [facility] ORS;  Service: General;  Laterality: Left;  . BREAST SURGERY Left Aug 03, 2014   Wide excision, sentinel node biopsy.  . CHOLECYSTECTOMY    . COLONOSCOPY  2013   Dr. Vira Agar  . COLONOSCOPY WITH PROPOFOL N/A 05/30/2015   Procedure: COLONOSCOPY WITH PROPOFOL;  Surgeon: Manya Silvas, MD;  [facility] ENDOSCOPY;  Service: Endoscopy;  Laterality: N/A;  . DILATION AND CURETTAGE OF UTERUS N/A 01/16/2017   Procedure: DILATATION AND CURETTAGE;  Surgeon: Ward, Honor Loh, MD;  Location: [facility] ORS;  Service: Gynecology;  Laterality: N/A;  . LAPAROSCOPIC BILATERAL SALPINGO OOPHERECTOMY Bilateral  02/06/2017   Procedure: LAPAROSCOPIC BILATERAL SALPINGO OOPHORECTOMY;  Surgeon: Ward, Honor Loh, MD;  Location: [facility] ORS;  Service: Gynecology;  Laterality: Bilateral;  . LAPAROSCOPIC HYSTERECTOMY N/A 02/06/2017   Procedure: HYSTERECTOMY TOTAL LAPAROSCOPIC;  Surgeon: Ward, Honor Loh, MD;  Location: [facility] ORS;  Service: Gynecology;  Laterality: N/A;  . OOPHORECTOMY    . PORTACATH PLACEMENT Right 08/31/2014   Procedure: INSERTION PORT-A-CATH;  Surgeon: Robert Bellow, MD;  Location: [facility] ORS;  Service: General;  Laterality: Right;  . REDUCTION MAMMAPLASTY Bilateral 2016  . SENTINEL NODE BIOPSY Left 08/03/2014   Procedure: SENTINEL NODE BIOPSY;  Surgeon: Robert Bellow, MD;  Location: [facility] ORS;  Service: General;  Laterality: Left;  . skin cancer  removal  2015   nose   . TONSILLECTOMY      Family History  Problem Relation Age of Onset  . Breast cancer Mother 26            Social History:  reports that she quit smoking about 43 years ago. Her smoking use included cigarettes. She has a 2.00 pack-year smoking history. She has never used smokeless tobacco. She reports current alcohol use. She reports that she does not use drugs. She is retired from [employer]. She lives in [place]. Her husband died 4 years ago. Her sister's name is Arville Go. She has a pitbull/greyhound mix named Chance. The patient is alone today.  Allergies:  Allergies  Allergen Reactions  . Codeine Other (See Comments)    Severe constipation  . Losartan Other (See Comments)    Sick on stomach  . Lovastatin Other (See Comments)    Per pt headache  . Sulfa Antibiotics Hives and Rash    Current Medications: Current Outpatient Medications  Medication Sig Dispense Refill  . acetaminophen (TYLENOL) 500 MG tablet Take 500 mg by mouth every 6 (six) hours as needed.    Marland Kitchen albuterol (PROVENTIL HFA;VENTOLIN HFA) 108 (90 Base) MCG/ACT inhaler Inhale 2 puffs into the lungs every 6 (six) hours as needed for wheezing or shortness of breath. 1 Inhaler 2  . aspirin EC 81 MG tablet Take 81 mg daily by mouth.     . cholecalciferol (VITAMIN D) 1000 units tablet Take 1,000 Units by mouth daily.    . citalopram (CELEXA) 20 MG tablet TAKE 1 TABLET (20 MG TOTAL) BY MOUTH DAILY. (Patient taking differently: Take 30 mg daily by mouth. ) 30 tablet 5  . docusate sodium (COLACE) 100 MG capsule Take 100 mg by mouth 2 (two) times daily.    . fluticasone (FLOVENT HFA) 44 MCG/ACT inhaler Inhale  into the lungs.    . gabapentin (NEURONTIN) 100 MG capsule TAKE 1 CAPSULE BY MOUTH THREE TIMES DAILY (Patient taking differently: Take 100 mg by mouth 2 (two) times daily. ) 90 capsule 1  . Multiple Vitamin (MULTIVITAMIN) tablet Take 2 tablets daily by mouth.     . NON FORMULARY Apply 1 application 2 (two) times daily topically. Unscented Free-up Professional Massage Cream Mixed with Engineer, technical sales    . Polyethyl Glycol-Propyl Glycol (SYSTANE ULTRA) 0.4-0.3 % SOLN Place 1 drop as needed into both eyes (for dry eyes).    . pravastatin (PRAVACHOL) 40 MG tablet Take 40 mg at bedtime by mouth.   3  . spironolactone (ALDACTONE) 25 MG tablet Take 25 mg by mouth 2 (two) times daily.     Marland Kitchen amoxicillin-clavulanate (AUGMENTIN) 875-125 MG tablet Take 1 tablet by mouth 2 (two) times daily. (Patient not taking: Reported on 09/16/2018)  20 tablet 0  . benzonatate (TESSALON) 200 MG capsule TAKE 1 CAPSULE BY MOUTH THREE TIMES DAILY AS NEEDED FOR COUGH FOR UP TO 7 DAYS (Patient not taking: Reported on 10/05/2019)    . methylPREDNISolone (MEDROL DOSEPAK) 4 MG TBPK tablet See admin instructions. see package (Patient not taking: Reported on 10/05/2019)     No current facility-administered medications for this visit.   Facility-Administered Medications Ordered in Other Visits  Medication Dose Route Frequency Provider Last Rate Last Admin  . 0.9 %  sodium chloride infusion   Intravenous Continuous Manya Silvas, MD      . diphenhydrAMINE (BENADRYL) capsule 25 mg  25 mg Oral Once Choksi, Delorise Shiner, MD      . sodium chloride 0.9 % injection 10 mL  10 mL Intravenous PRN Forest Gleason, MD   10 mL at 09/25/14 1418  . sodium chloride 0.9 % injection 10 mL  10 mL Intravenous PRN Forest Gleason, MD   10 mL at 01/15/15 1325  . sodium chloride flush (NS) 0.9 % injection 10 mL  10 mL Intravenous PRN Nolon Stalls C, MD   10 mL at 07/22/16 1011  . sodium chloride flush (NS) 0.9 % injection 10 mL  10 mL Intravenous PRN Lequita Asal, MD   10 mL at 10/06/19 1334    Review of Systems  Constitutional: Negative.  Negative for chills, diaphoresis, fever, malaise/fatigue and weight loss.       Feels "fine."  HENT: Negative.  Negative for congestion, ear discharge, ear pain (resolved), hearing loss, nosebleeds, sinus pain, sore throat and tinnitus.   Eyes: Negative.  Negative for blurred vision, double vision and photophobia.  Respiratory: Positive for shortness of breath (exertional- improved with inhaler). Negative for cough, hemoptysis and sputum production.   Cardiovascular: Negative.  Negative for chest pain, palpitations, orthopnea, leg swelling and PND.  Gastrointestinal: Positive for nausea (some mornings). Negative for abdominal pain, blood in stool, constipation, diarrhea, heartburn, melena and vomiting.       Last colonoscopy 05/30/2015.  Genitourinary: Negative.  Negative for dysuria, frequency, hematuria and urgency.  Musculoskeletal: Positive for back pain and joint pain (hands, hard to grip things). Negative for falls, myalgias and neck pain.  Skin: Negative for itching and rash.  Neurological: Positive for dizziness (occasional, lasts a few seconds) and tingling (intermittent neuropathy in toes >> fingers; stable). Negative for tremors, focal weakness, seizures, weakness and headaches.  Endo/Heme/Allergies: Negative.  Does not bruise/bleed easily.  Psychiatric/Behavioral: Negative.  Negative for depression and memory loss. The patient is not nervous/anxious and does not have insomnia.   All other systems reviewed and are negative.  Performance status (ECOG):  1  Vitals Blood pressure (!) 138/65, pulse 64, temperature 97.7 F (36.5 C), temperature source Tympanic, resp. rate 16, weight (!) 231 lb 4.2 oz (104.9 kg), SpO2 97 %.   Physical Exam Vitals and nursing note reviewed.  Constitutional:      General: She is not in acute distress.    Appearance: She is well-developed. She is not diaphoretic.  HENT:       Head: Normocephalic and atraumatic.     Comments: Short white hair.    Mouth/Throat:     Mouth: Mucous membranes are moist.     Pharynx: Oropharynx is clear.  Eyes:     General: No scleral icterus.    Conjunctiva/sclera: Conjunctivae normal.     Comments: Glasses.  Blue eyes.  Cardiovascular:     Rate and Rhythm: Normal rate  and regular rhythm.     Heart sounds: Normal heart sounds. No murmur heard.   Pulmonary:     Effort: Pulmonary effort is normal. No respiratory distress.     Breath sounds: Normal breath sounds. No wheezing or rales.  Chest:     Chest wall: No tenderness.     Breasts:        Right: Normal. No swelling, inverted nipple, mass, nipple discharge or skin change.        Left: No swelling, inverted nipple, mass, nipple discharge or skin change.     Comments: Stable scarring on left breast. Abdominal:     General: Bowel sounds are normal. There is no distension.     Palpations: Abdomen is soft. There is no mass.     Tenderness: There is no abdominal tenderness. There is no guarding or rebound.  Musculoskeletal:        General: No swelling or tenderness. Normal range of motion.     Cervical back: Normal range of motion and neck supple.  Lymphadenopathy:     Head:     Right side of head: No preauricular, posterior auricular or occipital adenopathy.     Left side of head: No preauricular, posterior auricular or occipital adenopathy.     Cervical: No cervical adenopathy.     Upper Body:     Right upper body: No supraclavicular or axillary adenopathy.     Left upper body: No supraclavicular or axillary adenopathy.     Lower Body: No right inguinal adenopathy. No left inguinal adenopathy.  Skin:    General: Skin is warm and dry.  Neurological:     Mental Status: She is alert and oriented to person, place, and time.     Cranial Nerves: Cranial nerves are intact.     Sensory: Sensation is intact.     Motor: Motor function is intact.     Coordination:  Coordination is intact.     Gait: Gait is intact.     Comments: Shaky left hand with finger-to-nose testing.  Psychiatric:        Behavior: Behavior normal.        Thought Content: Thought content normal.        Judgment: Judgment normal.    Infusion on 10/06/2019  Component Date Value Ref Range Status  . Sodium 10/06/2019 139  135 - 145 mmol/L Final  . Potassium 10/06/2019 3.8  3.5 - 5.1 mmol/L Final  . Chloride 10/06/2019 106  98 - 111 mmol/L Final  . CO2 10/06/2019 27  22 - 32 mmol/L Final  . Glucose, Bld 10/06/2019 101* 70 - 99 mg/dL Final   Glucose reference range applies only to samples taken after fasting for at least 8 hours.  . BUN 10/06/2019 12  8 - 23 mg/dL Final  . Creatinine, Ser 10/06/2019 0.93  0.44 - 1.00 mg/dL Final  . Calcium 10/06/2019 9.0  8.9 - 10.3 mg/dL Final  . Total Protein 10/06/2019 7.2  6.5 - 8.1 g/dL Final  . Albumin 10/06/2019 3.9  3.5 - 5.0 g/dL Final  . AST 10/06/2019 23  15 - 41 U/L Final  . ALT 10/06/2019 21  0 - 44 U/L Final  . Alkaline Phosphatase 10/06/2019 54  38 - 126 U/L Final  . Total Bilirubin 10/06/2019 1.5* 0.3 - 1.2 mg/dL Final  . GFR calc non Af Amer 10/06/2019 >60  >60 mL/min Final  . GFR calc Af Amer 10/06/2019 >60  >60 mL/min Final  . Georgiann Hahn  gap 10/06/2019 6  5 - 15 Final   Performed at [facility], [address] [place], [place] [address]  . WBC 10/06/2019 6.9  4.0 - 10.5 K/uL Final  . RBC 10/06/2019 4.92  3.87 - 5.11 MIL/uL Final  . Hemoglobin 10/06/2019 14.6  12.0 - 15.0 g/dL Final  . HCT 10/06/2019 43.7  36 - 46 % Final  . MCV 10/06/2019 88.8  80.0 - 100.0 fL Final  . MCH 10/06/2019 29.7  26.0 - 34.0 pg Final  . MCHC 10/06/2019 33.4  30.0 - 36.0 g/dL Final  . RDW 10/06/2019 12.7  11.5 - 15.5 % Final  . Platelets 10/06/2019 181  150 - 400 K/uL Final  . nRBC 10/06/2019 0.0  0.0 - 0.2 % Final  . Neutrophils Relative % 10/06/2019 49  % Final  . Neutro Abs 10/06/2019 3.4  1.7 - 7.7 K/uL Final  . Lymphocytes  Relative 10/06/2019 40  % Final  . Lymphs Abs 10/06/2019 2.8  0.7 - 4.0 K/uL Final  . Monocytes Relative 10/06/2019 8  % Final  . Monocytes Absolute 10/06/2019 0.5  0 - 1 K/uL Final  . Eosinophils Relative 10/06/2019 2  % Final  . Eosinophils Absolute 10/06/2019 0.1  0 - 0 K/uL Final  . Basophils Relative 10/06/2019 1  % Final  . Basophils Absolute 10/06/2019 0.0  0 - 0 K/uL Final  . Immature Granulocytes 10/06/2019 0  % Final  . Abs Immature Granulocytes 10/06/2019 0.02  0.00 - 0.07 K/uL Final   Performed at [facility], [address] [place],  [address]    Assessment:  Madison Christensen is a 69 y.o. female with stage IA Her2/neu + left breast cancerstatus wide excision on 08/03/2014. 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 Lymphvascular invasion was indeterminate (+ on original biopsy). DCIS was present. Three sentinel lymph nodes were negative. Tumor was ER -, PR -, and Her2/neu 3+ by IHC. Pathologic stage was T1cN0M0.  She received adjuvant TaxolandHerceptin. She received 10 of 12 weeks of Taxol(09/04/2014 - 11/06/2014). 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 She received Herceptinx 6 months (09/04/2014 - 03/27/2015). Herceptin was discontinued secondary to myalgias.  She completed radiationtherapy in 02/2015.  Left mammogram on 03/26/2018 revealed probable postoperative changes in the left breast.  Ultrasound revealed a 1.7 x 0.8 x 1.3 cm complex cystic mass at 12 o'clock 2 cm from the nipple felt secondary to postoperative hematoma/seroma.  Bilateral mammogram with unilateral left ultrasound on 10/01/2018 showed no significant change in the patient's focal fat necrosis/oil cyst.  Diagnostic mammogram on 10/03/2019 revealed no evidence of malignancy in either breast.  CA27.29has been followed: 16.3 on 03/11/2016, 9.8 on 07/22/2016, 18.6 on 11/24/2016, 15.5 on 03/26/2017, 13.2 on 09/03/2017, 12.0 on  01/14/2018, 14.4 on 03/25/2018, 16.7 on 09/16/2018 and 15.0 on 03/10/2019.  Bone density on 05/12/2016 revealed osteopeniawith a T score of -1.1 in the right femur and -0.4 in the AP spine L1-L2.  Bone density on 10/27/2018 showed osteopenia with a T-score of -1.3 of L1-L2 spine and normal right dual femur neck with a T-score of -1.0.  She underwent total laparoscopic hysterectomy and bilateral oophorectomyon 02/06/2017 for pelvic pain, adnexal mass, and postmenopausal bleeding. Pathologyrevealed no evidence of atypia or malignancy.  She has a history on an elevated bilirubin(indirect). 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  She does not plan to receive the COVID-19 vaccine at this time.  Symptomatically, she feels "fine".  She notes  some morning nausea.  She denies any numbness, weakness, balance or coordination issues.  Exam is stable.  Plan: 1.    Labs today: CBC with diff, CMP, CA 27.29 2. Stage IA Her2/neu + left breast cancer Clinically, she is doing well.  Exam reveals no evidence of recurrent disease.             Diagnostic mammogram on 10/03/2019 revealed no evidence of malignancy in either breast.  Schedule screening mammogram on 10/02/2020. 3. Osteopenia She takes vitamin D but no calcium.             Bone density on 10/27/2018 revealed osteopenia with a T score of -1.3 in the L1-L2 spine. 4.   Morning nausea  Neurologic exam is normal.  Discuss consideration for head imaging if nausea persist or neurologic symptoms develop.  Patient declines imaging at this time but will contact the clinic if any concerns. 5.   Port-a-cath  Discuss port removal with Dr. Bary Castilla. 6.   Screening mammogram on 10/02/2020. 7.   RTC in 1 year for MD assessment, labs (CBC with diff, CMP, CA 27-29) and review of interval mammogram.  I discussed the assessment and treatment plan with the patient.  The patient was provided an opportunity to ask questions and all were  answered.  The patient agreed with the plan and demonstrated an understanding of the instructions.  The patient was advised to call back if the symptoms worsen or if the condition fails to improve as anticipated.   Lequita Asal, MD, PhD    10/06/2019, 2:09 PM  I, Mirian Mo Tufford, am acting as a Education administrator for Lequita Asal, MD.  I, [place] Mike Gip, MD, have reviewed the above documentation for accuracy and completeness, and I agree with the above.

## 2019-10-06 ENCOUNTER — Inpatient Hospital Stay (HOSPITAL_BASED_OUTPATIENT_CLINIC_OR_DEPARTMENT_OTHER): Payer: Medicare HMO | Admitting: Hematology and Oncology

## 2019-10-06 ENCOUNTER — Inpatient Hospital Stay: Payer: Medicare HMO | Attending: Hematology and Oncology

## 2019-10-06 ENCOUNTER — Encounter: Payer: Self-pay | Admitting: Hematology and Oncology

## 2019-10-06 VITALS — BP 138/65 | HR 64 | Temp 97.7°F | Resp 16 | Wt 231.3 lb

## 2019-10-06 DIAGNOSIS — H9201 Otalgia, right ear: Secondary | ICD-10-CM | POA: Insufficient documentation

## 2019-10-06 DIAGNOSIS — G629 Polyneuropathy, unspecified: Secondary | ICD-10-CM | POA: Diagnosis not present

## 2019-10-06 DIAGNOSIS — R110 Nausea: Secondary | ICD-10-CM | POA: Insufficient documentation

## 2019-10-06 DIAGNOSIS — R0602 Shortness of breath: Secondary | ICD-10-CM | POA: Diagnosis not present

## 2019-10-06 DIAGNOSIS — Z803 Family history of malignant neoplasm of breast: Secondary | ICD-10-CM | POA: Insufficient documentation

## 2019-10-06 DIAGNOSIS — Z882 Allergy status to sulfonamides status: Secondary | ICD-10-CM | POA: Insufficient documentation

## 2019-10-06 DIAGNOSIS — C50912 Malignant neoplasm of unspecified site of left female breast: Secondary | ICD-10-CM

## 2019-10-06 DIAGNOSIS — M8589 Other specified disorders of bone density and structure, multiple sites: Secondary | ICD-10-CM | POA: Diagnosis not present

## 2019-10-06 DIAGNOSIS — Z7951 Long term (current) use of inhaled steroids: Secondary | ICD-10-CM | POA: Diagnosis not present

## 2019-10-06 DIAGNOSIS — R102 Pelvic and perineal pain: Secondary | ICD-10-CM | POA: Insufficient documentation

## 2019-10-06 DIAGNOSIS — R42 Dizziness and giddiness: Secondary | ICD-10-CM | POA: Insufficient documentation

## 2019-10-06 DIAGNOSIS — M85851 Other specified disorders of bone density and structure, right thigh: Secondary | ICD-10-CM

## 2019-10-06 DIAGNOSIS — Z923 Personal history of irradiation: Secondary | ICD-10-CM | POA: Insufficient documentation

## 2019-10-06 DIAGNOSIS — Z95828 Presence of other vascular implants and grafts: Secondary | ICD-10-CM | POA: Diagnosis not present

## 2019-10-06 DIAGNOSIS — Z885 Allergy status to narcotic agent status: Secondary | ICD-10-CM | POA: Diagnosis not present

## 2019-10-06 DIAGNOSIS — Z9221 Personal history of antineoplastic chemotherapy: Secondary | ICD-10-CM | POA: Diagnosis not present

## 2019-10-06 DIAGNOSIS — Z171 Estrogen receptor negative status [ER-]: Secondary | ICD-10-CM

## 2019-10-06 DIAGNOSIS — Z90722 Acquired absence of ovaries, bilateral: Secondary | ICD-10-CM | POA: Diagnosis not present

## 2019-10-06 DIAGNOSIS — Z87891 Personal history of nicotine dependence: Secondary | ICD-10-CM | POA: Diagnosis not present

## 2019-10-06 DIAGNOSIS — Z79899 Other long term (current) drug therapy: Secondary | ICD-10-CM | POA: Diagnosis not present

## 2019-10-06 DIAGNOSIS — Z9049 Acquired absence of other specified parts of digestive tract: Secondary | ICD-10-CM | POA: Insufficient documentation

## 2019-10-06 DIAGNOSIS — M791 Myalgia, unspecified site: Secondary | ICD-10-CM | POA: Diagnosis not present

## 2019-10-06 DIAGNOSIS — C50812 Malignant neoplasm of overlapping sites of left female breast: Secondary | ICD-10-CM | POA: Diagnosis present

## 2019-10-06 LAB — COMPREHENSIVE METABOLIC PANEL
ALT: 21 U/L (ref 0–44)
AST: 23 U/L (ref 15–41)
Albumin: 3.9 g/dL (ref 3.5–5.0)
Alkaline Phosphatase: 54 U/L (ref 38–126)
Anion gap: 6 (ref 5–15)
BUN: 12 mg/dL (ref 8–23)
CO2: 27 mmol/L (ref 22–32)
Calcium: 9 mg/dL (ref 8.9–10.3)
Chloride: 106 mmol/L (ref 98–111)
Creatinine, Ser: 0.93 mg/dL (ref 0.44–1.00)
GFR calc Af Amer: >60 mL/min (ref >60)
GFR calc non Af Amer: >60 mL/min (ref >60)
Glucose, Bld: 101 mg/dL — ABNORMAL HIGH (ref 70–99)
Potassium: 3.8 mmol/L (ref 3.5–5.1)
Sodium: 139 mmol/L (ref 135–145)
Total Bilirubin: 1.5 mg/dL — ABNORMAL HIGH (ref 0.3–1.2)
Total Protein: 7.2 g/dL (ref 6.5–8.1)

## 2019-10-06 LAB — CBC WITH DIFFERENTIAL/PLATELET
Abs Immature Granulocytes: 0.02 10*3/uL (ref 0.00–0.07)
Basophils Absolute: 0 10*3/uL (ref 0.0–0.1)
Basophils Relative: 1 %
Eosinophils Absolute: 0.1 10*3/uL (ref 0.0–0.5)
Eosinophils Relative: 2 %
HCT: 43.7 % (ref 36.0–46.0)
Hemoglobin: 14.6 g/dL (ref 12.0–15.0)
Immature Granulocytes: 0 %
Lymphocytes Relative: 40 %
Lymphs Abs: 2.8 10*3/uL (ref 0.7–4.0)
MCH: 29.7 pg (ref 26.0–34.0)
MCHC: 33.4 g/dL (ref 30.0–36.0)
MCV: 88.8 fL (ref 80.0–100.0)
Monocytes Absolute: 0.5 10*3/uL (ref 0.1–1.0)
Monocytes Relative: 8 %
Neutro Abs: 3.4 10*3/uL (ref 1.7–7.7)
Neutrophils Relative %: 49 %
Platelets: 181 10*3/uL (ref 150–400)
RBC: 4.92 MIL/uL (ref 3.87–5.11)
RDW: 12.7 % (ref 11.5–15.5)
WBC: 6.9 10*3/uL (ref 4.0–10.5)
nRBC: 0 % (ref 0.0–0.2)

## 2019-10-06 MED ORDER — HEPARIN SOD (PORK) LOCK FLUSH 100 UNIT/ML IV SOLN
500.0000 [IU] | Freq: Once | INTRAVENOUS | Status: AC
  Administered 2019-10-06: 500 [IU] via INTRAVENOUS
  Filled 2019-10-06: qty 5

## 2019-10-06 MED ORDER — SODIUM CHLORIDE 0.9% FLUSH
10.0000 mL | INTRAVENOUS | Status: DC | PRN
  Administered 2019-10-06: 10 mL via INTRAVENOUS
  Filled 2019-10-06: qty 10

## 2019-10-07 LAB — CANCER ANTIGEN 27.29: CA 27.29: 14.8 U/mL (ref 0.0–38.6)

## 2019-10-18 ENCOUNTER — Telehealth: Payer: Self-pay | Admitting: *Deleted

## 2019-10-18 NOTE — Telephone Encounter (Signed)
  Please call patient.  We discussed consideration of head imaging if nausea persists.  Ensure no medications implicated.  Does she want to see her PCP first before CT scan?  M

## 2019-10-18 NOTE — Telephone Encounter (Signed)
Patient called reporting that she has continued to have nausea every morning and that Dr Mike Gip told her to call if it continued. She states it is no better and that she continues to have daily nausea. Please advise

## 2019-10-19 ENCOUNTER — Telehealth: Payer: Self-pay

## 2019-10-19 NOTE — Telephone Encounter (Signed)
Patient states that she is about to go to the walk in clinic at [facility] clinic right now due to nausea and vomiting. Advised patient that we can look at their notes and she can call us to tell us what they say and then we can get a game plan and make decision about the CT scan. Patient agreeable.

## 2019-10-28 ENCOUNTER — Encounter
Admission: RE | Disposition: A | Discharge status: Home / Self Care | Payer: Self-pay | Source: Home / Self Care | Attending: Gastroenterology

## 2019-10-28 ENCOUNTER — Ambulatory Visit: Payer: Medicare HMO | Admitting: Certified Registered"

## 2019-10-28 ENCOUNTER — Ambulatory Visit
Admission: RE | Admit: 2019-10-28 | Discharge: 2019-10-28 | Disposition: A | Discharge status: Home / Self Care | Payer: Medicare HMO | Attending: Gastroenterology | Admitting: Gastroenterology

## 2019-10-28 ENCOUNTER — Encounter: Payer: Self-pay | Admitting: *Deleted

## 2019-10-28 DIAGNOSIS — Z7982 Long term (current) use of aspirin: Secondary | ICD-10-CM | POA: Diagnosis not present

## 2019-10-28 DIAGNOSIS — N189 Chronic kidney disease, unspecified: Secondary | ICD-10-CM | POA: Insufficient documentation

## 2019-10-28 DIAGNOSIS — Z923 Personal history of irradiation: Secondary | ICD-10-CM | POA: Diagnosis not present

## 2019-10-28 DIAGNOSIS — Z9221 Personal history of antineoplastic chemotherapy: Secondary | ICD-10-CM | POA: Diagnosis not present

## 2019-10-28 DIAGNOSIS — K449 Diaphragmatic hernia without obstruction or gangrene: Secondary | ICD-10-CM | POA: Insufficient documentation

## 2019-10-28 DIAGNOSIS — Z853 Personal history of malignant neoplasm of breast: Secondary | ICD-10-CM | POA: Diagnosis not present

## 2019-10-28 DIAGNOSIS — Z882 Allergy status to sulfonamides status: Secondary | ICD-10-CM | POA: Insufficient documentation

## 2019-10-28 DIAGNOSIS — Z885 Allergy status to narcotic agent status: Secondary | ICD-10-CM | POA: Diagnosis not present

## 2019-10-28 DIAGNOSIS — K219 Gastro-esophageal reflux disease without esophagitis: Secondary | ICD-10-CM | POA: Insufficient documentation

## 2019-10-28 DIAGNOSIS — Z888 Allergy status to other drugs, medicaments and biological substances status: Secondary | ICD-10-CM | POA: Insufficient documentation

## 2019-10-28 DIAGNOSIS — R1084 Generalized abdominal pain: Secondary | ICD-10-CM | POA: Diagnosis present

## 2019-10-28 DIAGNOSIS — I129 Hypertensive chronic kidney disease with stage 1 through stage 4 chronic kidney disease, or unspecified chronic kidney disease: Secondary | ICD-10-CM | POA: Insufficient documentation

## 2019-10-28 DIAGNOSIS — F329 Major depressive disorder, single episode, unspecified: Secondary | ICD-10-CM | POA: Insufficient documentation

## 2019-10-28 DIAGNOSIS — R112 Nausea with vomiting, unspecified: Secondary | ICD-10-CM | POA: Diagnosis present

## 2019-10-28 DIAGNOSIS — J449 Chronic obstructive pulmonary disease, unspecified: Secondary | ICD-10-CM | POA: Diagnosis not present

## 2019-10-28 DIAGNOSIS — E785 Hyperlipidemia, unspecified: Secondary | ICD-10-CM | POA: Insufficient documentation

## 2019-10-28 DIAGNOSIS — Z79899 Other long term (current) drug therapy: Secondary | ICD-10-CM | POA: Insufficient documentation

## 2019-10-28 DIAGNOSIS — Z7951 Long term (current) use of inhaled steroids: Secondary | ICD-10-CM | POA: Insufficient documentation

## 2019-10-28 DIAGNOSIS — K2950 Unspecified chronic gastritis without bleeding: Secondary | ICD-10-CM | POA: Insufficient documentation

## 2019-10-28 HISTORY — PX: ESOPHAGOGASTRODUODENOSCOPY (EGD) WITH PROPOFOL: SHX5813

## 2019-10-28 HISTORY — DX: Nausea: R11.0

## 2019-10-28 SURGERY — ESOPHAGOGASTRODUODENOSCOPY (EGD) WITH PROPOFOL
Anesthesia: General

## 2019-10-28 MED ORDER — SODIUM CHLORIDE 0.9 % IV SOLN: INTRAVENOUS | Status: DC

## 2019-10-28 MED ORDER — PROPOFOL 10 MG/ML IV BOLUS
INTRAVENOUS | Status: DC | PRN
  Administered 2019-10-28: 50 mg via INTRAVENOUS
  Administered 2019-10-28: 20 mg via INTRAVENOUS
  Administered 2019-10-28: 10 mg via INTRAVENOUS
  Administered 2019-10-28: 70 mg via INTRAVENOUS
  Administered 2019-10-28: 50 mg via INTRAVENOUS

## 2019-10-28 NOTE — Transfer of Care (Signed)
Immediate Anesthesia Transfer of Care Note  Patient: Madison Christensen  Procedure(s) Performed: ESOPHAGOGASTRODUODENOSCOPY (EGD) WITH PROPOFOL (N/A )  Patient Location: PACU and Endoscopy Unit  Anesthesia Type:General  Level of Consciousness: awake and drowsy  Airway & Oxygen Therapy: Patient Spontanous Breathing and Patient connected to nasal cannula oxygen  Post-op Assessment: Report given to RN and Post -op Vital signs reviewed and stable  Post vital signs: Reviewed and stable  Last Vitals:  Vitals Value Taken Time  BP 105/55 10/28/19 1518  Temp    Pulse 96 10/28/19 1519  Resp 15 10/28/19 1519  SpO2 97 % 10/28/19 1519  Vitals shown include unvalidated device data.  Last Pain:  Vitals:   10/28/19 1518  TempSrc:   PainSc: (P) Asleep         Complications: No complications documented.

## 2019-10-28 NOTE — Anesthesia Postprocedure Evaluation (Signed)
Anesthesia Post Note  Patient: Madison Christensen  Procedure(s) Performed: ESOPHAGOGASTRODUODENOSCOPY (EGD) WITH PROPOFOL (N/A )  Patient location during evaluation: Endoscopy Anesthesia Type: General Level of consciousness: awake and alert Pain management: pain level controlled Vital Signs Assessment: post-procedure vital signs reviewed and stable Respiratory status: spontaneous breathing and respiratory function stable Cardiovascular status: stable Anesthetic complications: no   No complications documented.   Last Vitals:  Vitals:   10/28/19 1548 10/28/19 1550  BP: 114/60 114/60  Pulse: 72 77  Resp: 13 15  Temp:    SpO2: 96% 100%    Last Pain:  Vitals:   10/28/19 1550  TempSrc:   PainSc: 0-No pain                 Harleen Fineberg K

## 2019-10-28 NOTE — Anesthesia Preprocedure Evaluation (Signed)
Anesthesia Evaluation  Patient identified by MRN, date of birth, ID band Patient awake    Reviewed: Allergy & Precautions, NPO status , Patient's Chart, lab work & pertinent test results  History of Anesthesia Complications Negative for: history of anesthetic complications  Airway Mallampati: II  TM Distance: >3 FB Neck ROM: Full    Dental no notable dental hx.    Pulmonary neg sleep apnea, COPD,  COPD inhaler, former smoker,    breath sounds clear to auscultation- rhonchi (-) wheezing      Cardiovascular hypertension, Pt. on medications (-) CAD, (-) Past MI, (-) Cardiac Stents and (-) CABG  Rhythm:Regular Rate:Normal - Systolic murmurs and - Diastolic murmurs    Neuro/Psych neg Seizures PSYCHIATRIC DISORDERS Depression negative neurological ROS     GI/Hepatic Neg liver ROS, GERD  ,  Endo/Other  negative endocrine ROSneg diabetes  Renal/GU Renal InsufficiencyRenal disease     Musculoskeletal negative musculoskeletal ROS (+)   Abdominal (+) + obese,   Peds  Hematology negative hematology ROS (+)   Anesthesia Other Findings Past Medical History: 2016: Breast cancer ([place])     Comment:  left No date: Cancer [facility])     Comment:  skin  07/30/2014: Carcinoma of left breast ([facility])     Comment:  1.4 cm, T1c, N0, ER negative, PR negative, HER-2               positive No date: Chronic kidney disease     Comment:  h/o renal insuff 7998: Complication of anesthesia     Comment:  oversensitive to noise, lights No date: COPD (chronic obstructive pulmonary disease) ([place]) 1995: Depression No date: GERD (gastroesophageal reflux disease) No date: Hyperlipidemia No date: Hypertension No date: Nausea No date: Personal history of chemotherapy 2016: Personal history of radiation therapy     Comment:  LEFT lumpectomy   Reproductive/Obstetrics                             Anesthesia Physical Anesthesia  Plan  ASA: III  Anesthesia Plan: General   Post-op Pain Management:    Induction: Intravenous  PONV Risk Score and Plan: 2 and Propofol infusion  Airway Management Planned: Natural Airway  Additional Equipment:   Intra-op Plan:   Post-operative Plan:   Informed Consent: I have reviewed the patients History and Physical, chart, labs and discussed the procedure including the risks, benefits and alternatives for the proposed anesthesia with the patient or authorized representative who has indicated his/her understanding and acceptance.     Dental advisory given  Plan Discussed with: CRNA and Anesthesiologist  Anesthesia Plan Comments:         Anesthesia Quick Evaluation

## 2019-10-28 NOTE — H&P (Signed)
Outpatient short stay form Pre-procedure 10/28/2019 2:59 PM Raylene Miyamoto MD, MPH  Primary Physician: Dr. Iona Beard  Reason for visit:  Nausea  History of present illness:    69 y/o lady with recent onset of nausea mostly in the morning. No dysphagia. Some constipation. No nsaids. No blood thinners. No history of GI malignancies. History of hysterectomy.    Current Facility-Administered Medications:  .  0.9 %  sodium chloride infusion, , Intravenous, Continuous, Shawnell Dykes, Hilton Cork, MD, Last Rate: 20 mL/hr at 10/28/19 1423, New Bag at 10/28/19 1423  Facility-Administered Medications Ordered in Other Encounters:  .  0.9 %  sodium chloride infusion, , Intravenous, Continuous, Manya Silvas, MD .  diphenhydrAMINE (BENADRYL) capsule 25 mg, 25 mg, Oral, Once, Choksi, Janak, MD .  sodium chloride 0.9 % injection 10 mL, 10 mL, Intravenous, PRN, Choksi, Janak, MD, 10 mL at 09/25/14 1418 .  sodium chloride 0.9 % injection 10 mL, 10 mL, Intravenous, PRN, Choksi, Janak, MD, 10 mL at 01/15/15 1325 .  sodium chloride flush (NS) 0.9 % injection 10 mL, 10 mL, Intravenous, PRN, Mike Gip, Melissa C, MD, 10 mL at 07/22/16 1011  Medications Prior to Admission  Medication Sig Dispense Refill Last Dose  . albuterol (PROVENTIL HFA;VENTOLIN HFA) 108 (90 Base) MCG/ACT inhaler Inhale 2 puffs into the lungs every 6 (six) hours as needed for wheezing or shortness of breath. 1 Inhaler 2 10/28/2019 at Unknown time  . aspirin EC 81 MG tablet Take 81 mg daily by mouth.    10/27/2019 at Unknown time  . citalopram (CELEXA) 20 MG tablet TAKE 1 TABLET (20 MG TOTAL) BY MOUTH DAILY. (Patient taking differently: Take 30 mg daily by mouth. ) 30 tablet 5 10/27/2019 at Unknown time  . fluticasone (FLOVENT HFA) 44 MCG/ACT inhaler Inhale into the lungs.   10/28/2019 at Unknown time  . gabapentin (NEURONTIN) 100 MG capsule TAKE 1 CAPSULE BY MOUTH THREE TIMES DAILY (Patient taking differently: Take 100 mg by mouth 2 (two) times  daily. ) 90 capsule 1 10/27/2019 at Unknown time  . omeprazole (PRILOSEC) 20 MG capsule Take 20 mg by mouth daily.     . promethazine (PHENERGAN) 12.5 MG tablet Take 12.5 mg by mouth every 6 (six) hours as needed for nausea or vomiting.     Marland Kitchen spironolactone (ALDACTONE) 25 MG tablet Take 25 mg by mouth 2 (two) times daily.    10/27/2019 at Unknown time  . acetaminophen (TYLENOL) 500 MG tablet Take 500 mg by mouth every 6 (six) hours as needed.     Marland Kitchen amoxicillin-clavulanate (AUGMENTIN) 875-125 MG tablet Take 1 tablet by mouth 2 (two) times daily. (Patient not taking: Reported on 09/16/2018) 20 tablet 0   . benzonatate (TESSALON) 200 MG capsule TAKE 1 CAPSULE BY MOUTH THREE TIMES DAILY AS NEEDED FOR COUGH FOR UP TO 7 DAYS (Patient not taking: Reported on 10/05/2019)     . cholecalciferol (VITAMIN D) 1000 units tablet Take 1,000 Units by mouth daily.     Marland Kitchen docusate sodium (COLACE) 100 MG capsule Take 100 mg by mouth 2 (two) times daily.     . methylPREDNISolone (MEDROL DOSEPAK) 4 MG TBPK tablet See admin instructions. see package (Patient not taking: Reported on 10/05/2019)     . Multiple Vitamin (MULTIVITAMIN) tablet Take 2 tablets daily by mouth.      . NON FORMULARY Apply 1 application 2 (two) times daily topically. Unscented Free-up Professional Massage Cream Mixed with Engineer, technical sales     . Polyethyl Glycol-Propyl Glycol (  SYSTANE ULTRA) 0.4-0.3 % SOLN Place 1 drop as needed into both eyes (for dry eyes).     . pravastatin (PRAVACHOL) 40 MG tablet Take 40 mg at bedtime by mouth.   3      Allergies  Allergen Reactions  . Codeine Other (See Comments)    Severe constipation  . Losartan Other (See Comments)    Sick on stomach  . Lovastatin Other (See Comments)    Per pt headache  . Sulfa Antibiotics Hives and Rash     Past Medical History:  Diagnosis Date  . Breast cancer ([place]) 2016   left  . Cancer ([place])    skin   . 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 Chronic kidney disease    h/o renal insuff  . Complication of anesthesia 2004   oversensitive to noise, lights  . COPD (chronic obstructive pulmonary disease) ([place])   . Depression 1995  . GERD (gastroesophageal reflux disease)   . Hyperlipidemia   . Hypertension   . Nausea   . Personal history of chemotherapy   . Personal history of radiation therapy 2016   LEFT lumpectomy    Review of systems:  Otherwise negative.    Physical Exam  Gen: Alert, oriented. Appears stated age.  HEENT: [place]/AT. PERRLA. Lungs: No respiratory distress Abd: soft, benign, no masses.  Ext: No edema.     Planned procedures: Proceed with EGD. The patient understands the nature of the planned procedure, indications, risks, alternatives and potential complications including but not limited to bleeding, infection, perforation, damage to internal organs and possible oversedation/side effects from anesthesia. The patient agrees and gives consent to proceed.  Please refer to procedure notes for findings, recommendations and patient disposition/instructions.     Raylene Miyamoto MD, MPH Gastroenterology 10/28/2019  2:59 PM

## 2019-10-28 NOTE — Op Note (Signed)
[facility] Gastroenterology Patient Name: Madison Christensen Procedure Date: 10/28/2019 3:03 PM MRN: [id number] Account #: [id number] Date of Birth: March 20, 1950 Admit Type: Outpatient Age: 69 Room: [facility] ENDO ROOM 3 Gender: Female Note Status: Finalized Procedure:             Upper GI endoscopy Indications:           Generalized abdominal pain, Nausea with vomiting Providers:             Andrey Farmer MD, MD Referring MD:          Rubbie Battiest. Iona Beard MD, MD (Referring MD) Medicines:             Monitored Anesthesia Care Complications:         No immediate complications. Estimated blood loss:                         Minimal. Procedure:             Pre-Anesthesia Assessment:                        - Prior to the procedure, a History and Physical was                         performed, and patient medications and allergies were                         reviewed. The patient is competent. The risks and                         benefits of the procedure and the sedation options and                         risks were discussed with the patient. All questions                         were answered and informed consent was obtained.                         Patient identification and proposed procedure were                         verified by the physician, the nurse, the anesthetist                         and the technician in the endoscopy suite. Mental                         Status Examination: alert and oriented. Airway                         Examination: normal oropharyngeal airway and neck                         mobility. Respiratory Examination: clear to                         auscultation. CV Examination: normal. Prophylactic  Antibiotics: The patient does not require prophylactic                         antibiotics. Prior Anticoagulants: The patient has                         taken no previous anticoagulant or antiplatelet                          agents. ASA Grade Assessment: II - A patient with mild                         systemic disease. After reviewing the risks and                         benefits, the patient was deemed in satisfactory                         condition to undergo the procedure. The anesthesia                         plan was to use monitored anesthesia care (MAC).                         Immediately prior to administration of medications,                         the patient was re-assessed for adequacy to receive                         sedatives. The heart rate, respiratory rate, oxygen                         saturations, blood pressure, adequacy of pulmonary                         ventilation, and response to care were monitored                         throughout the procedure. The physical status of the                         patient was re-assessed after the procedure.                        After obtaining informed consent, the endoscope was                         passed under direct vision. Throughout the procedure,                         the patient's blood pressure, pulse, and oxygen                         saturations were monitored continuously. The Endoscope                         was introduced through the mouth, and advanced to the  second part of duodenum. The upper GI endoscopy was                         accomplished without difficulty. The patient tolerated                         the procedure well. Findings:      A small hiatal hernia was present.      The examined esophagus was normal.      Localized mild inflammation characterized by erythema was found in the       gastric antrum. Biopsies were taken with a cold forceps for Helicobacter       pylori testing. Estimated blood loss was minimal.      The examined duodenum was normal. Impression:            - Small hiatal hernia.                        - Normal esophagus.                        - Gastritis.  Biopsied.                        - Normal examined duodenum. Recommendation:        - Discharge patient to home.                        - Resume previous diet.                        - Continue present medications.                        - Await pathology results.                        - Return to referring physician as previously                         scheduled. Procedure Code(s):     --- Professional ---                        [phone], Esophagogastroduodenoscopy, flexible,                         transoral; with biopsy, single or multiple Diagnosis Code(s):     --- Professional ---                        K44.9, Diaphragmatic hernia without obstruction or                         gangrene                        K29.70, Gastritis, unspecified, without bleeding                        R10.84, Generalized abdominal pain                        R11.2, Nausea with vomiting, unspecified CPT  copyright 2019 American Medical Association. All rights reserved. The codes documented in this report are preliminary and upon coder review may  be revised to meet current compliance requirements. Andrey Farmer, MD Andrey Farmer MD, MD 10/28/2019 3:18:20 PM Number of Addenda: 0 Note Initiated On: 10/28/2019 3:03 PM Estimated Blood Loss:  Estimated blood loss was minimal.      [facility]

## 2019-10-31 ENCOUNTER — Encounter: Payer: Self-pay | Admitting: Gastroenterology

## 2019-11-02 LAB — SURGICAL PATHOLOGY

## 2020-01-26 ENCOUNTER — Other Ambulatory Visit: Payer: Self-pay

## 2020-01-26 ENCOUNTER — Inpatient Hospital Stay: Payer: Medicare HMO | Attending: Hematology and Oncology

## 2020-01-26 DIAGNOSIS — Z95828 Presence of other vascular implants and grafts: Secondary | ICD-10-CM

## 2020-01-26 DIAGNOSIS — Z452 Encounter for adjustment and management of vascular access device: Secondary | ICD-10-CM | POA: Insufficient documentation

## 2020-01-26 DIAGNOSIS — C50912 Malignant neoplasm of unspecified site of left female breast: Secondary | ICD-10-CM | POA: Diagnosis present

## 2020-01-26 DIAGNOSIS — M8588 Other specified disorders of bone density and structure, other site: Secondary | ICD-10-CM | POA: Insufficient documentation

## 2020-01-26 DIAGNOSIS — Z171 Estrogen receptor negative status [ER-]: Secondary | ICD-10-CM | POA: Diagnosis not present

## 2020-01-26 MED ORDER — HEPARIN SOD (PORK) LOCK FLUSH 100 UNIT/ML IV SOLN
500.0000 [IU] | Freq: Once | INTRAVENOUS | Status: AC
  Administered 2020-01-26: 500 [IU] via INTRAVENOUS
  Filled 2020-01-26: qty 5

## 2020-01-26 MED ORDER — SODIUM CHLORIDE 0.9% FLUSH
10.0000 mL | Freq: Once | INTRAVENOUS | Status: AC
  Administered 2020-01-26: 10 mL via INTRAVENOUS
  Filled 2020-01-26: qty 10

## 2020-01-26 MED ORDER — HEPARIN SOD (PORK) LOCK FLUSH 100 UNIT/ML IV SOLN
INTRAVENOUS | Status: AC
  Filled 2020-01-26: qty 5

## 2020-06-14 ENCOUNTER — Other Ambulatory Visit: Payer: Self-pay

## 2020-06-14 ENCOUNTER — Inpatient Hospital Stay: Payer: Medicare HMO | Attending: Hematology and Oncology

## 2020-06-14 DIAGNOSIS — C50912 Malignant neoplasm of unspecified site of left female breast: Secondary | ICD-10-CM | POA: Insufficient documentation

## 2020-06-14 DIAGNOSIS — Z171 Estrogen receptor negative status [ER-]: Secondary | ICD-10-CM | POA: Diagnosis not present

## 2020-06-14 DIAGNOSIS — Z95828 Presence of other vascular implants and grafts: Secondary | ICD-10-CM

## 2020-06-14 MED ORDER — HEPARIN SOD (PORK) LOCK FLUSH 100 UNIT/ML IV SOLN
500.0000 [IU] | Freq: Once | INTRAVENOUS | Status: AC
  Administered 2020-06-14: 500 [IU] via INTRAVENOUS
  Filled 2020-06-14: qty 5

## 2020-06-14 MED ORDER — SODIUM CHLORIDE 0.9% FLUSH
10.0000 mL | Freq: Once | INTRAVENOUS | Status: AC
  Administered 2020-06-14: 10 mL via INTRAVENOUS
  Filled 2020-06-14: qty 10

## 2020-10-08 ENCOUNTER — Ambulatory Visit: Payer: Medicare HMO | Admitting: Hematology and Oncology

## 2020-10-08 ENCOUNTER — Other Ambulatory Visit: Payer: Medicare HMO

## 2020-10-13 ENCOUNTER — Other Ambulatory Visit: Payer: Self-pay | Admitting: *Deleted

## 2020-10-13 DIAGNOSIS — C50912 Malignant neoplasm of unspecified site of left female breast: Secondary | ICD-10-CM

## 2020-10-17 ENCOUNTER — Encounter: Payer: Self-pay | Admitting: Oncology

## 2020-10-17 ENCOUNTER — Inpatient Hospital Stay: Payer: Medicare HMO

## 2020-10-17 ENCOUNTER — Inpatient Hospital Stay: Payer: Medicare HMO | Attending: Hematology and Oncology | Admitting: Oncology

## 2020-10-17 ENCOUNTER — Other Ambulatory Visit: Payer: Self-pay

## 2020-10-17 VITALS — BP 135/61 | HR 83 | Temp 97.5°F | Resp 18 | Wt 231.4 lb

## 2020-10-17 DIAGNOSIS — Z885 Allergy status to narcotic agent status: Secondary | ICD-10-CM | POA: Insufficient documentation

## 2020-10-17 DIAGNOSIS — Z171 Estrogen receptor negative status [ER-]: Secondary | ICD-10-CM | POA: Diagnosis not present

## 2020-10-17 DIAGNOSIS — Z90722 Acquired absence of ovaries, bilateral: Secondary | ICD-10-CM | POA: Diagnosis not present

## 2020-10-17 DIAGNOSIS — Z882 Allergy status to sulfonamides status: Secondary | ICD-10-CM | POA: Diagnosis not present

## 2020-10-17 DIAGNOSIS — Z08 Encounter for follow-up examination after completed treatment for malignant neoplasm: Secondary | ICD-10-CM | POA: Diagnosis not present

## 2020-10-17 DIAGNOSIS — G629 Polyneuropathy, unspecified: Secondary | ICD-10-CM | POA: Insufficient documentation

## 2020-10-17 DIAGNOSIS — C50912 Malignant neoplasm of unspecified site of left female breast: Secondary | ICD-10-CM | POA: Insufficient documentation

## 2020-10-17 DIAGNOSIS — M2550 Pain in unspecified joint: Secondary | ICD-10-CM | POA: Diagnosis not present

## 2020-10-17 DIAGNOSIS — Z79899 Other long term (current) drug therapy: Secondary | ICD-10-CM | POA: Diagnosis not present

## 2020-10-17 DIAGNOSIS — M549 Dorsalgia, unspecified: Secondary | ICD-10-CM | POA: Diagnosis not present

## 2020-10-17 DIAGNOSIS — Z853 Personal history of malignant neoplasm of breast: Secondary | ICD-10-CM | POA: Diagnosis not present

## 2020-10-17 DIAGNOSIS — Z87891 Personal history of nicotine dependence: Secondary | ICD-10-CM | POA: Diagnosis not present

## 2020-10-17 DIAGNOSIS — R5383 Other fatigue: Secondary | ICD-10-CM | POA: Diagnosis not present

## 2020-10-17 DIAGNOSIS — Z803 Family history of malignant neoplasm of breast: Secondary | ICD-10-CM | POA: Insufficient documentation

## 2020-10-17 DIAGNOSIS — Z923 Personal history of irradiation: Secondary | ICD-10-CM | POA: Insufficient documentation

## 2020-10-17 DIAGNOSIS — Z9049 Acquired absence of other specified parts of digestive tract: Secondary | ICD-10-CM | POA: Insufficient documentation

## 2020-10-17 DIAGNOSIS — Z95828 Presence of other vascular implants and grafts: Secondary | ICD-10-CM

## 2020-10-17 LAB — COMPREHENSIVE METABOLIC PANEL
ALT: 19 U/L (ref 0–44)
AST: 22 U/L (ref 15–41)
Albumin: 3.9 g/dL (ref 3.5–5.0)
Alkaline Phosphatase: 44 U/L (ref 38–126)
Anion gap: 6 (ref 5–15)
BUN: 14 mg/dL (ref 8–23)
CO2: 25 mmol/L (ref 22–32)
Calcium: 8.8 mg/dL — ABNORMAL LOW (ref 8.9–10.3)
Chloride: 105 mmol/L (ref 98–111)
Creatinine, Ser: 0.92 mg/dL (ref 0.44–1.00)
GFR, Estimated: >60 mL/min (ref >60)
Glucose, Bld: 110 mg/dL — ABNORMAL HIGH (ref 70–99)
Potassium: 3.7 mmol/L (ref 3.5–5.1)
Sodium: 136 mmol/L (ref 135–145)
Total Bilirubin: 1.4 mg/dL — ABNORMAL HIGH (ref 0.3–1.2)
Total Protein: 7 g/dL (ref 6.5–8.1)

## 2020-10-17 LAB — CBC WITH DIFFERENTIAL/PLATELET
Abs Immature Granulocytes: 0.01 10*3/uL (ref 0.00–0.07)
Basophils Absolute: 0.1 10*3/uL (ref 0.0–0.1)
Basophils Relative: 1 %
Eosinophils Absolute: 0.1 10*3/uL (ref 0.0–0.5)
Eosinophils Relative: 2 %
HCT: 41.7 % (ref 36.0–46.0)
Hemoglobin: 14.1 g/dL (ref 12.0–15.0)
Immature Granulocytes: 0 %
Lymphocytes Relative: 43 %
Lymphs Abs: 2.5 10*3/uL (ref 0.7–4.0)
MCH: 29.9 pg (ref 26.0–34.0)
MCHC: 33.8 g/dL (ref 30.0–36.0)
MCV: 88.3 fL (ref 80.0–100.0)
Monocytes Absolute: 0.6 10*3/uL (ref 0.1–1.0)
Monocytes Relative: 10 %
Neutro Abs: 2.5 10*3/uL (ref 1.7–7.7)
Neutrophils Relative %: 44 %
Platelets: 195 10*3/uL (ref 150–400)
RBC: 4.72 MIL/uL (ref 3.87–5.11)
RDW: 12.7 % (ref 11.5–15.5)
WBC: 5.7 10*3/uL (ref 4.0–10.5)
nRBC: 0 % (ref 0.0–0.2)

## 2020-10-17 MED ORDER — SODIUM CHLORIDE 0.9% FLUSH
10.0000 mL | INTRAVENOUS | Status: DC | PRN
  Administered 2020-10-17: 10 mL via INTRAVENOUS
  Filled 2020-10-17: qty 10

## 2020-10-17 MED ORDER — HEPARIN SOD (PORK) LOCK FLUSH 100 UNIT/ML IV SOLN
500.0000 [IU] | Freq: Once | INTRAVENOUS | Status: DC
  Filled 2020-10-17: qty 5

## 2020-10-17 MED ORDER — SODIUM CHLORIDE 0.9% FLUSH
10.0000 mL | INTRAVENOUS | Status: DC | PRN
  Filled 2020-10-17: qty 10

## 2020-10-17 MED ORDER — HEPARIN SOD (PORK) LOCK FLUSH 100 UNIT/ML IV SOLN
500.0000 [IU] | Freq: Once | INTRAVENOUS | Status: AC
  Administered 2020-10-17: 500 [IU] via INTRAVENOUS
  Filled 2020-10-17: qty 5

## 2020-10-18 LAB — CANCER ANTIGEN 27.29: CA 27.29: 14.5 U/mL (ref 0.0–38.6)

## 2020-10-18 NOTE — Progress Notes (Signed)
Hematology/Oncology Consult note [facility]  Telephone:([phone] Fax:([phone]  Patient Care Team: Sharyne Peach, MD as PCP - General (Family Medicine) Bary Castilla Forest Gleason, MD as Consulting Physician (General Surgery) Nicholaus Bloom, MD as Consulting Physician (Plastic Surgery) Serita Butcher, FNP (Family Medicine)   Name of the patient: Madison Christensen  [id number]  1950/12/16   Date of visit: 10/18/20  Diagnosis-history of stage I HER2 positive left breast cancer in 2016  Chief complaint/ Reason for visit-routine follow-up of breast cancer  Heme/Onc history: [name] is a 70 y.o. female with stage IA Her2/neu + left breast cancer status wide excision on  08/03/2014.  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  Lymphvascular invasion was indeterminate (+ on original biopsy).  DCIS was present.  Three sentinel lymph nodes were negative.  Tumor was ER -, PR -, and Her2/neu 3+ by IHC.  Pathologic stage was T1cN0M0.  She received adjuvant Taxol and Herceptin.  She received 10 of 12 weeks of Taxol (09/04/2014 - 11/06/2014).  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  She received Herceptin x 6 months (09/04/2014 - 03/27/2015).  Herceptin was discontinued secondary to myalgias.   She completed radiation therapy in 02/2015.    Interval history-patient is doing well overall and denies any specific complaints at this time.  She reports occasional dizzy spells which has been ongoing for a long time and are often self-limited.  ECOG PS- 1 Pain scale- 0   Review of systems- Review of Systems  Constitutional:  Positive for malaise/fatigue. Negative for chills, fever and weight loss.  HENT:  Negative for congestion, ear discharge and nosebleeds.   Eyes:  Negative for blurred vision.  Respiratory:  Negative for cough, hemoptysis, sputum production, shortness of breath and wheezing.   Cardiovascular:   Negative for chest pain, palpitations, orthopnea and claudication.  Gastrointestinal:  Negative for abdominal pain, blood in stool, constipation, diarrhea, heartburn, melena, nausea and vomiting.  Genitourinary:  Negative for dysuria, flank pain, frequency, hematuria and urgency.  Musculoskeletal:  Positive for back pain and joint pain. Negative for myalgias.  Skin:  Negative for rash.  Neurological:  Negative for dizziness, tingling, focal weakness, seizures, weakness and headaches.  Endo/Heme/Allergies:  Does not bruise/bleed easily.  Psychiatric/Behavioral:  Negative for depression and suicidal ideas. The patient does not have insomnia.      Allergies  Allergen Reactions   Codeine Other (See Comments)    Severe constipation   Losartan Other (See Comments)    Sick on stomach   Lovastatin Other (See Comments)    Per pt headache   Sulfa Antibiotics Hives and Rash     Past Medical History:  Diagnosis Date   Breast cancer ([place]) 2016   left   Cancer ([place])    skin    Carcinoma of left breast ([place]) 07/30/2014   1.4 cm, T1c, N0, ER negative, PR negative, HER-2 positive   Chronic kidney disease    h/o renal insuff   Complication of anesthesia 2004   oversensitive to noise, lights   COPD (chronic obstructive pulmonary disease) ([place])    Depression 1995   GERD (gastroesophageal reflux disease)    Hyperlipidemia    Hypertension    Nausea    Personal history of chemotherapy    Personal history of radiation therapy 2016   LEFT lumpectomy     Past Surgical History:  Procedure Laterality Date   ABDOMINAL HYSTERECTOMY  AXILLARY LYMPH NODE BIOPSY Left 08/03/2014   Procedure: AXILLARY LYMPH NODE BIOPSY;  Surgeon: Robert Bellow, MD;  Location: [facility] ORS;  Service: General;  Laterality: Left;   BACK SURGERY     BREAST BIOPSY Left    BREAST LUMPECTOMY Left 2016   INVASIVE DUCTAL CARCINOMA & [facility] neg margins    BREAST LUMPECTOMY WITH AXILLARY LYMPH NODE DISSECTION Left 08/03/2014    Procedure: BREAST LUMPECTOMY WITH AXILLARY LYMPH NODE DISSECTION;  Surgeon: Robert Bellow, MD;  Location: [facility] ORS;  Service: General;  Laterality: Left;   BREAST SURGERY Left Aug 03, 2014   Wide excision, sentinel node biopsy.   CHOLECYSTECTOMY     COLONOSCOPY  2013   Dr. Vira Agar   COLONOSCOPY WITH PROPOFOL N/A 05/30/2015   Procedure: COLONOSCOPY WITH PROPOFOL;  Surgeon: Manya Silvas, MD;  Location: [facility] ENDOSCOPY;  Service: Endoscopy;  Laterality: N/A;   DILATION AND CURETTAGE OF UTERUS N/A 01/16/2017   Procedure: DILATATION AND CURETTAGE;  Surgeon: Ward, Honor Loh, MD;  Location: [facility] ORS;  Service: Gynecology;  Laterality: N/A;   ESOPHAGOGASTRODUODENOSCOPY (EGD) WITH PROPOFOL N/A 10/28/2019   Procedure: ESOPHAGOGASTRODUODENOSCOPY (EGD) WITH PROPOFOL;  Surgeon: Lesly Rubenstein, MD;  Location: [facility] ENDOSCOPY;  Service: Endoscopy;  Laterality: N/A;   LAPAROSCOPIC BILATERAL SALPINGO OOPHERECTOMY Bilateral 02/06/2017   Procedure: LAPAROSCOPIC BILATERAL SALPINGO OOPHORECTOMY;  Surgeon: Ward, Honor Loh, MD;  Location: [facility] ORS;  Service: Gynecology;  Laterality: Bilateral;   LAPAROSCOPIC HYSTERECTOMY N/A 02/06/2017   Procedure: HYSTERECTOMY TOTAL LAPAROSCOPIC;  Surgeon: Ward, Honor Loh, MD;  Location: [facility] ORS;  Service: Gynecology;  Laterality: N/A;   OOPHORECTOMY     PORTACATH PLACEMENT Right 08/31/2014   Procedure: INSERTION PORT-A-CATH;  Surgeon: Robert Bellow, MD;  Location: [facility] ORS;  Service: General;  Laterality: Right;   REDUCTION MAMMAPLASTY Bilateral 2016   SENTINEL NODE BIOPSY Left 08/03/2014   Procedure: SENTINEL NODE BIOPSY;  Surgeon: Robert Bellow, MD;  Location: [facility] ORS;  Service: General;  Laterality: Left;   skin cancer  removal  2015   nose    TONSILLECTOMY      Social History   Socioeconomic History   Marital status: Widowed    Spouse name: Not on file   Number of children: Not on file   Years of education: Not on file   Highest education level:  Not on file  Occupational History   Not on file  Tobacco Use   Smoking status: Former    Packs/day: 1.00    Years: 2.00    Pack years: 2.00    Types: Cigarettes    Quit date: 07/30/1976    Years since quitting: 44.2   Smokeless tobacco: Never  Vaping Use   Vaping Use: Never used  Substance and Sexual Activity   Alcohol use: Yes    Alcohol/week: 0.0 - 2.0 standard drinks   Drug use: No   Sexual activity: Not on file  Other Topics Concern   Not on file  Social History Narrative   Not on file   Social Determinants of Health   Financial Resource Strain: Not on file  Food Insecurity: Not on file  Transportation Needs: Not on file  Physical Activity: Not on file  Stress: Not on file  Social Connections: Not on file  Intimate Partner Violence: Not on file    Family History  Problem Relation Age of Onset   Breast cancer Mother 53             Current Outpatient Medications:  acetaminophen (TYLENOL) 500 MG tablet, Take 500 mg by mouth every 6 (six) hours as needed., Disp: , Rfl:    albuterol (PROVENTIL HFA;VENTOLIN HFA) 108 (90 Base) MCG/ACT inhaler, Inhale 2 puffs into the lungs every 6 (six) hours as needed for wheezing or shortness of breath., Disp: 1 Inhaler, Rfl: 2   aspirin EC 81 MG tablet, Take 81 mg daily by mouth. , Disp: , Rfl:    cholecalciferol (VITAMIN D) 1000 units tablet, Take 1,000 Units by mouth daily., Disp: , Rfl:    citalopram (CELEXA) 20 MG tablet, TAKE 1 TABLET (20 MG TOTAL) BY MOUTH DAILY. (Patient taking differently: Take 30 mg by mouth daily.), Disp: 30 tablet, Rfl: 5   docusate sodium (COLACE) 100 MG capsule, Take 100 mg by mouth 2 (two) times daily., Disp: , Rfl:    fluticasone (FLONASE) 50 MCG/ACT nasal spray, Place into the nose., Disp: , Rfl:    fluticasone (FLOVENT HFA) 44 MCG/ACT inhaler, Inhale into the lungs., Disp: , Rfl:    gabapentin (NEURONTIN) 100 MG capsule, TAKE 1 CAPSULE BY MOUTH THREE TIMES DAILY (Patient taking differently: Take  100 mg by mouth 2 (two) times daily.), Disp: 90 capsule, Rfl: 1   ipratropium (ATROVENT) 0.06 % nasal spray, Place into the nose., Disp: , Rfl:    Multiple Vitamin (MULTIVITAMIN) tablet, Take 2 tablets daily by mouth. , Disp: , Rfl:    NON FORMULARY, Apply 1 application 2 (two) times daily topically. Unscented Free-up Professional Massage Cream Mixed with Engineer, technical sales, Disp: , Rfl:    pravastatin (PRAVACHOL) 40 MG tablet, Take 40 mg at bedtime by mouth. , Disp: , Rfl: 3   spironolactone (ALDACTONE) 25 MG tablet, Take 25 mg by mouth 2 (two) times daily. , Disp: , Rfl:    amoxicillin-clavulanate (AUGMENTIN) 875-125 MG tablet, Take 1 tablet by mouth 2 (two) times daily. (Patient not taking: No sig reported), Disp: 20 tablet, Rfl: 0   benzonatate (TESSALON) 200 MG capsule, TAKE 1 CAPSULE BY MOUTH THREE TIMES DAILY AS NEEDED FOR COUGH FOR UP TO 7 DAYS (Patient not taking: No sig reported), Disp: , Rfl:    chlorpheniramine-HYDROcodone (TUSSIONEX) 10-8 MG/5ML SUER, Take by mouth. (Patient not taking: Reported on 10/17/2020), Disp: , Rfl:    levofloxacin (LEVAQUIN) 500 MG tablet, Take 500 mg by mouth daily. (Patient not taking: Reported on 10/17/2020), Disp: , Rfl:    methylPREDNISolone (MEDROL DOSEPAK) 4 MG TBPK tablet, See admin instructions. see package (Patient not taking: No sig reported), Disp: , Rfl:    omeprazole (PRILOSEC) 20 MG capsule, Take 20 mg by mouth daily. (Patient not taking: Reported on 10/17/2020), Disp: , Rfl:    ondansetron (ZOFRAN-ODT) 4 MG disintegrating tablet, Take 4 mg by mouth every 8 (eight) hours as needed. (Patient not taking: Reported on 10/17/2020), Disp: , Rfl:    Polyethyl Glycol-Propyl Glycol 0.4-0.3 % SOLN, Place 1 drop as needed into both eyes (for dry eyes). (Patient not taking: Reported on 10/17/2020), Disp: , Rfl:    predniSONE (DELTASONE) 20 MG tablet, Take 40 mg by mouth daily. (Patient not taking: Reported on 10/17/2020), Disp: , Rfl:    promethazine (PHENERGAN) 12.5 MG  tablet, Take 12.5 mg by mouth every 6 (six) hours as needed for nausea or vomiting. (Patient not taking: Reported on 10/17/2020), Disp: , Rfl:    promethazine-dextromethorphan (PROMETHAZINE-DM) 6.25-15 MG/5ML syrup, Take by mouth. (Patient not taking: Reported on 10/17/2020), Disp: , Rfl:  No current facility-administered medications for this visit.  Facility-Administered Medications Ordered in  Other Visits:    0.9 %  sodium chloride infusion, , Intravenous, Continuous, Manya Silvas, MD   diphenhydrAMINE (BENADRYL) capsule 25 mg, 25 mg, Oral, Once, Choksi, Janak, MD   sodium chloride 0.9 % injection 10 mL, 10 mL, Intravenous, PRN, Choksi, Janak, MD, 10 mL at 09/25/14 1418   sodium chloride 0.9 % injection 10 mL, 10 mL, Intravenous, PRN, Choksi, Janak, MD, 10 mL at 01/15/15 1325   sodium chloride flush (NS) 0.9 % injection 10 mL, 10 mL, Intravenous, PRN, Mike Gip, Melissa C, MD, 10 mL at 07/22/16 1011  Physical exam:  Vitals:   10/17/20 1043  BP: 135/61  Pulse: 83  Resp: 18  Temp: (!) 97.5 F (36.4 C)  SpO2: 97%  Weight: 231 lb 6 oz (104.9 kg)   Physical Exam HENT:     Head: Normocephalic and atraumatic.  Eyes:     Pupils: Pupils are equal, round, and reactive to light.  Cardiovascular:     Rate and Rhythm: Normal rate and regular rhythm.     Heart sounds: Normal heart sounds.  Pulmonary:     Effort: Pulmonary effort is normal.     Breath sounds: Normal breath sounds.  Abdominal:     General: Bowel sounds are normal.     Palpations: Abdomen is soft.  Musculoskeletal:     Cervical back: Normal range of motion.  Skin:    General: Skin is warm and dry.  Neurological:     Mental Status: She is alert and oriented to person, place, and time.   Breast exam was performed in seated and lying down position. Patient is status post right lumpectomy with a well-healed surgical scar. No evidence of any palpable masses. No evidence of axillary adenopathy. No evidence of any palpable  masses or lumps in the left breast. No evidence of leftt axillary adenopathy   CMP Latest Ref Rng & Units 10/17/2020  Glucose 70 - 99 mg/dL 110(H)  BUN 8 - 23 mg/dL 14  Creatinine 0.44 - 1.00 mg/dL 0.92  Sodium 135 - 145 mmol/L 136  Potassium 3.5 - 5.1 mmol/L 3.7  Chloride 98 - 111 mmol/L 105  CO2 22 - 32 mmol/L 25  Calcium 8.9 - 10.3 mg/dL 8.8(L)  Total Protein 6.5 - 8.1 g/dL 7.0  Total Bilirubin 0.3 - 1.2 mg/dL 1.4(H)  Alkaline Phos 38 - 126 U/L 44  AST 15 - 41 U/L 22  ALT 0 - 44 U/L 19   CBC Latest Ref Rng & Units 10/17/2020  WBC 4.0 - 10.5 K/uL 5.7  Hemoglobin 12.0 - 15.0 g/dL 14.1  Hematocrit 36.0 - 46.0 % 41.7  Platelets 150 - 400 K/uL 195     Assessment and plan- Patient is a 70 y.o. female with history of ER/PR negative HER2 positive right breast cancer in 2016 s/p lumpectomy, adjuvant chemotherapy and radiation treatment and this is a routine follow-up visit  It has been over 5 years since patient's breast cancer diagnosis.  However patient would  like to continue to follow-up with oncology on a yearly basis.  She gets her mammograms in August every year.  She is overdue for 1 and we will go ahead and schedule it now.  I will see her back in 6 months for a breast exam and then following that I will see her on a yearly basis.  No role for routine tumor marker testing at this time   Visit Diagnosis 1. Encounter for follow-up surveillance of breast cancer  Dr. Randa Evens, MD, MPH [facility] [phone] 10/18/2020 2:24 PM

## 2020-11-01 ENCOUNTER — Other Ambulatory Visit: Payer: Self-pay

## 2020-11-01 ENCOUNTER — Ambulatory Visit
Admission: RE | Admit: 2020-11-01 | Discharge: 2020-11-01 | Disposition: A | Discharge status: Home / Self Care | Payer: Medicare HMO | Source: Ambulatory Visit | Attending: Oncology | Admitting: Oncology

## 2020-11-01 DIAGNOSIS — Z08 Encounter for follow-up examination after completed treatment for malignant neoplasm: Secondary | ICD-10-CM

## 2020-11-01 DIAGNOSIS — Z1231 Encounter for screening mammogram for malignant neoplasm of breast: Secondary | ICD-10-CM | POA: Diagnosis not present

## 2020-11-01 DIAGNOSIS — Z853 Personal history of malignant neoplasm of breast: Secondary | ICD-10-CM | POA: Diagnosis present

## 2020-11-02 ENCOUNTER — Other Ambulatory Visit: Payer: Self-pay

## 2020-11-02 DIAGNOSIS — N63 Unspecified lump in unspecified breast: Secondary | ICD-10-CM

## 2020-11-06 ENCOUNTER — Telehealth: Payer: Self-pay | Admitting: Oncology

## 2020-11-06 NOTE — Telephone Encounter (Signed)
Pt called and stated she got a mammo done last Thursday. She got results back in [place] and said she received a message that our office would call her to schedule an appt to get another mammo done due to finding something on her right breast. I do not see any notes to schedule anything, how would you like to proceed?

## 2020-11-06 NOTE — Telephone Encounter (Signed)
Can we please make usg gets scheduled asap?

## 2020-11-08 ENCOUNTER — Ambulatory Visit
Admission: RE | Admit: 2020-11-08 | Discharge: 2020-11-08 | Disposition: A | Discharge status: Home / Self Care | Payer: Medicare HMO | Source: Ambulatory Visit | Attending: Oncology | Admitting: Oncology

## 2020-11-08 ENCOUNTER — Other Ambulatory Visit: Payer: Self-pay

## 2020-11-08 ENCOUNTER — Other Ambulatory Visit: Payer: Self-pay | Admitting: Oncology

## 2020-11-08 DIAGNOSIS — N63 Unspecified lump in unspecified breast: Secondary | ICD-10-CM | POA: Diagnosis not present

## 2020-11-08 DIAGNOSIS — N6310 Unspecified lump in the right breast, unspecified quadrant: Secondary | ICD-10-CM

## 2020-11-08 DIAGNOSIS — R928 Other abnormal and inconclusive findings on diagnostic imaging of breast: Secondary | ICD-10-CM

## 2020-11-14 ENCOUNTER — Other Ambulatory Visit: Payer: Self-pay | Admitting: Oncology

## 2020-11-14 ENCOUNTER — Ambulatory Visit
Admission: RE | Admit: 2020-11-14 | Discharge: 2020-11-14 | Disposition: A | Discharge status: Home / Self Care | Payer: Medicare HMO | Source: Ambulatory Visit | Attending: Oncology | Admitting: Oncology

## 2020-11-14 ENCOUNTER — Other Ambulatory Visit: Payer: Self-pay

## 2020-11-14 DIAGNOSIS — R928 Other abnormal and inconclusive findings on diagnostic imaging of breast: Secondary | ICD-10-CM

## 2020-11-14 DIAGNOSIS — N6310 Unspecified lump in the right breast, unspecified quadrant: Secondary | ICD-10-CM | POA: Insufficient documentation

## 2020-11-14 HISTORY — PX: BREAST CYST ASPIRATION: SHX578

## 2020-11-15 ENCOUNTER — Other Ambulatory Visit: Payer: Self-pay | Admitting: *Deleted

## 2020-11-15 ENCOUNTER — Telehealth: Payer: Self-pay | Admitting: *Deleted

## 2020-11-15 DIAGNOSIS — C50912 Malignant neoplasm of unspecified site of left female breast: Secondary | ICD-10-CM

## 2020-11-15 DIAGNOSIS — N63 Unspecified lump in unspecified breast: Secondary | ICD-10-CM

## 2020-11-15 DIAGNOSIS — Z171 Estrogen receptor negative status [ER-]: Secondary | ICD-10-CM

## 2020-11-15 NOTE — Telephone Encounter (Signed)
Dr. Janese Banks had asked me to call the patient due to her ultrasound-guided biopsy of the right breast was performed on 9 /7.  The breast radiologist went into the area where the mammogram was showing something on the right breast and the mass collapsed after aspiration of a benign appearance. Md had done a mammogram after procedure and said a tiny residual mass was notes. Rec: 6 months mammogram and [place].  Dr. Janese Banks agrees with the plan and our scheduler will be making you a 18-monthappointment for a mammogram and ultrasound and we will see you after that like 1 to 2 days.  Patient that if she feels anything or has any pain then she can call [place] between .  If patient has any questions she is more than welcome to give [place] call at the direct telephone number of [phone]  This message was through her voicemail because she did not answer the phone at the time of call.

## 2021-02-21 ENCOUNTER — Telehealth: Payer: Self-pay | Admitting: Oncology

## 2021-02-21 NOTE — Telephone Encounter (Signed)
Pt called to set up appt for port flush. Pt states that it has been a while. Call back at [phone]

## 2021-02-27 ENCOUNTER — Other Ambulatory Visit: Payer: Self-pay

## 2021-02-27 ENCOUNTER — Inpatient Hospital Stay: Payer: Medicare HMO | Attending: Oncology

## 2021-02-27 DIAGNOSIS — Z95828 Presence of other vascular implants and grafts: Secondary | ICD-10-CM

## 2021-02-27 DIAGNOSIS — Z452 Encounter for adjustment and management of vascular access device: Secondary | ICD-10-CM | POA: Diagnosis present

## 2021-02-27 DIAGNOSIS — C50912 Malignant neoplasm of unspecified site of left female breast: Secondary | ICD-10-CM | POA: Diagnosis not present

## 2021-02-27 DIAGNOSIS — Z171 Estrogen receptor negative status [ER-]: Secondary | ICD-10-CM | POA: Diagnosis not present

## 2021-02-27 MED ORDER — HEPARIN SOD (PORK) LOCK FLUSH 100 UNIT/ML IV SOLN
500.0000 [IU] | Freq: Once | INTRAVENOUS | Status: AC
  Administered 2021-02-27: 14:00:00 500 [IU] via INTRAVENOUS
  Filled 2021-02-27: qty 5

## 2021-02-27 MED ORDER — SODIUM CHLORIDE 0.9% FLUSH
10.0000 mL | Freq: Once | INTRAVENOUS | Status: AC
  Administered 2021-02-27: 14:00:00 10 mL via INTRAVENOUS
  Filled 2021-02-27: qty 10

## 2021-04-24 ENCOUNTER — Ambulatory Visit: Payer: Medicare HMO | Admitting: Oncology

## 2021-04-29 ENCOUNTER — Encounter: Payer: Self-pay | Admitting: Oncology

## 2021-04-29 ENCOUNTER — Ambulatory Visit: Payer: Medicare HMO | Admitting: Oncology

## 2021-04-29 ENCOUNTER — Other Ambulatory Visit: Payer: Self-pay

## 2021-04-29 ENCOUNTER — Inpatient Hospital Stay: Payer: Medicare HMO | Attending: Oncology

## 2021-04-29 ENCOUNTER — Inpatient Hospital Stay: Payer: Medicare HMO | Admitting: Oncology

## 2021-04-29 VITALS — BP 144/80 | HR 92 | Temp 98.7°F | Resp 19 | Wt 235.7 lb

## 2021-04-29 DIAGNOSIS — Z923 Personal history of irradiation: Secondary | ICD-10-CM | POA: Diagnosis not present

## 2021-04-29 DIAGNOSIS — Z79899 Other long term (current) drug therapy: Secondary | ICD-10-CM | POA: Insufficient documentation

## 2021-04-29 DIAGNOSIS — Z7951 Long term (current) use of inhaled steroids: Secondary | ICD-10-CM | POA: Insufficient documentation

## 2021-04-29 DIAGNOSIS — Z9221 Personal history of antineoplastic chemotherapy: Secondary | ICD-10-CM | POA: Insufficient documentation

## 2021-04-29 DIAGNOSIS — Z08 Encounter for follow-up examination after completed treatment for malignant neoplasm: Secondary | ICD-10-CM | POA: Diagnosis not present

## 2021-04-29 DIAGNOSIS — Z885 Allergy status to narcotic agent status: Secondary | ICD-10-CM | POA: Insufficient documentation

## 2021-04-29 DIAGNOSIS — N183 Chronic kidney disease, stage 3 unspecified: Secondary | ICD-10-CM | POA: Insufficient documentation

## 2021-04-29 DIAGNOSIS — Z452 Encounter for adjustment and management of vascular access device: Secondary | ICD-10-CM | POA: Insufficient documentation

## 2021-04-29 DIAGNOSIS — Z803 Family history of malignant neoplasm of breast: Secondary | ICD-10-CM | POA: Diagnosis not present

## 2021-04-29 DIAGNOSIS — Z853 Personal history of malignant neoplasm of breast: Secondary | ICD-10-CM | POA: Insufficient documentation

## 2021-04-29 DIAGNOSIS — C50912 Malignant neoplasm of unspecified site of left female breast: Secondary | ICD-10-CM

## 2021-04-29 DIAGNOSIS — Z9049 Acquired absence of other specified parts of digestive tract: Secondary | ICD-10-CM | POA: Diagnosis not present

## 2021-04-29 DIAGNOSIS — Z95828 Presence of other vascular implants and grafts: Secondary | ICD-10-CM

## 2021-04-29 DIAGNOSIS — Z882 Allergy status to sulfonamides status: Secondary | ICD-10-CM | POA: Insufficient documentation

## 2021-04-29 DIAGNOSIS — Z90721 Acquired absence of ovaries, unilateral: Secondary | ICD-10-CM | POA: Diagnosis not present

## 2021-04-29 DIAGNOSIS — M25561 Pain in right knee: Secondary | ICD-10-CM | POA: Insufficient documentation

## 2021-04-29 MED ORDER — SODIUM CHLORIDE 0.9% FLUSH
10.0000 mL | Freq: Once | INTRAVENOUS | Status: AC
  Administered 2021-04-29: 10 mL via INTRAVENOUS
  Filled 2021-04-29: qty 10

## 2021-04-29 MED ORDER — HEPARIN SOD (PORK) LOCK FLUSH 100 UNIT/ML IV SOLN
500.0000 [IU] | Freq: Once | INTRAVENOUS | Status: AC
  Administered 2021-04-29: 500 [IU] via INTRAVENOUS
  Filled 2021-04-29: qty 5

## 2021-04-29 NOTE — Progress Notes (Signed)
Hematology/Oncology Consult note [facility]  [phone] [phone]  Patient Care Team: Sharyne Peach, MD as PCP - General (Family Medicine) Bary Castilla Forest Gleason, MD as Consulting Physician (General Surgery) Nicholaus Bloom, MD as Consulting Physician (Plastic Surgery) Serita Butcher, FNP (Family Medicine) Theodore Demark, RN as Oncology Nurse Navigator   Name of the patient: Madison Christensen  [id number]  [birth year]   Date of visit: 04/29/21  Diagnosis- history of stage I HER2 positive left breast cancer in 2016  Chief complaint/ Reason for visit-routine follow-up of breast cancer  Heme/Onc history: Madison Christensen is a 71 y.o. female with stage IA Her2/neu + left breast cancer status wide excision on  08/03/2014.  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  Lymphvascular invasion was indeterminate (+ on original biopsy).  DCIS was present.  Three sentinel lymph nodes were negative.  Tumor was ER -, PR -, and Her2/neu 3+ by IHC.  Pathologic stage was T1cN0M0.   She received adjuvant Taxol and Herceptin.  She received 10 of 12 weeks of Taxol (09/04/2014 - 11/06/2014).  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  She received Herceptin x 6 months (09/04/2014 - 03/27/2015).  Herceptin was discontinued secondary to myalgias.   She completed radiation therapy in 02/2015.      Interval history-presently reports pain in her right knee but otherwise doing well and denies other complaints at this time.Denies any breast concerns.  ECOG PS- 1 Pain scale- 0   Review of systems- Review of Systems  Constitutional:  Negative for chills, fever, malaise/fatigue and weight loss.  HENT:  Negative for congestion, ear discharge and nosebleeds.   Eyes:  Negative for blurred vision.  Respiratory:  Negative for cough, hemoptysis, sputum production, shortness of breath and wheezing.   Cardiovascular:  Negative  for chest pain, palpitations, orthopnea and claudication.  Gastrointestinal:  Negative for abdominal pain, blood in stool, constipation, diarrhea, heartburn, melena, nausea and vomiting.  Genitourinary:  Negative for dysuria, flank pain, frequency, hematuria and urgency.  Musculoskeletal:  Positive for joint pain. Negative for back pain and myalgias.  Skin:  Negative for rash.  Neurological:  Negative for dizziness, tingling, focal weakness, seizures, weakness and headaches.  Endo/Heme/Allergies:  Does not bruise/bleed easily.  Psychiatric/Behavioral:  Negative for depression and suicidal ideas. The patient does not have insomnia.      Allergies  Allergen Reactions   Codeine Other (See Comments)    Severe constipation   Losartan Other (See Comments)    Sick on stomach   Lovastatin Other (See Comments)    Per pt headache   Sulfa Antibiotics Hives and Rash     Past Medical History:  Diagnosis Date   Breast cancer ([place]) 2016   left   Cancer ([place])    skin    Carcinoma of left breast ([place]) 07/30/2014   1.4 cm, T1c, N0, ER negative, PR negative, HER-2 positive   Chronic kidney disease    h/o renal insuff   Complication of anesthesia 2004   oversensitive to noise, lights   COPD (chronic obstructive pulmonary disease) ([place])    Depression 1995   GERD (gastroesophageal reflux disease)    Hyperlipidemia    Hypertension    Nausea    Personal history of chemotherapy    Personal history of radiation therapy 2016   LEFT lumpectomy     Past Surgical History:  Procedure Laterality Date   ABDOMINAL HYSTERECTOMY  AXILLARY LYMPH NODE BIOPSY Left 08/03/2014   Procedure: AXILLARY LYMPH NODE BIOPSY;  Surgeon: Robert Bellow, MD;  Location: [facility] ORS;  Service: General;  Laterality: Left;   BACK SURGERY     BREAST BIOPSY Left    BREAST CYST ASPIRATION Right 11/14/2020   u/s asp 9:00 retro   BREAST LUMPECTOMY Left 2016   INVASIVE DUCTAL [facility]CINOMA & [facility] neg margins    BREAST  LUMPECTOMY WITH AXILLARY LYMPH NODE DISSECTION Left 08/03/2014   Procedure: BREAST LUMPECTOMY WITH AXILLARY LYMPH NODE DISSECTION;  Surgeon: Robert Bellow, MD;  Location: [facility] ORS;  Service: General;  Laterality: Left;   BREAST SURGERY Left 08/03/2014   Wide excision, sentinel node biopsy.   CHOLECYSTECTOMY     COLONOSCOPY  2013   Dr. Vira Agar   COLONOSCOPY WITH PROPOFOL N/A 05/30/2015   Procedure: COLONOSCOPY WITH PROPOFOL;  Surgeon: Manya Silvas, MD;  Location: [facility] ENDOSCOPY;  Service: Endoscopy;  Laterality: N/A;   DILATION AND CURETTAGE OF UTERUS N/A 01/16/2017   Procedure: DILATATION AND CURETTAGE;  Surgeon: Ward, Honor Loh, MD;  Location: [facility] OR[facility];  Service: Gynecology;  Laterality: N/A;   ESOPHAGOGASTRODUODENOSCOPY (EGD) WITH PROPOFOL N/A 10/28/2019   Procedure: ESOPHAGOGASTRODUODENOSCOPY (EGD) WITH PROPOFOL;  Surgeon: Lesly Rubenstein, MD;  Location: [facility] ENDOSCOPY;  Service: Endoscopy;  Laterality: N/A;   LAPAROSCOPIC BILATERAL SALPINGO OOPHERECTOMY Bilateral 02/06/2017   Procedure: LAPAROSCOPIC BILATERAL SALPINGO OOPHORECTOMY;  Surgeon: Ward, Honor Loh, MD;  Location: [facility] OR[facility];  Service: Gynecology;  Laterality: Bilateral;   LAPAROSCOPIC HYSTERECTOMY N/A 02/06/2017   Procedure: HYSTERECTOMY TOTAL LAPAROSCOPIC;  Surgeon: Ward, Honor Loh, MD;  Location: [facility] OR[facility];  Service: Gynecology;  Laterality: N/A;   OOPHORECTOMY     PORTACATH PLACEMENT Right 08/31/2014   Procedure: INSERTION PORT-A-CATH;  Surgeon: Robert Bellow, MD;  Location: [facility] OR[facility];  Service: General;  Laterality: Right;   REDUCTION MAMMAPLASTY Bilateral 2016   SENTINEL NODE BIOPSY Left 08/03/2014   Procedure: SENTINEL NODE BIOPSY;  Surgeon: Robert Bellow, MD;  Location: [facility] OR[facility];  Service: General;  Laterality: Left;   skin cancer  removal  2015   nose    TONSILLECTOMY      Social History   Socioeconomic History   Marital status: Widowed    Spouse name: Not on file   Number of children: Not on  file   Years of education: Not on file   Highest education level: Not on file  Occupational History   Not on file  Tobacco Use   Smoking status: Former    Packs/day: 1.00    Years: 2.00    Pack years: 2.00    Types: Cigarettes    Quit date: 07/30/1976    Years since quitting: 44.7   Smokeless tobacco: Never  Vaping Use   Vaping Use: Never used  Substance and Sexual Activity   Alcohol use: Yes    Alcohol/week: 0.0 - 2.0 standard drinks   Drug use: No   Sexual activity: Not on file  Other Topics Concern   Not on file  Social History Narrative   Not on file   Social Determinants of Health   Financial Resource Strain: Not on file  Food Insecurity: Not on file  Transportation Needs: Not on file  Physical Activity: Not on file  Stress: Not on file  Social Connections: Not on file  Intimate Partner Violence: Not on file    Family History  Problem Relation Age of Onset   Breast cancer Mother 48  Current Outpatient Medications:    acetaminophen (TYLENOL) 500 MG tablet, Take 500 mg by mouth every 6 (six) hours as needed., Disp: , Rfl:    albuterol (PROVENTIL HFA;VENTOLIN HFA) 108 (90 Base) MCG/ACT inhaler, Inhale 2 puffs into the lungs every 6 (six) hours as needed for wheezing or shortness of breath., Disp: 1 Inhaler, Rfl: 2   aspirin EC 81 MG tablet, Take 81 mg daily by mouth. , Disp: , Rfl:    cholecalciferol (VITAMIN D) 1000 units tablet, Take 1,000 Units by mouth daily., Disp: , Rfl:    citalopram (CELEXA) 20 MG tablet, TAKE 1 TABLET (20 MG TOTAL) BY MOUTH DAILY. (Patient taking differently: Take 30 mg by mouth daily.), Disp: 30 tablet, Rfl: 5   docusate sodium (COLACE) 100 MG capsule, Take 100 mg by mouth 2 (two) times daily., Disp: , Rfl:    fluticasone (FLONASE) 50 MCG/ACT nasal spray, Place into the nose., Disp: , Rfl:    fluticasone (FLOVENT HFA) 44 MCG/ACT inhaler, Inhale into the lungs., Disp: , Rfl:    gabapentin (NEURONTIN) 100 MG capsule, TAKE 1  CAPSULE BY MOUTH THREE TIMES DAILY (Patient taking differently: Take 100 mg by mouth 2 (two) times daily.), Disp: 90 capsule, Rfl: 1   magnesium oxide (MAG-OX) 400 MG tablet, Take 1 tablet by mouth 2 (two) times daily., Disp: , Rfl:    omeprazole (PRILOSEC) 20 MG capsule, Take 20 mg by mouth daily., Disp: , Rfl:    Polyethyl Glycol-Propyl Glycol 0.4-0.3 % SOLN, Place 1 drop into both eyes as needed (for dry eyes)., Disp: , Rfl:    pravastatin (PRAVACHOL) 40 MG tablet, Take 40 mg at bedtime by mouth. , Disp: , Rfl: 3   Probiotic Product (PROBIOTIC-10 PO), Take by mouth., Disp: , Rfl:    spironolactone (ALDACTONE) 25 MG tablet, Take 25 mg by mouth 2 (two) times daily. , Disp: , Rfl:    amoxicillin-clavulanate (AUGMENTIN) 875-125 MG tablet, Take 1 tablet by mouth 2 (two) times daily. (Patient not taking: Reported on 09/16/2018), Disp: 20 tablet, Rfl: 0   benzonatate (TESSALON) 200 MG capsule, TAKE 1 CAPSULE BY MOUTH THREE TIMES DAILY AS NEEDED FOR COUGH FOR UP TO 7 DAYS (Patient not taking: No sig reported), Disp: , Rfl:    chlorpheniramine-HYDROcodone (TUSSIONEX) 10-8 MG/5ML SUER, Take by mouth. (Patient not taking: Reported on 10/17/2020), Disp: , Rfl:    ipratropium (ATROVENT) 0.06 % nasal spray, Place into the nose. (Patient not taking: Reported on 04/29/2021), Disp: , Rfl:    levofloxacin (LEVAQUIN) 500 MG tablet, Take 500 mg by mouth daily. (Patient not taking: Reported on 10/17/2020), Disp: , Rfl:    methylPREDNISolone (MEDROL DOSEPAK) 4 MG TBPK tablet, See admin instructions. see package (Patient not taking: Reported on 10/05/2019), Disp: , Rfl:    Multiple Vitamin (MULTIVITAMIN) tablet, Take 2 tablets daily by mouth.  (Patient not taking: Reported on 04/29/2021), Disp: , Rfl:    NON FORMULARY, Apply 1 application 2 (two) times daily topically. Unscented Free-up Professional Massage Cream Mixed with Maxie Barb (Patient not taking: Reported on 04/29/2021), Disp: , Rfl:    ondansetron (ZOFRAN-ODT) 4 MG  disintegrating tablet, Take 4 mg by mouth every 8 (eight) hours as needed. (Patient not taking: Reported on 10/17/2020), Disp: , Rfl:    predniSONE (DELTASONE) 20 MG tablet, Take 40 mg by mouth daily. (Patient not taking: Reported on 10/17/2020), Disp: , Rfl:    promethazine (PHENERGAN) 12.5 MG tablet, Take 12.5 mg by mouth every 6 (six) hours as needed  for nausea or vomiting. (Patient not taking: Reported on 10/17/2020), Disp: , Rfl:    promethazine-dextromethorphan (PROMETHAZINE-DM) 6.25-15 MG/5ML syrup, Take by mouth. (Patient not taking: Reported on 10/17/2020), Disp: , Rfl:  No current facility-administered medications for this visit.  Facility-Administered Medications Ordered in Other Visits:    0.9 %  sodium chloride infusion, , Intravenous, Continuous, Manya Silvas, MD   diphenhydrAMINE (BENADRYL) capsule 25 mg, 25 mg, Oral, Once, Choksi, Janak, MD   sodium chloride 0.9 % injection 10 mL, 10 mL, Intravenous, PRN, Choksi, Janak, MD, 10 mL at 09/25/14 1418   sodium chloride 0.9 % injection 10 mL, 10 mL, Intravenous, PRN, Choksi, Janak, MD, 10 mL at 01/15/15 1325   sodium chloride flush (NS) 0.9 % injection 10 mL, 10 mL, Intravenous, PRN, Mike Gip, Melissa C, MD, 10 mL at 07/22/16 1011  Physical exam:  Vitals:   04/29/21 1358  BP: (!) 144/80  Pulse: 92  Resp: 19  Temp: 98.7 F (37.1 C)  SpO2: 97%  Weight: 235 lb 11.2 oz (106.9 kg)   Physical Exam Constitutional:      General: She is not in acute distress. Cardiovascular:     Rate and Rhythm: Normal rate and regular rhythm.     Heart sounds: Normal heart sounds.  Pulmonary:     Effort: Pulmonary effort is normal.     Breath sounds: Normal breath sounds.  Abdominal:     General: Bowel sounds are normal.     Palpations: Abdomen is soft.  Skin:    General: Skin is warm and dry.  Neurological:     Mental Status: She is alert and oriented to person, place, and time.   Breast exam was performed in seated and lying down  position. Patient is status post right lumpectomy with a well-healed surgical scar. No evidence of any palpable masses. No evidence of axillary adenopathy. No evidence of any palpable masses or lumps in the left breast. No evidence of leftt axillary adenopathy   CMP Latest Ref Rng & Units 10/17/2020  Glucose 70 - 99 mg/dL 110(H)  BUN 8 - 23 mg/dL 14  Creatinine 0.44 - 1.00 mg/dL 0.92  Sodium 135 - 145 mmol/L 136  Potassium 3.5 - 5.1 mmol/L 3.7  Chloride 98 - 111 mmol/L 105  CO2 22 - 32 mmol/L 25  Calcium 8.9 - 10.3 mg/dL 8.8(L)  Total Protein 6.5 - 8.1 g/dL 7.0  Total Bilirubin 0.3 - 1.2 mg/dL 1.4(H)  Alkaline Phos 38 - 126 U/L 44  AST 15 - 41 U/L 22  ALT 0 - 44 U/L 19   CBC Latest Ref Rng & Units 10/17/2020  WBC 4.0 - 10.5 K/uL 5.7  Hemoglobin 12.0 - 15.0 g/dL 14.1  Hematocrit 36.0 - 46.0 % 41.7  Platelets 150 - 400 K/uL 195     Assessment and plan- Patient is a 71 y.o. female with history of ER/PR negative HER2 positive right breast cancer in 2016 s/p lumpectomy, adjuvant chemotherapy and radiation treatment.  This is a routine follow-up visit for breast cancer  Patient is more than 5 years out of her breast cancer diagnosis but sees on a yearly basis for breast exams.She had a recent mammogram in August 2022 which showed possible concerning findings in the right breast and a right breast mass.  Biopsy was attempted but this was consistent with benign appearing cyst that was aspirated.will repeat mammogram on the right side at this time followed by routine mammogram in august 2023  I will see  her back in 1 year     Visit Diagnosis 1. Encounter for follow-up surveillance of breast cancer      Dr. Randa Evens, MD, MPH [facility] at [facility] [phone] 04/29/2021 4:50 PM

## 2021-04-29 NOTE — Progress Notes (Signed)
Patient state she gets pain in her joints especially the right leg.

## 2021-05-21 ENCOUNTER — Other Ambulatory Visit: Payer: Self-pay

## 2021-05-21 ENCOUNTER — Ambulatory Visit
Admission: RE | Admit: 2021-05-21 | Discharge: 2021-05-21 | Disposition: A | Discharge status: Home / Self Care | Payer: Medicare HMO | Source: Ambulatory Visit | Attending: Oncology | Admitting: Oncology

## 2021-05-21 DIAGNOSIS — Z171 Estrogen receptor negative status [ER-]: Secondary | ICD-10-CM | POA: Insufficient documentation

## 2021-05-21 DIAGNOSIS — N63 Unspecified lump in unspecified breast: Secondary | ICD-10-CM

## 2021-05-21 DIAGNOSIS — R928 Other abnormal and inconclusive findings on diagnostic imaging of breast: Secondary | ICD-10-CM | POA: Diagnosis not present

## 2021-05-21 DIAGNOSIS — Z1239 Encounter for other screening for malignant neoplasm of breast: Secondary | ICD-10-CM | POA: Insufficient documentation

## 2021-05-21 DIAGNOSIS — Z853 Personal history of malignant neoplasm of breast: Secondary | ICD-10-CM | POA: Diagnosis not present

## 2021-05-21 DIAGNOSIS — C50912 Malignant neoplasm of unspecified site of left female breast: Secondary | ICD-10-CM | POA: Insufficient documentation

## 2021-05-27 ENCOUNTER — Other Ambulatory Visit: Payer: Self-pay

## 2021-05-27 DIAGNOSIS — N6310 Unspecified lump in the right breast, unspecified quadrant: Secondary | ICD-10-CM

## 2021-05-28 ENCOUNTER — Other Ambulatory Visit: Payer: Self-pay | Admitting: *Deleted

## 2021-05-28 DIAGNOSIS — N6310 Unspecified lump in the right breast, unspecified quadrant: Secondary | ICD-10-CM

## 2021-07-22 ENCOUNTER — Inpatient Hospital Stay: Payer: Medicare HMO

## 2021-10-07 ENCOUNTER — Inpatient Hospital Stay: Payer: Medicare HMO | Attending: Oncology

## 2021-10-07 DIAGNOSIS — Z452 Encounter for adjustment and management of vascular access device: Secondary | ICD-10-CM | POA: Insufficient documentation

## 2021-10-07 DIAGNOSIS — Z9221 Personal history of antineoplastic chemotherapy: Secondary | ICD-10-CM | POA: Diagnosis not present

## 2021-10-07 DIAGNOSIS — Z95828 Presence of other vascular implants and grafts: Secondary | ICD-10-CM

## 2021-10-07 DIAGNOSIS — Z853 Personal history of malignant neoplasm of breast: Secondary | ICD-10-CM | POA: Insufficient documentation

## 2021-10-07 DIAGNOSIS — Z923 Personal history of irradiation: Secondary | ICD-10-CM | POA: Diagnosis not present

## 2021-10-07 DIAGNOSIS — Z87891 Personal history of nicotine dependence: Secondary | ICD-10-CM | POA: Diagnosis not present

## 2021-10-07 MED ORDER — HEPARIN SOD (PORK) LOCK FLUSH 100 UNIT/ML IV SOLN
500.0000 [IU] | Freq: Once | INTRAVENOUS | Status: AC
  Administered 2021-10-07: 500 [IU] via INTRAVENOUS
  Filled 2021-10-07: qty 5

## 2021-10-07 MED ORDER — SODIUM CHLORIDE 0.9% FLUSH
10.0000 mL | Freq: Once | INTRAVENOUS | Status: AC
  Administered 2021-10-07: 10 mL via INTRAVENOUS
  Filled 2021-10-07: qty 10

## 2021-11-22 ENCOUNTER — Other Ambulatory Visit: Payer: Medicare HMO

## 2021-11-22 ENCOUNTER — Inpatient Hospital Stay: Admission: RE | Admit: 2021-11-22 | Payer: Medicare HMO | Source: Ambulatory Visit

## 2021-12-11 ENCOUNTER — Ambulatory Visit
Admission: RE | Admit: 2021-12-11 | Discharge: 2021-12-11 | Disposition: A | Discharge status: Home / Self Care | Payer: Medicare HMO | Source: Ambulatory Visit | Attending: Oncology | Admitting: Oncology

## 2021-12-11 DIAGNOSIS — N6310 Unspecified lump in the right breast, unspecified quadrant: Secondary | ICD-10-CM | POA: Insufficient documentation

## 2021-12-11 DIAGNOSIS — N6001 Solitary cyst of right breast: Secondary | ICD-10-CM | POA: Insufficient documentation

## 2021-12-12 ENCOUNTER — Other Ambulatory Visit: Payer: Self-pay | Admitting: Oncology

## 2021-12-12 DIAGNOSIS — N63 Unspecified lump in unspecified breast: Secondary | ICD-10-CM

## 2021-12-12 DIAGNOSIS — R928 Other abnormal and inconclusive findings on diagnostic imaging of breast: Secondary | ICD-10-CM

## 2021-12-23 ENCOUNTER — Telehealth: Payer: Self-pay | Admitting: *Deleted

## 2021-12-23 ENCOUNTER — Inpatient Hospital Stay: Payer: Medicare HMO | Attending: Oncology

## 2021-12-23 NOTE — Telephone Encounter (Signed)
Pt had abnormal scan and was told she will get bx. The appt has been scheduled for 10/24 Per York Cerise in mammogram was reaching out to pt

## 2021-12-31 ENCOUNTER — Ambulatory Visit
Admission: RE | Admit: 2021-12-31 | Discharge: 2021-12-31 | Disposition: A | Discharge status: Home / Self Care | Payer: Medicare HMO | Source: Ambulatory Visit | Attending: Oncology | Admitting: Oncology

## 2021-12-31 DIAGNOSIS — N63 Unspecified lump in unspecified breast: Secondary | ICD-10-CM | POA: Insufficient documentation

## 2021-12-31 DIAGNOSIS — R928 Other abnormal and inconclusive findings on diagnostic imaging of breast: Secondary | ICD-10-CM

## 2021-12-31 HISTORY — PX: BREAST BIOPSY: SHX20

## 2021-12-31 MED ORDER — LIDOCAINE HCL (PF) 1 % IJ SOLN
3.0000 mL | Freq: Once | INTRAMUSCULAR | Status: AC
  Administered 2021-12-31: 3 mL via INTRADERMAL

## 2021-12-31 MED ORDER — LIDOCAINE-EPINEPHRINE 1 %-1:100000 IJ SOLN
3.0000 mL | Freq: Once | INTRAMUSCULAR | Status: AC
  Administered 2021-12-31: 3 mL via INTRADERMAL

## 2022-01-02 LAB — SURGICAL PATHOLOGY

## 2022-01-03 ENCOUNTER — Telehealth: Payer: Self-pay | Admitting: *Deleted

## 2022-01-03 NOTE — Telephone Encounter (Signed)
Called pt and left message that the biopsy she had did not show any cancer at all. If she has questions please call

## 2022-01-06 ENCOUNTER — Other Ambulatory Visit: Payer: Self-pay | Admitting: Oncology

## 2022-01-06 DIAGNOSIS — R928 Other abnormal and inconclusive findings on diagnostic imaging of breast: Secondary | ICD-10-CM

## 2022-01-06 DIAGNOSIS — N63 Unspecified lump in unspecified breast: Secondary | ICD-10-CM

## 2022-01-15 ENCOUNTER — Ambulatory Visit
Admission: RE | Admit: 2022-01-15 | Discharge: 2022-01-15 | Disposition: A | Discharge status: Home / Self Care | Payer: Medicare HMO | Source: Ambulatory Visit | Attending: Oncology | Admitting: Oncology

## 2022-01-15 DIAGNOSIS — N63 Unspecified lump in unspecified breast: Secondary | ICD-10-CM | POA: Diagnosis present

## 2022-01-15 DIAGNOSIS — R928 Other abnormal and inconclusive findings on diagnostic imaging of breast: Secondary | ICD-10-CM | POA: Insufficient documentation

## 2022-01-15 HISTORY — PX: BREAST BIOPSY: SHX20

## 2022-01-16 LAB — SURGICAL PATHOLOGY

## 2022-01-17 ENCOUNTER — Telehealth: Payer: Self-pay | Admitting: *Deleted

## 2022-01-17 NOTE — Telephone Encounter (Signed)
-----   Message from Sindy Guadeloupe, MD sent at 01/17/2022  8:38 AM EST ----- Please let her know breast biopsy results were benign

## 2022-01-17 NOTE — Telephone Encounter (Signed)
I called cell phone and no answer, so I left a message that she has good news that the biopsy was negative . Asked her to call me if she has any questions and number [phone]

## 2022-03-11 ENCOUNTER — Inpatient Hospital Stay: Payer: Medicare HMO | Attending: Oncology

## 2022-03-11 DIAGNOSIS — Z853 Personal history of malignant neoplasm of breast: Secondary | ICD-10-CM | POA: Insufficient documentation

## 2022-03-11 DIAGNOSIS — Z95828 Presence of other vascular implants and grafts: Secondary | ICD-10-CM

## 2022-03-11 DIAGNOSIS — Z452 Encounter for adjustment and management of vascular access device: Secondary | ICD-10-CM | POA: Diagnosis not present

## 2022-03-11 MED ORDER — HEPARIN SOD (PORK) LOCK FLUSH 100 UNIT/ML IV SOLN
500.0000 [IU] | Freq: Once | INTRAVENOUS | Status: AC
  Administered 2022-03-11: 500 [IU] via INTRAVENOUS
  Filled 2022-03-11: qty 5

## 2022-03-11 MED ORDER — SODIUM CHLORIDE 0.9% FLUSH
10.0000 mL | Freq: Once | INTRAVENOUS | Status: AC
  Administered 2022-03-11: 10 mL via INTRAVENOUS
  Filled 2022-03-11: qty 10

## 2022-04-29 ENCOUNTER — Inpatient Hospital Stay: Payer: Medicare HMO | Admitting: Oncology

## 2022-06-09 ENCOUNTER — Inpatient Hospital Stay: Payer: Medicare HMO | Admitting: Oncology

## 2022-06-23 ENCOUNTER — Inpatient Hospital Stay: Payer: Medicare HMO | Admitting: Oncology

## 2022-07-07 ENCOUNTER — Encounter: Payer: Self-pay | Admitting: Oncology

## 2022-07-07 ENCOUNTER — Inpatient Hospital Stay: Payer: Medicare HMO | Attending: Oncology | Admitting: Oncology

## 2022-07-07 ENCOUNTER — Telehealth: Payer: Self-pay | Admitting: *Deleted

## 2022-07-07 VITALS — BP 147/63 | HR 77 | Temp 97.5°F | Resp 18 | Ht 65.0 in | Wt 249.2 lb

## 2022-07-07 DIAGNOSIS — C50912 Malignant neoplasm of unspecified site of left female breast: Secondary | ICD-10-CM

## 2022-07-07 DIAGNOSIS — Z9221 Personal history of antineoplastic chemotherapy: Secondary | ICD-10-CM | POA: Insufficient documentation

## 2022-07-07 DIAGNOSIS — Z90710 Acquired absence of both cervix and uterus: Secondary | ICD-10-CM | POA: Insufficient documentation

## 2022-07-07 DIAGNOSIS — Z90721 Acquired absence of ovaries, unilateral: Secondary | ICD-10-CM | POA: Diagnosis not present

## 2022-07-07 DIAGNOSIS — Z171 Estrogen receptor negative status [ER-]: Secondary | ICD-10-CM

## 2022-07-07 DIAGNOSIS — Z87891 Personal history of nicotine dependence: Secondary | ICD-10-CM | POA: Diagnosis not present

## 2022-07-07 DIAGNOSIS — Z08 Encounter for follow-up examination after completed treatment for malignant neoplasm: Secondary | ICD-10-CM

## 2022-07-07 DIAGNOSIS — Z803 Family history of malignant neoplasm of breast: Secondary | ICD-10-CM | POA: Insufficient documentation

## 2022-07-07 DIAGNOSIS — Z9049 Acquired absence of other specified parts of digestive tract: Secondary | ICD-10-CM | POA: Insufficient documentation

## 2022-07-07 DIAGNOSIS — Z923 Personal history of irradiation: Secondary | ICD-10-CM | POA: Insufficient documentation

## 2022-07-07 DIAGNOSIS — E785 Hyperlipidemia, unspecified: Secondary | ICD-10-CM | POA: Diagnosis not present

## 2022-07-07 DIAGNOSIS — N6310 Unspecified lump in the right breast, unspecified quadrant: Secondary | ICD-10-CM | POA: Diagnosis not present

## 2022-07-07 DIAGNOSIS — Z885 Allergy status to narcotic agent status: Secondary | ICD-10-CM | POA: Insufficient documentation

## 2022-07-07 DIAGNOSIS — Z882 Allergy status to sulfonamides status: Secondary | ICD-10-CM | POA: Diagnosis not present

## 2022-07-07 DIAGNOSIS — Z79899 Other long term (current) drug therapy: Secondary | ICD-10-CM | POA: Diagnosis not present

## 2022-07-07 DIAGNOSIS — Z853 Personal history of malignant neoplasm of breast: Secondary | ICD-10-CM

## 2022-07-07 NOTE — Progress Notes (Signed)
Hematology/Oncology Consult note [facility]  [phone] [phone]  Patient Care Team: Rayetta Humphrey, MD as PCP - General (Family Medicine) Lemar Livings, Merrily Pew, MD as Consulting Physician (General Surgery) Caryl Asp, MD as Consulting Physician (Plastic Surgery) Mack Hook, FNP (Family Medicine) Scarlett Presto, RN (Inactive) as Oncology Nurse Navigator   Name of the patient: Madison Christensen  [id number]  1951-02-21   Date of visit: 07/07/22  Diagnosis-  history of stage I HER2 positive left breast cancer in 2016   Chief complaint/ Reason for visit-routine surveillance visit for breast cancer  Heme/Onc history: Madison Christensen is a 72 y.o. female with stage IA Her2/neu + left breast cancer status wide excision on  08/03/2014.  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  Lymphvascular invasion was indeterminate (+ on original biopsy).  DCIS was present.  Three sentinel lymph nodes were negative.  Tumor was ER -, PR -, and Her2/neu 3+ by IHC.  Pathologic stage was T1cN0M0.   She received adjuvant Taxol and Herceptin.  She received 10 of 12 weeks of Taxol (09/04/2014 - 11/06/2014).  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  She received Herceptin x 6 months (09/04/2014 - 03/27/2015).  Herceptin was discontinued secondary to myalgias.   She completed radiation therapy in 02/2015.  Interval history-patient recently had a suspicious skin lesion excised from her left arm by dermatology.  This was complicated by wound infection and is currently healing from that.  Denies any breast concerns  ECOG PS- 1 Pain scale- 0 Opioid associated constipation- no  Review of systems- Review of Systems  Constitutional:  Negative for chills, fever, malaise/fatigue and weight loss.  HENT:  Negative for congestion, ear discharge and nosebleeds.   Eyes:  Negative for blurred vision.  Respiratory:   Negative for cough, hemoptysis, sputum production, shortness of breath and wheezing.   Cardiovascular:  Negative for chest pain, palpitations, orthopnea and claudication.  Gastrointestinal:  Negative for abdominal pain, blood in stool, constipation, diarrhea, heartburn, melena, nausea and vomiting.  Genitourinary:  Negative for dysuria, flank pain, frequency, hematuria and urgency.  Musculoskeletal:  Negative for back pain, joint pain and myalgias.  Skin:  Negative for rash.  Neurological:  Negative for dizziness, tingling, focal weakness, seizures, weakness and headaches.  Endo/Heme/Allergies:  Does not bruise/bleed easily.  Psychiatric/Behavioral:  Negative for depression and suicidal ideas. The patient does not have insomnia.       Allergies  Allergen Reactions   Codeine Other (See Comments)    Severe constipation   Losartan Other (See Comments)    Sick on stomach   Lovastatin Other (See Comments)    Per pt headache   Sulfa Antibiotics Hives and Rash     Past Medical History:  Diagnosis Date   Breast cancer (HCC) 2016   left   Cancer (HCC)    skin    Carcinoma of left breast (HCC) 07/30/2014   1.4 cm, T1c, N0, ER negative, PR negative, HER-2 positive   Chronic kidney disease    h/o renal insuff   Complication of anesthesia 2004   oversensitive to noise, lights   COPD (chronic obstructive pulmonary disease) (HCC)    Depression 1995   GERD (gastroesophageal reflux disease)    Hyperlipidemia    Hypertension    Nausea    Personal history of chemotherapy    Personal history of radiation therapy 2016   LEFT lumpectomy     Past  Surgical History:  Procedure Laterality Date   ABDOMINAL HYSTERECTOMY     AXILLARY LYMPH NODE BIOPSY Left 08/03/2014   Procedure: AXILLARY LYMPH NODE BIOPSY;  Surgeon: Earline Mayotte, MD;  Location: [facility];  Service: General;  Laterality: Left;   BACK SURGERY     BREAST BIOPSY Left    BREAST BIOPSY Right 12/31/2021   u/s bx, 9:00 retro,  RIBBON (ribbon did not match/heart clip placed)-path pending   BREAST BIOPSY Right 01/15/2022   Right Breast Stereo Bx, X clip , path pending   BREAST BIOPSY Right 01/15/2022   MM RT BREAST BX W LOC DEV 1ST LESION IMAGE BX SPEC STEREO GUIDE 01/15/2022 [facility]   BREAST CYST ASPIRATION Right 11/14/2020   u/s asp 9:00 retro   BREAST LUMPECTOMY Left 2016   [facility] neg margins    BREAST LUMPECTOMY WITH AXILLARY LYMPH NODE DISSECTION Left 08/03/2014   Procedure: BREAST LUMPECTOMY WITH AXILLARY LYMPH NODE DISSECTION;  Surgeon: Earline Mayotte, MD;  Location: [facility];  Service: General;  Laterality: Left;   BREAST SURGERY Left 08/03/2014   Wide excision, sentinel node biopsy.   CHOLECYSTECTOMY     COLONOSCOPY  2013   Dr. Mechele Collin   COLONOSCOPY WITH PROPOFOL N/A 05/30/2015   Procedure: COLONOSCOPY WITH PROPOFOL;  Surgeon: Scot Jun, MD;  Location: [facility] ENDOSCOPY;  Service: Endoscopy;  Laterality: N/A;   DILATION AND CURETTAGE OF UTERUS N/A 01/16/2017   Procedure: DILATATION AND CURETTAGE;  Surgeon: Ward, Elenora Fender, MD;  Location: [facility];  Service: Gynecology;  Laterality: N/A;   ESOPHAGOGASTRODUODENOSCOPY (EGD) WITH PROPOFOL N/A 10/28/2019   Procedure: ESOPHAGOGASTRODUODENOSCOPY (EGD) WITH PROPOFOL;  Surgeon: Regis Bill, MD;  Location: [facility] ENDOSCOPY;  Service: Endoscopy;  Laterality: N/A;   LAPAROSCOPIC BILATERAL SALPINGO OOPHERECTOMY Bilateral 02/06/2017   Procedure: LAPAROSCOPIC BILATERAL SALPINGO OOPHORECTOMY;  Surgeon: Ward, Elenora Fender, MD;  Location: [facility];  Service: Gynecology;  Laterality: Bilateral;   LAPAROSCOPIC HYSTERECTOMY N/A 02/06/2017   Procedure: HYSTERECTOMY TOTAL LAPAROSCOPIC;  Surgeon: Ward, Elenora Fender, MD;  Location: [facility];  Service: Gynecology;  Laterality: N/A;   OOPHORECTOMY     PORTACATH PLACEMENT Right 08/31/2014   Procedure: INSERTION PORT-A-CATH;  Surgeon: Earline Mayotte, MD;  Location: [facility];  Service:  General;  Laterality: Right;   REDUCTION MAMMAPLASTY Bilateral 2016   SENTINEL NODE BIOPSY Left 08/03/2014   Procedure: SENTINEL NODE BIOPSY;  Surgeon: Earline Mayotte, MD;  Location: [facility];  Service: General;  Laterality: Left;   skin cancer  removal  2015   nose    TONSILLECTOMY      Social History   Socioeconomic History   Marital status: Widowed    Spouse name: Not on file   Number of children: Not on file   Years of education: Not on file   Highest education level: Not on file  Occupational History   Not on file  Tobacco Use   Smoking status: Former    Packs/day: 1.00    Years: 2.00    Additional pack years: 0.00    Total pack years: 2.00    Types: Cigarettes    Quit date: 07/30/1976    Years since quitting: 45.9   Smokeless tobacco: Never  Vaping Use   Vaping Use: Never used  Substance and Sexual Activity   Alcohol use: Yes    Alcohol/week: 0.0 - 2.0 standard drinks of alcohol   Drug use: No   Sexual activity: Not on file  Other Topics Concern  Not on file  Social History Narrative   Not on file   Social Determinants of Health   Financial Resource Strain: Not on file  Food Insecurity: Not on file  Transportation Needs: Not on file  Physical Activity: Not on file  Stress: Not on file  Social Connections: Not on file  Intimate Partner Violence: Not on file    Family History  Problem Relation Age of Onset   Breast cancer Mother 37             Current Outpatient Medications:    acetaminophen (TYLENOL) 500 MG tablet, Take 500 mg by mouth every 6 (six) hours as needed., Disp: , Rfl:    albuterol (PROVENTIL HFA;VENTOLIN HFA) 108 (90 Base) MCG/ACT inhaler, Inhale 2 puffs into the lungs every 6 (six) hours as needed for wheezing or shortness of breath., Disp: 1 Inhaler, Rfl: 2   amoxicillin-clavulanate (AUGMENTIN) 875-125 MG tablet, Take 1 tablet by mouth 2 (two) times daily., Disp: 20 tablet, Rfl: 0   ARNUITY ELLIPTA 100 MCG/ACT AEPB, Take 1 puff by  mouth 2 (two) times daily., Disp: , Rfl:    aspirin EC 81 MG tablet, Take 81 mg daily by mouth. , Disp: , Rfl:    cholecalciferol (VITAMIN D) 1000 units tablet, Take 1,000 Units by mouth daily., Disp: , Rfl:    docusate sodium (COLACE) 100 MG capsule, Take 100 mg by mouth 2 (two) times daily., Disp: , Rfl:    fluticasone (FLOVENT HFA) 44 MCG/ACT inhaler, Inhale into the lungs., Disp: , Rfl:    gabapentin (NEURONTIN) 100 MG capsule, TAKE 1 CAPSULE BY MOUTH THREE TIMES DAILY (Patient taking differently: Take 100 mg by mouth 2 (two) times daily.), Disp: 90 capsule, Rfl: 1   gabapentin (NEURONTIN) 100 MG capsule, Take by mouth., Disp: , Rfl:    magnesium oxide (MAG-OX) 400 MG tablet, Take 1 tablet by mouth 2 (two) times daily., Disp: , Rfl:    omeprazole (PRILOSEC) 20 MG capsule, Take 20 mg by mouth daily., Disp: , Rfl:    Polyethyl Glycol-Propyl Glycol 0.4-0.3 % SOLN, Place 1 drop into both eyes as needed (for dry eyes)., Disp: , Rfl:    pravastatin (PRAVACHOL) 40 MG tablet, Take 40 mg at bedtime by mouth. , Disp: , Rfl: 3   pravastatin (PRAVACHOL) 40 MG tablet, Take by mouth., Disp: , Rfl:    Probiotic Product (PROBIOTIC-10 PO), Take by mouth., Disp: , Rfl:    spironolactone (ALDACTONE) 25 MG tablet, Take 25 mg by mouth 2 (two) times daily. , Disp: , Rfl:    spironolactone (ALDACTONE) 25 MG tablet, Take 1 tablet by mouth 2 (two) times daily., Disp: , Rfl:    tiZANidine (ZANAFLEX) 2 MG tablet, Take by mouth., Disp: , Rfl:    benzonatate (TESSALON) 200 MG capsule, TAKE 1 CAPSULE BY MOUTH THREE TIMES DAILY AS NEEDED FOR COUGH FOR UP TO 7 DAYS (Patient not taking: No sig reported), Disp: , Rfl:    citalopram (CELEXA) 20 MG tablet, TAKE 1 TABLET (20 MG TOTAL) BY MOUTH DAILY. (Patient not taking: Reported on 07/07/2022), Disp: 30 tablet, Rfl: 5   fluticasone (FLOVENT HFA) 44 MCG/ACT inhaler, Inhale into the lungs., Disp: , Rfl:    HYDROcodone-acetaminophen (NORCO/VICODIN) 5-325 MG tablet, Take 1 tablet by  mouth every 6 (six) hours as needed. (Patient not taking: Reported on 07/07/2022), Disp: , Rfl:    levofloxacin (LEVAQUIN) 500 MG tablet, Take 500 mg by mouth daily. (Patient not taking: Reported on 10/17/2020), Disp: ,  Rfl:    Multiple Vitamin (MULTIVITAMIN) tablet, Take 2 tablets daily by mouth.  (Patient not taking: Reported on 04/29/2021), Disp: , Rfl:    NON FORMULARY, Apply 1 application 2 (two) times daily topically. Unscented Free-up Professional Massage Cream Mixed with Marry Guan (Patient not taking: Reported on 04/29/2021), Disp: , Rfl:    ondansetron (ZOFRAN-ODT) 4 MG disintegrating tablet, Take 4 mg by mouth every 8 (eight) hours as needed. (Patient not taking: Reported on 10/17/2020), Disp: , Rfl:    predniSONE (DELTASONE) 20 MG tablet, Take 40 mg by mouth daily. (Patient not taking: Reported on 10/17/2020), Disp: , Rfl:    promethazine-dextromethorphan (PROMETHAZINE-DM) 6.25-15 MG/5ML syrup, Take by mouth. (Patient not taking: Reported on 10/17/2020), Disp: , Rfl:  No current facility-administered medications for this visit.  Facility-Administered Medications Ordered in Other Visits:    0.9 %  sodium chloride infusion, , Intravenous, Continuous, Scot Jun, MD   diphenhydrAMINE (BENADRYL) capsule 25 mg, 25 mg, Oral, Once, Choksi, Janak, MD   sodium chloride 0.9 % injection 10 mL, 10 mL, Intravenous, PRN, Choksi, Janak, MD, 10 mL at 09/25/14 1418   sodium chloride 0.9 % injection 10 mL, 10 mL, Intravenous, PRN, Choksi, Janak, MD, 10 mL at 01/15/15 1325   sodium chloride flush (NS) 0.9 % injection 10 mL, 10 mL, Intravenous, PRN, Merlene Pulling, Melissa C, MD, 10 mL at 07/22/16 1011  Physical exam:  Vitals:   07/07/22 1505  BP: (!) 147/63  Pulse: 77  Resp: 18  Temp: (!) 97.5 F (36.4 C)  TempSrc: Tympanic  SpO2: 97%  Weight: 249 lb 3.2 oz (113 kg)  Height: 5\' 5"  (1.651 m)   Physical Exam Cardiovascular:     Rate and Rhythm: Normal rate and regular rhythm.     Heart sounds: Normal  heart sounds.  Pulmonary:     Effort: Pulmonary effort is normal.     Breath sounds: Normal breath sounds.  Skin:    General: Skin is warm and dry.  Neurological:     Mental Status: She is alert and oriented to person, place, and time.    Breast exam was performed in seated and lying down position. Patient is status post left lumpectomy with a well-healed surgical scar. No evidence of any palpable masses. No evidence of axillary adenopathy. No evidence of any palpable masses or lumps in the right breast. No evidence of right axillary adenopathy Right chest wall port in place     Latest Ref Rng & Units 10/17/2020   10:11 AM  CMP  Glucose 70 - 99 mg/dL 098   BUN 8 - 23 mg/dL 14   Creatinine 1.19 - 1.00 mg/dL 1.47   Sodium 829 - 562 mmol/L 136   Potassium 3.5 - 5.1 mmol/L 3.7   Chloride 98 - 111 mmol/L 105   CO2 22 - 32 mmol/L 25   Calcium 8.9 - 10.3 mg/dL 8.8   Total Protein 6.5 - 8.1 g/dL 7.0   Total Bilirubin 0.3 - 1.2 mg/dL 1.4   Alkaline Phos 38 - 126 U/L 44   AST 15 - 41 U/L 22   ALT 0 - 44 U/L 19       Latest Ref Rng & Units 10/17/2020   10:11 AM  CBC  WBC 4.0 - 10.5 K/uL 5.7   Hemoglobin 12.0 - 15.0 g/dL 13.0   Hematocrit 86.5 - 46.0 % 41.7   Platelets 150 - 400 K/uL 195     Assessment and plan- Patient is a 72  y.o. female with history of ER/PR negative HER2 positive right breast cancer in 2016 s/p lumpectomy, adjuvant chemotherapy and radiation treatment.  This is a routine surveillance visit for breast cancer  Patient was diagnosed with breast cancer back in 2016 and now we are more than 5 years out of her diagnosis.  Clinically she is doing well with no concerning signs and symptoms of recurrence based on today's exam.  I will see her back in 1 year.  Will schedule her mammogram for October 2024.  Her port can be taken out at this time and we will inform Dr. Tonna Boehringer or Dr. Maia Plan about it.   Visit Diagnosis 1. Encounter for follow-up surveillance of breast cancer       Dr. Owens Shark, MD, MPH [facility] [phone] 07/07/2022 3:48 PM

## 2022-07-07 NOTE — Addendum Note (Signed)
Addended by: Corene Cornea on: 07/07/2022 04:54 PM   Modules accepted: Orders

## 2022-07-07 NOTE — Telephone Encounter (Signed)
Faxed a form to get port removed. Asked the surgeon to call pt about the date / time to the pt.Marland Kitchen

## 2022-12-15 ENCOUNTER — Ambulatory Visit
Admission: RE | Admit: 2022-12-15 | Discharge: 2022-12-15 | Disposition: A | Discharge status: Home / Self Care | Payer: Medicare HMO | Source: Ambulatory Visit | Attending: Oncology | Admitting: Oncology

## 2022-12-15 DIAGNOSIS — Z171 Estrogen receptor negative status [ER-]: Secondary | ICD-10-CM | POA: Insufficient documentation

## 2022-12-15 DIAGNOSIS — Z1231 Encounter for screening mammogram for malignant neoplasm of breast: Secondary | ICD-10-CM | POA: Insufficient documentation

## 2022-12-15 DIAGNOSIS — C50912 Malignant neoplasm of unspecified site of left female breast: Secondary | ICD-10-CM | POA: Insufficient documentation

## 2023-05-01 ENCOUNTER — Other Ambulatory Visit: Payer: Self-pay | Admitting: Family Medicine

## 2023-05-01 DIAGNOSIS — Z780 Asymptomatic menopausal state: Secondary | ICD-10-CM

## 2023-07-07 ENCOUNTER — Inpatient Hospital Stay: Payer: Medicare (Managed Care) | Admitting: Oncology

## 2023-07-28 ENCOUNTER — Inpatient Hospital Stay: Payer: Medicare (Managed Care) | Admitting: Oncology

## 2023-08-17 ENCOUNTER — Encounter: Payer: Self-pay | Admitting: Oncology

## 2023-08-17 ENCOUNTER — Inpatient Hospital Stay: Payer: Medicare (Managed Care) | Attending: Oncology | Admitting: Oncology

## 2023-08-17 VITALS — BP 127/70 | HR 77 | Temp 97.4°F | Resp 17 | Wt 245.0 lb

## 2023-08-17 DIAGNOSIS — Z1722 Progesterone receptor negative status: Secondary | ICD-10-CM | POA: Insufficient documentation

## 2023-08-17 DIAGNOSIS — Z882 Allergy status to sulfonamides status: Secondary | ICD-10-CM | POA: Insufficient documentation

## 2023-08-17 DIAGNOSIS — Z7982 Long term (current) use of aspirin: Secondary | ICD-10-CM | POA: Insufficient documentation

## 2023-08-17 DIAGNOSIS — Z885 Allergy status to narcotic agent status: Secondary | ICD-10-CM | POA: Insufficient documentation

## 2023-08-17 DIAGNOSIS — Z853 Personal history of malignant neoplasm of breast: Secondary | ICD-10-CM

## 2023-08-17 DIAGNOSIS — Z08 Encounter for follow-up examination after completed treatment for malignant neoplasm: Secondary | ICD-10-CM | POA: Diagnosis not present

## 2023-08-17 DIAGNOSIS — Z90710 Acquired absence of both cervix and uterus: Secondary | ICD-10-CM | POA: Diagnosis not present

## 2023-08-17 DIAGNOSIS — Z90721 Acquired absence of ovaries, unilateral: Secondary | ICD-10-CM | POA: Diagnosis not present

## 2023-08-17 DIAGNOSIS — R109 Unspecified abdominal pain: Secondary | ICD-10-CM | POA: Insufficient documentation

## 2023-08-17 DIAGNOSIS — Z1731 Human epidermal growth factor receptor 2 positive status: Secondary | ICD-10-CM | POA: Insufficient documentation

## 2023-08-17 DIAGNOSIS — Z171 Estrogen receptor negative status [ER-]: Secondary | ICD-10-CM | POA: Diagnosis not present

## 2023-08-17 DIAGNOSIS — Z87891 Personal history of nicotine dependence: Secondary | ICD-10-CM | POA: Diagnosis not present

## 2023-08-17 DIAGNOSIS — R110 Nausea: Secondary | ICD-10-CM | POA: Diagnosis not present

## 2023-08-17 DIAGNOSIS — I10 Essential (primary) hypertension: Secondary | ICD-10-CM | POA: Diagnosis not present

## 2023-08-17 DIAGNOSIS — Z79899 Other long term (current) drug therapy: Secondary | ICD-10-CM | POA: Diagnosis not present

## 2023-08-17 DIAGNOSIS — C50911 Malignant neoplasm of unspecified site of right female breast: Secondary | ICD-10-CM | POA: Diagnosis present

## 2023-08-17 DIAGNOSIS — Z7951 Long term (current) use of inhaled steroids: Secondary | ICD-10-CM | POA: Diagnosis not present

## 2023-08-17 DIAGNOSIS — Z803 Family history of malignant neoplasm of breast: Secondary | ICD-10-CM | POA: Diagnosis not present

## 2023-08-17 DIAGNOSIS — Z9049 Acquired absence of other specified parts of digestive tract: Secondary | ICD-10-CM | POA: Diagnosis not present

## 2023-08-17 DIAGNOSIS — Z923 Personal history of irradiation: Secondary | ICD-10-CM | POA: Diagnosis not present

## 2023-08-17 DIAGNOSIS — Z9221 Personal history of antineoplastic chemotherapy: Secondary | ICD-10-CM | POA: Diagnosis not present

## 2023-08-17 NOTE — Progress Notes (Signed)
 Concerns of abdominal cramps and body aches

## 2023-08-17 NOTE — Progress Notes (Signed)
 Hematology/Oncology Consult note [facility]  Telephone:([phone] [phone]  Patient Care Team: Alexander Anes, MD as PCP - General (Family Medicine) Marquita Situ, Magali Schmitz, MD as Consulting Physician (General Surgery) Halina Lever, MD as Consulting Physician (Plastic Surgery) Riesa Chary, FNP (Inactive) (Family Medicine) Arlette Benders, RN (Inactive) as Oncology Nurse Navigator   Name of the patient: Madison Christensen  [id number]  12-17-1950   Date of visit: 08/17/23  Diagnosis-  history of stage I HER2 positive left breast cancer in 2016     Chief complaint/ Reason for visit-  routine surveillance visit for breast cancer  Heme/Onc history: Madison Christensen is a 73 y.o. female with stage IA Her2/neu + left breast cancer status wide excision on  08/03/2014.  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  Lymphvascular invasion was indeterminate (+ on original biopsy).  DCIS was present.  Three sentinel lymph nodes were negative.  Tumor was ER -, PR -, and Her2/neu 3+ by IHC.  Pathologic stage was T1cN0M0.   She received adjuvant Taxol  and Herceptin .  She received 10 of 12 weeks of Taxol  (09/04/2014 - 11/06/2014).  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  She received Herceptin  x 6 months (09/04/2014 - 03/27/2015).  Herceptin  was discontinued secondary to myalgias.   She completed radiation therapy in 02/2015.  She is presently in remission  Interval history-patient has had cyclical episodes of nausea and abdominal cramping which lasts for a few days and comes and goes.  Denies any changes in her appetite and weight.  Also reports that she had significant body aches which lasted for about a week and now are gradually getting better.  ECOG PS- 1 Pain scale- 3   Review of systems- Review of Systems  Constitutional:  Positive for malaise/fatigue. Negative for chills, fever and weight loss.  HENT:   Negative for congestion, ear discharge and nosebleeds.   Eyes:  Negative for blurred vision.  Respiratory:  Negative for cough, hemoptysis, sputum production, shortness of breath and wheezing.   Cardiovascular:  Negative for chest pain, palpitations, orthopnea and claudication.  Gastrointestinal:  Positive for abdominal pain and nausea. Negative for blood in stool, constipation, diarrhea, heartburn, melena and vomiting.  Genitourinary:  Negative for dysuria, flank pain, frequency, hematuria and urgency.  Musculoskeletal:  Positive for myalgias. Negative for back pain and joint pain.  Skin:  Negative for rash.  Neurological:  Negative for dizziness, tingling, focal weakness, seizures, weakness and headaches.  Endo/Heme/Allergies:  Does not bruise/bleed easily.  Psychiatric/Behavioral:  Negative for depression and suicidal ideas. The patient does not have insomnia.       Allergies  Allergen Reactions   Codeine  Other (See Comments)    Severe constipation   Losartan Other (See Comments)    Sick on stomach   Lovastatin Other (See Comments)    Per pt headache   Sulfa Antibiotics Hives and Rash     Past Medical History:  Diagnosis Date   Breast cancer (HCC) 2016   left   Cancer (HCC)    skin    Carcinoma of left breast (HCC) 07/30/2014   1.4 cm, T1c, N0, ER negative, PR negative, HER-2 positive   Chronic kidney disease    h/o renal insuff   Complication of anesthesia 2004   oversensitive to noise, lights   COPD (chronic obstructive pulmonary disease) (HCC)    Depression 1995   GERD (gastroesophageal reflux disease)    Hyperlipidemia  Hypertension    Nausea    Personal history of chemotherapy    Personal history of radiation therapy 2016   LEFT lumpectomy     Past Surgical History:  Procedure Laterality Date   ABDOMINAL HYSTERECTOMY     AXILLARY LYMPH NODE BIOPSY Left 08/03/2014   Procedure: AXILLARY LYMPH NODE BIOPSY;  Surgeon: Marshall Skeeter, MD;  Location: [facility]  ORS;  Service: General;  Laterality: Left;   BACK SURGERY     BREAST BIOPSY Left    BREAST BIOPSY Right 12/31/2021   u/s bx, 9:00 retro, RIBBON (ribbon did not match/heart clip placed)-SINGLE FOCUS OF FLORID DUCTAL HYPERPLASIA   BREAST BIOPSY Right 01/15/2022   Right Breast Stereo Bx, X clip ,USUAL DUCTAL EPITHELIAL HYPERPLASIA.   BREAST BIOPSY Right 01/15/2022   MM RT BREAST BX W LOC DEV 1ST LESION IMAGE BX SPEC STEREO GUIDE 01/15/2022 [facility]   BREAST CYST ASPIRATION Right 11/14/2020   u/s asp 9:00 retro   BREAST LUMPECTOMY Left 2016   INVASIVE DUCTAL CARCINOMA & [facility] neg margins    BREAST LUMPECTOMY WITH AXILLARY LYMPH NODE DISSECTION Left 08/03/2014   Procedure: BREAST LUMPECTOMY WITH AXILLARY LYMPH NODE DISSECTION;  Surgeon: Marshall Skeeter, MD;  Location: [facility] ORS;  Service: General;  Laterality: Left;   BREAST SURGERY Left 08/03/2014   Wide excision, sentinel node biopsy.   CHOLECYSTECTOMY     COLONOSCOPY  2013   Dr. Felicita Horns   COLONOSCOPY WITH PROPOFOL  N/A 05/30/2015   Procedure: COLONOSCOPY WITH PROPOFOL ;  Surgeon: Cassie Click, MD;  Location: [facility] ENDOSCOPY;  Service: Endoscopy;  Laterality: N/A;   DILATION AND CURETTAGE OF UTERUS N/A 01/16/2017   Procedure: DILATATION AND CURETTAGE;  Surgeon: Ward, Margarie Shay, MD;  Location: [facility] ORS;  Service: Gynecology;  Laterality: N/A;   ESOPHAGOGASTRODUODENOSCOPY (EGD) WITH PROPOFOL  N/A 10/28/2019   Procedure: ESOPHAGOGASTRODUODENOSCOPY (EGD) WITH PROPOFOL ;  Surgeon: Shane Darling, MD;  Location: [facility] ENDOSCOPY;  Service: Endoscopy;  Laterality: N/A;   LAPAROSCOPIC BILATERAL SALPINGO OOPHERECTOMY Bilateral 02/06/2017   Procedure: LAPAROSCOPIC BILATERAL SALPINGO OOPHORECTOMY;  Surgeon: Ward, Margarie Shay, MD;  Location: [facility] ORS;  Service: Gynecology;  Laterality: Bilateral;   LAPAROSCOPIC HYSTERECTOMY N/A 02/06/2017   Procedure: HYSTERECTOMY TOTAL LAPAROSCOPIC;  Surgeon: Ward, Margarie Shay, MD;  Location: [facility] ORS;  Service:  Gynecology;  Laterality: N/A;   OOPHORECTOMY     PORTACATH PLACEMENT Right 08/31/2014   Procedure: INSERTION PORT-A-CATH;  Surgeon: Marshall Skeeter, MD;  Location: [facility] ORS;  Service: General;  Laterality: Right;   REDUCTION MAMMAPLASTY Bilateral 2016   SENTINEL NODE BIOPSY Left 08/03/2014   Procedure: SENTINEL NODE BIOPSY;  Surgeon: Marshall Skeeter, MD;  Location: [facility] ORS;  Service: General;  Laterality: Left;   skin cancer  removal  2015   nose    TONSILLECTOMY      Social History   Socioeconomic History   Marital status: Widowed    Spouse name: Not on file   Number of children: Not on file   Years of education: Not on file   Highest education level: Not on file  Occupational History   Not on file  Tobacco Use   Smoking status: Former    Current packs/day: 0.00    Average packs/day: 1 pack/day for 2.0 years (2.0 ttl pk-yrs)    Types: Cigarettes    Start date: 07/31/1974    Quit date: 07/30/1976    Years since quitting: 47.0   Smokeless tobacco: Never  Vaping Use   Vaping status: Never Used  Substance and Sexual Activity   Alcohol  use: Yes    Alcohol /week: 0.0 - 2.0 standard drinks of alcohol    Drug use: No   Sexual activity: Not on file  Other Topics Concern   Not on file  Social History Narrative   Not on file   Social Drivers of Health   Financial Resource Strain: Low Risk  (04/27/2023)   Received from [facility]   Overall Financial Resource Strain (CARDIA)    Difficulty of Paying Living Expenses: Not very hard  Food Insecurity: No Food Insecurity (04/27/2023)   Received from [facility]   Hunger Vital Sign    Worried About Running Out of Food in the Last Year: Never true    Ran Out of Food in the Last Year: Never true  Transportation Needs: No Transportation Needs (04/27/2023)   Received from [facility] - Transportation    In the past 12 months, has lack of transportation kept you from  medical appointments or from getting medications?: No    Lack of Transportation (Non-Medical): No  Physical Activity: Not on file  Stress: Not on file  Social Connections: Not on file  Intimate Partner Violence: Not on file    Family History  Problem Relation Age of Onset   Breast cancer Mother 67             Current Outpatient Medications:    acetaminophen  (TYLENOL ) 500 MG tablet, Take 500 mg by mouth every 6 (six) hours as needed., Disp: , Rfl:    albuterol  (PROVENTIL  HFA;VENTOLIN  HFA) 108 (90 Base) MCG/ACT inhaler, Inhale 2 puffs into the lungs every 6 (six) hours as needed for wheezing or shortness of breath., Disp: 1 Inhaler, Rfl: 2   ARNUITY ELLIPTA 100 MCG/ACT AEPB, Take 1 puff by mouth 2 (two) times daily., Disp: , Rfl:    aspirin  EC 81 MG tablet, Take 81 mg daily by mouth. , Disp: , Rfl:    cholecalciferol  (VITAMIN D ) 1000 units tablet, Take 1,000 Units by mouth daily., Disp: , Rfl:    docusate sodium  (COLACE) 100 MG capsule, Take 100 mg by mouth 2 (two) times daily., Disp: , Rfl:    gabapentin  (NEURONTIN ) 100 MG capsule, TAKE 1 CAPSULE BY MOUTH THREE TIMES DAILY (Patient taking differently: Take 100 mg by mouth 2 (two) times daily.), Disp: 90 capsule, Rfl: 1   gabapentin  (NEURONTIN ) 100 MG capsule, Take by mouth., Disp: , Rfl:    magnesium  oxide (MAG-OX) 400 MG tablet, Take 1 tablet by mouth 2 (two) times daily., Disp: , Rfl:    omeprazole (PRILOSEC) 20 MG capsule, Take 20 mg by mouth daily., Disp: , Rfl:    Polyethyl Glycol-Propyl Glycol 0.4-0.3 % SOLN, Place 1 drop into both eyes as needed (for dry eyes)., Disp: , Rfl:    pravastatin  (PRAVACHOL ) 40 MG tablet, Take 40 mg at bedtime by mouth. , Disp: , Rfl: 3   pravastatin  (PRAVACHOL ) 40 MG tablet, Take by mouth., Disp: , Rfl:    Probiotic Product (PROBIOTIC-10 PO), Take by mouth., Disp: , Rfl:    spironolactone  (ALDACTONE ) 25 MG tablet, Take 25 mg by mouth 2 (two) times daily. , Disp: , Rfl:    spironolactone  (ALDACTONE ) 25  MG tablet, Take 1 tablet by mouth 2 (two) times daily., Disp: , Rfl:    amoxicillin -clavulanate (AUGMENTIN ) 875-125 MG tablet, Take 1 tablet by mouth 2 (two) times daily., Disp: 20 tablet, Rfl: 0   benzonatate (TESSALON)  200 MG capsule, TAKE 1 CAPSULE BY MOUTH THREE TIMES DAILY AS NEEDED FOR COUGH FOR UP TO 7 DAYS (Patient not taking: No sig reported), Disp: , Rfl:    citalopram  (CELEXA ) 20 MG tablet, TAKE 1 TABLET (20 MG TOTAL) BY MOUTH DAILY. (Patient not taking: Reported on 07/07/2022), Disp: 30 tablet, Rfl: 5   fluticasone  (FLOVENT  HFA) 44 MCG/ACT inhaler, Inhale into the lungs., Disp: , Rfl:    fluticasone  (FLOVENT  HFA) 44 MCG/ACT inhaler, Inhale into the lungs., Disp: , Rfl:    HYDROcodone -acetaminophen  (NORCO/VICODIN) 5-325 MG tablet, Take 1 tablet by mouth every 6 (six) hours as needed. (Patient not taking: Reported on 07/07/2022), Disp: , Rfl:    levofloxacin (LEVAQUIN) 500 MG tablet, Take 500 mg by mouth daily. (Patient not taking: Reported on 10/17/2020), Disp: , Rfl:    Multiple Vitamin (MULTIVITAMIN) tablet, Take 2 tablets daily by mouth.  (Patient not taking: Reported on 04/29/2021), Disp: , Rfl:    NON FORMULARY, Apply 1 application 2 (two) times daily topically. Unscented Free-up Professional Massage Cream Mixed with Vernida Goodie (Patient not taking: Reported on 04/29/2021), Disp: , Rfl:    ondansetron  (ZOFRAN -ODT) 4 MG disintegrating tablet, Take 4 mg by mouth every 8 (eight) hours as needed. (Patient not taking: Reported on 10/17/2020), Disp: , Rfl:    predniSONE  (DELTASONE ) 20 MG tablet, Take 40 mg by mouth daily. (Patient not taking: Reported on 10/17/2020), Disp: , Rfl:    promethazine -dextromethorphan (PROMETHAZINE -DM) 6.25-15 MG/5ML syrup, Take by mouth. (Patient not taking: Reported on 10/17/2020), Disp: , Rfl:    tiZANidine (ZANAFLEX) 2 MG tablet, Take by mouth., Disp: , Rfl:  No current facility-administered medications for this visit.  Facility-Administered Medications Ordered in Other  Visits:    0.9 %  sodium chloride  infusion, , Intravenous, Continuous, Cassie Click, MD   diphenhydrAMINE  (BENADRYL ) capsule 25 mg, 25 mg, Oral, Once, Choksi, Janak, MD   sodium chloride  0.9 % injection 10 mL, 10 mL, Intravenous, PRN, Choksi, Janak, MD, 10 mL at 09/25/14 1418   sodium chloride  0.9 % injection 10 mL, 10 mL, Intravenous, PRN, Choksi, Janak, MD, 10 mL at 01/15/15 1325   sodium chloride  flush (NS) 0.9 % injection 10 mL, 10 mL, Intravenous, PRN, Beverely Buba, Melissa C, MD, 10 mL at 07/22/16 1011  Physical exam:  Vitals:   08/17/23 1425  BP: 127/70  Pulse: 77  Resp: 17  Temp: (!) 97.4 F (36.3 C)  TempSrc: Tympanic  SpO2: 97%  Weight: 245 lb (111.1 kg)   Physical Exam Cardiovascular:     Rate and Rhythm: Normal rate and regular rhythm.     Heart sounds: Normal heart sounds.  Pulmonary:     Effort: Pulmonary effort is normal.     Breath sounds: Normal breath sounds.  Abdominal:     General: Bowel sounds are normal.     Palpations: Abdomen is soft.  Skin:    General: Skin is warm and dry.  Neurological:     Mental Status: She is alert and oriented to person, place, and time.    Breast exam was performed in seated and lying down position. Patient is status post left lumpectomy with a well-healed surgical scar. No evidence of any palpable masses. No evidence of axillary adenopathy. No evidence of any palpable masses or lumps in the right breast. No evidence of right axillary adenopathy   I have personally reviewed labs listed below:    Latest Ref Rng & Units 10/17/2020   10:11 AM  CMP  Glucose  70 - 99 mg/dL 161   BUN 8 - 23 mg/dL 14   Creatinine 0.96 - 1.00 mg/dL 0.45   Sodium 409 - 811 mmol/L 136   Potassium 3.5 - 5.1 mmol/L 3.7   Chloride 98 - 111 mmol/L 105   CO2 22 - 32 mmol/L 25   Calcium 8.9 - 10.3 mg/dL 8.8   Total Protein 6.5 - 8.1 g/dL 7.0   Total Bilirubin 0.3 - 1.2 mg/dL 1.4   Alkaline Phos 38 - 126 U/L 44   AST 15 - 41 U/L 22   ALT 0 - 44 U/L  19       Latest Ref Rng & Units 10/17/2020   10:11 AM  CBC  WBC 4.0 - 10.5 K/uL 5.7   Hemoglobin 12.0 - 15.0 g/dL 91.4   Hematocrit 78.2 - 46.0 % 41.7   Platelets 150 - 400 K/uL 195      Assessment and plan- Patient is a 73 y.o. female with history of ER/PR negative HER2 positive right breast cancer in 2016 s/p lumpectomy, adjuvant chemotherapy and radiation treatment.  She is here for routine surveillance visit for breast cancer  Patient is now 9 years out of her diagnosis of breast cancer.  Clinically she is doing well with no signs and symptoms of recurrence based on today's exam.  I will see her back in 1 year no labs.  I will schedule her mammogram for October 2025.  Symptoms of nausea and abdominal cramping seem to be unrelated To her prior history of breast cancer.  She has undergone EGD and colonoscopy by Dr. Felicita Horns in the past and I will refer her to [facility] for evaluation of the symptoms.   Visit Diagnosis 1. Abdominal cramping   2. Encounter for follow-up surveillance of breast cancer      Dr. Seretha Dance, MD, MPH [facility] at [facility] [phone] 08/17/2023 3:56 PM

## 2023-08-27 ENCOUNTER — Telehealth: Payer: Self-pay

## 2023-08-27 NOTE — Telephone Encounter (Signed)
 Spoke to [place] at [facility] who indicated the patient is scheduled for an appointment on Tuesday 12/29/23 at 10:30am.  No further follow up needed.

## 2023-12-08 ENCOUNTER — Other Ambulatory Visit: Payer: Medicare (Managed Care)

## 2024-01-12 ENCOUNTER — Ambulatory Visit
Admission: RE | Admit: 2024-01-12 | Discharge: 2024-01-12 | Disposition: A | Discharge status: Home / Self Care | Payer: Medicare (Managed Care) | Source: Ambulatory Visit | Attending: Family Medicine | Admitting: Family Medicine

## 2024-01-12 DIAGNOSIS — Z780 Asymptomatic menopausal state: Secondary | ICD-10-CM | POA: Diagnosis present

## 2024-08-16 ENCOUNTER — Ambulatory Visit: Payer: Medicare (Managed Care) | Admitting: Oncology
